# Patient Record
Sex: Male | Born: 1950
Health system: Southern US, Community
[De-identification: ages and names within clinical notes are randomized; demographics above are authoritative.]

## PROBLEM LIST (undated history)

## (undated) DIAGNOSIS — K4090 Unilateral inguinal hernia, without obstruction or gangrene, not specified as recurrent: Secondary | ICD-10-CM

## (undated) DIAGNOSIS — M1990 Unspecified osteoarthritis, unspecified site: Secondary | ICD-10-CM

## (undated) DIAGNOSIS — F329 Major depressive disorder, single episode, unspecified: Secondary | ICD-10-CM

## (undated) DIAGNOSIS — Z87442 Personal history of urinary calculi: Secondary | ICD-10-CM

## (undated) DIAGNOSIS — M791 Myalgia, unspecified site: Secondary | ICD-10-CM

## (undated) DIAGNOSIS — F32A Depression, unspecified: Secondary | ICD-10-CM

## (undated) DIAGNOSIS — J449 Chronic obstructive pulmonary disease, unspecified: Secondary | ICD-10-CM

## (undated) DIAGNOSIS — E785 Hyperlipidemia, unspecified: Secondary | ICD-10-CM

## (undated) DIAGNOSIS — I7300 Raynaud's syndrome without gangrene: Secondary | ICD-10-CM

## (undated) DIAGNOSIS — C801 Malignant (primary) neoplasm, unspecified: Secondary | ICD-10-CM

## (undated) DIAGNOSIS — F419 Anxiety disorder, unspecified: Secondary | ICD-10-CM

## (undated) DIAGNOSIS — G2581 Restless legs syndrome: Secondary | ICD-10-CM

## (undated) DIAGNOSIS — G4700 Insomnia, unspecified: Secondary | ICD-10-CM

## (undated) HISTORY — DX: Insomnia, unspecified: G47.00

## (undated) HISTORY — PX: KNEE SURGERY: SHX244

## (undated) HISTORY — DX: Raynaud's syndrome without gangrene: I73.00

## (undated) HISTORY — DX: Anxiety disorder, unspecified: F41.9

## (undated) HISTORY — DX: Depression, unspecified: F32.A

## (undated) HISTORY — PX: KYPHOPLASTY: SHX5884

## (undated) HISTORY — DX: Hyperlipidemia, unspecified: E78.5

## (undated) HISTORY — DX: Unspecified osteoarthritis, unspecified site: M19.90

## (undated) HISTORY — PX: TONSILLECTOMY: SUR1361

## (undated) HISTORY — PX: COLON SURGERY: SHX602

## (undated) HISTORY — DX: Major depressive disorder, single episode, unspecified: F32.9

## (undated) HISTORY — DX: Myalgia, unspecified site: M79.10

---

## 1977-02-27 DIAGNOSIS — R7611 Nonspecific reaction to tuberculin skin test without active tuberculosis: Secondary | ICD-10-CM

## 1977-02-27 HISTORY — DX: Nonspecific reaction to tuberculin skin test without active tuberculosis: R76.11

## 1978-02-27 DIAGNOSIS — D869 Sarcoidosis, unspecified: Secondary | ICD-10-CM

## 1978-02-27 HISTORY — DX: Sarcoidosis, unspecified: D86.9

## 1978-02-27 HISTORY — PX: AXILLARY LYMPH NODE BIOPSY: SHX5737

## 2006-03-15 ENCOUNTER — Ambulatory Visit (HOSPITAL_COMMUNITY): Admission: RE | Admit: 2006-03-15 | Discharge: 2006-03-15 | Payer: Self-pay | Admitting: Pulmonary Disease

## 2006-04-11 ENCOUNTER — Ambulatory Visit (HOSPITAL_BASED_OUTPATIENT_CLINIC_OR_DEPARTMENT_OTHER): Admission: RE | Admit: 2006-04-11 | Discharge: 2006-04-11 | Payer: Self-pay | Admitting: Orthopedic Surgery

## 2008-04-21 ENCOUNTER — Ambulatory Visit (HOSPITAL_COMMUNITY): Admission: RE | Admit: 2008-04-21 | Discharge: 2008-04-21 | Payer: Self-pay | Admitting: Pulmonary Disease

## 2009-08-13 ENCOUNTER — Ambulatory Visit (HOSPITAL_COMMUNITY): Admission: RE | Admit: 2009-08-13 | Discharge: 2009-08-13 | Payer: Self-pay | Admitting: Pulmonary Disease

## 2009-11-15 ENCOUNTER — Ambulatory Visit (HOSPITAL_COMMUNITY): Admission: RE | Admit: 2009-11-15 | Discharge: 2009-11-15 | Payer: Self-pay | Admitting: Pulmonary Disease

## 2010-06-26 ENCOUNTER — Emergency Department (HOSPITAL_COMMUNITY): Payer: BC Managed Care – PPO

## 2010-06-26 ENCOUNTER — Emergency Department (HOSPITAL_COMMUNITY)
Admission: EM | Admit: 2010-06-26 | Discharge: 2010-06-26 | Disposition: A | Discharge status: Home / Self Care | Payer: BC Managed Care – PPO | Attending: Emergency Medicine | Admitting: Emergency Medicine

## 2010-06-26 DIAGNOSIS — I498 Other specified cardiac arrhythmias: Secondary | ICD-10-CM | POA: Insufficient documentation

## 2010-06-26 DIAGNOSIS — R109 Unspecified abdominal pain: Secondary | ICD-10-CM | POA: Insufficient documentation

## 2010-06-26 DIAGNOSIS — R112 Nausea with vomiting, unspecified: Secondary | ICD-10-CM | POA: Insufficient documentation

## 2010-06-26 DIAGNOSIS — N201 Calculus of ureter: Secondary | ICD-10-CM | POA: Insufficient documentation

## 2010-06-26 LAB — COMPREHENSIVE METABOLIC PANEL
ALT: 28 U/L (ref 0–53)
Albumin: 4 g/dL (ref 3.5–5.2)
Alkaline Phosphatase: 86 U/L (ref 39–117)
BUN: 17 mg/dL (ref 6–23)
CO2: 28 mEq/L (ref 19–32)
Chloride: 100 mEq/L (ref 96–112)
Creatinine, Ser: 0.97 mg/dL (ref 0.4–1.5)
Glucose, Bld: 134 mg/dL — ABNORMAL HIGH (ref 70–99)

## 2010-06-26 LAB — DIFFERENTIAL
Basophils Absolute: 0 10*3/uL (ref 0.0–0.1)
Eosinophils Absolute: 0.1 10*3/uL (ref 0.0–0.7)
Eosinophils Relative: 1 % (ref 0–5)
Monocytes Relative: 8 % (ref 3–12)
Neutrophils Relative %: 71 % (ref 43–77)

## 2010-06-26 LAB — CBC
HCT: 42.6 % (ref 39.0–52.0)
Hemoglobin: 14.5 g/dL (ref 13.0–17.0)
MCH: 28.5 pg (ref 26.0–34.0)
Platelets: 243 10*3/uL (ref 150–400)
RBC: 5.09 MIL/uL (ref 4.22–5.81)
WBC: 10.2 10*3/uL (ref 4.0–10.5)

## 2010-06-26 LAB — URINALYSIS, ROUTINE W REFLEX MICROSCOPIC
Bilirubin Urine: NEGATIVE
Leukocytes, UA: NEGATIVE
Nitrite: NEGATIVE
Protein, ur: NEGATIVE mg/dL
Specific Gravity, Urine: 1.01 (ref 1.005–1.030)

## 2010-06-26 LAB — URINE MICROSCOPIC-ADD ON

## 2010-06-26 LAB — PROTIME-INR: INR: 0.97 (ref 0.00–1.49)

## 2010-06-26 MED ORDER — IOHEXOL 300 MG/ML  SOLN
100.0000 mL | Freq: Once | INTRAMUSCULAR | Status: AC | PRN
  Administered 2010-06-26: 100 mL via INTRAVENOUS

## 2010-06-28 LAB — URINE CULTURE
Colony Count: 2000
Culture  Setup Time: 201204292050

## 2010-07-15 NOTE — Op Note (Signed)
NAMEEUFEMIO, STRAHM                ACCOUNT NO.:  [id number]   MEDICAL RECORD NO.:  [id number]          PATIENT TYPE:  AMB   LOCATION:  DSC                          FACILITY:  [facility]   PHYSICIAN:  Loreta Ave, M.D. DATE OF BIRTH:  1950/10/02   DATE OF PROCEDURE:  04/11/2006  DATE OF DISCHARGE:                               OPERATIVE REPORT   PREOPERATIVE DIAGNOSIS:  Medial meniscus tear, right knee.   POSTOPERATIVE DIAGNOSIS:  Medial and lateral meniscus tear, right knee  with some Grade 2, mild Grade 3 changes lateral femoral condyle.   PROCEDURE:  1. Right knee exam under anesthesia.  2. Arthroscopy.  3. Partial medial and lateral meniscectomy.  4. Chondroplasty, lateral femoral condyle.   SURGEON:  Loreta Ave, M.D.   ASSISTANT:  Genene Churn. Denton Meek.   ANESTHESIA:  General.   BLOOD LOSS:  Minimal.   TOURNIQUET:  None applied.   SPECIMENS:  None.   CULTURES:  None.   COMPLICATIONS:  None.   DRESSING:  Soft compressive.   PROCEDURE:  Patient brought to the operating room, placed on the  operating table in supine position.  After adequate anesthesia had been  obtained, right knee examined.  Full motion, good stability, good  patellofemoral tracking.  Tourniquet let go and not applied.  Leg  prepped and leg draped.  Three portals made, one superolateral, one each  medial and lateral parapatellar.  Inflow catheter induced.  Knee was  extended, arthroscope introduced.  The knee inspected.  __________  cartilage throughout, other than this some mild grade 2, even a little  superficial grade 3 change, weight bearing on the lateral femoral  condyle debrided.  The plateau looked good.  Cruciate ligament was  intact.  Radial tearing midline anterior third lateral meniscus was all  spliced out the tibia then smoothed with basket and shaver.  Marked  complex tearing at entire posterior medial meniscus treated with removal  of the posterior third tapering the  remaining meniscus.  Medial  compartment, no degenerative changes.  At completion all  __________  examined to be sure all loose fragments removed.  Instruments fully  removed.  Portals and knee injected with Marcaine.  Portals closed with  4-0 Nylon.  Sterile compressive dressing applied.  Anesthesia reversed.  Brought to the recovery room.  Tolerated surgery well.  No  complications.      Loreta Ave, M.D.  Electronically Signed     DFM/MEDQ  D:  04/11/2006  T:  04/12/2006  Job:  [id number]

## 2011-01-27 ENCOUNTER — Other Ambulatory Visit: Payer: Self-pay

## 2011-01-27 DIAGNOSIS — Z139 Encounter for screening, unspecified: Secondary | ICD-10-CM

## 2011-01-31 ENCOUNTER — Telehealth: Payer: Self-pay

## 2011-01-31 NOTE — Telephone Encounter (Signed)
Needs office visit given multiple psychoactive meds prior to colonoscopy.

## 2011-01-31 NOTE — Telephone Encounter (Signed)
Gastroenterology Pre-Procedure Form  Request Date: 01/27/2011     Requesting Physician: Dr. Juanetta Gosling     PATIENT INFORMATION:  Albert Gonzalez is a 59 y.o., male (DOB=03-19-1950).  PROCEDURE: Procedure(s) requested: colonoscopy Procedure Reason: screening for colon cancer  PATIENT REVIEW QUESTIONS: The patient reports the following:   1. Diabetes Melitis: no 2. Joint replacements in the past 12 months: no 3. Major health problems in the past 3 months: no 4. Has an artificial valve or MVP:no 5. Has been advised in past to take antibiotics in advance of a procedure like teeth cleaning: no}    MEDICATIONS & ALLERGIES:    Patient reports the following regarding taking any blood thinners:   Plavix? no Aspirin?no Coumadin?  no  Patient confirms/reports the following medications:  Current Outpatient Prescriptions  Medication Sig Dispense Refill  . ALPRAZolam (XANAX) 0.5 MG tablet Take 0.5 mg by mouth at bedtime as needed. Not daily       . desvenlafaxine (PRISTIQ) 50 MG 24 hr tablet Take 50 mg by mouth daily.        Marland Kitchen glucosamine-chondroitin 500-400 MG tablet Take 1 tablet by mouth daily.        Marland Kitchen HYDROcodone-acetaminophen (VICODIN) 5-500 MG per tablet Take 1 tablet by mouth every 6 (six) hours as needed. Pt said he does not take daily       . Multiple Vitamin (MULTIVITAMIN) tablet Take 1 tablet by mouth daily.        . rosuvastatin (CRESTOR) 20 MG tablet Take 20 mg by mouth daily.        Marland Kitchen zolpidem (AMBIEN) 10 MG tablet Take 10 mg by mouth at bedtime as needed. As needed only         Patient confirms/reports the following allergies:  Allergies  Allergen Reactions  . Sulfa Antibiotics Rash    Patient is appropriate to schedule for requested procedure(s): yes  AUTHORIZATION INFORMATION Primary Insurance:  ID #:  Group #:  Pre-Cert / Auth required: Pre-Cert / Auth #:   Secondary Insurance:   ID #:  Group #:  Pre-Cert / Auth required:  Pre-Cert / Auth #:  No orders of the  defined types were placed in this encounter.    SCHEDULE INFORMATION: Procedure has been scheduled as follows:  Date: 02/08/2011     Time: 1:30 PM  Location: [facility] Short Stay  This Gastroenterology Pre-Precedure Form is being routed to the following provider(s) for review: R. Roetta Sessions, MD

## 2011-01-31 NOTE — Telephone Encounter (Signed)
Per Lorenza Burton, NP scheduled pt OV appt with Tana Coast, PA on 02/01/2011 at 10:30 AM. (left the appt in computer for 02/08/11  For the colonoscopy. Pt will just need Rx and instructions.

## 2011-02-01 ENCOUNTER — Encounter: Payer: Self-pay | Admitting: Gastroenterology

## 2011-02-01 ENCOUNTER — Ambulatory Visit (INDEPENDENT_AMBULATORY_CARE_PROVIDER_SITE_OTHER): Payer: BC Managed Care – PPO | Admitting: Gastroenterology

## 2011-02-01 VITALS — BP 95/65 | HR 71 | Temp 97.9°F | Ht 71.0 in | Wt 192.8 lb

## 2011-02-01 DIAGNOSIS — K5900 Constipation, unspecified: Secondary | ICD-10-CM | POA: Insufficient documentation

## 2011-02-01 DIAGNOSIS — R933 Abnormal findings on diagnostic imaging of other parts of digestive tract: Secondary | ICD-10-CM | POA: Insufficient documentation

## 2011-02-01 NOTE — Patient Instructions (Signed)
We have scheduled you for a colonoscopy. Please see separate instructions. 

## 2011-02-01 NOTE — Progress Notes (Signed)
Primary Care Physician:  Fredirick Maudlin, MD  Primary Gastroenterologist:  Roetta Sessions, MD   Chief Complaint  Patient presents with  . Colonoscopy    HPI:  Albert Gonzalez is a 60 y.o. male here to schedule a colonoscopy. In 05/2010, he had CT scan for abdominal pain. Turned out he had kidney stone but on CT he had density in the vicinity of the ileocecal valve, ?stool vs polyp.mass? He had to put off having colonoscopy due to wife's illness.   C/O chronic constipation but well-managed on daily fiber.  BM every 2-3 days, stool soft. No melena, brbpr. Appetite good, no weight loss. Only occasional heartburn. No dysphagia. No abdominal pain. No vomiting. Never had a colonoscopy. No FH of colon cancer.   Current Outpatient Prescriptions  Medication Sig Dispense Refill  . ALPRAZolam (XANAX) 0.5 MG tablet Take 0.5 mg by mouth at bedtime as needed. Not daily       . aspirin 325 MG tablet Take 325 mg by mouth daily.        . cyclobenzaprine (FLEXERIL) 10 MG tablet Take 10 mg by mouth 3 (three) times daily as needed.        . desvenlafaxine (PRISTIQ) 50 MG 24 hr tablet Take 50 mg by mouth daily.        Marland Kitchen glucosamine-chondroitin 500-400 MG tablet Take 1 tablet by mouth daily.        Marland Kitchen HYDROcodone-acetaminophen (VICODIN) 5-500 MG per tablet Take 1 tablet by mouth every 6 (six) hours as needed. Patient states he rarely takes.      . Multiple Vitamin (MULTIVITAMIN) tablet Take 1 tablet by mouth daily.        . rosuvastatin (CRESTOR) 20 MG tablet Take 20 mg by mouth daily.        Marland Kitchen zolpidem (AMBIEN) 10 MG tablet Take 10 mg by mouth at bedtime as needed. As needed only         Allergies as of 02/01/2011 - Review Complete 02/01/2011  Allergen Reaction Noted  . Sulfa antibiotics Rash 01/31/2011    Past Medical History  Diagnosis Date  . Hyperlipidemia   . Anxiety   . Depression   . Insomnia   . Myalgia   . Arthritis     Past Surgical History  Procedure Date  . Tonsillectomy   . Knee  surgery     left    Family History  Problem Relation Age of Onset  . Colon cancer Neg Hx   . Liver disease Neg Hx   . Inflammatory bowel disease Neg Hx     History   Social History  . Marital Status: Married    Spouse Name: N/A    Number of Children: 1  . Years of Education: N/A   Occupational History  . respiratory therapist [employer]   Social History Main Topics  . Smoking status: Former Smoker -- 0.5 packs/day    Types: Pipe  . Smokeless tobacco: Not on file   Comment: quit many years ago  . Alcohol Use: No  . Drug Use: No  . Sexually Active: Not on file   Other Topics Concern  . Not on file   Social History Narrative  . No narrative on file      ROS:  General: Negative for anorexia, weight loss, fever, chills, fatigue, weakness. Eyes: Negative for vision changes.  ENT: Negative for hoarseness, difficulty swallowing , nasal congestion. CV: Negative for chest pain, angina, palpitations, dyspnea on exertion,  peripheral edema.  Respiratory: Negative for dyspnea at rest, dyspnea on exertion, cough, sputum, wheezing.  GI: See history of present illness. GU:  Negative for dysuria, hematuria, urinary incontinence, urinary frequency, nocturnal urination.  MS: Negative for joint pain, low back pain.  Derm: Negative for rash or itching.  Neuro: Negative for weakness, abnormal sensation, seizure, frequent headaches, memory loss, confusion.  Psych: Positive for anxiety, depression but NO suicidal ideation, hallucinations.  Endo: Negative for unusual weight change.  Heme: Negative for bruising or bleeding. Allergy: Negative for rash or hives.    Physical Examination:  BP 95/65  Pulse 71  Temp(Src) 97.9 F (36.6 C) (Temporal)  Ht 5\' 11"  (1.803 m)  Wt 192 lb 12.8 oz (87.454 kg)  BMI 26.89 kg/m2   General: Well-nourished, well-developed in no acute distress.  Head: Normocephalic, atraumatic.   Eyes: Conjunctiva pink, no icterus. Mouth:  Oropharyngeal mucosa moist and pink , no lesions erythema or exudate. Neck: Supple without thyromegaly, masses, or lymphadenopathy.  Lungs: Clear to auscultation bilaterally.  Heart: Regular rate and rhythm, no murmurs rubs or gallops.  Abdomen: Bowel sounds are normal, nontender, nondistended, no hepatosplenomegaly or masses, no abdominal bruits or    hernia , no rebound or guarding.   Rectal: Not performed. Extremities: No lower extremity edema. No clubbing or deformities.  Neuro: Alert and oriented x 4 , grossly normal neurologically.  Skin: Warm and dry, no rash or jaundice.   Psych: Alert and cooperative, normal mood and affect.

## 2011-02-02 NOTE — Assessment & Plan Note (Signed)
Continue daily fiber. If needed, can add Miralax 17 grams daily prn.

## 2011-02-02 NOTE — Progress Notes (Signed)
Cc to PCP 

## 2011-02-02 NOTE — Assessment & Plan Note (Signed)
Chronic constipation managed on daily fiber. CT abd/pelvis in 05/2010 showed ?abnormality at ileocecal valve. Patient to undergo colonoscopy for further evaluation.  I have discussed the risks, alternatives, benefits with regards to but not limited to the risk of reaction to medication, bleeding, infection, perforation and the patient is agreeable to proceed. Written consent to be obtained.  Please note. After inquiring multiple times, patient states he rarely takes Vicodin. He takes one Xanax daily and Prestiq once daily. Trial of conscious sedation as per discussion with patient.

## 2011-02-03 NOTE — Progress Notes (Signed)
DISCUSS W/ DR. [name].  REVIEWED.

## 2011-02-06 ENCOUNTER — Encounter (HOSPITAL_COMMUNITY): Payer: Self-pay | Admitting: Pharmacy Technician

## 2011-02-07 MED ORDER — SODIUM CHLORIDE 0.45 % IV SOLN
Freq: Once | INTRAVENOUS | Status: AC
  Administered 2011-02-08: 1000 mL via INTRAVENOUS

## 2011-02-08 ENCOUNTER — Encounter (HOSPITAL_COMMUNITY): Payer: Self-pay | Admitting: *Deleted

## 2011-02-08 ENCOUNTER — Ambulatory Visit (HOSPITAL_COMMUNITY)
Admission: RE | Admit: 2011-02-08 | Discharge: 2011-02-08 | Disposition: A | Discharge status: Home / Self Care | Payer: BC Managed Care – PPO | Source: Ambulatory Visit | Attending: Internal Medicine | Admitting: Internal Medicine

## 2011-02-08 ENCOUNTER — Other Ambulatory Visit: Payer: Self-pay | Admitting: Internal Medicine

## 2011-02-08 ENCOUNTER — Encounter (HOSPITAL_COMMUNITY)
Admission: RE | Disposition: A | Discharge status: Home / Self Care | Payer: Self-pay | Source: Ambulatory Visit | Attending: Internal Medicine

## 2011-02-08 DIAGNOSIS — Z1211 Encounter for screening for malignant neoplasm of colon: Secondary | ICD-10-CM | POA: Insufficient documentation

## 2011-02-08 DIAGNOSIS — K5730 Diverticulosis of large intestine without perforation or abscess without bleeding: Secondary | ICD-10-CM

## 2011-02-08 DIAGNOSIS — D126 Benign neoplasm of colon, unspecified: Secondary | ICD-10-CM

## 2011-02-08 DIAGNOSIS — R933 Abnormal findings on diagnostic imaging of other parts of digestive tract: Secondary | ICD-10-CM

## 2011-02-08 DIAGNOSIS — Z139 Encounter for screening, unspecified: Secondary | ICD-10-CM

## 2011-02-08 DIAGNOSIS — E785 Hyperlipidemia, unspecified: Secondary | ICD-10-CM | POA: Insufficient documentation

## 2011-02-08 DIAGNOSIS — Z7982 Long term (current) use of aspirin: Secondary | ICD-10-CM | POA: Insufficient documentation

## 2011-02-08 HISTORY — PX: COLONOSCOPY: SHX5424

## 2011-02-08 SURGERY — COLONOSCOPY
Anesthesia: Moderate Sedation

## 2011-02-08 MED ORDER — MIDAZOLAM HCL 5 MG/5ML IJ SOLN
INTRAMUSCULAR | Status: DC | PRN
  Administered 2011-02-08 (×2): 2 mg via INTRAVENOUS
  Administered 2011-02-08: 1 mg via INTRAVENOUS

## 2011-02-08 MED ORDER — MEPERIDINE HCL 100 MG/ML IJ SOLN
INTRAMUSCULAR | Status: AC
  Filled 2011-02-08: qty 2

## 2011-02-08 MED ORDER — STERILE WATER FOR IRRIGATION IR SOLN
Status: DC | PRN
  Administered 2011-02-08: 14:00:00

## 2011-02-08 MED ORDER — MIDAZOLAM HCL 5 MG/5ML IJ SOLN
INTRAMUSCULAR | Status: AC
  Filled 2011-02-08: qty 10

## 2011-02-08 MED ORDER — MEPERIDINE HCL 100 MG/ML IJ SOLN
INTRAMUSCULAR | Status: DC | PRN
  Administered 2011-02-08 (×2): 50 mg via INTRAVENOUS
  Administered 2011-02-08: 25 mg via INTRAVENOUS

## 2011-02-08 NOTE — H&P (Addendum)
  I have seen & examined the patient prior to the procedure(s) today and reviewed the history and physical/consultation.  Patient was perceived to pass more blood per rectum last night and presented to the ED. In fact, hemocult negative on DRE per Dr. Lynelle Doctor last night.  H&H remained stable through today -- H&H today 12.6/  39.9 and  INR 1.26 this affternoon. No further blood per rectum with remainder of prep this morning. Otherwise, there have been no changes.  After consideration of the risks, benefits, alternatives and imponderables, the patient has consented to the procedure(s).

## 2011-02-08 NOTE — Op Note (Signed)
[facility] [address] [place], [place]  [address]  COLONOSCOPY PROCEDURE REPORT  PATIENT:  Albert Gonzalez, Albert Gonzalez  MR#:  [id number] BIRTHDATE:  1950/04/12, 60 yrs. old  GENDER:  male ENDOSCOPIST:  R. Roetta Sessions, MD FACP [facility] REF. BY:           Dr. Juanetta Gosling PROCEDURE DATE:  02/08/2011 PROCEDURE:   ileocolonoscopy with snare polypectomy  INDICATIONS:   First-ever colonoscopy/abnormal ileocecal valve on CT scan  INFORMED CONSENT:  The risks, benefits, alternatives and imponderables including but not limited to bleeding, perforation as well as the possibility of a missed lesion have been reviewed. The potential for biopsy, lesion removal, etc. have also been discussed.  Questions have been answered.  All parties agreeable. Please see the history and physical in the medical record for more information.  MEDICATIONS:   Versed 5 mg IV and Demerol 25 mg IV in divided doses  DESCRIPTION OF PROCEDURE:  After a digital rectal exam was performed, the EC-[id number] ([id number]) colonoscope was advanced from the anus through the rectum and colon to the area of the cecum, ileocecal valve and appendiceal orifice.  The cecum was deeply intubated.  These structures were well-seen and photographed for the record.  From the level of the cecum and ileocecal valve, the scope was slowly and cautiously withdrawn.  The mucosal surfaces were carefully surveyed utilizing scope tip deflection to facilitate fold flattening as needed.  The scope was pulled down into the rectum where a thorough examination including retroflexion was performed. <<PROCEDUREIMAGES>>  FINDINGS: good preparation. shallow sigmoid diverticula; 6 mm pedunculated polyp in the mid descending colon ; otherwise remainder of colonic         mucosa appeared normal. Normal distal 5 cm of terminal ileum ;normal rectal mucosa.  THERAPEUTIC / DIAGNOSTIC MANEUVERS PERFORMED:  the  descending colon polyp identified above was  hot snare  removed  COMPLICATIONS:   none  CECAL WITHDRAWAL TIME: 13 minutes  IMPRESSION: Single colonic polyp-removed as described above. sigmoid diverticulosis ; abnormality on CT scan most likely artifact  RECOMMENDATIONS: Follow up on pathology  ______________________________ R. Roetta Sessions, MD Caleen Essex  CC:  Shaune Pollack, MD  n. eSIGNED:   R. Roetta Sessions at 02/08/2011 02:51 PM  Tillman Sers, [phone]

## 2011-02-08 NOTE — H&P (Signed)
  I have seen & examined the patient prior to the procedure(s) today and reviewed the history and physical/consultation.  There have been no changes.  After consideration of the risks, benefits, alternatives and imponderables, the patient has consented to the procedure(s).   

## 2011-02-11 ENCOUNTER — Encounter: Payer: Self-pay | Admitting: Internal Medicine

## 2011-02-23 ENCOUNTER — Encounter (HOSPITAL_COMMUNITY): Payer: Self-pay | Admitting: Internal Medicine

## 2014-06-30 ENCOUNTER — Other Ambulatory Visit (HOSPITAL_COMMUNITY): Payer: Self-pay | Admitting: Pulmonary Disease

## 2014-06-30 ENCOUNTER — Ambulatory Visit (HOSPITAL_COMMUNITY)
Admission: RE | Admit: 2014-06-30 | Discharge: 2014-06-30 | Disposition: A | Discharge status: Home / Self Care | Payer: Medicare Other | Source: Ambulatory Visit | Attending: Pulmonary Disease | Admitting: Pulmonary Disease

## 2014-06-30 DIAGNOSIS — M25562 Pain in left knee: Secondary | ICD-10-CM | POA: Diagnosis present

## 2014-06-30 DIAGNOSIS — F413 Other mixed anxiety disorders: Secondary | ICD-10-CM | POA: Diagnosis not present

## 2014-06-30 DIAGNOSIS — M1990 Unspecified osteoarthritis, unspecified site: Secondary | ICD-10-CM | POA: Diagnosis not present

## 2014-06-30 DIAGNOSIS — S8992XA Unspecified injury of left lower leg, initial encounter: Secondary | ICD-10-CM | POA: Insufficient documentation

## 2014-06-30 DIAGNOSIS — W010XXA Fall on same level from slipping, tripping and stumbling without subsequent striking against object, initial encounter: Secondary | ICD-10-CM | POA: Diagnosis not present

## 2014-06-30 DIAGNOSIS — M1712 Unilateral primary osteoarthritis, left knee: Secondary | ICD-10-CM | POA: Diagnosis not present

## 2014-06-30 DIAGNOSIS — R269 Unspecified abnormalities of gait and mobility: Secondary | ICD-10-CM | POA: Diagnosis not present

## 2014-07-21 DIAGNOSIS — M1990 Unspecified osteoarthritis, unspecified site: Secondary | ICD-10-CM | POA: Diagnosis not present

## 2014-07-21 DIAGNOSIS — F419 Anxiety disorder, unspecified: Secondary | ICD-10-CM | POA: Diagnosis not present

## 2014-08-10 ENCOUNTER — Other Ambulatory Visit: Payer: Self-pay | Admitting: Orthopedic Surgery

## 2014-08-10 ENCOUNTER — Ambulatory Visit (HOSPITAL_COMMUNITY)
Admission: RE | Admit: 2014-08-10 | Discharge: 2014-08-10 | Disposition: A | Discharge status: Home / Self Care | Payer: Medicare Other | Source: Ambulatory Visit | Attending: Orthopedic Surgery | Admitting: Orthopedic Surgery

## 2014-08-10 DIAGNOSIS — M25561 Pain in right knee: Secondary | ICD-10-CM | POA: Insufficient documentation

## 2014-08-10 DIAGNOSIS — M5146 Schmorl's nodes, lumbar region: Secondary | ICD-10-CM | POA: Insufficient documentation

## 2014-08-10 DIAGNOSIS — M25562 Pain in left knee: Secondary | ICD-10-CM | POA: Insufficient documentation

## 2014-08-10 DIAGNOSIS — M1711 Unilateral primary osteoarthritis, right knee: Secondary | ICD-10-CM | POA: Diagnosis not present

## 2014-08-10 DIAGNOSIS — M47817 Spondylosis without myelopathy or radiculopathy, lumbosacral region: Secondary | ICD-10-CM | POA: Diagnosis not present

## 2014-08-10 DIAGNOSIS — M545 Low back pain: Secondary | ICD-10-CM | POA: Diagnosis not present

## 2014-08-13 ENCOUNTER — Encounter: Payer: Self-pay | Admitting: Orthopedic Surgery

## 2014-08-13 ENCOUNTER — Ambulatory Visit (INDEPENDENT_AMBULATORY_CARE_PROVIDER_SITE_OTHER): Payer: Medicare Other | Admitting: Orthopedic Surgery

## 2014-08-13 VITALS — BP 106/70 | Ht 71.0 in | Wt 192.0 lb

## 2014-08-13 DIAGNOSIS — M5136 Other intervertebral disc degeneration, lumbar region: Secondary | ICD-10-CM | POA: Diagnosis not present

## 2014-08-13 DIAGNOSIS — M129 Arthropathy, unspecified: Secondary | ICD-10-CM

## 2014-08-13 DIAGNOSIS — M170 Bilateral primary osteoarthritis of knee: Secondary | ICD-10-CM

## 2014-08-13 MED ORDER — DICLOFENAC POTASSIUM 50 MG PO TABS: 50.0000 mg | ORAL_TABLET | Freq: Two times a day (BID) | ORAL | Status: DC

## 2014-08-13 NOTE — Progress Notes (Signed)
Patient ID: Albert Gonzalez, male   DOB: [birth year], 64 y.o.   MRN: [id number] New Patient   Chief Complaint  Patient presents with  . Joint Swelling    Bilateral knee pain, referred by Dr. Luan Pulling for consult and treat.     Albert Gonzalez is a 64 y.o. male.   HPI 64 year old male presents with bilateral knee pain and recently new-onset right lower back pain  Recently retired says his knee pain is gotten worse although he's had it for several years. He was treated with sterilely Dosepak and Vicodin but only uses Vicodin and has severe pain. He notes pain at night when he is lying down.  He hasn't really lost any motion doesn't have any stiffness has not had any injury although he had a left knee arthroscopy in 2005 in [place]  His pain is described as dull aching worse after activity usually a 5 can be up to 9 or 10. He does not have catching locking giving way or mechanical symptoms  He does have burning pain in his legs at times his other review of systems was normal    He denies numbness or tingling in the leg Review of Systems See hpi  Past Medical History  Diagnosis Date  . Hyperlipidemia   . Anxiety   . Depression   . Insomnia   . Myalgia   . Arthritis     Past Surgical History  Procedure Laterality Date  . Tonsillectomy    . Knee surgery      left  . Colonoscopy  02/08/2011    Procedure: COLONOSCOPY;  Surgeon: Daneil Dolin, MD;  Location: [facility];  Service: Endoscopy;  Laterality: N/A;  1:30 PM    Family History  Problem Relation Age of Onset  . Colon cancer Neg Hx   . Liver disease Neg Hx   . Inflammatory bowel disease Neg Hx   . Anesthesia problems Neg Hx     Social History History  Substance Use Topics  . Smoking status: Former Smoker -- 0.50 packs/day for 20 years    Types: Pipe  . Smokeless tobacco: Not on file     Comment: quit many years ago  . Alcohol Use: Yes     Comment: occassional    Allergies  Allergen Reactions  . Sulfa  Antibiotics Rash    Current Outpatient Prescriptions  Medication Sig Dispense Refill  . ALPRAZolam (XANAX) 0.5 MG tablet Take 0.5 mg by mouth at bedtime as needed. For sleep. Does not daily    . aspirin 325 MG tablet Take 325 mg by mouth daily.      . cyclobenzaprine (FLEXERIL) 10 MG tablet Take 10 mg by mouth 3 (three) times daily as needed. For muscle spasms    . desvenlafaxine (PRISTIQ) 50 MG 24 hr tablet Take 50 mg by mouth daily.      . diclofenac (CATAFLAM) 50 MG tablet Take 1 tablet (50 mg total) by mouth 2 (two) times daily. 60 tablet 2  . glucosamine-chondroitin 500-400 MG tablet Take 1 tablet by mouth daily.      Marland Kitchen HYDROcodone-acetaminophen (VICODIN) 5-500 MG per tablet Take 1 tablet by mouth every 6 (six) hours as needed. For leg pain    . ibuprofen (ADVIL,MOTRIN) 200 MG tablet Take 800 mg by mouth every 6 (six) hours as needed. For pain     . Multiple Vitamin (MULTIVITAMIN) tablet Take 1 tablet by mouth daily.      . psyllium (METAMUCIL SMOOTH  TEXTURE) 28 % packet Take 1 packet by mouth daily.      . rosuvastatin (CRESTOR) 20 MG tablet Take 20 mg by mouth daily.      Marland Kitchen zolpidem (AMBIEN) 10 MG tablet Take 10 mg by mouth at bedtime as needed. For sleep     No current facility-administered medications for this visit.       Physical Exam Blood pressure 106/70, height 5\' 11"  (1.803 m), weight 192 lb (87.091 kg). Physical Exam The patient is well developed well nourished and well groomed. Orientation to person place and time is normal  Mood is pleasant. He walks in with no abnormalities in his gait he stands normally Upper extremity evaluation inspection reveals no abnormalities in either arm range of motion is full stability and strength tests are normal neurovascular exam is intact   His knees have excellent knee flexion especially on the left which is full with no effusion tenderness instability or motor weakness  He does have some lateral joint line pain on the right and  some posterior pain in the right knee as well and this is the side that he has back pain.?.  He has no effusion in the right knee he has some tightness at terminal flexion he has full extension his knee is stable motor exam is normal  His lower extremities have normal skin sensation and vascularity with 2+ reflexes at the knee and negative straight leg raises  He does have tenderness in his lower back and right lower back   Data Reviewed His x-rays were done at the Hospital I'm interpreting image #1 or series #1 lumbar spine x-rays show mild degenerative disc disease  Left knee mild arthritis right knee mild arthritis    Assessment Encounter Diagnoses  Name Primary?  . DDD (degenerative disc disease), lumbar Yes  . Arthritis of both knees     Plan I injected both knees I put him on diclofenac 50 twice a day and see him back again in 3 months  physical therapy for lumbar stabilization

## 2014-08-13 NOTE — Patient Instructions (Addendum)
Call [facility] therapy dept to schedule PT New med sent to your pharmacy  Joint Injection Care After Refer to this sheet in the next few days. These instructions provide you with information on caring for yourself after you have had a joint injection. Your caregiver also may give you more specific instructions. Your treatment has been planned according to current medical practices, but problems sometimes occur. Call your caregiver if you have any problems or questions after your procedure. After any type of joint injection, it is not uncommon to experience:  Soreness, swelling, or bruising around the injection site.  Mild numbness, tingling, or weakness around the injection site caused by the numbing medicine used before or with the injection. It also is possible to experience the following effects associated with the specific agent after injection:  Iodine-based contrast agents:  Allergic reaction (itching, hives, widespread redness, and swelling beyond the injection site).  Corticosteroids (These effects are rare.):  Allergic reaction.  Increased blood sugar levels (If you have diabetes and you notice that your blood sugar levels have increased, notify your caregiver).  Increased blood pressure levels.  Mood swings.  Hyaluronic acid in the use of viscosupplementation.  Temporary heat or redness.  Temporary rash and itching.  Increased fluid accumulation in the injected joint. These effects all should resolve within a day after your procedure.  HOME CARE INSTRUCTIONS  Limit yourself to light activity the day of your procedure. Avoid lifting heavy objects, bending, stooping, or twisting.  Take prescription or over-the-counter pain medication as directed by your caregiver.  You may apply ice to your injection site to reduce pain and swelling the day of your procedure. Ice may be applied 03-04 times:  Put ice in a plastic bag.  Place a towel between your skin and the  bag.  Leave the ice on for no longer than 15-20 minutes each time. SEEK IMMEDIATE MEDICAL CARE IF:   Pain and swelling get worse rather than better or extend beyond the injection site.  Numbness does not go away.  Blood or fluid continues to leak from the injection site.  You have chest pain.  You have swelling of your face or tongue.  You have trouble breathing or you become dizzy.  You develop a fever, chills, or severe tenderness at the injection site that last longer than 1 day. MAKE SURE YOU:  Understand these instructions.  Watch your condition.  Get help right away if you are not doing well or if you get worse. Document Released: 10/27/2010 Document Revised: 05/08/2011 Document Reviewed: 10/27/2010 [facility] Patient Information 2015 [place], [place]. This information is not intended to replace advice given to you by your health care provider. Make sure you discuss any questions you have with your health care provider.

## 2014-08-20 ENCOUNTER — Ambulatory Visit (HOSPITAL_COMMUNITY): Payer: Medicare Other | Admitting: Physical Therapy

## 2014-08-24 ENCOUNTER — Ambulatory Visit (HOSPITAL_COMMUNITY): Payer: Medicare Other | Attending: Orthopedic Surgery | Admitting: Physical Therapy

## 2014-08-24 DIAGNOSIS — M6281 Muscle weakness (generalized): Secondary | ICD-10-CM | POA: Insufficient documentation

## 2014-08-24 DIAGNOSIS — M6289 Other specified disorders of muscle: Secondary | ICD-10-CM | POA: Diagnosis not present

## 2014-08-24 DIAGNOSIS — R2689 Other abnormalities of gait and mobility: Secondary | ICD-10-CM

## 2014-08-24 DIAGNOSIS — R29818 Other symptoms and signs involving the nervous system: Secondary | ICD-10-CM | POA: Diagnosis not present

## 2014-08-24 NOTE — Therapy (Signed)
[name], [place], [address] Phone: [phone]   Fax:  [phone]  Physical Therapy Evaluation  Patient Details  Name: SHAHZAD Gonzalez MRN: [id number] Date of Birth: [birth year] Referring Provider:  Carole Civil, MD  Encounter Date: 08/24/2014      PT End of Session - 08/24/14 1701    Visit Number 1   Number of Visits 5   Date for PT Re-Evaluation 09/23/14   Authorization Type Medicare   PT Start Time 0931   PT Stop Time 1016   PT Time Calculation (min) 45 min   Activity Tolerance Patient tolerated treatment well   Behavior During Therapy [facility] for tasks assessed/performed      Past Medical History  Diagnosis Date  . Hyperlipidemia   . Anxiety   . Depression   . Insomnia   . Myalgia   . Arthritis     Past Surgical History  Procedure Laterality Date  . Tonsillectomy    . Knee surgery      left  . Colonoscopy  02/08/2011    Procedure: COLONOSCOPY;  Surgeon: Daneil Dolin, MD;  [facility];  Service: Endoscopy;  Laterality: N/A;  1:30 PM    There were no vitals filed for this visit.  Visit Diagnosis:  Proximal muscle weakness  Balance problem  Muscle weakness of lower extremity      Subjective Assessment - 08/24/14 1648    Subjective Pt reports that for the past 2 months, he has had pain in bilateral knees and his back. He recently saw his physician, who prescribed him a new arthritis medication that has significanlty decreased his pain. He now would like to participate in therapy in order to regain his strength, improve his balance, and prevent any further pain from occuring   Pertinent History Pt was experiencing back and knee pain for several months, was taking prednisone, which was not helping his condition. He is now taking a potassium tablet that has decreased his pain.    How long can you sit comfortably? no pain   How long can you stand comfortably? no pain    How  long can you walk comfortably? no pain   Currently in Pain? No/denies            [facility] PT Assessment - 08/24/14 0001    Assessment   Medical Diagnosis DDD   Prior Therapy No   Precautions   Precautions None   Restrictions   Weight Bearing Restrictions No   Balance Screen   Has the patient fallen in the past 6 months Yes   How many times? 5  4 times while outside doing yardwork, 1 time in the home   Has the patient had a decrease in activity level because of a fear of falling?  No   Is the patient reluctant to leave their home because of a fear of falling?  No   Prior Function   Level of Independence Independent   Vocation Retired   Biomedical scientist retired respiratory therapist   Cognition   Overall Cognitive Status Within Functional Limits for tasks assessed   Observation/Other Assessments   Focus on Therapeutic Outcomes (FOTO)  89   Functional Tests   Functional tests Sit to Stand;Single leg stance   Single Leg Stance   Comments L:16 seconds, R: 7 seconds   Sit to Stand   Comments 30 second chair rise: 10 repetitions   ROM / Strength  AROM / PROM / Strength PROM;Strength   PROM   PROM Assessment Site Hip;Knee;Lumbar   Right/Left Hip Right;Left   Right Hip External Rotation  32   Right Hip Internal Rotation  33   Left Hip External Rotation  31   Left Hip Internal Rotation  36   Right/Left Knee --   Lumbar Flexion 91   Lumbar Extension 30   Lumbar - Right Side Bend 25   Lumbar - Left Side Bend 25   Strength   Strength Assessment Site Hip;Knee;Ankle   Right/Left Hip Right;Left   Right Hip Flexion 4-/5   Right Hip Extension 4-/5   Right Hip ABduction 4/5   Left Hip Flexion 4-/5   Left Hip ABduction 4/5   Right/Left Knee Right;Left   Right Knee Flexion 4/5   Right Knee Extension 4/5   Left Knee Flexion 4/5   Left Knee Extension 4/5   Right/Left Ankle Right;Left   Right Ankle Dorsiflexion 4-/5   Left Ankle Dorsiflexion 4-/5   Special Tests     Special Tests --   Lumbar Tests Straight Leg Raise   Straight Leg Raise   Findings Negative   Comment Tightness of bilateral hamstrings: L 67 degrees, R 71 degrees             PT Education - 08/24/14 1658    Education provided Yes   Education Details HEP, POC   Person(s) Educated Patient   Methods Explanation;Handout   Comprehension Verbalized understanding;Returned demonstration          PT Short Term Goals - 08/24/14 1708    PT SHORT TERM GOAL #1   Title Pt will be independent with HEP.    Time 2   Period Weeks   Status New   PT SHORT TERM GOAL #2   Title Pt will demonstrate 4+/5 BLE strength grossly to improve functional mobility and return pt to PLOF.    Time 2   Period Weeks   Status New   PT SHORT TERM GOAL #3   Title Pt will maintain SLS x 45 seconds on BLE to decrease risk for falls.    Time 2   Period Weeks   Status New           PT Long Term Goals - 08/24/14 1709    PT LONG TERM GOAL #1   Title Pt will be independent with advanced HEP for BLE and core strengthening.    Time 4   Period Weeks   Status New   PT LONG TERM GOAL #2   Title Pt will demonstrate 5/5 strength of BLE grossly to improve functional mobility and return pt to PLOF.    Time 4   Period Weeks   Status New   PT LONG TERM GOAL #3   Title Pt will maintain SLS x 60 seconds on BLE to decrease risk for falls.    Time 4   Period Weeks   Status New   PT LONG TERM GOAL #4   Title Pt will be consistent with a walking program to improve health habits.    Time 4   Period Weeks   Status New               Plan - 08/24/14 1702    Clinical Impression Statement Pt presents to PT with deficits in BLE strength, core strength, and balance. He will benefit from 4-5 treatment sessions to provide him with education regarding body mechanics and core activiation and  to provide him with HEP for BLE and core strengthening.  Improving his core strength will reduce the effects of DDD on his  lifestyle, which will improve quality of life, decrease burden on caregivers, and improve function.    Pt will benefit from skilled therapeutic intervention in order to improve on the following deficits Decreased balance;Decreased endurance;Decreased strength;Improper body mechanics   Rehab Potential Excellent   PT Frequency 1x / week   PT Duration 4 weeks   PT Treatment/Interventions Therapeutic activities;Therapeutic exercise;Balance training;Neuromuscular re-education;Patient/family education;Manual techniques   PT Next Visit Plan Progress HEP to include more advance BLE and core strengthening   PT Home Exercise Plan Given for bridging, sidelying hip abduction, and SLS   Consulted and Agree with Plan of Care Patient          G-Codes - September 14, 2014 1707    Functional Assessment Tool Used FOTO   Functional Limitation Changing and maintaining body position   Changing and Maintaining Body Position Current Status (V3710) At least 1 percent but less than 20 percent impaired, limited or restricted   Changing and Maintaining Body Position Goal Status (G2694) At least 1 percent but less than 20 percent impaired, limited or restricted       Problem List Patient Active Problem List   Diagnosis Date Noted  . Abnormal CT scan, colon 02/01/2011  . Constipation 02/01/2011    Hilma Favors, PT, DPT [phone] 09-14-2014, 5:13 PM  [place] [address] [place], [place], [address] Phone: [phone]   Fax:  [phone]

## 2014-08-24 NOTE — Patient Instructions (Signed)
Bridging   Slowly raise buttocks from floor, keeping stomach tight. Repeat __15_ times per set. Do _2___ sets per session. Do _1-2___ sessions per day.  http://orth.exer.us/1096   Copyright  VHI. All rights reserved.  Strengthening: Hip Abduction (Side-Lying)   Tighten muscles on front of left thigh, then lift leg _15-18___ inches from surface, keeping knee locked.  Repeat __10__ times per set. Do __2__ sets per session. Do _1-2___ sessions per day.  http://orth.exer.us/622   Copyright  VHI. All rights reserved.  SINGLE LIMB STANCE   Stance: single leg on floor. Raise leg. Hold 15-45 seconds. Repeat with other leg. __3_ reps per set, _1-2__ sets per day, _7__ days per week  Copyright  VHI. All rights reserved.

## 2014-08-26 ENCOUNTER — Encounter (HOSPITAL_COMMUNITY): Payer: PRIVATE HEALTH INSURANCE | Admitting: Physical Therapy

## 2014-09-02 ENCOUNTER — Ambulatory Visit (HOSPITAL_COMMUNITY): Payer: Medicare Other | Attending: Orthopedic Surgery | Admitting: Physical Therapy

## 2014-09-02 DIAGNOSIS — M6281 Muscle weakness (generalized): Secondary | ICD-10-CM | POA: Insufficient documentation

## 2014-09-02 DIAGNOSIS — R29818 Other symptoms and signs involving the nervous system: Secondary | ICD-10-CM | POA: Diagnosis not present

## 2014-09-02 DIAGNOSIS — M6289 Other specified disorders of muscle: Secondary | ICD-10-CM | POA: Insufficient documentation

## 2014-09-02 DIAGNOSIS — R2689 Other abnormalities of gait and mobility: Secondary | ICD-10-CM

## 2014-09-02 NOTE — Therapy (Signed)
[place], [place], [address] Phone: [phone]   Fax:  [phone]  Physical Therapy Treatment  Patient Details  Name: Albert Gonzalez MRN: [id number] Date of Birth: 14-Jan-1951 Referring Provider:  Carole Civil, MD  Encounter Date: 09/02/2014      PT End of Session - 09/02/14 1017    Visit Number 2   Number of Visits 5   Date for PT Re-Evaluation 09/23/14   Authorization Type Medicare   PT Start Time 0930   PT Stop Time 1016   PT Time Calculation (min) 46 min   Activity Tolerance Patient tolerated treatment well   Behavior During Therapy [facility] for tasks assessed/performed      Past Medical History  Diagnosis Date  . Hyperlipidemia   . Anxiety   . Depression   . Insomnia   . Myalgia   . Arthritis     Past Surgical History  Procedure Laterality Date  . Tonsillectomy    . Knee surgery      left  . Colonoscopy  02/08/2011    Procedure: COLONOSCOPY;  Surgeon: Daneil Dolin, MD;  [facility];  Service: Endoscopy;  Laterality: N/A;  1:30 PM    There were no vitals filed for this visit.  Visit Diagnosis:  Proximal muscle weakness  Balance problem  Muscle weakness of lower extremity      Subjective Assessment - 09/02/14 0934    Subjective Pt reports that he has been compliant with his HEP since the initial evaluation. He has been able to increase the sets of exercises as he has gotten stronger. He reports that he had some slight back pain at first, but it now feels better.    Currently in Pain? No/denies              [facility] Adult PT Treatment/Exercise - 09/02/14 0001    Exercises   Exercises Knee/Hip   Knee/Hip Exercises: Stretches   Active Hamstring Stretch 3 reps;30 seconds   Active Hamstring Stretch Limitations at 12 inch step   Knee/Hip Exercises: Aerobic   Stationary Bike Nustep level 3 x 7 minutes   Knee/Hip Exercises: Standing   Hip ADduction 2 sets;10  reps;Strengthening   Hip ADduction Limitations with ER/extension   Forward Step Up 10 reps;Step Height: 6"   Functional Squat 10 reps   Functional Squat Limitations at chair for tactile cueing   Knee/Hip Exercises: Supine   Single Leg Bridge Both;2 sets;10 reps   Straight Leg Raises Both;15 reps   Knee/Hip Exercises: Sidelying   Hip ABduction 15 reps;Both;2 sets             Balance Exercises - 09/02/14 0959    Balance Exercises: Standing   SLS 3 reps;30 secs;Upper extremity support 1   Marching Limitations x30 on foam           PT Education - 09/02/14 1017    Education provided Yes   Education Details Progressed HEP   Person(s) Educated Patient   Methods Explanation;Handout   Comprehension Verbalized understanding;Returned demonstration          PT Short Term Goals - 08/24/14 1708    PT SHORT TERM GOAL #1   Title Pt will be independent with HEP.    Time 2   Period Weeks   Status New   PT SHORT TERM GOAL #2   Title Pt will demonstrate 4+/5 BLE strength grossly to improve functional mobility and return pt  to PLOF.    Time 2   Period Weeks   Status New   PT SHORT TERM GOAL #3   Title Pt will maintain SLS x 45 seconds on BLE to decrease risk for falls.    Time 2   Period Weeks   Status New           PT Long Term Goals - 08/24/14 1709    PT LONG TERM GOAL #1   Title Pt will be independent with advanced HEP for BLE and core strengthening.    Time 4   Period Weeks   Status New   PT LONG TERM GOAL #2   Title Pt will demonstrate 5/5 strength of BLE grossly to improve functional mobility and return pt to PLOF.    Time 4   Period Weeks   Status New   PT LONG TERM GOAL #3   Title Pt will maintain SLS x 60 seconds on BLE to decrease risk for falls.    Time 4   Period Weeks   Status New   PT LONG TERM GOAL #4   Title Pt will be consistent with a walking program to improve health habits.    Time 4   Period Weeks   Status New                Plan - 09/02/14 1018    Clinical Impression Statement Pt responded well to progression of BLE strengthening in today's treatment session. He denied any knee or back pain during any therex today.    PT Next Visit Plan Continue to progress BLE strengthening and balance activities        Problem List Patient Active Problem List   Diagnosis Date Noted  . Abnormal CT scan, colon 02/01/2011  . Constipation 02/01/2011    Hilma Favors, PT, DPT [phone] 09/02/2014, 12:56 PM  [facility] [address] [place], [place], [address] Phone: [phone]   Fax:  [phone]

## 2014-09-02 NOTE — Patient Instructions (Addendum)
Half Squat to Chair   Stand with feet shoulder width apart. Push buttocks backward and lower slowly, touching chair lightly and returning to standing position. Complete _2_ sets of _10_ repetitions. Perform 1-2___ sessions per day.  http://gtsc.exer.us/436   Copyright  VHI. All rights reserved.  Step-Up: Forward   Leading with right leg, bring both feet onto _6-7_ inch step. Return to starting position, leading with left leg. Repeat _10___ times per session. Do _1-2___ sessions per day.   Copyright  VHI. All rights reserved.  Step-Up: Lateral   Step up to side with right leg. Bring other foot up onto _6-7_ inch step. Return to floor position with left leg. Repeat _10___ times per session. Do _1-2___ sessions per day.   Copyright  VHI. All rights reserved.  Bridging (Single Leg)   Lie on back with feet shoulder width apart and right leg straight. Lift hips toward the ceiling while keeping leg straight. Hold __2__ seconds. Repeat _10___ times. Do __1-2__ sessions per day.  http://gt2.exer.us/358   Copyright  VHI. All rights reserved.  Straight Leg Raise   Tighten stomach and slowly raise locked right leg __15-18__ inches from floor. Repeat _15___ times per set. Do _2___ sets per session. Do __1-2__ sessions per day.  http://orth.exer.us/1102   Copyright  VHI. All rights reserved.

## 2014-09-04 ENCOUNTER — Encounter (HOSPITAL_COMMUNITY): Payer: PRIVATE HEALTH INSURANCE | Admitting: Physical Therapy

## 2014-09-07 ENCOUNTER — Ambulatory Visit (HOSPITAL_COMMUNITY): Payer: Medicare Other | Admitting: Physical Therapy

## 2014-09-07 DIAGNOSIS — M6281 Muscle weakness (generalized): Secondary | ICD-10-CM | POA: Diagnosis not present

## 2014-09-07 DIAGNOSIS — R2689 Other abnormalities of gait and mobility: Secondary | ICD-10-CM

## 2014-09-07 DIAGNOSIS — M6289 Other specified disorders of muscle: Secondary | ICD-10-CM | POA: Diagnosis not present

## 2014-09-07 DIAGNOSIS — R29818 Other symptoms and signs involving the nervous system: Secondary | ICD-10-CM | POA: Diagnosis not present

## 2014-09-07 NOTE — Therapy (Addendum)
[place], [place], [address] Phone: [phone]   Fax:  [phone]  Physical Therapy Treatment  Patient Details  Name: Albert Gonzalez MRN: [id number] Date of Birth: February 23, 1951 Referring Provider:  Carole Civil, MD  Encounter Date: 09/07/2014      PT End of Session - 09/07/14 1020    Visit Number 3   Number of Visits 5   Date for PT Re-Evaluation 09/23/14   Authorization Type Medicare   PT Start Time 0930   PT Stop Time 1013   PT Time Calculation (min) 43 min   Activity Tolerance Patient tolerated treatment well   Behavior During Therapy [facility] for tasks assessed/performed      Past Medical History  Diagnosis Date  . Hyperlipidemia   . Anxiety   . Depression   . Insomnia   . Myalgia   . Arthritis     Past Surgical History  Procedure Laterality Date  . Tonsillectomy    . Knee surgery      left  . Colonoscopy  02/08/2011    Procedure: COLONOSCOPY;  Surgeon: Daneil Dolin, MD;  [facility];  Service: Endoscopy;  Laterality: N/A;  1:30 PM    There were no vitals filed for this visit.  Visit Diagnosis:  Proximal muscle weakness  Balance problem  Muscle weakness of lower extremity      Subjective Assessment - 09/07/14 0932    Subjective Pt denies having any pain today. He reports that he has been compliant with his HEP daily. He experienced tightness in his legs when he first started doing his HEP, but after he completed HEP a few times, his legs loosened up.           [place] Adult PT Treatment/Exercise - 09/07/14 0001    Knee/Hip Exercises: Stretches   Active Hamstring Stretch 3 reps;30 seconds   Active Hamstring Stretch Limitations at 12 inch step   Knee/Hip Exercises: Standing   Heel Raises 15 reps   Heel Raises Limitations unilateral   Forward Lunges 10 reps   Forward Lunges Limitations at 4 inch box   Side Lunges 10 reps   Side Lunges Limitations at 4 inch box   Lateral Step Up 15 reps;Step Height: 6"   Forward Step Up Step Height: 6";15 reps   Functional Squat 10 reps;2 sets   Functional Squat Limitations at chair for tactile cueing   SLS with Vectors cone taps on airex x 5 each leg   Other Standing Knee Exercises abduction walk with green tband x 3 RT   Other Standing Knee Exercises oblique punches with green tband x 15   Knee/Hip Exercises: Supine   Bridges Limitations Bridge on swiss ball 2x15                PT Education - 09/07/14 1020    Education provided Yes   Education Details Progressed HEP   Person(s) Educated Patient   Methods Explanation;Handout   Comprehension Verbalized understanding;Returned demonstration          PT Short Term Goals - 08/24/14 1708    PT SHORT TERM GOAL #1   Title Pt will be independent with HEP.    Time 2   Period Weeks   Status New   PT SHORT TERM GOAL #2   Title Pt will demonstrate 4+/5 BLE strength grossly to improve functional mobility and return pt to PLOF.    Time 2   Period  Weeks   Status New   PT SHORT TERM GOAL #3   Title Pt will maintain SLS x 45 seconds on BLE to decrease risk for falls.    Time 2   Period Weeks   Status New           PT Long Term Goals - 08/24/14 1709    PT LONG TERM GOAL #1   Title Pt will be independent with advanced HEP for BLE and core strengthening.    Time 4   Period Weeks   Status New   PT LONG TERM GOAL #2   Title Pt will demonstrate 5/5 strength of BLE grossly to improve functional mobility and return pt to PLOF.    Time 4   Period Weeks   Status New   PT LONG TERM GOAL #3   Title Pt will maintain SLS x 60 seconds on BLE to decrease risk for falls.    Time 4   Period Weeks   Status New   PT LONG TERM GOAL #4   Title Pt will be consistent with a walking program to improve health habits.    Time 4   Period Weeks   Status New               Plan - 09/07/14 1020    Clinical Impression Statement Treatment focused on  progressing BLE and core strengthening today in order to advance pt's HEP. He denied any pain with the addition of new exercises, and was given an updated HEP to continue with until the next session.   PT Next Visit Plan Progress core strengthening activities        Problem List Patient Active Problem List   Diagnosis Date Noted  . Abnormal CT scan, colon 02/01/2011  . Constipation 02/01/2011     PHYSICAL THERAPY DISCHARGE SUMMARY  Visits from Start of Care: 3  Current functional level related to goals / functional outcomes: Pt was seen for 3 visits in PT and did not return. Unable to assess current functional level.     Remaining deficits: Unable to assess   Education / Equipment: N/A  Plan: Patient agrees to discharge.  Patient goals were not met. Patient is being discharged due to not returning since the last visit.  ?????       Hilma Favors, PT, DPT [phone] 09/07/2014, 10:24 AM  [facility] [address] [place], [place], [address] Phone: [phone]   Fax:  [phone]

## 2014-09-09 ENCOUNTER — Encounter (HOSPITAL_COMMUNITY): Payer: PRIVATE HEALTH INSURANCE | Admitting: Physical Therapy

## 2014-09-30 DIAGNOSIS — E785 Hyperlipidemia, unspecified: Secondary | ICD-10-CM | POA: Diagnosis not present

## 2014-09-30 DIAGNOSIS — M1990 Unspecified osteoarthritis, unspecified site: Secondary | ICD-10-CM | POA: Diagnosis not present

## 2014-09-30 DIAGNOSIS — G4700 Insomnia, unspecified: Secondary | ICD-10-CM | POA: Diagnosis not present

## 2014-09-30 DIAGNOSIS — F419 Anxiety disorder, unspecified: Secondary | ICD-10-CM | POA: Diagnosis not present

## 2014-11-17 ENCOUNTER — Ambulatory Visit (INDEPENDENT_AMBULATORY_CARE_PROVIDER_SITE_OTHER): Payer: Medicare Other | Admitting: Orthopedic Surgery

## 2014-11-17 ENCOUNTER — Encounter: Payer: Self-pay | Admitting: Orthopedic Surgery

## 2014-11-17 VITALS — BP 124/92 | Ht 71.0 in | Wt 185.0 lb

## 2014-11-17 DIAGNOSIS — M170 Bilateral primary osteoarthritis of knee: Secondary | ICD-10-CM

## 2014-11-17 DIAGNOSIS — M5136 Other intervertebral disc degeneration, lumbar region: Secondary | ICD-10-CM

## 2014-11-17 DIAGNOSIS — M51369 Other intervertebral disc degeneration, lumbar region without mention of lumbar back pain or lower extremity pain: Secondary | ICD-10-CM

## 2014-11-17 DIAGNOSIS — M129 Arthropathy, unspecified: Secondary | ICD-10-CM | POA: Diagnosis not present

## 2014-11-17 MED ORDER — DICLOFENAC POTASSIUM 50 MG PO TABS: 50.0000 mg | ORAL_TABLET | Freq: Two times a day (BID) | ORAL | Status: DC

## 2014-11-17 NOTE — Progress Notes (Signed)
Patient ID: Albert Gonzalez, male   DOB: 09-11-1950, 64 y.o.   MRN: [id number]  Follow up visit  Chief Complaint  Patient presents with  . Follow-up    3 month follow up bilateral knees   He had pain in both legs with burning and b/l knee pain   ROS bowel bladder function normal   BP 124/92 mmHg  Ht 5\' 11"  (1.803 m)  Wt 185 lb (83.915 kg)  BMI 25.81 kg/m2  Encounter Diagnoses  Name Primary?  . DDD (degenerative disc disease), lumbar Yes  . Arthritis of both knees     He is pain free after taking meds and after L spine therapy   Meds ordered this encounter  Medications  .       .       .       .       . diclofenac (CATAFLAM) 50 MG tablet    Sig: Take 1 tablet (50 mg total) by mouth 2 (two) times daily.    Dispense:  60 tablet    Refill:  5   Return in 6 months

## 2014-12-30 DIAGNOSIS — Z23 Encounter for immunization: Secondary | ICD-10-CM | POA: Diagnosis not present

## 2014-12-30 DIAGNOSIS — M791 Myalgia: Secondary | ICD-10-CM | POA: Diagnosis not present

## 2014-12-30 DIAGNOSIS — F419 Anxiety disorder, unspecified: Secondary | ICD-10-CM | POA: Diagnosis not present

## 2014-12-30 DIAGNOSIS — M545 Low back pain: Secondary | ICD-10-CM | POA: Diagnosis not present

## 2014-12-30 DIAGNOSIS — M1990 Unspecified osteoarthritis, unspecified site: Secondary | ICD-10-CM | POA: Diagnosis not present

## 2015-01-06 ENCOUNTER — Other Ambulatory Visit (HOSPITAL_COMMUNITY): Payer: Self-pay | Admitting: Pulmonary Disease

## 2015-01-06 DIAGNOSIS — M545 Low back pain: Secondary | ICD-10-CM | POA: Diagnosis not present

## 2015-01-25 ENCOUNTER — Ambulatory Visit (HOSPITAL_COMMUNITY)
Admission: RE | Admit: 2015-01-25 | Discharge: 2015-01-25 | Disposition: A | Discharge status: Home / Self Care | Payer: Medicare Other | Source: Ambulatory Visit | Attending: Pulmonary Disease | Admitting: Pulmonary Disease

## 2015-01-25 DIAGNOSIS — M545 Low back pain: Secondary | ICD-10-CM | POA: Insufficient documentation

## 2015-01-25 DIAGNOSIS — M5126 Other intervertebral disc displacement, lumbar region: Secondary | ICD-10-CM | POA: Diagnosis not present

## 2015-01-25 DIAGNOSIS — M4806 Spinal stenosis, lumbar region: Secondary | ICD-10-CM | POA: Insufficient documentation

## 2015-01-25 DIAGNOSIS — I714 Abdominal aortic aneurysm, without rupture: Secondary | ICD-10-CM | POA: Diagnosis not present

## 2015-01-25 DIAGNOSIS — M4856XA Collapsed vertebra, not elsewhere classified, lumbar region, initial encounter for fracture: Secondary | ICD-10-CM | POA: Diagnosis not present

## 2015-01-25 DIAGNOSIS — R29890 Loss of height: Secondary | ICD-10-CM | POA: Diagnosis not present

## 2015-01-25 DIAGNOSIS — S32010A Wedge compression fracture of first lumbar vertebra, initial encounter for closed fracture: Secondary | ICD-10-CM | POA: Diagnosis not present

## 2015-01-27 ENCOUNTER — Other Ambulatory Visit (HOSPITAL_COMMUNITY): Payer: Self-pay | Admitting: Pulmonary Disease

## 2015-01-27 ENCOUNTER — Other Ambulatory Visit (HOSPITAL_COMMUNITY): Payer: Self-pay

## 2015-01-27 DIAGNOSIS — R29890 Loss of height: Secondary | ICD-10-CM

## 2015-01-27 DIAGNOSIS — Z79899 Other long term (current) drug therapy: Secondary | ICD-10-CM

## 2015-01-27 DIAGNOSIS — IMO0002 Reserved for concepts with insufficient information to code with codable children: Secondary | ICD-10-CM

## 2015-02-03 ENCOUNTER — Ambulatory Visit (HOSPITAL_COMMUNITY)
Admission: RE | Admit: 2015-02-03 | Discharge: 2015-02-03 | Disposition: A | Discharge status: Home / Self Care | Payer: Medicare Other | Source: Ambulatory Visit | Attending: Pulmonary Disease | Admitting: Pulmonary Disease

## 2015-02-03 DIAGNOSIS — M85852 Other specified disorders of bone density and structure, left thigh: Secondary | ICD-10-CM | POA: Diagnosis not present

## 2015-02-03 DIAGNOSIS — Z79899 Other long term (current) drug therapy: Secondary | ICD-10-CM

## 2015-02-03 DIAGNOSIS — Z13820 Encounter for screening for osteoporosis: Secondary | ICD-10-CM | POA: Insufficient documentation

## 2015-02-03 DIAGNOSIS — M8580 Other specified disorders of bone density and structure, unspecified site: Secondary | ICD-10-CM | POA: Diagnosis not present

## 2015-02-10 DIAGNOSIS — H43812 Vitreous degeneration, left eye: Secondary | ICD-10-CM | POA: Diagnosis not present

## 2015-02-10 DIAGNOSIS — H52223 Regular astigmatism, bilateral: Secondary | ICD-10-CM | POA: Diagnosis not present

## 2015-02-10 DIAGNOSIS — H524 Presbyopia: Secondary | ICD-10-CM | POA: Diagnosis not present

## 2015-02-10 DIAGNOSIS — H5203 Hypermetropia, bilateral: Secondary | ICD-10-CM | POA: Diagnosis not present

## 2015-04-01 DIAGNOSIS — F419 Anxiety disorder, unspecified: Secondary | ICD-10-CM | POA: Diagnosis not present

## 2015-04-01 DIAGNOSIS — M1990 Unspecified osteoarthritis, unspecified site: Secondary | ICD-10-CM | POA: Diagnosis not present

## 2015-04-01 DIAGNOSIS — F329 Major depressive disorder, single episode, unspecified: Secondary | ICD-10-CM | POA: Diagnosis not present

## 2015-04-01 DIAGNOSIS — Z23 Encounter for immunization: Secondary | ICD-10-CM | POA: Diagnosis not present

## 2015-04-01 DIAGNOSIS — M4856XA Collapsed vertebra, not elsewhere classified, lumbar region, initial encounter for fracture: Secondary | ICD-10-CM | POA: Diagnosis not present

## 2015-04-07 ENCOUNTER — Other Ambulatory Visit: Payer: Self-pay | Admitting: General Surgery

## 2015-04-07 ENCOUNTER — Other Ambulatory Visit: Payer: Self-pay | Admitting: Radiology

## 2015-04-07 ENCOUNTER — Other Ambulatory Visit (HOSPITAL_COMMUNITY): Payer: Self-pay | Admitting: Interventional Radiology

## 2015-04-07 DIAGNOSIS — IMO0002 Reserved for concepts with insufficient information to code with codable children: Secondary | ICD-10-CM

## 2015-04-07 DIAGNOSIS — M545 Low back pain: Secondary | ICD-10-CM

## 2015-04-08 ENCOUNTER — Ambulatory Visit (HOSPITAL_COMMUNITY)
Admission: RE | Admit: 2015-04-08 | Discharge: 2015-04-08 | Disposition: A | Discharge status: Home / Self Care | Payer: Medicare Other | Source: Ambulatory Visit | Attending: Interventional Radiology | Admitting: Interventional Radiology

## 2015-04-08 ENCOUNTER — Telehealth (HOSPITAL_COMMUNITY): Payer: Self-pay

## 2015-04-08 ENCOUNTER — Encounter (HOSPITAL_COMMUNITY): Payer: Self-pay

## 2015-04-08 DIAGNOSIS — M545 Low back pain: Secondary | ICD-10-CM | POA: Diagnosis not present

## 2015-04-08 DIAGNOSIS — Z882 Allergy status to sulfonamides status: Secondary | ICD-10-CM | POA: Diagnosis not present

## 2015-04-08 DIAGNOSIS — G4700 Insomnia, unspecified: Secondary | ICD-10-CM | POA: Diagnosis not present

## 2015-04-08 DIAGNOSIS — F329 Major depressive disorder, single episode, unspecified: Secondary | ICD-10-CM | POA: Insufficient documentation

## 2015-04-08 DIAGNOSIS — Y92009 Unspecified place in unspecified non-institutional (private) residence as the place of occurrence of the external cause: Secondary | ICD-10-CM | POA: Insufficient documentation

## 2015-04-08 DIAGNOSIS — Z87891 Personal history of nicotine dependence: Secondary | ICD-10-CM | POA: Insufficient documentation

## 2015-04-08 DIAGNOSIS — S32019A Unspecified fracture of first lumbar vertebra, initial encounter for closed fracture: Secondary | ICD-10-CM | POA: Diagnosis not present

## 2015-04-08 DIAGNOSIS — E785 Hyperlipidemia, unspecified: Secondary | ICD-10-CM | POA: Insufficient documentation

## 2015-04-08 DIAGNOSIS — M791 Myalgia: Secondary | ICD-10-CM | POA: Diagnosis not present

## 2015-04-08 DIAGNOSIS — F419 Anxiety disorder, unspecified: Secondary | ICD-10-CM | POA: Diagnosis not present

## 2015-04-08 DIAGNOSIS — W19XXXA Unspecified fall, initial encounter: Secondary | ICD-10-CM | POA: Diagnosis not present

## 2015-04-08 DIAGNOSIS — M1990 Unspecified osteoarthritis, unspecified site: Secondary | ICD-10-CM | POA: Insufficient documentation

## 2015-04-08 DIAGNOSIS — S32010A Wedge compression fracture of first lumbar vertebra, initial encounter for closed fracture: Secondary | ICD-10-CM | POA: Diagnosis not present

## 2015-04-08 DIAGNOSIS — IMO0002 Reserved for concepts with insufficient information to code with codable children: Secondary | ICD-10-CM

## 2015-04-08 DIAGNOSIS — Z7982 Long term (current) use of aspirin: Secondary | ICD-10-CM | POA: Insufficient documentation

## 2015-04-08 DIAGNOSIS — M4856XA Collapsed vertebra, not elsewhere classified, lumbar region, initial encounter for fracture: Secondary | ICD-10-CM | POA: Diagnosis not present

## 2015-04-08 LAB — CBC WITH DIFFERENTIAL/PLATELET
BASOS ABS: 0 10*3/uL (ref 0.0–0.1)
Basophils Relative: 1 %
EOS PCT: 6 %
Eosinophils Absolute: 0.4 10*3/uL (ref 0.0–0.7)
HEMATOCRIT: 41.7 % (ref 39.0–52.0)
HEMOGLOBIN: 14.3 g/dL (ref 13.0–17.0)
LYMPHS ABS: 1.9 10*3/uL (ref 0.7–4.0)
LYMPHS PCT: 30 %
MCH: 29.4 pg (ref 26.0–34.0)
MCHC: 34.3 g/dL (ref 30.0–36.0)
MCV: 85.8 fL (ref 78.0–100.0)
Monocytes Absolute: 0.6 10*3/uL (ref 0.1–1.0)
Monocytes Relative: 10 %
NEUTROS ABS: 3.4 10*3/uL (ref 1.7–7.7)
NEUTROS PCT: 53 %
Platelets: 200 10*3/uL (ref 150–400)
RBC: 4.86 MIL/uL (ref 4.22–5.81)
RDW: 13.4 % (ref 11.5–15.5)
WBC: 6.3 10*3/uL (ref 4.0–10.5)

## 2015-04-08 LAB — BASIC METABOLIC PANEL
ANION GAP: 11 (ref 5–15)
BUN: 12 mg/dL (ref 6–20)
CHLORIDE: 105 mmol/L (ref 101–111)
CO2: 25 mmol/L (ref 22–32)
Calcium: 9.3 mg/dL (ref 8.9–10.3)
Creatinine, Ser: 0.9 mg/dL (ref 0.61–1.24)
Glucose, Bld: 90 mg/dL (ref 65–99)
POTASSIUM: 3.9 mmol/L (ref 3.5–5.1)
SODIUM: 141 mmol/L (ref 135–145)

## 2015-04-08 LAB — PROTIME-INR
INR: 1.09 (ref 0.00–1.49)
Prothrombin Time: 14.3 seconds (ref 11.6–15.2)

## 2015-04-08 LAB — APTT: APTT: 33 s (ref 24–37)

## 2015-04-08 MED ORDER — CEFAZOLIN SODIUM-DEXTROSE 2-3 GM-% IV SOLR
2.0000 g | Freq: Once | INTRAVENOUS | Status: AC
  Administered 2015-04-08: 2 g via INTRAVENOUS

## 2015-04-08 MED ORDER — SODIUM CHLORIDE 0.9 % IV SOLN
INTRAVENOUS | Status: DC
  Administered 2015-04-08: 07:00:00 via INTRAVENOUS

## 2015-04-08 MED ORDER — TOBRAMYCIN SULFATE 1.2 G IJ SOLR
INTRAMUSCULAR | Status: AC
  Filled 2015-04-08: qty 1.2

## 2015-04-08 MED ORDER — MIDAZOLAM HCL 2 MG/2ML IJ SOLN
INTRAMUSCULAR | Status: AC | PRN
  Administered 2015-04-08 (×2): 1 mg via INTRAVENOUS
  Administered 2015-04-08: 0.5 mg via INTRAVENOUS

## 2015-04-08 MED ORDER — CEFAZOLIN SODIUM 1-5 GM-% IV SOLN
INTRAVENOUS | Status: AC
  Filled 2015-04-08: qty 50

## 2015-04-08 MED ORDER — BUPIVACAINE HCL (PF) 0.25 % IJ SOLN
INTRAMUSCULAR | Status: AC
  Filled 2015-04-08: qty 30

## 2015-04-08 MED ORDER — FENTANYL CITRATE (PF) 100 MCG/2ML IJ SOLN
INTRAMUSCULAR | Status: AC
  Filled 2015-04-08: qty 4

## 2015-04-08 MED ORDER — IOHEXOL 300 MG/ML  SOLN
50.0000 mL | Freq: Once | INTRAMUSCULAR | Status: AC | PRN
  Administered 2015-04-08: 5 mL via INTRAVENOUS

## 2015-04-08 MED ORDER — SODIUM CHLORIDE 0.9 % IV SOLN: INTRAVENOUS | Status: AC

## 2015-04-08 MED ORDER — MIDAZOLAM HCL 2 MG/2ML IJ SOLN
INTRAMUSCULAR | Status: AC
  Filled 2015-04-08: qty 4

## 2015-04-08 MED ORDER — FENTANYL CITRATE (PF) 100 MCG/2ML IJ SOLN
INTRAMUSCULAR | Status: AC | PRN
  Administered 2015-04-08: 25 ug via INTRAVENOUS
  Administered 2015-04-08: 12.5 ug via INTRAVENOUS
  Administered 2015-04-08: 25 ug via INTRAVENOUS

## 2015-04-08 MED ORDER — CEFAZOLIN SODIUM-DEXTROSE 2-3 GM-% IV SOLR
INTRAVENOUS | Status: AC
  Filled 2015-04-08: qty 50

## 2015-04-08 MED ORDER — HYDROMORPHONE HCL 1 MG/ML IJ SOLN
INTRAMUSCULAR | Status: AC
  Filled 2015-04-08: qty 2

## 2015-04-08 NOTE — Discharge Instructions (Signed)
1.No stooping ,bending or lifting more than 10 lbs for 2 weeks 2.May use a walker for 2 weeks. 3 RTC in 2 weeks   KYPHOPLASTY/VERTEBROPLASTY DISCHARGE INSTRUCTIONS  Medications: (check all that apply)     Resume all home medications as before procedure.           Continue your pain medications as prescribed as needed.  Over the next 3-5 days, decrease your pain medication as tolerated.  Over the counter medications (i.e. Tylenol, ibuprofen, and aleve) may be substituted once severe/moderate pain symptoms have subsided.   Wound Care: - Bandages may be removed the day following your procedure.  You may get your incision wet once bandages are removed.  Bandaids may be used to cover the incisions until scab formation.  Topical ointments are optional.  - If you develop a fever greater than 101 degrees, have increased skin redness at the incision sites or pus-like oozing from incisions occurring within 1 week of the procedure, contact radiology at [phone] or [phone].  - Ice pack to back for 15-20 minutes 2-3 time per day for first 2-3 days post procedure.  The ice will expedite muscle healing and help with the pain from the incisions.   Activity: - Bedrest today with limited activity for 24 hours post procedure.  - No driving for 48 hours.  - Increase your activity as tolerated after bedrest (with assistance if necessary).  - Refrain from any strenuous activity or heavy lifting (greater than 10 lbs.).   Follow up: - Contact radiology at [phone] or [phone] if any questions/concerns.  - A physician assistant from radiology will contact you in approximately 1 week.  - If a biopsy was performed at the time of your procedure, your referring physician should receive the results in usually 2-3 days.

## 2015-04-08 NOTE — H&P (Signed)
Chief Complaint: Patient was seen in consultation today for Lumbar 1 kyphoplasty  at the request of Dr Velvet Bathe  Referring Physician(s): Dr Velvet Bathe  History of Present Illness: Albert Gonzalez is a 65 y.o. male   Pt injured back after falling at home 5-6 months ago Has tried medical management and still with back pain and worsening Ranks pain 8 when moving about Minimal pain while sitting or lying still 12/2014 MRI: IMPRESSION: New from the prior plain film examination is L1 superior endplate compression fracture with 50% loss of height central-anterior aspect. No significant retropulsion. Edema extends towards the pedicle region. This may represent a benign osteoporotic compression fracture. If the patient did not respond to conservative therapy than followup imaging with contrast to exclude a less likely consideration of a pathologic fractures recommended. Minimal kyphosis at this level.  Now scheduled for L1 vertebroplasty/kyphoplasty in IR   Past Medical History  Diagnosis Date  . Hyperlipidemia   . Anxiety   . Depression   . Insomnia   . Myalgia   . Arthritis     Past Surgical History  Procedure Laterality Date  . Tonsillectomy    . Knee surgery      left  . Colonoscopy  02/08/2011    Procedure: COLONOSCOPY;  Surgeon: Daneil Dolin, MD;  Location: [facility];  Service: Endoscopy;  Laterality: N/A;  1:30 PM    Allergies: Sulfa antibiotics  Medications: Prior to Admission medications   Medication Sig Start Date End Date Taking? Authorizing Provider  ALPRAZolam Duanne Moron) 0.5 MG tablet Take 0.5 mg by mouth at bedtime as needed. For sleep. Does not daily   Yes Historical Provider, MD  aspirin 325 MG tablet Take 325 mg by mouth daily.     Yes Historical Provider, MD  atorvastatin (LIPITOR) 40 MG tablet Take 40 mg by mouth daily.   Yes Historical Provider, MD  beta carotene w/minerals (OCUVITE) tablet Take 1 tablet by mouth daily.   Yes Historical  Provider, MD  Calcium Citrate-Vitamin D (CALCIUM + D PO) Take 2 tablets by mouth daily.   Yes Historical Provider, MD  cyclobenzaprine (FLEXERIL) 10 MG tablet Take 10 mg by mouth 3 (three) times daily as needed. For muscle spasms   Yes Historical Provider, MD  diclofenac (CATAFLAM) 50 MG tablet Take 1 tablet (50 mg total) by mouth 2 (two) times daily. 11/17/14  Yes Carole Civil, MD  FLUoxetine (PROZAC) 40 MG capsule Take 40 mg by mouth daily.   Yes Historical Provider, MD  glucosamine-chondroitin 500-400 MG tablet Take 1 tablet by mouth daily.     Yes Historical Provider, MD  HYDROcodone-ibuprofen (VICOPROFEN) 7.5-200 MG per tablet Take 1 tablet by mouth every 6 (six) hours as needed for moderate pain.   Yes Historical Provider, MD  ibuprofen (ADVIL,MOTRIN) 200 MG tablet Take 800 mg by mouth every 6 (six) hours as needed. For pain    Yes Historical Provider, MD  Multiple Vitamin (MULTIVITAMIN) tablet Take 1 tablet by mouth daily.     Yes Historical Provider, MD  psyllium (METAMUCIL SMOOTH TEXTURE) 28 % packet Take 1 packet by mouth daily.     Yes Historical Provider, MD  QUEtiapine (SEROQUEL) 25 MG tablet Take 25 mg by mouth at bedtime.   Yes Historical Provider, MD     Family History  Problem Relation Age of Onset  . Colon cancer Neg Hx   . Liver disease Neg Hx   . Inflammatory bowel disease Neg  Hx   . Anesthesia problems Neg Hx     Social History   Social History  . Marital Status: Married    Spouse Name: N/A  . Number of Children: 1  . Years of Education: N/A   Occupational History  . respiratory therapist [place] History Main Topics  . Smoking status: Former Smoker -- 0.50 packs/day for 20 years    Types: Pipe  . Smokeless tobacco: None     Comment: quit many years ago  . Alcohol Use: Yes     Comment: occassional  . Drug Use: No  . Sexual Activity: Yes   Other Topics Concern  . None   Social History Narrative    Review of  Systems: A 12 point ROS discussed and pertinent positives are indicated in the HPI above.  All other systems are negative.  Review of Systems  Constitutional: Negative for fever, activity change, appetite change and fatigue.  Respiratory: Negative for shortness of breath.   Gastrointestinal: Positive for abdominal pain.  Musculoskeletal: Positive for back pain. Negative for gait problem.  Neurological: Negative for weakness.  Psychiatric/Behavioral: Negative for behavioral problems and confusion.    Vital Signs: BP 121/83 mmHg  Pulse 65  Temp(Src) 98 F (36.7 C)  Resp 18  Ht 5\' 11"  (1.803 m)  Wt 180 lb (81.647 kg)  BMI 25.12 kg/m2  SpO2 98%  Physical Exam  Constitutional: He is oriented to person, place, and time.  Cardiovascular: Normal rate, regular rhythm and normal heart sounds.   Pulmonary/Chest: Effort normal and breath sounds normal. He has no wheezes.  Abdominal: Soft. Bowel sounds are normal. There is tenderness.  Musculoskeletal: Normal range of motion.  Low back pain  Neurological: He is alert and oriented to person, place, and time.  Skin: Skin is warm and dry.  Psychiatric: He has a normal mood and affect. His behavior is normal. Judgment and thought content normal.  Nursing note and vitals reviewed.   Mallampati Score:  MD Evaluation Airway: WNL Heart: WNL Abdomen: WNL Chest/ Lungs: WNL ASA  Classification: 2 Mallampati/Airway Score: Two  Imaging: No results found.  Labs:  CBC:  Recent Labs  04/08/15 0651  WBC 6.3  HGB 14.3  HCT 41.7  PLT 200    COAGS:  Recent Labs  04/08/15 0651  INR 1.09  APTT 33    BMP:  Recent Labs  04/08/15 0651  NA 141  K 3.9  CL 105  CO2 25  GLUCOSE 90  BUN 12  CALCIUM 9.3  CREATININE 0.90  GFRNONAA >60  GFRAA >60    LIVER FUNCTION TESTS: No results for input(s): BILITOT, AST, ALT, ALKPHOS, PROT, ALBUMIN in the last 8760 hours.  TUMOR MARKERS: No results for input(s): AFPTM, CEA, CA199,  CHROMGRNA in the last 8760 hours.  Assessment and Plan:  Lumbar 1 compression fx per MRI 12/2014 Continued and worsening pain despite medical management Scheduled now for L1 VP/KP Risks and Benefits discussed with the patient including, but not limited to education regarding the natural healing process of compression fractures without intervention, bleeding, infection, cement migration which may cause spinal cord damage, paralysis, pulmonary embolism or even death. All of the patient's questions were answered, patient is agreeable to proceed. Consent signed and in chart.    Thank you for this interesting consult.  I greatly enjoyed meeting Albert Gonzalez and look forward to participating in their care.  A copy of this report was sent to the  requesting provider on this date.  Electronically Signed: Orpah Hausner A 04/08/2015, 7:53 AM   I spent a total of  30 Minutes   in face to face in clinical consultation, greater than 50% of which was counseling/coordinating care for Lumbar 1 VP/KP

## 2015-04-08 NOTE — Procedures (Signed)
S/P L1 balloon KP 

## 2015-04-08 NOTE — Telephone Encounter (Signed)
Called to schedule 2 wk f/u with Dr. Estanislado Pandy. Pt's wife stated that they were going to wait and see how the pt was doing the next two weeks. They chose not to schedule. She stated that she will give [place] a call if pt isn't feeling better after the 2 weeks. AW

## 2015-05-02 ENCOUNTER — Other Ambulatory Visit: Payer: Self-pay | Admitting: Orthopedic Surgery

## 2015-05-12 ENCOUNTER — Other Ambulatory Visit: Payer: Self-pay | Admitting: *Deleted

## 2015-05-12 MED ORDER — DICLOFENAC POTASSIUM 50 MG PO TABS: 50.0000 mg | ORAL_TABLET | Freq: Two times a day (BID) | ORAL | Status: DC

## 2015-05-20 ENCOUNTER — Ambulatory Visit: Payer: Medicare Other | Admitting: Orthopedic Surgery

## 2015-05-31 ENCOUNTER — Ambulatory Visit (INDEPENDENT_AMBULATORY_CARE_PROVIDER_SITE_OTHER): Payer: Medicare Other | Admitting: Orthopedic Surgery

## 2015-05-31 ENCOUNTER — Encounter: Payer: Self-pay | Admitting: Orthopedic Surgery

## 2015-05-31 VITALS — BP 118/68 | HR 74 | Ht 71.0 in | Wt 180.0 lb

## 2015-05-31 DIAGNOSIS — M170 Bilateral primary osteoarthritis of knee: Secondary | ICD-10-CM | POA: Diagnosis not present

## 2015-05-31 MED ORDER — DICLOFENAC POTASSIUM 50 MG PO TABS: 50.0000 mg | ORAL_TABLET | Freq: Two times a day (BID) | ORAL | Status: DC

## 2015-05-31 NOTE — Progress Notes (Signed)
Patient ID: Albert Gonzalez, male   DOB: [birth year], 65 y.o.   MRN: [id number]  Chief Complaint  Patient presents with  . Follow-up    Bilateral knees    HPI mid annual follow-up for bilateral knee pain with osteoarthritis and lumbar spine disease. The patient also fractures lumbar spine was treated with kyphoplasty.  ROS no symptoms at present  BP 118/68 mmHg  Pulse 74  Ht 5\' 11"  (1.803 m)  Wt 180 lb (81.647 kg)  BMI 25.12 kg/m2  Physical Exam  Constitutional: He is oriented to person, place, and time. He appears well-developed and well-nourished. No distress.  Cardiovascular: Normal rate and intact distal pulses.   Neurological: He is alert and oriented to person, place, and time.  Skin: Skin is warm and dry. No rash noted. He is not diaphoretic. No erythema. No pallor.  Psychiatric: He has a normal mood and affect. His behavior is normal. Judgment and thought content normal.    Ortho Exam  Bilateral knees Normal alignment no swelling full range of motion both knee stable strength normal muscle tone Skin normal neurovascular exam intact  ASSESSMENT AND PLAN   Stable osteoarthritis both knees  Follow-up one year continue current medication

## 2015-07-05 DIAGNOSIS — M4856XA Collapsed vertebra, not elsewhere classified, lumbar region, initial encounter for fracture: Secondary | ICD-10-CM | POA: Diagnosis not present

## 2015-07-05 DIAGNOSIS — M1710 Unilateral primary osteoarthritis, unspecified knee: Secondary | ICD-10-CM | POA: Diagnosis not present

## 2015-07-05 DIAGNOSIS — E785 Hyperlipidemia, unspecified: Secondary | ICD-10-CM | POA: Diagnosis not present

## 2015-07-05 DIAGNOSIS — F419 Anxiety disorder, unspecified: Secondary | ICD-10-CM | POA: Diagnosis not present

## 2015-10-05 DIAGNOSIS — M1990 Unspecified osteoarthritis, unspecified site: Secondary | ICD-10-CM | POA: Diagnosis not present

## 2015-10-05 DIAGNOSIS — F329 Major depressive disorder, single episode, unspecified: Secondary | ICD-10-CM | POA: Diagnosis not present

## 2015-10-05 DIAGNOSIS — E785 Hyperlipidemia, unspecified: Secondary | ICD-10-CM | POA: Diagnosis not present

## 2015-10-05 DIAGNOSIS — M4856XA Collapsed vertebra, not elsewhere classified, lumbar region, initial encounter for fracture: Secondary | ICD-10-CM | POA: Diagnosis not present

## 2015-10-08 DIAGNOSIS — M1990 Unspecified osteoarthritis, unspecified site: Secondary | ICD-10-CM | POA: Diagnosis not present

## 2015-10-08 DIAGNOSIS — F329 Major depressive disorder, single episode, unspecified: Secondary | ICD-10-CM | POA: Diagnosis not present

## 2015-10-08 DIAGNOSIS — M4856XD Collapsed vertebra, not elsewhere classified, lumbar region, subsequent encounter for fracture with routine healing: Secondary | ICD-10-CM | POA: Diagnosis not present

## 2015-10-08 DIAGNOSIS — E785 Hyperlipidemia, unspecified: Secondary | ICD-10-CM | POA: Diagnosis not present

## 2015-10-22 ENCOUNTER — Other Ambulatory Visit: Payer: Self-pay | Admitting: Orthopedic Surgery

## 2015-10-22 DIAGNOSIS — M170 Bilateral primary osteoarthritis of knee: Secondary | ICD-10-CM

## 2015-12-22 DIAGNOSIS — Z23 Encounter for immunization: Secondary | ICD-10-CM | POA: Diagnosis not present

## 2015-12-22 DIAGNOSIS — H9202 Otalgia, left ear: Secondary | ICD-10-CM | POA: Diagnosis not present

## 2015-12-22 DIAGNOSIS — F321 Major depressive disorder, single episode, moderate: Secondary | ICD-10-CM | POA: Diagnosis not present

## 2015-12-22 DIAGNOSIS — M25561 Pain in right knee: Secondary | ICD-10-CM | POA: Diagnosis not present

## 2016-01-10 ENCOUNTER — Ambulatory Visit (HOSPITAL_COMMUNITY)
Admission: RE | Admit: 2016-01-10 | Discharge: 2016-01-10 | Disposition: A | Discharge status: Home / Self Care | Payer: Medicare Other | Source: Ambulatory Visit | Attending: Pulmonary Disease | Admitting: Pulmonary Disease

## 2016-01-10 ENCOUNTER — Other Ambulatory Visit (HOSPITAL_COMMUNITY): Payer: Self-pay | Admitting: Pulmonary Disease

## 2016-01-10 DIAGNOSIS — M179 Osteoarthritis of knee, unspecified: Secondary | ICD-10-CM | POA: Diagnosis not present

## 2016-01-10 DIAGNOSIS — M25561 Pain in right knee: Secondary | ICD-10-CM

## 2016-01-10 DIAGNOSIS — M25461 Effusion, right knee: Secondary | ICD-10-CM | POA: Diagnosis not present

## 2016-01-10 DIAGNOSIS — M1711 Unilateral primary osteoarthritis, right knee: Secondary | ICD-10-CM | POA: Diagnosis not present

## 2016-01-14 ENCOUNTER — Ambulatory Visit (INDEPENDENT_AMBULATORY_CARE_PROVIDER_SITE_OTHER): Payer: Medicare Other | Admitting: Orthopedic Surgery

## 2016-01-14 ENCOUNTER — Encounter: Payer: Self-pay | Admitting: Orthopedic Surgery

## 2016-01-14 VITALS — BP 103/73 | HR 67 | Wt 172.0 lb

## 2016-01-14 DIAGNOSIS — M23321 Other meniscus derangements, posterior horn of medial meniscus, right knee: Secondary | ICD-10-CM

## 2016-01-14 DIAGNOSIS — M1711 Unilateral primary osteoarthritis, right knee: Secondary | ICD-10-CM | POA: Diagnosis not present

## 2016-01-14 NOTE — Patient Instructions (Signed)

## 2016-01-14 NOTE — Progress Notes (Signed)
Patient ID: Albert Gonzalez, male   DOB: 18-Jul-1950, 65 y.o.   MRN: [id number]  Chief Complaint  Patient presents with  . Knee Pain    RIGHT KNEE PAIN    HPI Albert Gonzalez is a 65 y.o. male.  History of osteoarthritis. His wife is in the hospital so we had to do a lot of walking and since that time he's had 4-5 weeks of medial knee pain with the leg giving out. His Vicoprofen and diclofenac controlled his knee pain until this new episode which occurred after he had to do the extreme amount of walking  Physical knee goes out pain is moderate there is no swelling  Review of Systems Review of Systems  Constitutional: Negative.   Respiratory: Negative.   Cardiovascular: Negative.      Past Medical History:  Diagnosis Date  . Anxiety   . Arthritis   . Depression   . Hyperlipidemia   . Insomnia   . Myalgia     Past Surgical History:  Procedure Laterality Date  . COLONOSCOPY  02/08/2011   Procedure: COLONOSCOPY;  Surgeon: Daneil Dolin, MD;  Location: [facility];  Service: Endoscopy;  Laterality: N/A;  1:30 PM  . KNEE SURGERY     left  . KYPHOPLASTY    . TONSILLECTOMY      Social History Social History  Substance Use Topics  . Smoking status: Former Smoker    Packs/day: 0.50    Years: 20.00    Types: Pipe  . Smokeless tobacco: Not on file     Comment: quit many years ago  . Alcohol use Yes     Comment: occassional    Allergies  Allergen Reactions  . Sulfa Antibiotics Rash    Childhood     Current Meds  Medication Sig  . ALPRAZolam (XANAX) 0.5 MG tablet Take 0.5 mg by mouth at bedtime as needed. For sleep. Does not daily  . aspirin 325 MG tablet Take 325 mg by mouth daily.    Marland Kitchen atorvastatin (LIPITOR) 40 MG tablet Take 40 mg by mouth daily.  . beta carotene w/minerals (OCUVITE) tablet Take 1 tablet by mouth daily.  . Calcium Citrate-Vitamin D (CALCIUM + D PO) Take 2 tablets by mouth daily.  . cyclobenzaprine (FLEXERIL) 10 MG tablet Take 10 mg by mouth 3  (three) times daily as needed. For muscle spasms  . diclofenac (CATAFLAM) 50 MG tablet Take 1 tablet by mouth two  times daily  . FLUoxetine (PROZAC) 40 MG capsule Take 40 mg by mouth daily.  Marland Kitchen glucosamine-chondroitin 500-400 MG tablet Take 1 tablet by mouth daily.    Marland Kitchen HYDROcodone-ibuprofen (VICOPROFEN) 7.5-200 MG per tablet Take 1 tablet by mouth every 6 (six) hours as needed for moderate pain.  Marland Kitchen ibuprofen (ADVIL,MOTRIN) 200 MG tablet Take 800 mg by mouth every 6 (six) hours as needed. For pain   . Multiple Vitamin (MULTIVITAMIN) tablet Take 1 tablet by mouth daily.    . psyllium (METAMUCIL SMOOTH TEXTURE) 28 % packet Take 1 packet by mouth daily.    . QUEtiapine (SEROQUEL) 25 MG tablet Take 25 mg by mouth at bedtime.      Physical Exam Physical Exam BP 103/73   Pulse 67   Wt 172 lb (78 kg)   BMI 23.99 kg/m   Gen. appearance. The patient is well-developed and well-nourished, grooming and hygiene are normal. There are no gross congenital abnormalities  The patient is alert and oriented to person place and  time  Mood and affect are normal  Ambulation Slight limp favors her right leg  Examination reveals the following: On inspection we find medial joint line tenderness  With the range of motion of  5-1 35  Stability tests were normal    McMurray sign negative  Strength tests revealed grade 5 motor strength  Skin we find no rash ulceration or erythema  Sensation remains intact  Impression vascular system shows no peripheral edema  Data Reviewed X-ray shows medial compartment arthrosis moderate  Assessment    Arthritis versus meniscal tear    Plan    Injection return 5 weeks repeat exam possible MRI needed       Arther Abbott 01/14/2016, 9:39 AM

## 2016-02-14 ENCOUNTER — Other Ambulatory Visit: Payer: Self-pay | Admitting: *Deleted

## 2016-02-14 DIAGNOSIS — M170 Bilateral primary osteoarthritis of knee: Secondary | ICD-10-CM

## 2016-02-14 MED ORDER — DICLOFENAC POTASSIUM 50 MG PO TABS: 50.0000 mg | ORAL_TABLET | Freq: Two times a day (BID) | ORAL | 5 refills | Status: DC

## 2016-02-15 ENCOUNTER — Ambulatory Visit (INDEPENDENT_AMBULATORY_CARE_PROVIDER_SITE_OTHER): Payer: Medicare Other | Admitting: Orthopedic Surgery

## 2016-02-15 DIAGNOSIS — M1711 Unilateral primary osteoarthritis, right knee: Secondary | ICD-10-CM | POA: Diagnosis not present

## 2016-02-15 DIAGNOSIS — M23321 Other meniscus derangements, posterior horn of medial meniscus, right knee: Secondary | ICD-10-CM

## 2016-02-15 NOTE — Progress Notes (Signed)
Patient ID: Albert Gonzalez, male   DOB: 09-01-1950, 65 y.o.   MRN: [id number]  Recheck right knee  HPI Albert Gonzalez is a 65 y.o. male.   HPI  HPI Albert Gonzalez is a 65 y.o. male.  History of osteoarthritis. His wife is in the hospital so we had to do a lot of walking and since that time he's had 4-5 weeks of medial knee pain with the leg giving out. His Vicoprofen and diclofenac controlled his knee pain until this new episode which occurred after he had to do the extreme amount of walking   Physical knee goes out pain is moderate there is no swelling  Injected knee last visit   Review of Systems Review of Systems  Mild knee pain but intermittent episodes of giving way right knee   Physical Exam  Constitutional: He is oriented to person, place, and time. He appears well-developed and well-nourished. No distress.  Cardiovascular: Normal rate and intact distal pulses.   Neurological: He is alert and oriented to person, place, and time.  Skin: Skin is warm and dry. No rash noted. He is not diaphoretic. No erythema. No pallor.  Psychiatric: He has a normal mood and affect. His behavior is normal. Judgment and thought content normal.    Right Knee Exam   Tenderness  The patient is experiencing tenderness in the medial joint line.  Range of Motion  Extension: normal  Flexion: normal   Muscle Strength   The patient has normal right knee strength.  Tests  McMurray:  Medial - negative Lateral - negative Drawer:       Anterior - negative    Posterior - negative Varus: negative Valgus: negative  Other  Erythema: absent Scars: absent Sensation: normal Pulse: present Swelling: none   Left Knee Exam   Tests  McMurray:  Medial - negative Lateral - negative Varus: negative Valgus: negative  Other  Erythema: absent Scars: absent       MEDICAL DECISION MAKING  DATA   None  DIAGNOSIS  Encounter Diagnoses  Name Primary?  Marland Kitchen Arthritis of knee, right Yes   . Derangement of posterior horn of medial meniscus of right knee      PLAN(RISK)    The patient will call the office after his wife's evaluation for possible lumbar disc surgery currently stable  Continue diclofenac as needed

## 2016-03-20 ENCOUNTER — Ambulatory Visit (INDEPENDENT_AMBULATORY_CARE_PROVIDER_SITE_OTHER): Payer: Medicare Other | Admitting: Orthopedic Surgery

## 2016-03-20 DIAGNOSIS — M1711 Unilateral primary osteoarthritis, right knee: Secondary | ICD-10-CM | POA: Diagnosis not present

## 2016-03-20 DIAGNOSIS — M23321 Other meniscus derangements, posterior horn of medial meniscus, right knee: Secondary | ICD-10-CM | POA: Diagnosis not present

## 2016-03-20 NOTE — Patient Instructions (Addendum)
You have decided to proceed with operative arthroscopy of the knee. You have decided not to continue with nonoperative measures such as but not limited to oral medication, weight loss, activity modification, physical therapy, bracing, or injection.  We will perform operative arthroscopy of the knee. Some of the risks associated with arthroscopic surgery of the knee include but are not limited to Bleeding Infection Swelling Stiffness Blood clot Pain  If you're not comfortable with these risks and would like to continue with nonoperative treatment please let Dr. Harrison know prior to your surgery.  Knee Arthroscopy Knee arthroscopy is a surgical procedure that is used to examine the inside of your knee joint and repair any damage. The surgeon puts a small, lighted instrument with a camera on the tip (arthroscope) through a small incision in your knee. The camera sends pictures to a monitor in the operating room. Your surgeon uses those pictures to guide the surgical instruments through other incisions to the area of damage. Knee arthroscopy can be used to treat many types of knee problems. It may be used:  To repair a torn ligament.  To repair or remove damaged tissue.  To remove a fluid-filled sac (cyst) from your knee.  Tell a health care provider about:  Any allergies you have.  All medicines you are taking, including vitamins, herbs, eye drops, creams, and over-the-counter medicines.  Any problems you or family members have had with anesthetic medicines.  Any blood disorders you have.  Any surgeries you have had.  Any medical conditions you have. What are the risks? Generally, this is a safe procedure. However, problems may occur, including:  Infection.  Bleeding.  Damage to blood vessels, nerves, or structures of your knee.  A blood clot that forms in your leg and travels to your lung.  Failure to relieve symptoms.  What happens before the procedure?  Ask your  health care provider about: ? Changing or stopping your regular medicines. This is especially important if you are taking diabetes medicines or blood thinners. ? Taking medicines such as aspirin and ibuprofen. These medicines can thin your blood. Do not take these medicines before your procedure if your health care provider instructs you not to.  Follow your health care provider's instructions about eating or drinking restrictions.  Plan to have someone take you home after the procedure.  If you go home right after the procedure, plan to have someone with you for 24 hours.  Do not drink alcohol unless your health care provider says that you can.  Do not use any tobacco products, including cigarettes, chewing tobacco, or electronic cigarettes unless your health care provider says that you can. If you need help quitting, ask your health care provider.  You may have a physical exam. What happens during the procedure?  An IV tube will be inserted into one of your veins.  You will be given one or more of the following: ? A medicine that helps you relax (sedative). ? A medicine that numbs the area (local anesthetic). ? A medicine that makes you fall asleep (general anesthetic). ? A medicine that is injected into your spine that numbs the area below and slightly above the injection site (spinal anesthetic). ? A medicine that is injected into an area of your body that numbs everything below the injection site (regional anesthetic).  A cuff may be placed around your upper leg to slow bleeding during the procedure.  The surgeon will make a small number of incisions around your   knee.  Your knee joint will be flushed and filled with a germ-free (sterile) solution.  The arthroscope will be passed through an incision into your knee joint.  More instruments will be passed through other incisions to repair your knee as needed.  The fluid will be removed from your knee.  The incisions will be  closed with adhesive strips or stitches (sutures).  A bandage (dressing) will be placed over your knee. The procedure may vary among health care providers and hospitals. What happens after the procedure?  Your blood pressure, heart rate, breathing rate and blood oxygen level will be monitored often until the medicines you were given have worn off.  You may be given medicine for pain.  You may get crutches to help you walk without using your knee to support your body weight.  You may have to wear compression stockings. These stocking help to prevent blood clots and reduce swelling in your legs. This information is not intended to replace advice given to you by your health care provider. Make sure you discuss any questions you have with your health care provider. Document Released: 02/11/2000 Document Revised: 07/22/2015 Document Reviewed: 02/09/2014 Elsevier Interactive Patient Education  2017 [employer]  

## 2016-03-20 NOTE — Progress Notes (Signed)
Patient ID: Albert Gonzalez, male   DOB: [birth year], 66 y.o.   MRN: [id number]  Chief Complaint  Patient presents with  . Follow-up    RIGHT KNEE, WANTS TO DISCUSS SURGERY    HPI Albert Gonzalez is a 66 y.o. male.   HPI  66 year old male with pain in his right knee thought to be meniscal tear we did not do MRI but he says it feels like his other and he is willing to forego the MRI to proceed with arthroscopy right knee  Review of Systems Review of Systems   No chest pain or shortness of breath  Physical Exam  Medial joint line pain intermittent effusions medial joint line tenderness full range of motion full stability strength normal skin intact pulses good sensation normal   MEDICAL DECISION MAKING  DATA   x-ray shows mild compartment narrowing medial lateral compartments   DIAGNOSIS  Encounter Diagnoses  Name Primary?  Marland Kitchen Arthritis of knee, right Yes  . Derangement of posterior horn of medial meniscus of right knee      PLAN(RISK)    This procedure has been fully reviewed with the patient and written informed consent has been obtained.

## 2016-03-20 NOTE — Addendum Note (Signed)
Addended by: Baldomero Lamy B on: 03/20/2016 05:04 PM   Modules accepted: Orders, SmartSet

## 2016-03-23 DIAGNOSIS — F321 Major depressive disorder, single episode, moderate: Secondary | ICD-10-CM | POA: Diagnosis not present

## 2016-03-23 DIAGNOSIS — M1990 Unspecified osteoarthritis, unspecified site: Secondary | ICD-10-CM | POA: Diagnosis not present

## 2016-03-23 DIAGNOSIS — E785 Hyperlipidemia, unspecified: Secondary | ICD-10-CM | POA: Diagnosis not present

## 2016-03-23 DIAGNOSIS — M4856XA Collapsed vertebra, not elsewhere classified, lumbar region, initial encounter for fracture: Secondary | ICD-10-CM | POA: Diagnosis not present

## 2016-03-30 NOTE — Patient Instructions (Signed)
SUTTER LOMBARDOZZI  03/30/2016     @PREFPERIOPPHARMACY @   Your procedure is scheduled on  04/05/2016   Report to [facility] at  615   A.M.  Call this number if you have problems the morning of surgery:  [phone]   Remember:  Do not eat food or drink liquids after midnight.  Take these medicines the morning of surgery with A SIP OF WATER  Flexaril or zanaflex, diclofenac, prozac, vicoprofen.   Do not wear jewelry, make-up or nail polish.  Do not wear lotions, powders, or perfumes, or deoderant.  Do not shave 48 hours prior to surgery.  Men may shave face and neck.  Do not bring valuables to the hospital.  [facility] is not responsible for any belongings or valuables.  Contacts, dentures or bridgework may not be worn into surgery.  Leave your suitcase in the car.  After surgery it may be brought to your room.  For patients admitted to the hospital, discharge time will be determined by your treatment team.  Patients discharged the day of surgery will not be allowed to drive home.   Name and phone number of your driver:   family Special instructions:  none  Please read over the following fact sheets that you were given. Anesthesia Post-op Instructions and Care and Recovery After Surgery      Knee Ligament Injury, Arthroscopy Arthroscopy is a surgical technique in which your health care provider examines your knee through a small, pencil-sized telescope (arthroscope). Often, repairs to injured ligaments can be done with instruments in the arthroscope. Arthroscopy is less invasive than open-knee surgery. Tell a health care provider about:  Any allergies you have.  All medicines you are taking, including vitamins, herbs, eye drops, creams, and over-the-counter medicines.  Any problems you or family members have had with anesthetic medicines.  Any blood disorders you have.  Any surgeries you have had.  Any medical conditions you have. What are the  risks? Generally, this is a safe procedure. However, as with any procedure, problems can occur. Possible problems include:  Infection.  Bleeding.  Stiffness. What happens before the procedure?  Ask your health care provider about changing or stopping any regular medicines. Avoid taking aspirin or blood thinners as directed by your health care provider.  Do not eat or drink anything after midnight the night before surgery.  If you smoke, do not smoke for at least 2 weeks before your surgery.  Do not drink alcohol starting the day before your surgery.  Let your health care provider know if you develop a cold or any infection before your surgery.  Arrange for someone to drive you home after the surgery or after your hospital stay. Also arrange for someone to help you with activities during recovery. What happens during the procedure?  Small monitors will be put on your body. They are used to check your heart, blood pressure, and oxygen levels.  An IV access tube will be put into one of your veins. Medicine will be able to flow directly into your body through this IV tube.  You might be given a medicine to help you relax (sedative).  You will be given a medicine that makes you go to sleep (general anesthetic), and a breathing tube will be placed into your lungs during the procedure.  Several small incisions are made in your knee. Saline fluid is placed into one of the incisions to expand the knee and  clear away any blood in the knee.  Your health care provider will insert the arthroscope to examine the injured knee.  During arthroscopy, your health care provider may find a partial or complete tear in a ligament.  Tools can be inserted through the other incisions to repair the injured ligaments.  The incisions are then closed with absorbable stitches and covered with dressings. What happens after the procedure?  You will be taken to the recovery area where you will be  monitored.  When you are awake, stable, and taking fluids without problems, you will be allowed to go home. This information is not intended to replace advice given to you by your health care provider. Make sure you discuss any questions you have with your health care provider. Document Released: 02/11/2000 Document Revised: 07/22/2015 Document Reviewed: 09/25/2012 Elsevier Interactive Patient Education  2017 [place]. Knee Ligament Injury, Arthroscopy, Care After Refer to this sheet in the next few weeks. These instructions provide you with information on caring for yourself after your procedure. Your health care provider may also give you more specific instructions. Your treatment has been planned according to current medical practices, but problems sometimes occur. Call your health care provider if you have any problems or questions after your procedure. What can I expect after the procedure? After your procedure, it is typical to have the following:  Pain and swelling in your knee.  Your ankle and calf may be swollen and bruised for 3-4 days.  You will be tired during your recovery and will need to rest during the day. You will return to your normal level of activity over the next month following surgery.  You may be constipated. Follow these instructions at home: Ligaments take a long time to heal. For the first several weeks, you may be instructed to limit the amount of weight you put on your leg. A removable knee immobilizer or hinged splint may be used to support your knee. You can use crutches to help you get up and move around. Recovery exercises can help you regain strength and range of motion in your knee. Increased muscle strength helps support the knee. These exercises allow a faster return to normal activities. Consult your health care provider before performing any exercise. The following suggestions may help to relieve discomfort at home after your procedure:  Apply ice to  the injured area:  Put ice in a plastic bag.  Place a towel between your skin and the bag.  Leave the ice on for 20 minutes, 2-3 times a day.  To help relieve constipation, drink 6-8 glasses of water a day and eat plenty of fruits and vegetables. You may be given a stool softener to prevent constipation.  You will be given medicine to control pain. Only take over-the-counter or prescription medicines for pain, discomfort, or fever as directed by your health care provider. Contact a health care provider if:  You have increased bleeding (more than a small spot) from your incision site.  You notice redness, swelling, or increasing pain at your incision site.  You notice pus coming from your incision.  You notice a foul smell coming from your incision or dressing.  You develop increasing stiffness or pain in your knee. Get help right away if:  You have a fever.  You develop a rash, difficulty breathing, or any allergic problems. This information is not intended to replace advice given to you by your health care provider. Make sure you discuss any questions you have  with your health care provider. Document Released: 12/04/2012 Document Revised: 07/22/2015 Document Reviewed: 09/25/2012 Elsevier Interactive Patient Education  2017 [employer] PATIENT INSTRUCTIONS POST-ANESTHESIA  IMMEDIATELY FOLLOWING SURGERY:  Do not drive or operate machinery for the first twenty four hours after surgery.  Do not make any important decisions for twenty four hours after surgery or while taking narcotic pain medications or sedatives.  If you develop intractable nausea and vomiting or a severe headache please notify your doctor immediately.  FOLLOW-UP:  Please make an appointment with your surgeon as instructed. You do not need to follow up with anesthesia unless specifically instructed to do so.  WOUND CARE INSTRUCTIONS (if applicable):  Keep a dry clean dressing on the anesthesia/puncture wound site  if there is drainage.  Once the wound has quit draining you may leave it open to air.  Generally you should leave the bandage intact for twenty four hours unless there is drainage.  If the epidural site drains for more than 36-48 hours please call the anesthesia department.  QUESTIONS?:  Please feel free to call your physician or the hospital operator if you have any questions, and they will be happy to assist you.

## 2016-04-03 ENCOUNTER — Encounter (HOSPITAL_COMMUNITY)
Admission: RE | Admit: 2016-04-03 | Discharge: 2016-04-03 | Disposition: A | Discharge status: Home / Self Care | Payer: Medicare Other | Source: Ambulatory Visit | Attending: Orthopedic Surgery | Admitting: Orthopedic Surgery

## 2016-04-03 ENCOUNTER — Encounter (HOSPITAL_COMMUNITY): Payer: Self-pay

## 2016-04-03 DIAGNOSIS — F329 Major depressive disorder, single episode, unspecified: Secondary | ICD-10-CM | POA: Diagnosis not present

## 2016-04-03 DIAGNOSIS — Z87891 Personal history of nicotine dependence: Secondary | ICD-10-CM | POA: Diagnosis not present

## 2016-04-03 DIAGNOSIS — S83241A Other tear of medial meniscus, current injury, right knee, initial encounter: Secondary | ICD-10-CM | POA: Diagnosis not present

## 2016-04-03 DIAGNOSIS — M1990 Unspecified osteoarthritis, unspecified site: Secondary | ICD-10-CM | POA: Diagnosis not present

## 2016-04-03 DIAGNOSIS — F419 Anxiety disorder, unspecified: Secondary | ICD-10-CM | POA: Diagnosis not present

## 2016-04-03 DIAGNOSIS — M791 Myalgia: Secondary | ICD-10-CM | POA: Diagnosis not present

## 2016-04-03 DIAGNOSIS — E785 Hyperlipidemia, unspecified: Secondary | ICD-10-CM | POA: Diagnosis not present

## 2016-04-03 DIAGNOSIS — G4700 Insomnia, unspecified: Secondary | ICD-10-CM | POA: Diagnosis not present

## 2016-04-03 HISTORY — DX: Unilateral inguinal hernia, without obstruction or gangrene, not specified as recurrent: K40.90

## 2016-04-03 LAB — BASIC METABOLIC PANEL
Anion gap: 6 (ref 5–15)
BUN: 13 mg/dL (ref 6–20)
CALCIUM: 9.1 mg/dL (ref 8.9–10.3)
CO2: 27 mmol/L (ref 22–32)
CREATININE: 0.94 mg/dL (ref 0.61–1.24)
Chloride: 104 mmol/L (ref 101–111)
Glucose, Bld: 104 mg/dL — ABNORMAL HIGH (ref 65–99)
Potassium: 4.3 mmol/L (ref 3.5–5.1)
SODIUM: 137 mmol/L (ref 135–145)

## 2016-04-03 LAB — CBC
HCT: 43.8 % (ref 39.0–52.0)
Hemoglobin: 14.8 g/dL (ref 13.0–17.0)
MCH: 29.2 pg (ref 26.0–34.0)
MCHC: 33.8 g/dL (ref 30.0–36.0)
MCV: 86.6 fL (ref 78.0–100.0)
PLATELETS: 204 10*3/uL (ref 150–400)
RBC: 5.06 MIL/uL (ref 4.22–5.81)
RDW: 13.4 % (ref 11.5–15.5)
WBC: 7.6 10*3/uL (ref 4.0–10.5)

## 2016-04-04 NOTE — H&P (Signed)
History and physical for surgery  Chief complaint pain right knee  History 66 year old male with pain in his right knee postsurgery often tried injections physical therapy but when his wife was in the hospital had to do a lot of walking to get to her room in the knee started bothering him worse and has now become unbearable for him. Complains of medial joint line pain which is dull throbbing occasionally stabbing moderate to severe worse with bending squatting kneeling and associated with intermittent swelling  Review of Systems  All other systems reviewed and are negative.  Past Medical History:  Diagnosis Date  . Anxiety   . Arthritis   . Depression   . Hyperlipidemia   . Inguinal hernia right   . Insomnia   . Myalgia    Past Surgical History:  Procedure Laterality Date  . COLONOSCOPY  02/08/2011   Procedure: COLONOSCOPY;  Surgeon: Daneil Dolin, MD;  Location: [facility];  Service: Endoscopy;  Laterality: N/A;  1:30 PM  . KNEE SURGERY     left  . KYPHOPLASTY    . TONSILLECTOMY     Social History  Substance Use Topics  . Smoking status: Former Smoker    Packs/day: 0.50    Years: 20.00    Types: Pipe    Quit date: 04/03/1990  . Smokeless tobacco: Never Used     Comment: quit many years ago  . Alcohol use Yes     Comment: occassional   Family History  Problem Relation Age of Onset  . Colon cancer Neg Hx   . Liver disease Neg Hx   . Inflammatory bowel disease Neg Hx   . Anesthesia problems Neg Hx     VS: BP (!) 130/91   Pulse 62   Temp 98.2 F (36.8 C) (Oral)   Resp 16   Ht 5\' 11"  (1.803 m)   Wt 172 lb (78 kg)   SpO2 97%   BMI 23.99 kg/m   Physical Exam  Constitutional: He is oriented to person, place, and time. He appears well-developed and well-nourished. No distress.  HENT:  Head: Normocephalic and atraumatic.  Right Ear: External ear normal.  Left Ear: External ear normal.  Nose: Nose normal.  Eyes: Conjunctivae and EOM are normal. Pupils are  equal, round, and reactive to light. Right eye exhibits no discharge. Left eye exhibits no discharge.  Neck: Normal range of motion. Neck supple. No tracheal deviation present. No thyromegaly present.  Cardiovascular: Normal rate, regular rhythm and intact distal pulses.   Pulmonary/Chest: Effort normal and breath sounds normal. No stridor. No respiratory distress. He has no wheezes. He has no rales.  Abdominal: Soft. He exhibits no distension. There is no guarding.  Musculoskeletal:  As far as the right knee goes there is tenderness on the medial joint line small effusion. Range of motion is approximately 130. The knee is stable McMurray sign is positive for medial meniscal tear strength is normal skin over the right knee normal.    Lymphadenopathy:    He has no cervical adenopathy.  Neurological: He is alert and oriented to person, place, and time. He has normal reflexes. He displays normal reflexes. No cranial nerve deficit. He exhibits normal muscle tone. Coordination normal.  Skin: Skin is warm and dry. He is not diaphoretic.  Psychiatric: He has a normal mood and affect. His behavior is normal. Judgment and thought content normal.   Data x-ray show arthritis of the right knee   Primary osteoarthritis. torn  medial meniscus right knee   Plan arthroscopy right knee partial medial meniscectomy This procedure has been fully reviewed with the patient and written informed consent has been obtained.

## 2016-04-05 ENCOUNTER — Encounter (HOSPITAL_COMMUNITY): Payer: Self-pay

## 2016-04-05 ENCOUNTER — Encounter (HOSPITAL_COMMUNITY)
Admission: RE | Disposition: A | Discharge status: Home / Self Care | Payer: Self-pay | Source: Ambulatory Visit | Attending: Orthopedic Surgery

## 2016-04-05 ENCOUNTER — Ambulatory Visit (HOSPITAL_COMMUNITY)
Admission: RE | Admit: 2016-04-05 | Discharge: 2016-04-05 | Disposition: A | Discharge status: Home / Self Care | Payer: Medicare Other | Source: Ambulatory Visit | Attending: Orthopedic Surgery | Admitting: Orthopedic Surgery

## 2016-04-05 ENCOUNTER — Ambulatory Visit (HOSPITAL_COMMUNITY): Payer: Medicare Other | Admitting: Anesthesiology

## 2016-04-05 DIAGNOSIS — S83242A Other tear of medial meniscus, current injury, left knee, initial encounter: Secondary | ICD-10-CM

## 2016-04-05 DIAGNOSIS — F329 Major depressive disorder, single episode, unspecified: Secondary | ICD-10-CM | POA: Insufficient documentation

## 2016-04-05 DIAGNOSIS — S83281A Other tear of lateral meniscus, current injury, right knee, initial encounter: Secondary | ICD-10-CM

## 2016-04-05 DIAGNOSIS — F419 Anxiety disorder, unspecified: Secondary | ICD-10-CM | POA: Diagnosis not present

## 2016-04-05 DIAGNOSIS — G4700 Insomnia, unspecified: Secondary | ICD-10-CM | POA: Diagnosis not present

## 2016-04-05 DIAGNOSIS — S83242D Other tear of medial meniscus, current injury, left knee, subsequent encounter: Secondary | ICD-10-CM | POA: Diagnosis not present

## 2016-04-05 DIAGNOSIS — M1990 Unspecified osteoarthritis, unspecified site: Secondary | ICD-10-CM | POA: Insufficient documentation

## 2016-04-05 DIAGNOSIS — M791 Myalgia: Secondary | ICD-10-CM | POA: Insufficient documentation

## 2016-04-05 DIAGNOSIS — Z87891 Personal history of nicotine dependence: Secondary | ICD-10-CM | POA: Insufficient documentation

## 2016-04-05 DIAGNOSIS — S83281D Other tear of lateral meniscus, current injury, right knee, subsequent encounter: Secondary | ICD-10-CM

## 2016-04-05 DIAGNOSIS — S83241A Other tear of medial meniscus, current injury, right knee, initial encounter: Secondary | ICD-10-CM | POA: Insufficient documentation

## 2016-04-05 DIAGNOSIS — M238X1 Other internal derangements of right knee: Secondary | ICD-10-CM | POA: Diagnosis not present

## 2016-04-05 DIAGNOSIS — M1711 Unilateral primary osteoarthritis, right knee: Secondary | ICD-10-CM | POA: Diagnosis not present

## 2016-04-05 DIAGNOSIS — E785 Hyperlipidemia, unspecified: Secondary | ICD-10-CM | POA: Insufficient documentation

## 2016-04-05 HISTORY — PX: KNEE ARTHROSCOPY WITH MEDIAL MENISECTOMY: SHX5651

## 2016-04-05 SURGERY — ARTHROSCOPY, KNEE, WITH MEDIAL MENISCECTOMY
Anesthesia: General | Site: Knee | Laterality: Right

## 2016-04-05 MED ORDER — LIDOCAINE HCL 1 % IJ SOLN
INTRAMUSCULAR | Status: DC | PRN
  Administered 2016-04-05: 25 mg via INTRADERMAL

## 2016-04-05 MED ORDER — HYDROMORPHONE HCL 1 MG/ML IJ SOLN: 0.2500 mg | INTRAMUSCULAR | Status: DC | PRN

## 2016-04-05 MED ORDER — FENTANYL CITRATE (PF) 100 MCG/2ML IJ SOLN
INTRAMUSCULAR | Status: DC | PRN
  Administered 2016-04-05: 50 ug via INTRAVENOUS
  Administered 2016-04-05: 25 ug via INTRAVENOUS

## 2016-04-05 MED ORDER — CEFAZOLIN SODIUM-DEXTROSE 2-4 GM/100ML-% IV SOLN
2.0000 g | INTRAVENOUS | Status: AC
  Administered 2016-04-05: 2 g via INTRAVENOUS
  Filled 2016-04-05: qty 100

## 2016-04-05 MED ORDER — CHLORHEXIDINE GLUCONATE 4 % EX LIQD: 60.0000 mL | Freq: Once | CUTANEOUS | Status: DC

## 2016-04-05 MED ORDER — SODIUM CHLORIDE 0.9 % IJ SOLN
INTRAMUSCULAR | Status: AC
  Filled 2016-04-05: qty 10

## 2016-04-05 MED ORDER — LACTATED RINGERS IV SOLN
INTRAVENOUS | Status: DC
  Administered 2016-04-05: 1000 mL via INTRAVENOUS

## 2016-04-05 MED ORDER — BUPIVACAINE-EPINEPHRINE (PF) 0.5% -1:200000 IJ SOLN
INTRAMUSCULAR | Status: AC
Start: 2016-04-05 — End: 2016-04-05
  Filled 2016-04-05: qty 60

## 2016-04-05 MED ORDER — EPHEDRINE SULFATE 50 MG/ML IJ SOLN
INTRAMUSCULAR | Status: AC
  Filled 2016-04-05: qty 1

## 2016-04-05 MED ORDER — MIDAZOLAM HCL 5 MG/5ML IJ SOLN
INTRAMUSCULAR | Status: DC | PRN
  Administered 2016-04-05: 2 mg via INTRAVENOUS

## 2016-04-05 MED ORDER — MIDAZOLAM HCL 2 MG/2ML IJ SOLN
INTRAMUSCULAR | Status: AC
Start: 2016-04-05 — End: 2016-04-05
  Filled 2016-04-05: qty 2

## 2016-04-05 MED ORDER — FENTANYL CITRATE (PF) 250 MCG/5ML IJ SOLN
INTRAMUSCULAR | Status: AC
  Filled 2016-04-05: qty 5

## 2016-04-05 MED ORDER — PROPOFOL 10 MG/ML IV BOLUS
INTRAVENOUS | Status: DC | PRN
  Administered 2016-04-05: 150 mg via INTRAVENOUS

## 2016-04-05 MED ORDER — MIDAZOLAM HCL 2 MG/2ML IJ SOLN
1.0000 mg | INTRAMUSCULAR | Status: AC
  Administered 2016-04-05 (×2): 2 mg via INTRAVENOUS
  Filled 2016-04-05 (×2): qty 2

## 2016-04-05 MED ORDER — EPHEDRINE SULFATE 50 MG/ML IJ SOLN
INTRAMUSCULAR | Status: DC | PRN
  Administered 2016-04-05: 10 mg via INTRAVENOUS

## 2016-04-05 MED ORDER — EPINEPHRINE PF 1 MG/ML IJ SOLN
INTRAMUSCULAR | Status: AC
  Filled 2016-04-05: qty 5

## 2016-04-05 MED ORDER — SODIUM CHLORIDE 0.9 % IR SOLN
Status: DC | PRN
  Administered 2016-04-05 (×4): 3000 mL

## 2016-04-05 MED ORDER — LIDOCAINE HCL (PF) 1 % IJ SOLN
INTRAMUSCULAR | Status: AC
  Filled 2016-04-05: qty 5

## 2016-04-05 MED ORDER — PROPOFOL 10 MG/ML IV BOLUS
INTRAVENOUS | Status: AC
  Filled 2016-04-05: qty 20

## 2016-04-05 MED ORDER — HYDROCODONE-ACETAMINOPHEN 7.5-325 MG PO TABS: 1.0000 | ORAL_TABLET | ORAL | 0 refills | Status: DC | PRN

## 2016-04-05 MED ORDER — BUPIVACAINE-EPINEPHRINE (PF) 0.5% -1:200000 IJ SOLN
INTRAMUSCULAR | Status: DC | PRN
  Administered 2016-04-05: 50 mL
  Administered 2016-04-05: 10 mL

## 2016-04-05 SURGICAL SUPPLY — 49 items
BAG HAMPER (MISCELLANEOUS) ×3 IMPLANT
BANDAGE ELASTIC 6 LF NS (GAUZE/BANDAGES/DRESSINGS) ×3 IMPLANT
BIT DRILL 2.0X128 (BIT) ×2 IMPLANT
BIT DRILL 2.0X128MM (BIT) ×1
BLADE AGGRESSIVE PLUS 4.0 (BLADE) ×3 IMPLANT
BLADE SURG SZ11 CARB STEEL (BLADE) ×3 IMPLANT
BNDG CMPR MED 5X6 ELC HKLP NS (GAUZE/BANDAGES/DRESSINGS) ×1
CHLORAPREP W/TINT 26ML (MISCELLANEOUS) ×3 IMPLANT
CLOTH BEACON ORANGE TIMEOUT ST (SAFETY) ×3 IMPLANT
COOLER CRYO IC GRAV AND TUBE (ORTHOPEDIC SUPPLIES) ×3 IMPLANT
CUFF CRYO KNEE18X23 MED (MISCELLANEOUS) ×3 IMPLANT
CUFF TOURNIQUET SINGLE 34IN LL (TOURNIQUET CUFF) ×3 IMPLANT
DECANTER SPIKE VIAL GLASS SM (MISCELLANEOUS) ×6 IMPLANT
GAUZE SPONGE 4X4 12PLY STRL (GAUZE/BANDAGES/DRESSINGS) ×3 IMPLANT
GAUZE SPONGE 4X4 16PLY XRAY LF (GAUZE/BANDAGES/DRESSINGS) ×3 IMPLANT
GAUZE XEROFORM 5X9 LF (GAUZE/BANDAGES/DRESSINGS) ×3 IMPLANT
GLOVE BIOGEL PI IND STRL 7.0 (GLOVE) ×1 IMPLANT
GLOVE BIOGEL PI INDICATOR 7.0 (GLOVE) ×2
GLOVE SKINSENSE NS SZ8.0 LF (GLOVE) ×2
GLOVE SKINSENSE STRL SZ8.0 LF (GLOVE) ×1 IMPLANT
GLOVE SS N UNI LF 8.5 STRL (GLOVE) ×3 IMPLANT
GOWN STRL REUS W/ TWL LRG LVL3 (GOWN DISPOSABLE) ×1 IMPLANT
GOWN STRL REUS W/TWL LRG LVL3 (GOWN DISPOSABLE) ×3
GOWN STRL REUS W/TWL XL LVL3 (GOWN DISPOSABLE) ×3 IMPLANT
HLDR LEG FOAM (MISCELLANEOUS) ×1 IMPLANT
IV NS IRRIG 3000ML ARTHROMATIC (IV SOLUTION) ×12 IMPLANT
KIT BLADEGUARD II DBL (SET/KITS/TRAYS/PACK) ×3 IMPLANT
KIT ROOM TURNOVER AP CYSTO (KITS) ×3 IMPLANT
LEG HOLDER FOAM (MISCELLANEOUS) ×2
MANIFOLD NEPTUNE II (INSTRUMENTS) ×3 IMPLANT
MARKER SKIN DUAL TIP RULER LAB (MISCELLANEOUS) ×3 IMPLANT
NEEDLE HYPO 18GX1.5 BLUNT FILL (NEEDLE) ×3 IMPLANT
NEEDLE HYPO 21X1.5 SAFETY (NEEDLE) ×3 IMPLANT
NEEDLE SPNL 18GX3.5 QUINCKE PK (NEEDLE) ×3 IMPLANT
NS IRRIG 1000ML POUR BTL (IV SOLUTION) ×3 IMPLANT
PACK ARTHRO LIMB DRAPE STRL (MISCELLANEOUS) ×3 IMPLANT
PAD ABD 5X9 TENDERSORB (GAUZE/BANDAGES/DRESSINGS) ×3 IMPLANT
PAD ARMBOARD 7.5X6 YLW CONV (MISCELLANEOUS) ×3 IMPLANT
PADDING CAST COTTON 6X4 STRL (CAST SUPPLIES) ×3 IMPLANT
PADDING WEBRIL 6 STERILE (GAUZE/BANDAGES/DRESSINGS) ×3 IMPLANT
SET ARTHROSCOPY INST (INSTRUMENTS) ×3 IMPLANT
SET ARTHROSCOPY PUMP TUBE (IRRIGATION / IRRIGATOR) ×3 IMPLANT
SET BASIN LINEN APH (SET/KITS/TRAYS/PACK) ×3 IMPLANT
SUT ETHILON 3 0 FSL (SUTURE) ×3 IMPLANT
SYR 30ML LL (SYRINGE) ×3 IMPLANT
SYRINGE 10CC LL (SYRINGE) ×3 IMPLANT
TUBE CONNECTING 12'X1/4 (SUCTIONS) ×4
TUBE CONNECTING 12X1/4 (SUCTIONS) ×8 IMPLANT
WAND 50 DEG COVAC W/CORD (SURGICAL WAND) ×3 IMPLANT

## 2016-04-05 NOTE — Discharge Instructions (Signed)
The medial meniscus was torn Lateral meniscus was torn The anterior cruciate ligament was partially torn Arthritis was noted throughout the knee There was a large chondral defect of the medial femoral condyle   We performed a partial medial meniscectomy, partial lateral meniscectomy, debridement of the anterior cruciate ligament tear, microfracture medial femoral condyle

## 2016-04-05 NOTE — Anesthesia Procedure Notes (Signed)
Performed by: Cai Anfinson J       

## 2016-04-05 NOTE — H&P (View-Only) (Signed)
Patient ID: Albert Gonzalez, male   DOB: [birth year], 66 y.o.   MRN: [id number]  Chief Complaint  Patient presents with  . Follow-up    RIGHT KNEE, WANTS TO DISCUSS SURGERY    HPI Albert Gonzalez is a 65 y.o. male.   HPI  66 year old male with pain in his right knee thought to be meniscal tear we did not do MRI but he says it feels like his other and he is willing to forego the MRI to proceed with arthroscopy right knee  Review of Systems Review of Systems   No chest pain or shortness of breath  Physical Exam  Medial joint line pain intermittent effusions medial joint line tenderness full range of motion full stability strength normal skin intact pulses good sensation normal   MEDICAL DECISION MAKING  DATA   x-ray shows mild compartment narrowing medial lateral compartments   DIAGNOSIS  Encounter Diagnoses  Name Primary?  Marland Kitchen Arthritis of knee, right Yes  . Derangement of posterior horn of medial meniscus of right knee      PLAN(RISK)    This procedure has been fully reviewed with the patient and written informed consent has been obtained.

## 2016-04-05 NOTE — Transfer of Care (Signed)
Immediate Anesthesia Transfer of Care Note  Patient: Albert Gonzalez  Procedure(s) Performed: Procedure(s): RIGHT KNEE ARTHROSCOPY WITH PARTIAL  LATERAL MENISECTOMY, DEBRIDEMENT MEDIAL MENISCUS, ACL DEBRIDEMENT, MICRO FRACTURE OF FEMUR (Right)  Patient Location: PACU  Anesthesia Type:General  Level of Consciousness: awake, oriented and patient cooperative  Airway & Oxygen Therapy: Patient Spontanous Breathing and Patient connected to face mask oxygen  Post-op Assessment: Report given to RN, Post -op Vital signs reviewed and stable and Patient moving all extremities  Post vital signs: Reviewed and stable  Last Vitals:  Vitals:   04/05/16 0710 04/05/16 0715  BP: (!) 130/91 122/88  Pulse:    Resp: 16 20  Temp:      Last Pain:  Vitals:   04/05/16 0625  TempSrc: Oral      Patients Stated Pain Goal: 6 ([id number])  Complications: No apparent anesthesia complications

## 2016-04-05 NOTE — Anesthesia Procedure Notes (Signed)
Procedure Name: LMA Insertion Date/Time: 04/05/2016 7:30 AM Performed by: Charmaine Downs Pre-anesthesia Checklist: Patient identified, Patient being monitored, Emergency Drugs available, Timeout performed and Suction available Patient Re-evaluated:Patient Re-evaluated prior to inductionOxygen Delivery Method: Circle System Utilized Preoxygenation: Pre-oxygenation with 100% oxygen Intubation Type: IV induction Ventilation: Mask ventilation without difficulty LMA: LMA inserted LMA Size: 4.0 Number of attempts: 2 Placement Confirmation: positive ETCO2 and breath sounds checked- equal and bilateral Tube secured with: Tape Dental Injury: Teeth and Oropharynx as per pre-operative assessment

## 2016-04-05 NOTE — Interval H&P Note (Signed)
History and Physical Interval Note:  04/05/2016 7:06 AM BP 128/89   Pulse 62   Temp 98.2 F (36.8 C) (Oral)   Resp 14   Ht 5\' 11"  (1.803 m)   Wt 172 lb (78 kg)   SpO2 94%   BMI 23.99 kg/m   Skin normal   Albert Gonzalez  has presented today for surgery, with the diagnosis of RIGHT MEDIAL MENISCUS TEAR  The various methods of treatment have been discussed with the patient and family. After consideration of risks, benefits and other options for treatment, the patient has consented to  Procedure(s): KNEE ARTHROSCOPY WITH MEDIAL MENISECTOMY (Right) as a surgical intervention .  The patient's history has been reviewed, patient examined, no change in status, stable for surgery.  I have reviewed the patient's chart and labs.  Questions were answered to the patient's satisfaction.     Arther Abbott

## 2016-04-05 NOTE — Anesthesia Postprocedure Evaluation (Signed)
Anesthesia Post Note  Patient: KHAMARI TERNES  Procedure(s) Performed: Procedure(s) (LRB): RIGHT KNEE ARTHROSCOPY WITH PARTIAL  LATERAL MENISECTOMY, DEBRIDEMENT MEDIAL MENISCUS, ACL DEBRIDEMENT, MICRO FRACTURE OF FEMUR (Right)  Patient location during evaluation: PACU Anesthesia Type: General Level of consciousness: awake and alert, oriented and patient cooperative Pain management: pain level controlled Vital Signs Assessment: post-procedure vital signs reviewed and stable Respiratory status: spontaneous breathing, nonlabored ventilation and respiratory function stable Cardiovascular status: blood pressure returned to baseline Postop Assessment: no signs of nausea or vomiting Anesthetic complications: no     Last Vitals:  Vitals:   04/05/16 0715 04/05/16 0837  BP: 122/88   Pulse:  77  Resp: 20 19  Temp:  36.4 C    Last Pain:  Vitals:   04/05/16 0625  TempSrc: Oral                 Aquarius Tremper J

## 2016-04-05 NOTE — Brief Op Note (Signed)
04/05/2016  8:26 AM  PATIENT:  Albert Gonzalez  66 y.o. male  PRE-OPERATIVE DIAGNOSIS:  RIGHT knee MEDIAL MENISCUS TEAR  POST-OPERATIVE DIAGNOSIS:  RIGHT knee  MEDIAL MENISCUS TEAR,  RIGHT LATERAL MENISCAL TEAR,  CHONDRAL DEFECTMEDIAL FEMORAL CONDYLE,  PARTIAL ACL TEAR  Operative findings #1 tear posterior horn lateral meniscus #2 degenerative tear medial meniscus #3 9 x 3 mm chondral defect weightbearing surface medial femoral condyle #4 degenerative tearing of the anterior cruciate ligament posterior medial bundle  PROCEDURE:  Procedure(s): RIGHT KNEE ARTHROSCOPY WITH  Partial lateral meniscectomy posterior horn, debridement tibial chondral surface Debridement posterior horn medial meniscus Debridement anterior cruciate ligament Microfracture medial femoral condyle  Knee arthroscopy dictation  The patient was identified in the preoperative holding area using 2 approved identification mechanisms. The chart was reviewed and updated. The surgical site was confirmed as right knee and marked with an indelible marker.  The patient was taken to the operating room for anesthesia. After successful  general anesthesia, 2 g Ancef was used as IV antibiotics.  The patient was placed in the supine position with the (right) the operative extremity in an arthroscopic leg holder and the opposite extremity in a padded leg holder.  The timeout was executed.  A lateral portal was established with an 11 blade and the scope was introduced into the joint. A diagnostic arthroscopy was performed in circumferential manner examining the entire knee joint. A medial portal was established and the diagnostic arthroscopy was repeated using a probe to palpate intra-articular structures as they were encountered.    The lateral meniscus was resected using a duckbill forceps. The meniscal fragments were removed with a motorized shaver. The meniscus was balanced with a combination of a motorized shaver and a 50  ArthroCare wand until a stable rim was obtained.  The medial meniscus had a degenerative posterior horn tear and this was debrided with an ArthroCare wand to a stable rim  I then debrided the anterior cruciate ligament with the shaver and ArthroCare wand  I debrided the chondral defect to a stable shelf all the way around circumferentially.  I then made a separate portal using a spinal needle to access the medial femoral condyle lesion. We drilled 7 holes into the subchondral bone and marrow and confirmed bleeding in each bed    The arthroscopic pump was placed on the wash mode and any excess debris was removed from the joint using suction.  60 cc of Marcaine with epinephrine was injected through the arthroscope.  The portals were closed with 3-0 nylon suture.  A sterile bandage, Ace wrap and Cryo/Cuff was placed and the Cryo/Cuff was activated. The patient was taken to the recovery room in stable condition.   SURGEON:  Surgeon(s) and Role:    * Carole Civil, MD - Primary  PHYSICIAN ASSISTANT:   ASSISTANTS: none   ANESTHESIA:   general  EBL:  Total I/O In: 500 [I.V.:500] Out: 5 [Blood:5]  BLOOD ADMINISTERED:none  DRAINS: none   LOCAL MEDICATIONS USED:  MARCAINE     SPECIMEN:  No Specimen  DISPOSITION OF SPECIMEN:  N/A  COUNTS:  YES  TOURNIQUET:    DICTATION: .Dragon Dictation  PLAN OF CARE: Discharge to home after PACU  PATIENT DISPOSITION:  PACU - hemodynamically stable.   Delay start of Pharmacological VTE agent (>24hrs) due to surgical blood loss or risk of bleeding: not applicable  [id number]

## 2016-04-05 NOTE — Op Note (Signed)
04/05/2016  8:26 AM  PATIENT:  Albert Gonzalez  66 y.o. male  PRE-OPERATIVE DIAGNOSIS:  RIGHT knee MEDIAL MENISCUS TEAR  POST-OPERATIVE DIAGNOSIS:  RIGHT knee  MEDIAL MENISCUS TEAR,  RIGHT LATERAL MENISCAL TEAR,  CHONDRAL DEFECTMEDIAL FEMORAL CONDYLE,  PARTIAL ACL TEAR  Operative findings #1 tear posterior horn lateral meniscus #2 degenerative tear medial meniscus #3 9 x 3 mm chondral defect weightbearing surface medial femoral condyle #4 degenerative tearing of the anterior cruciate ligament posterior medial bundle  PROCEDURE:  Procedure(s): RIGHT KNEE ARTHROSCOPY WITH  Partial lateral meniscectomy posterior horn, debridement tibial chondral surface Debridement posterior horn medial meniscus Debridement anterior cruciate ligament Microfracture medial femoral condyle  Knee arthroscopy dictation  The patient was identified in the preoperative holding area using 2 approved identification mechanisms. The chart was reviewed and updated. The surgical site was confirmed as right knee and marked with an indelible marker.  The patient was taken to the operating room for anesthesia. After successful  general anesthesia, 2 g Ancef was used as IV antibiotics.  The patient was placed in the supine position with the (right) the operative extremity in an arthroscopic leg holder and the opposite extremity in a padded leg holder.  The timeout was executed.  A lateral portal was established with an 11 blade and the scope was introduced into the joint. A diagnostic arthroscopy was performed in circumferential manner examining the entire knee joint. A medial portal was established and the diagnostic arthroscopy was repeated using a probe to palpate intra-articular structures as they were encountered.    The lateral meniscus was resected using a duckbill forceps. The meniscal fragments were removed with a motorized shaver. The meniscus was balanced with a combination of a motorized shaver and a 50  ArthroCare wand until a stable rim was obtained.  The medial meniscus had a degenerative posterior horn tear and this was debrided with an ArthroCare wand to a stable rim  I then debrided the anterior cruciate ligament with the shaver and ArthroCare wand  I debrided the chondral defect to a stable shelf all the way around circumferentially.  I then made a separate portal using a spinal needle to access the medial femoral condyle lesion. We drilled 7 holes into the subchondral bone and marrow and confirmed bleeding in each bed    The arthroscopic pump was placed on the wash mode and any excess debris was removed from the joint using suction.  60 cc of Marcaine with epinephrine was injected through the arthroscope.  The portals were closed with 3-0 nylon suture.  A sterile bandage, Ace wrap and Cryo/Cuff was placed and the Cryo/Cuff was activated. The patient was taken to the recovery room in stable condition.   SURGEON:  Surgeon(s) and Role:    * Carole Civil, MD - Primary  PHYSICIAN ASSISTANT:   ASSISTANTS: none   ANESTHESIA:   general  EBL:  Total I/O In: 500 [I.V.:500] Out: 5 [Blood:5]  BLOOD ADMINISTERED:none  DRAINS: none   LOCAL MEDICATIONS USED:  MARCAINE     SPECIMEN:  No Specimen  DISPOSITION OF SPECIMEN:  N/A  COUNTS:  YES  TOURNIQUET:    DICTATION: .Dragon Dictation  PLAN OF CARE: Discharge to home after PACU  PATIENT DISPOSITION:  PACU - hemodynamically stable.   Delay start of Pharmacological VTE agent (>24hrs) due to surgical blood loss or risk of bleeding: not applicable  [id number]

## 2016-04-05 NOTE — Anesthesia Preprocedure Evaluation (Addendum)
Anesthesia Evaluation  Patient identified by MRN, date of birth, ID band Patient awake    Reviewed: Allergy & Precautions, NPO status , Patient's Chart, lab work & pertinent test results  Airway Mallampati: I  TM Distance: <3 FB Neck ROM: Full    Dental  (+) Teeth Intact   Pulmonary former smoker,    breath sounds clear to auscultation       Cardiovascular negative cardio ROS   Rhythm:Regular Rate:Normal     Neuro/Psych PSYCHIATRIC DISORDERS Anxiety Depression    GI/Hepatic negative GI ROS,   Endo/Other    Renal/GU      Musculoskeletal  (+) Arthritis ,   Abdominal   Peds  Hematology   Anesthesia Other Findings   Reproductive/Obstetrics                            Anesthesia Physical Anesthesia Plan  ASA: II  Anesthesia Plan: General   Post-op Pain Management:    Induction: Intravenous  Airway Management Planned: LMA  Additional Equipment:   Intra-op Plan:   Post-operative Plan: Extubation in OR  Informed Consent: I have reviewed the patients History and Physical, chart, labs and discussed the procedure including the risks, benefits and alternatives for the proposed anesthesia with the patient or authorized representative who has indicated his/her understanding and acceptance.     Plan Discussed with:   Anesthesia Plan Comments:         Anesthesia Quick Evaluation

## 2016-04-06 ENCOUNTER — Encounter (HOSPITAL_COMMUNITY): Payer: Self-pay | Admitting: Orthopedic Surgery

## 2016-04-07 ENCOUNTER — Ambulatory Visit (INDEPENDENT_AMBULATORY_CARE_PROVIDER_SITE_OTHER): Payer: Medicare Other | Admitting: Orthopedic Surgery

## 2016-04-07 ENCOUNTER — Encounter: Payer: Self-pay | Admitting: Orthopedic Surgery

## 2016-04-07 DIAGNOSIS — Z9889 Other specified postprocedural states: Secondary | ICD-10-CM

## 2016-04-07 DIAGNOSIS — Z4889 Encounter for other specified surgical aftercare: Secondary | ICD-10-CM

## 2016-04-07 NOTE — Progress Notes (Signed)
Patient ID: Albert Gonzalez, male   DOB: 1951-01-08, 66 y.o.   MRN: [id number]  Follow up visit/  postop visit #1  Chief Complaint  Patient presents with  . Follow-up    POST OP 1, SARK, DOS 04/05/16  PRE-OPERATIVE DIAGNOSIS:  RIGHT knee MEDIAL MENISCUS TEAR  POST-OPERATIVE DIAGNOSIS:  RIGHT knee  MEDIAL MENISCUS TEAR,  RIGHT LATERAL MENISCAL TEAR,  CHONDRAL DEFECTMEDIAL FEMORAL CONDYLE,  PARTIAL ACL TEAR  Operative findings #1 tear posterior horn lateral meniscus #2 degenerative tear medial meniscus #3 9 x 3 mm chondral defect weightbearing surface medial femoral condyle #4 degenerative tearing of the anterior cruciate ligament posterior medial bundle  PROCEDURE:  Procedure(s): RIGHT KNEE ARTHROSCOPY WITH  Partial lateral meniscectomy posterior horn, debridement tibial chondral surface Debridement posterior horn medial meniscus Debridement anterior cruciate ligament Microfracture medial femoral condyle   The patient is complaining of swelling of the knee  The knee looks good overall there is some swelling which is expected after microfracture  Start therapy twice a week  Follow-up in a week check swelling and check if aspiration   Encounter Diagnoses  Name Primary?  . S/P right knee arthroscopy Yes  . Aftercare following surgery     There were no vitals taken for this visit.  10:47 AM Arther Abbott, MD 04/07/2016

## 2016-04-14 ENCOUNTER — Encounter: Payer: Self-pay | Admitting: Orthopedic Surgery

## 2016-04-14 ENCOUNTER — Ambulatory Visit (INDEPENDENT_AMBULATORY_CARE_PROVIDER_SITE_OTHER): Payer: Medicare Other | Admitting: Orthopedic Surgery

## 2016-04-14 DIAGNOSIS — Z9889 Other specified postprocedural states: Secondary | ICD-10-CM

## 2016-04-14 DIAGNOSIS — Z4889 Encounter for other specified surgical aftercare: Secondary | ICD-10-CM

## 2016-04-14 NOTE — Patient Instructions (Signed)
Resume 25 knee bends 3 times a day  Continue ice 3 times a day  Start ibuprofen 800 mg 3 times a day  Return 1 week

## 2016-04-14 NOTE — Progress Notes (Signed)
Chief Complaint  Patient presents with  . Follow-up    post op, Albert Gonzalez, DOS 04/05/16    Status post arthroscopy right knee with microfracture meniscectomy  Complains of aching pain and swelling  Exam shows effusion.  No erythema no tenderness to palpation  Recommend aspiration  Procedure note  aspiration right knee joint  Verbal consent was obtained to aspirate  right knee joint   Timeout was completed to confirm the site of aspiration  An 18-gauge needle was used to aspirate the knee joint from a suprapatellar lateral approach.   Anesthesia was provided by ethyl chloride and the skin was prepped with alcohol.  After cleaning the skin with alcohol an 18-gauge needle was used to aspirate the right knee joint.  We obtained 50  cc of blood  We follow this by injection of 40 mg of Depo-Medrol and 3 cc 1% lidocaine.  There were no complications. A sterile bandage was applied.  Resume knee bends 253 times a day  Use crutches, brace is irritating him so we will remove it for now  Return 1 week  PRE-OPERATIVE DIAGNOSIS:  RIGHT knee MEDIAL MENISCUS TEAR  POST-OPERATIVE DIAGNOSIS:  RIGHT knee  MEDIAL MENISCUS TEAR,  RIGHT LATERAL MENISCAL TEAR,  CHONDRAL DEFECTMEDIAL FEMORAL CONDYLE,  PARTIAL ACL TEAR  Operative findings #1 tear posterior horn lateral meniscus #2 degenerative tear medial meniscus #3 9 x 3 mm chondral defect weightbearing surface medial femoral condyle #4 degenerative tearing of the anterior cruciate ligament posterior medial bundle  PROCEDURE:  Procedure(s): RIGHT KNEE ARTHROSCOPY WITH  Partial lateral meniscectomy posterior horn, debridement tibial chondral surface Debridement posterior horn medial meniscus Debridement anterior cruciate ligament Microfracture medial femoral condyle

## 2016-04-21 ENCOUNTER — Ambulatory Visit (INDEPENDENT_AMBULATORY_CARE_PROVIDER_SITE_OTHER): Payer: Self-pay | Admitting: Orthopedic Surgery

## 2016-04-21 DIAGNOSIS — Z4889 Encounter for other specified surgical aftercare: Secondary | ICD-10-CM

## 2016-04-21 DIAGNOSIS — Z9889 Other specified postprocedural states: Secondary | ICD-10-CM

## 2016-04-21 NOTE — Patient Instructions (Signed)
Home exercises daily   brace for walking   Continue ice with the cuff

## 2016-04-21 NOTE — Progress Notes (Signed)
Patient ID: Albert Gonzalez, male   DOB: 11/13/1950, 66 y.o.   MRN: [id number]  Follow up visit/  arthroscopy and microfracture meniscectomy status post aspiration for postop hemarthrosis  Chief Complaint  Patient presents with  . Follow-up    1 week recheck on right knee, post op, DOS 04-05-16.    He is deathly feeling much better moving much better walking much better.  He is only taking Advil now he is ambulatory with a slight limp without his walker   Encounter Diagnoses  Name Primary?  Marland Kitchen Aftercare following surgery Yes  . S/P right knee arthroscopy     Home exercises with a pet pad and follow-up with me in 4 weeks  11:37 AM Arther Abbott, MD 04/21/2016

## 2016-05-09 DIAGNOSIS — S83202S Bucket-handle tear of unspecified meniscus, current injury, unspecified knee, sequela: Secondary | ICD-10-CM | POA: Diagnosis not present

## 2016-05-09 DIAGNOSIS — F419 Anxiety disorder, unspecified: Secondary | ICD-10-CM | POA: Diagnosis not present

## 2016-05-09 DIAGNOSIS — F320 Major depressive disorder, single episode, mild: Secondary | ICD-10-CM | POA: Diagnosis not present

## 2016-05-09 DIAGNOSIS — M1990 Unspecified osteoarthritis, unspecified site: Secondary | ICD-10-CM | POA: Diagnosis not present

## 2016-05-19 ENCOUNTER — Ambulatory Visit (INDEPENDENT_AMBULATORY_CARE_PROVIDER_SITE_OTHER): Payer: Self-pay | Admitting: Orthopedic Surgery

## 2016-05-19 DIAGNOSIS — Z4889 Encounter for other specified surgical aftercare: Secondary | ICD-10-CM

## 2016-05-19 DIAGNOSIS — Z9889 Other specified postprocedural states: Secondary | ICD-10-CM

## 2016-05-19 NOTE — Progress Notes (Signed)
Patient ID: Albert Gonzalez, male   DOB: Nov 03, 1950, 66 y.o.   MRN: [id number]  POSTOP VISIT   Chief complaint surgery on 04/05/2016 follow-up visit  Main problem at this time is night pain which is relieved by ibuprofen and ice  Knee flexion arc is 125 has a small flexion contracture less than 5 no swelling in the joint today  Recommend continue bracing home exercises and return in one month  Encounter Diagnoses  Name Primary?  Marland Kitchen Aftercare following surgery Yes  . S/P right knee arthroscopy       11:31 AM Arther Abbott, MD 05/19/2016

## 2016-05-29 ENCOUNTER — Ambulatory Visit: Payer: Medicare Other | Admitting: Orthopedic Surgery

## 2016-06-19 ENCOUNTER — Encounter: Payer: Self-pay | Admitting: Orthopedic Surgery

## 2016-06-19 ENCOUNTER — Ambulatory Visit (INDEPENDENT_AMBULATORY_CARE_PROVIDER_SITE_OTHER): Payer: Self-pay | Admitting: Orthopedic Surgery

## 2016-06-19 DIAGNOSIS — Z9889 Other specified postprocedural states: Secondary | ICD-10-CM

## 2016-06-19 DIAGNOSIS — Z4889 Encounter for other specified surgical aftercare: Secondary | ICD-10-CM

## 2016-06-19 NOTE — Progress Notes (Signed)
FOLLOW UP VISIT   Patient ID: Albert Gonzalez, male   DOB: 06/07/1950, 66 y.o.   MRN: [id number]  Chief Complaint  Patient presents with  . Follow-up    SARK, DOS 04/05/16    HPI Albert Gonzalez is a 65 y.o. male.   HPI  Status post knee arthroscopy with microfracture. The patient is doing very well and has no major complaints  Review of Systems Review of Systems  Physical Exam  Knee looks very good is regained his motion he has no swelling no tenderness walking very well   [place]  DATA    Encounter Diagnoses  Name Primary?  Marland Kitchen Aftercare following surgery Yes  . S/P right knee arthroscopy      PLAN(RISK)    Recommend brace for heavy activity otherwise follow-up as needed

## 2016-07-21 DIAGNOSIS — H524 Presbyopia: Secondary | ICD-10-CM | POA: Diagnosis not present

## 2016-07-21 DIAGNOSIS — H5203 Hypermetropia, bilateral: Secondary | ICD-10-CM | POA: Diagnosis not present

## 2016-07-21 DIAGNOSIS — H43812 Vitreous degeneration, left eye: Secondary | ICD-10-CM | POA: Diagnosis not present

## 2016-07-21 DIAGNOSIS — H52223 Regular astigmatism, bilateral: Secondary | ICD-10-CM | POA: Diagnosis not present

## 2016-08-16 DIAGNOSIS — F419 Anxiety disorder, unspecified: Secondary | ICD-10-CM | POA: Diagnosis not present

## 2016-08-16 DIAGNOSIS — F320 Major depressive disorder, single episode, mild: Secondary | ICD-10-CM | POA: Diagnosis not present

## 2016-08-16 DIAGNOSIS — M1990 Unspecified osteoarthritis, unspecified site: Secondary | ICD-10-CM | POA: Diagnosis not present

## 2016-08-16 DIAGNOSIS — G4700 Insomnia, unspecified: Secondary | ICD-10-CM | POA: Diagnosis not present

## 2016-08-22 NOTE — Progress Notes (Signed)
Psychiatric Initial Adult Assessment   Patient Identification: Albert Gonzalez MRN:  [id number] Date of Evaluation:  08/24/2016 Referral Source: Sinda Du Chief Complaint:   Chief Complaint    Depression; New Evaluation     Visit Diagnosis:    ICD-10-CM   1. MDD (major depressive disorder), recurrent episode, moderate (HCC) F33.1     History of Present Illness:   Albert Gonzalez is a 66 year old male with depression, anxiety, dyslipidemia, osteoarthritis, who is referred for depression.   Reviewed chart from Dr. Ermalinda Memos; he was started on duloxetine 60 mg daily for depression on 08/16/2016.   He states that he is here for depression. He talks about his wife of 16 years, who suffers from RA. Although she used to be active, she cannot walk by herself due to unsteady gait. He also takes care of his step son, 64 year old with RA and who also has ADD. He feels hopeless as his wife is getting debilitated, while he is always the person to "fix" any problems. He is also concerned that he will likely need to take care of his step son indefinitely. 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 He believes that his depression got worse over the past few months. He asked Dr. Ermalinda Memos for this referral given he has a concern as he has a father who committed suicide by shooting himself.   He denies insomnia when he takes quetiapine. He has fatigue and anhedonia. He goes to food bank for volunteer, which makes him feel better. He also enjoys gardening and enjoy company watching them with his wife. He denies difficulty with concentration. He has passive SI, although he denies any intent or plans. 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 He has intrusive thoughts and nightmares, which has been getting worse lately. He tries not to think about it. He denies hypervigilance.  He feels anxious and tense when he sees his wife. He denies decreased need for sleep or euphoria. He feels irritable. He denies panic attacks. He drinks one glass of wine, once a week. He denies drug use. He takes Xanax 0.5 mg once or twice a day. He denies gun access at home.   Per [employer] database:  Patient is on hydrocodone.  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  Consequences of Substance Abuse: NA  Past Medical History:  Past Medical History:  Diagnosis Date  . Anxiety   . Arthritis   . Depression   . Hyperlipidemia   . Inguinal hernia right   . Insomnia   . Myalgia     Past Surgical History:  Procedure Laterality Date  . COLONOSCOPY  02/08/2011   Procedure: COLONOSCOPY;  Surgeon:  Daneil Dolin, MD;  Location: [facility];  Service: Endoscopy;  Laterality: N/A;  1:30 PM  . KNEE ARTHROSCOPY WITH MEDIAL MENISECTOMY Right 04/05/2016   Procedure: RIGHT KNEE ARTHROSCOPY WITH PARTIAL  LATERAL MENISECTOMY, DEBRIDEMENT MEDIAL MENISCUS, ACL DEBRIDEMENT, MICRO FRACTURE OF FEMUR;  Surgeon: Carole Civil, MD;  Location: [facility];  Service:  Orthopedics;  Laterality: Right;  . KNEE SURGERY     left  . KYPHOPLASTY    . TONSILLECTOMY      Family Psychiatric History:  Father- attempted suicide  Family History:  Family History  Problem Relation Age of Onset  . Colon cancer Neg Hx   . Liver disease Neg Hx   . Inflammatory bowel disease Neg Hx   . Anesthesia problems Neg Hx     Social History:   Social History   Social History  . Marital status: Married    Spouse name: N/A  . Number of children: 1  . Years of education: N/A   Occupational History  . respiratory therapist [place] History Main Topics  . Smoking status: Former Smoker    Packs/day: 0.50    Years: 20.00    Types: Pipe    Quit date: 04/03/1990  . Smokeless tobacco: Never Used     Comment: quit many years ago  . Alcohol use Yes     Comment: occassional  . Drug use: No  . Sexual activity: Yes   Other Topics Concern  . Not on file   Social History Narrative  . No narrative on file    Additional Social History:  Married twice, divorced after 21 years, currently married for 78 years, he has a daughter, age 68 year He lives with his wife with RA and his step son with RA, ADD He was born in [place], grew up in [place], reports his father who fought in [place] himself when he was 66 years old. He and his siblings were raised by his mother. He reports good relationship with her. 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 Sulfa Antibiotics Rash    Childhood     Metabolic Disorder Labs: No results found for: HGBA1C, MPG No results found for: PROLACTIN No results found for: CHOL, TRIG, HDL, CHOLHDL, VLDL, LDLCALC   Current Medications: Current Outpatient Prescriptions  Medication Sig  Dispense Refill  . ALPRAZolam (XANAX) 0.5 MG tablet Take 0.5 mg by mouth 4 (four) times daily as needed for anxiety.    . DULoxetine (CYMBALTA) 60 MG capsule Take 60 mg by mouth daily.    Marland Kitchen HYDROcodone-acetaminophen (NORCO) 7.5-325 MG tablet Take 1 tablet by mouth every 4 (four) hours as needed for moderate pain. 30 tablet 0  . QUEtiapine (SEROQUEL) 25 MG tablet Take 25 mg by mouth at bedtime.    Marland Kitchen aspirin 325 MG tablet Take 325 mg by mouth daily.      Marland Kitchen atorvastatin (LIPITOR) 40 MG tablet Take 40 mg by mouth daily.    . Calcium Citrate-Vitamin D (CALCIUM + D PO) Take 2 tablets by mouth daily.    . cyclobenzaprine (FLEXERIL) 10 MG tablet Take 10 mg by mouth daily. For muscle spasms    . diclofenac (CATAFLAM) 50 MG tablet Take 1 tablet (50 mg total)  by mouth 2 (two) times daily. 180 tablet 5  . FLUoxetine (PROZAC) 40 MG capsule Take 40 mg by mouth daily.    Marland Kitchen ibuprofen (ADVIL,MOTRIN) 200 MG tablet Take 400 mg by mouth every 6 (six) hours as needed for mild pain. For pain     . Misc Natural Products (GLUCOSAMINE CHOND DOUBLE STR PO) Take 2 tablets by mouth daily.    . Multiple Vitamin (MULTIVITAMIN) tablet Take 1 tablet by mouth daily. Men's 50+    . Multiple Vitamins-Minerals (EQ VISION FORMULA 50+ PO) Take 1 tablet by mouth daily.    . psyllium (METAMUCIL SMOOTH TEXTURE) 28 % packet Take 1 packet by mouth daily.      Marland Kitchen tiZANidine (ZANAFLEX) 4 MG tablet Take 4 mg by mouth every 6 (six) hours as needed for muscle spasms.     No current facility-administered medications for this visit.     Neurologic: Headache: No Seizure: No Paresthesias:No  Musculoskeletal: Strength & Muscle Tone: within normal limits Gait & Station: normal Patient leans: N/A  Psychiatric Specialty Exam: ROS  Blood pressure 117/75, pulse 75, height 5' 9.88" (1.775 m), weight 181 lb 12.8 oz (82.5 kg).Body mass index is 26.17 kg/m.  General Appearance: Well Groomed  Eye Contact:  Good  Speech:  Clear and Coherent   Volume:  Normal  Mood:  Depressed  Affect:  Appropriate, Congruent and Depressed, tearful at times  Thought Process:  Coherent and Goal Directed  Orientation:  Full (Time, Place, and Person)  Thought Content:  Logical Perceptions: denies AH/VH  Suicidal Thoughts:  Yes.  without intent/plan  Homicidal Thoughts:  No  Memory:  Immediate;   Good Recent;   Good Remote;   Good  Judgement:  Good  Insight:  Good  Psychomotor Activity:  Normal  Concentration:  Concentration: Good and Attention Span: Good  Recall:  Good  Fund of Knowledge:Good  Language: Good  Akathisia:  No  Handed:  Right  AIMS (if indicated):  N/A  Assets:  Communication Skills Desire for Improvement  ADL's:  Intact  Cognition: WNL  Sleep:  fair   Assessment Albert Gonzalez is a 66 year old male with depression, anxiety, dyslipidemia, osteoarthritis, who is referred for depression.   # MDD, recurrent, moderate without psychotic features # r/o PTSD Exam is notable for his tearful affect and patient endorses neurovegetative symptoms in the setting of being a caregiver of his wife and his step son. Will continue duloxetine to see whether it exerts its full effect (started one week ago). Will continue quetiapine as adjunctive treatment for depression and insomnia. Patient is advised to hold Xanax if able; he is amenable to switch to other medication in the future, given its risk of dependence. He will greatly benefit from supportive therapy to prevent caregiver burnout and also to process loss; will make a referral. Information for support group is also provided as below.   Plan 1. Continue duloxetine 60 mg daily 2. Continue quetiapine 25 mg at night 3. Try to avoid taking Xanax if you are able to  4. Return to clinic in one month for 30 mins 5. Referral to therapy 6. Contact emergency resources- 911, ED, suicide crisis line [phone]) if any worsening in suicidal thought 7. Consider support group as  below  [place] Date/time: Third Tuesday of each month, 7 p.m. Location: [facility]. [facility], [place] Provides education and support and practical information for coping  with arthritis for arthritis sufferers and their families. When: Noon - 1:30 p.m. the second Monday of each month, March through December Info: Call Rehabilitation Services at [phone]  The patient demonstrates the following risk factors for suicide: Chronic risk factors for suicide include: psychiatric disorder of depression and chronic pain. Acute risk factors for suicide include: unemployment and social withdrawal/isolation. Protective factors for this patient include: responsibility to others (children, family), coping skills and hope for the future. Considering these factors, the overall suicide risk at this point appears to be low. Patient is appropriate for outpatient follow up.   Treatment Plan Summary: Plan as above   Norman Clay, MD 6/28/20185:05 PM

## 2016-08-24 ENCOUNTER — Ambulatory Visit (INDEPENDENT_AMBULATORY_CARE_PROVIDER_SITE_OTHER): Payer: Medicare Other | Admitting: Psychiatry

## 2016-08-24 VITALS — BP 117/75 | HR 75 | Ht 69.88 in | Wt 181.8 lb

## 2016-08-24 DIAGNOSIS — Z7982 Long term (current) use of aspirin: Secondary | ICD-10-CM

## 2016-08-24 DIAGNOSIS — Z79891 Long term (current) use of opiate analgesic: Secondary | ICD-10-CM

## 2016-08-24 DIAGNOSIS — F419 Anxiety disorder, unspecified: Secondary | ICD-10-CM

## 2016-08-24 DIAGNOSIS — F331 Major depressive disorder, recurrent, moderate: Secondary | ICD-10-CM | POA: Insufficient documentation

## 2016-08-24 DIAGNOSIS — Z818 Family history of other mental and behavioral disorders: Secondary | ICD-10-CM

## 2016-08-24 DIAGNOSIS — E785 Hyperlipidemia, unspecified: Secondary | ICD-10-CM

## 2016-08-24 DIAGNOSIS — Z87891 Personal history of nicotine dependence: Secondary | ICD-10-CM

## 2016-08-24 DIAGNOSIS — M1990 Unspecified osteoarthritis, unspecified site: Secondary | ICD-10-CM

## 2016-08-24 DIAGNOSIS — Z79899 Other long term (current) drug therapy: Secondary | ICD-10-CM

## 2016-08-24 DIAGNOSIS — Z791 Long term (current) use of non-steroidal anti-inflammatories (NSAID): Secondary | ICD-10-CM | POA: Diagnosis not present

## 2016-08-24 DIAGNOSIS — Z881 Allergy status to other antibiotic agents status: Secondary | ICD-10-CM | POA: Diagnosis not present

## 2016-08-24 NOTE — Patient Instructions (Addendum)
1. Continue duloxetine 60 mg daily 2. Continue quetiapine 25 mg at night 3. Try to avoid taking Xanax if you are able to  4. Return to clinic in one month for 30 mins 5. Referral to therapy 6. Contact emergency resources- 911, ED, suicide crisis line [phone]) if any worsening in suicidal thought 7. Consider support group as below  [place] Date/time: Third Tuesday of each month, 7 p.m. Location: [facility]. [facility], [place] Provides education and support and practical information for coping with arthritis for arthritis sufferers and their families. When: Noon - 1:30 p.m. the second Monday of each month, March through December Info: Call Rehabilitation Services at [phone]

## 2016-08-26 DIAGNOSIS — F419 Anxiety disorder, unspecified: Secondary | ICD-10-CM | POA: Diagnosis not present

## 2016-08-26 DIAGNOSIS — F320 Major depressive disorder, single episode, mild: Secondary | ICD-10-CM | POA: Diagnosis not present

## 2016-08-26 DIAGNOSIS — Z125 Encounter for screening for malignant neoplasm of prostate: Secondary | ICD-10-CM | POA: Diagnosis not present

## 2016-08-26 DIAGNOSIS — G4700 Insomnia, unspecified: Secondary | ICD-10-CM | POA: Diagnosis not present

## 2016-08-26 DIAGNOSIS — M1990 Unspecified osteoarthritis, unspecified site: Secondary | ICD-10-CM | POA: Diagnosis not present

## 2016-08-26 DIAGNOSIS — E785 Hyperlipidemia, unspecified: Secondary | ICD-10-CM | POA: Diagnosis not present

## 2016-09-07 ENCOUNTER — Encounter (HOSPITAL_COMMUNITY): Payer: Self-pay | Admitting: Licensed Clinical Social Worker

## 2016-09-07 ENCOUNTER — Ambulatory Visit (INDEPENDENT_AMBULATORY_CARE_PROVIDER_SITE_OTHER): Payer: Medicare Other | Admitting: Licensed Clinical Social Worker

## 2016-09-07 DIAGNOSIS — F4321 Adjustment disorder with depressed mood: Secondary | ICD-10-CM

## 2016-09-07 NOTE — Progress Notes (Signed)
Comprehensive Clinical Assessment (CCA) Note  09/07/2016 Albert Gonzalez [id number]  Visit Diagnosis:      ICD-10-CM   1. Adjustment disorder with depressed mood F43.21       CCA Part One  Part One has been completed on paper by the patient.  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, stress related to care taking, trouble falling asleep Collateral Involvement: None  Individual's Strengths: Fixing things, Games developer, Psychologist, occupational, serve at [employer], helps neighbors, caring person, loves music, hardworker  Individual's Preferences: Likes to stay busy, serve others  Individual's Abilities: Games developer, cares for others, hardworker Type of Services Patient Feels Are Needed: Individual therapy, Medication management  Initial Clinical Notes/Concerns: Symptoms started around age 89 when step son moved back in the home but symptoms have increased over the last year, symptoms occurs daily, symptoms are mild to moderate   Mental Health Symptoms Depression:  Depression: Sleep (too much or little)  Mania:  Mania: N/A  Anxiety:   Anxiety: Worrying  Psychosis:  Psychosis: N/A  Trauma:  Trauma: N/A  Obsessions:  Obsessions: N/A  Compulsions:  Compulsions: N/A  Inattention:  Inattention: N/A  Hyperactivity/Impulsivity:  Hyperactivity/Impulsivity: N/A  Oppositional/Defiant Behaviors:  Oppositional/Defiant Behaviors: N/A  Borderline Personality:   Emotional Irregularity: N/A  Other Mood/Personality Symptoms:  Other Mood/Personality Symtpoms: None reported    Mental Status Exam Appearance and self-care  Stature:  Stature: Tall  Weight:  Weight: Thin  Clothing:  Clothing: Casual  Grooming:  Grooming: Normal  Cosmetic use:  Cosmetic Use: None  Posture/gait:  Posture/Gait: Normal  Motor activity:  Motor Activity: Not Remarkable  Sensorium  Attention:  Attention: Normal  Concentration:  Concentration: Normal  Orientation:  Orientation: X5  Recall/memory:  Recall/Memory: Normal  Affect and Mood  Affect:  Affect: Appropriate  Mood:  Mood: Euthymic  Relating  Eye contact:  Eye Contact: Normal  Facial expression:  Facial Expression: Responsive  Attitude toward examiner:  Attitude Toward Examiner: Cooperative  Thought and Language  Speech flow: Speech Flow: Normal  Thought content:  Thought Content: Appropriate to mood and circumstances  Preoccupation:   None  Hallucinations:   None   Organization:   Logical   Transport planner of Knowledge:  Fund of Knowledge: Average  Intelligence:  Intelligence: Average  Abstraction:  Abstraction: Normal  Judgement:  Judgement: Normal  Reality Testing:  Reality Testing: Adequate  Insight:  Insight: Good  Decision Making:  Decision Making: Normal  Social Functioning  Social Maturity:  Social Maturity: Responsible  Social Judgement:  Social Judgement: Normal  Stress  Stressors:  Stressors: Transitions (Care taker for spouse and stepson )  Coping Ability:  Coping Ability: Exhausted  Skill Deficits:   Compassion fatigue   Supports:   Wife   Family and Psychosocial History: Family history Marital status: Married (Patient was married previously) Number of Years Married: 57 What types of issues is patient dealing with in the relationship?: Spouse has health issues and adult step son who  also has health and  mental health issues lives in the home  Additional relationship  information: None reported  Are you sexually active?: No What is your sexual orientation?: Heterosexual  Has your sexual activity been affected by drugs, alcohol, medication, or emotional stress?: Spouse's Medical issues Does patient have children?: Yes How many children?: 2 How is patient's relationship with their children?: Strained relationship with daughter (from first marriage), strained relationship with step son   Childhood History:  Childhood History By whom was/is the patient raised?: Mother Additional childhood history information: Father committed suicide at age 9  Description of patient's relationship with caregiver when they were a child: Good relationship with mother as a child  Patient's description of current relationship with people who raised him/her: Mother is deceased  How were you disciplined when you got in trouble as a child/adolescent?: Spanked  Does patient have siblings?: Yes Number of Siblings: 2 Description of patient's current relationship with siblings: Good relationship with siblings  Did patient suffer any verbal/emotional/physical/sexual abuse as a child?: No Did patient suffer from severe childhood neglect?: No Has patient ever been sexually abused/assaulted/raped as an adolescent or adult?: No Was the patient ever a victim of a crime or a disaster?: No Witnessed domestic violence?: No Has patient been effected by domestic violence as an adult?: No  CCA Part Two B  Employment/Work Situation: Employment / Work Copywriter, advertising Employment situation: Retired Chartered loss adjuster is the longest time patient has a held a job?: 20 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  Has patient ever been in the [employer]?: Yes (Describe in comment) (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[place] ) Has patient ever served in combat?: No Did You Receive Any Psychiatric Treatment/Services While in Passenger transport manager?: No Are There Guns or Other Weapons in [place]?: Yes Types of Guns/Weapons:  Loss adjuster, chartered?: Yes  Education: Education School Currently Attending: N/A: Adult  Last Grade Completed: 12 Name of [place]: [place]  Did Teacher, adult education From [employer]?: Yes Did Physicist, medical?: Yes What Type of College Degree Do you Have?: Associates  Did [place]?: No 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, Respitory Therapy  Did You Have Any Special Interests In School?: None identified  Did You Have An Individualized Education Program (IIEP): No Did You Have Any Difficulty At School?: No  Religion: Religion/Spirituality Are You A Religious Person?: Yes What is Your Religious Affiliation?: Christian How Might This Affect Treatment?: Support in treatment   Leisure/Recreation: Leisure / Recreation Leisure and Hobbies: Used to fish, Designer, jewellery, used to play   Exercise/Diet: Exercise/Diet Do You Exercise?: Yes What Type of Exercise Do You Do?: Other (Comment) ([employer] ) How Many Times a Week Do You Exercise?: 1-3 times a week Have You Gained or Lost A Significant Amount of Weight in the Past Six Months?: No Do You Follow a Special Diet?: No Do You Have Any Trouble Sleeping?: No  CCA Part Two C  Alcohol/Drug Use: Alcohol / Drug Use Pain Medications: See patient record Prescriptions: See patient record Over the Counter: See patient record  History of alcohol / drug use?: No history of alcohol / drug abuse                      CCA Part Three  ASAM's:  Six Dimensions of Multidimensional Assessment  Dimension 1:  Acute Intoxication and/or Withdrawal Potential:  Dimension 1:  Comments: None  Dimension 2:  Biomedical Conditions and Complications:  Dimension  2:  Comments: None  Dimension 3:  Emotional, Behavioral, or Cognitive Conditions and Complications:  Dimension 3:  Comments: None  Dimension 4:  Readiness to Change:  Dimension 4:  Comments: None  Dimension 5:  Relapse, Continued use, or Continued Problem  Potential:  Dimension 5:  Comments: None  Dimension 6:  Recovery/Living Environment:  Dimension 6:  Recovery/Living Environment Comments: None    Substance use Disorder (SUD)    Social Function:  Social Functioning Social Maturity: Responsible Social Judgement: Normal  Stress:  Stress Stressors: Transitions (Care taker for spouse and stepson ) Coping Ability: Exhausted Patient Takes Medications The Way The Doctor Instructed?: Yes Priority Risk: Low Acuity  Risk Assessment- Self-Harm Potential: Risk Assessment For Self-Harm Potential Thoughts of Self-Harm: No current thoughts Method: No plan Availability of Means: No access/NA  Risk Assessment -Dangerous to Others Potential: Risk Assessment For Dangerous to Others Potential Method: No Plan Availability of Means: No access or NA Intent: Vague intent or NA Notification Required: No need or identified person  DSM5 Diagnoses: Patient Active Problem List   Diagnosis Date Noted  . MDD (major depressive disorder), recurrent episode, moderate ([place]) 08/24/2016  . Acute medial meniscus tear of left knee   . Tear of lateral meniscus of right knee, current   . Arthritis of knee, right   . Deficiency of anterior cruciate ligament of right knee   . Abnormal CT scan, colon 02/01/2011  . Constipation 02/01/2011    Patient Centered Plan: Patient is on the following Treatment Plan(s):  Depression  Recommendations for Services/Supports/Treatments: Recommendations for Services/Supports/Treatments Recommendations For Services/Supports/Treatments: Individual Therapy, Medication Management  Treatment Plan Summary:   Patient is a 66 year old Caucasian male that presents oriented x5 (person, place, situation, time and object), alert, well groomed, well dressed, average height, average weight, and cooperative to an assessment on a referral from Dr. Aline Brochure and Dr. Modesta Messing to address mood. Patient has a history of medical treatment including  knee pain and arthritis. Patient has minimal history of mental health treatment including medication management. Patient denies symptoms of mania. He denies suicidal and homicidal ideations. Patient denies psychosis including auditory and visual hallucinations. He denies suicidal ideations. Patient is at low risk for lethality. He is caring for his wife who has severe health issues and his step son who has physical and mental health issues. Patient would benefit from outpatient therapy with a CBT approach 1-4 times a month. Patient would also benefit from continued medication management to manage mood.   Referrals to Alternative Service(s): Referred to Alternative Service(s):   Place:   Date:   Time:    Referred to Alternative Service(s):   Place:   Date:   Time:    Referred to Alternative Service(s):   Place:   Date:   Time:    Referred to Alternative Service(s):   Place:   Date:   Time:     Glori Bickers, LCSW

## 2016-09-15 ENCOUNTER — Other Ambulatory Visit: Payer: Self-pay

## 2016-09-19 NOTE — Progress Notes (Signed)
BH MD/PA/NP OP Progress Note  09/21/2016 3:48 PM Albert Gonzalez  MRN:  [id number]  Chief Complaint:  Chief Complaint    Depression; Follow-up     Subjective:  "I cannot make my wife happier" HPI:  Patient presents for follow up appointment for depression. He states that he bought bicycle for him and his wife. His wife fell from it and broke ribs last week. She is in significant pain and she requires more care. 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 He usually feels better doing volunteer work as he likes to help people. He has concern about his step son in his late 1's who does not take care of his mother. He complains of knee and back pain, which was exacerbated after trying to hold his wife. He believes that he was relatively doing better until the last week.   He reports insomnia. He has good appetite. He is fatigued. He feels less irritable. He denies SI, HI, AH/VH. He feels anxious. He denies panic attacks. He had "bad dreams" and quetiapine was discontinued by his PCP. He takes Xanax three times a day for anxiety.   [place] He is on hydrocodone. Xanax 0.5 mg 360 tabs for 90 days was prescribed on 08/14/2016 with one refill left   Visit Diagnosis:    ICD-10-CM   1. MDD (major depressive disorder), recurrent episode, moderate ([place]) F33.1     Past Psychiatric History:  I have reviewed the patient's psychiatry history in detail and updated the patient record. Outpatient: denies Psychiatry admission: denies Previous suicide attempt:  Past trials of medication: Fluoxetine, duloxetine, pristiq, quetiapine (nightmares), Ambien, Xanax,  History of violence: denies  Past Medical History:  Past Medical History:  Diagnosis Date  . Anxiety   . Arthritis   . Depression   . Hyperlipidemia   . Inguinal hernia right   . Insomnia   . Myalgia     Past  Surgical History:  Procedure Laterality Date  . COLONOSCOPY  02/08/2011   Procedure: COLONOSCOPY;  Surgeon: Daneil Dolin, MD;  Location: [facility];  Service: Endoscopy;  Laterality: N/A;  1:30 PM  . KNEE ARTHROSCOPY WITH MEDIAL MENISECTOMY Right 04/05/2016   Procedure: RIGHT KNEE ARTHROSCOPY WITH PARTIAL  LATERAL MENISECTOMY, DEBRIDEMENT MEDIAL MENISCUS, ACL DEBRIDEMENT, MICRO FRACTURE OF FEMUR;  Surgeon: Carole Civil, MD;  Location: [facility];  Service: Orthopedics;  Laterality: Right;  . KNEE SURGERY     left  . KYPHOPLASTY    . TONSILLECTOMY      Family Psychiatric History:  I have reviewed the patient's family history in detail and updated the patient record. Father- attempted suicide  Family History:  Family History  Problem Relation Age of Onset  . Colon cancer Neg Hx   . Liver disease Neg Hx   . Inflammatory bowel disease Neg Hx   . Anesthesia problems Neg Hx     Social History:  Social History   Social History  . Marital status: Married    Spouse name: N/A  . Number of children: 1  . Years of education: N/A   Occupational History  . respiratory therapist [employer] History Main Topics  . Smoking status: Former Smoker    Packs/day: 0.50    Years: 20.00    Types: Pipe    Quit date:  04/03/1990  . Smokeless tobacco: Never Used     Comment: quit many years ago  . Alcohol use Yes     Comment: occassional  . Drug use: No  . Sexual activity: Yes   Other Topics Concern  . None   Social History Narrative  . None   Married twice, divorced after 21 years, currently married for 16 years, he has a daughter, age 26 year He lives with his wife with RA and his step son with RA, ADD He was born in [place], grew up in [place], reports his father who fought in [place] himself when he was 66 years old. He and his siblings were raised by his mother. He reports good relationship with her. 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 Sulfa Antibiotics Rash    Childhood     Metabolic Disorder Labs: No results found for: HGBA1C, MPG No results found for: PROLACTIN No results found for: CHOL, TRIG, HDL, CHOLHDL, VLDL, LDLCALC   Current Medications: Current Outpatient Prescriptions  Medication Sig Dispense Refill  . aspirin 325 MG tablet Take 325 mg by mouth daily.      Marland Kitchen atorvastatin (LIPITOR) 40 MG tablet Take 40 mg by mouth daily.    . Calcium Citrate-Vitamin D (CALCIUM + D PO) Take 2 tablets by mouth daily.    . cyclobenzaprine (FLEXERIL) 10 MG tablet Take 10 mg by mouth daily. For muscle spasms    . diclofenac (CATAFLAM) 50 MG tablet Take 1 tablet (50 mg total) by mouth 2 (two) times daily. 180 tablet 5  . DULoxetine (CYMBALTA) 60 MG capsule Total of 90 mg daily (60 mg + 30 mg) 30 capsule 1  . HYDROcodone-acetaminophen (NORCO) 7.5-325 MG tablet Take 1 tablet by mouth every 4 (four) hours as needed for moderate pain. 30 tablet 0  . ibuprofen (ADVIL,MOTRIN) 200 MG tablet Take 400 mg by mouth every 6 (six) hours as needed for mild pain. For pain     . Misc Natural Products (GLUCOSAMINE CHOND DOUBLE STR PO) Take 2 tablets by mouth daily.    . Multiple Vitamin (MULTIVITAMIN) tablet Take 1 tablet by mouth daily. Men's 50+    . Multiple Vitamins-Minerals (EQ VISION FORMULA 50+ PO) Take 1 tablet by mouth daily.    . psyllium (METAMUCIL SMOOTH TEXTURE) 28 % packet Take 1 packet by mouth daily.      Marland Kitchen tiZANidine (ZANAFLEX) 4 MG tablet Take 4 mg by mouth every 6 (six) hours as needed for muscle spasms.    . DULoxetine (CYMBALTA) 30 MG capsule Total of 90 mg daily (60 mg + 30 mg) 30 capsule 1  . LORazepam (ATIVAN) 1 MG tablet 0.5-1 mg twice a day as needed for anxiety 60 tablet 0   No current  facility-administered medications for this visit.     Neurologic: Headache: No Seizure: No Paresthesias: No  Musculoskeletal: Strength & Muscle Tone: within normal limits Gait & Station: normal Patient leans: N/A  Psychiatric Specialty Exam: Review of Systems  Musculoskeletal: Positive for back pain and joint pain.  Psychiatric/Behavioral: Positive for depression. Negative for hallucinations, substance abuse and suicidal ideas. The patient is nervous/anxious and has insomnia.     Blood pressure 114/71, pulse 72, height 5' 9.88" (1.775 m), weight 181 lb (82.1  kg).Body mass index is 26.06 kg/m.  General Appearance: Well Groomed  Eye Contact:  Good  Speech:  Clear and Coherent  Volume:  Normal  Mood:  Depressed  Affect:  Appropriate, Congruent and down, tearful  Thought Process:  Coherent and Goal Directed  Orientation:  Full (Time, Place, and Person)  Thought Content: Logical Perceptions: denies AH/VH  Suicidal Thoughts:  No  Homicidal Thoughts:  No  Memory:  Immediate;   Good Recent;   Good Remote;   Good  Judgement:  Good  Insight:  Good  Psychomotor Activity:  Normal  Concentration:  Concentration: Good and Attention Span: Good  Recall:  Good  Fund of Knowledge: Good  Language: Good  Akathisia:  No  Handed:  Right  AIMS (if indicated):  N/A  Assets:  Communication Skills Desire for Improvement  ADL's:  Intact  Cognition: WNL  Sleep:  poor   Assessment TAESEAN RETH is a 66 y.o. year old male with a history of depression, anxiety, dyslipidemia, who presents for follow up appointment for MDD (major depressive disorder), recurrent episode, moderate ([place])  # MDD, moderate, recurrent without psychotic features # r/o PTSD Patient endorses worsening neurovegetative symptoms after an incident of his wife with rib fracture. Will uptitrate duloxetine to target depression and pain, given he had good response to recent uptitration. Quetiapine was discontinued by his PCP  given concern for nightmares. Will start trazodone prn for insomnia. Will switch from Xanax to ativan to avoid risk of dependence/ he will benefit from longer half life. Dicussed risk of oversedation, especially with concomitant use of opioids/trazodone. Validated his concern about his wife. Explored his value and self compassion. Discussed behavioral activation.   Plan 1. Increase duloxetine 90 mg daily 2. Discontinue Xanax 3. Start lorazepam 0.5-1 mg twice a day as needed for anxiety 4. Start Trazodone 25-50 mg at night as needed for sleep 5. Return to clinic in one month for 30 mins (PCP discontinued quetiapine given concern for nightmares) 6. Support group for arthritis was discussed. Information was provided  The patient demonstrates the following risk factors for suicide: Chronic risk factors for suicide include: psychiatric disorder of depression and chronic pain. Acute risk factors for suicide include: unemployment and social withdrawal/isolation. Protective factors for this patient include: responsibility to others (children, family), coping skills and hope for the future. Considering these factors, the overall suicide risk at this point appears to be low. Patient is appropriate for outpatient follow up.  Treatment Plan Summary:Plan as above  The duration of this appointment visit was 30 minutes of face-to-face time with the patient.  Greater than 50% of this time was spent in counseling, explanation of  diagnosis, planning of further management, and coordination of care.  Norman Clay, MD 09/21/2016, 3:48 PM

## 2016-09-20 DIAGNOSIS — M545 Low back pain: Secondary | ICD-10-CM | POA: Diagnosis not present

## 2016-09-20 DIAGNOSIS — F321 Major depressive disorder, single episode, moderate: Secondary | ICD-10-CM | POA: Diagnosis not present

## 2016-09-20 DIAGNOSIS — E785 Hyperlipidemia, unspecified: Secondary | ICD-10-CM | POA: Diagnosis not present

## 2016-09-20 DIAGNOSIS — F419 Anxiety disorder, unspecified: Secondary | ICD-10-CM | POA: Diagnosis not present

## 2016-09-21 ENCOUNTER — Encounter (HOSPITAL_COMMUNITY): Payer: Self-pay | Admitting: Psychiatry

## 2016-09-21 ENCOUNTER — Ambulatory Visit (INDEPENDENT_AMBULATORY_CARE_PROVIDER_SITE_OTHER): Payer: Medicare Other | Admitting: Psychiatry

## 2016-09-21 VITALS — BP 114/71 | HR 72 | Ht 69.88 in | Wt 181.0 lb

## 2016-09-21 DIAGNOSIS — F419 Anxiety disorder, unspecified: Secondary | ICD-10-CM | POA: Diagnosis not present

## 2016-09-21 DIAGNOSIS — F331 Major depressive disorder, recurrent, moderate: Secondary | ICD-10-CM

## 2016-09-21 DIAGNOSIS — G4700 Insomnia, unspecified: Secondary | ICD-10-CM | POA: Diagnosis not present

## 2016-09-21 DIAGNOSIS — E785 Hyperlipidemia, unspecified: Secondary | ICD-10-CM

## 2016-09-21 DIAGNOSIS — Z818 Family history of other mental and behavioral disorders: Secondary | ICD-10-CM | POA: Diagnosis not present

## 2016-09-21 DIAGNOSIS — Z87891 Personal history of nicotine dependence: Secondary | ICD-10-CM | POA: Diagnosis not present

## 2016-09-21 MED ORDER — DULOXETINE HCL 30 MG PO CPEP: ORAL_CAPSULE | ORAL | 1 refills | Status: DC

## 2016-09-21 MED ORDER — DULOXETINE HCL 60 MG PO CPEP: ORAL_CAPSULE | ORAL | 1 refills | Status: DC

## 2016-09-21 MED ORDER — TRAZODONE HCL 50 MG PO TABS: ORAL_TABLET | ORAL | 1 refills | Status: DC

## 2016-09-21 MED ORDER — LORAZEPAM 1 MG PO TABS: ORAL_TABLET | ORAL | 0 refills | Status: DC

## 2016-09-21 NOTE — Patient Instructions (Addendum)
1. Increase duloxetine 90 mg daily 2. Discontinue Xanax 3. Start lorazepam 0.5-1 mg twice a day as needed for anxiety 4. Return to clinic in one month for 30 mins

## 2016-10-03 ENCOUNTER — Encounter (HOSPITAL_COMMUNITY): Payer: Self-pay | Admitting: Licensed Clinical Social Worker

## 2016-10-03 ENCOUNTER — Ambulatory Visit (INDEPENDENT_AMBULATORY_CARE_PROVIDER_SITE_OTHER): Payer: Medicare Other | Admitting: Licensed Clinical Social Worker

## 2016-10-03 DIAGNOSIS — F4321 Adjustment disorder with depressed mood: Secondary | ICD-10-CM | POA: Diagnosis not present

## 2016-10-03 NOTE — Progress Notes (Signed)
   THERAPIST PROGRESS NOTE  Session Time: 3:00 pm-3:45 pm  Participation Level: Active  Behavioral Response: NeatAlertEuthymic  Type of Therapy: Individual Therapy  Treatment Goals addressed: Coping  Interventions: CBT and Solution Focused  Summary: Albert Gonzalez is a 66 y.o. male who presents oriented x5 (person, place, situation, time and object), alert, well groomed, well dressed, average height, average weight, and cooperative to an assessment to address mood. Patient has a history of medical treatment including knee pain and arthritis. Patient has minimal history of mental health treatment including medication management. Patient denies symptoms of mania. He denies suicidal and homicidal ideations. Patient denies psychosis including auditory and visual hallucinations. He denies suicidal ideations. Patient is at low risk for lethality. He is caring for his wife who has severe health issues and his step son who has physical and mental health issues.   Patient had an average score of 10 out of 10 on the Outcome Rating Scale. Patient reports that he has had an improved mood. He notes that the medication has been helpful for him. Patient reports that he has been able to think clearer and not respond emotionally to stressors related to caring for his wife and stepson. Patient reported that he is riding his bike, working on projects around the house including putting in flooring, volunteering at a food bank and is planning on starting water aerobics with his wife whose has health issues. Patient committed to continue to stay active, connected to his community, regulate his sleep and take medication as prescribed. Patient rated session 10 out of 10 on the Session Rating Scale.  Patient engaged in session. He responded well to interventions. Patient continues to meet criteria for Adjustment Disorder with depressed mood. Patient will continue in outpatient therapy due to being the least restrictive  service to meet his needs. Patient made moderate progress on his goals at this time.   Suicidal/Homicidal: Negativewithout intent/plan  Therapist Response: Therapist reviewed patient's recent thoughts and behaviors. Therapist utilized CBT to address mood. Therapist had patient identify improvements in his mood. Therapist assisted patient in identifying ways to continue to experience improved mood. Therapist committed patient to continue to stay active, connected to his community, active in his faith, and take medication as prescribed. Therapist administered the Outcome Rating Scale and the Session Rating Scale.   Plan: Return again in 4 weeks. Therapist will review patient goals on or before 10.12.2018  Diagnosis: Axis I: Adjustment Disorder with Depressed Mood    Axis II: No diagnosis    Glori Bickers, LCSW 10/03/2016

## 2016-10-11 NOTE — Progress Notes (Signed)
[facility] MD/PA/NP OP Progress Note  10/17/2016 3:58 PM Albert Gonzalez  MRN:  [id number]  Chief Complaint:  Chief Complaint    Depression; Follow-up     Subjective:  "I feel leveled out" HPI:  Patient presents for follow up appointment for depression. He states that his wife was found to have pneumonia. He feels good that they could catch it earlier. He feels "leveled" and better since the last encounter. He tries to help her as well as his step son. He is not frustrated by his step son anymore. He volunteers at food bank and at [employer]. He feels more motivated. He has occasional insomnia. He feels less anxious. He takes ativan every day with the thought that it was prescribed as scheduled. He has not taken Xanax. He denies SI, HI, AH/VH.   Per [facility], ativan was prescribed on 09/21/2016  Visit Diagnosis:    ICD-10-CM   1. MDD (major depressive disorder), recurrent episode, moderate ([place]) F33.1     Past Psychiatric History:  I have reviewed the patient's psychiatry history in detail and updated the patient record. Outpatient: denies Psychiatry admission: denies Previous suicide attempt:  Past trials of medication: Fluoxetine, duloxetine, pristiq, quetiapine (nightmares), Ambien, Xanax,  History of violence: denies  Past Medical History:  Past Medical History:  Diagnosis Date  . Anxiety   . Arthritis   . Depression   . Hyperlipidemia   . Inguinal hernia right   . Insomnia   . Myalgia     Past Surgical History:  Procedure Laterality Date  . COLONOSCOPY  02/08/2011   Procedure: COLONOSCOPY;  Surgeon: Daneil Dolin, MD;  Location: [facility];  Service: Endoscopy;  Laterality: N/A;  1:30 PM  . KNEE ARTHROSCOPY WITH MEDIAL MENISECTOMY Right 04/05/2016   Procedure: RIGHT KNEE ARTHROSCOPY WITH PARTIAL  LATERAL MENISECTOMY, DEBRIDEMENT MEDIAL MENISCUS, ACL DEBRIDEMENT, MICRO FRACTURE OF FEMUR;  Surgeon: Carole Civil, MD;  Location: [facility];  Service:  Orthopedics;  Laterality: Right;  . KNEE SURGERY     left  . KYPHOPLASTY    . TONSILLECTOMY      Family Psychiatric History:  I have reviewed the patient's family history in detail and updated the patient record. Father- attempted suicide  Family History:  Family History  Problem Relation Age of Onset  . Colon cancer Neg Hx   . Liver disease Neg Hx   . Inflammatory bowel disease Neg Hx   . Anesthesia problems Neg Hx     Social History:  Social History   Social History  . Marital status: Married    Spouse name: N/A  . Number of children: 1  . Years of education: N/A   Occupational History  . 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 Smoking status: Former Smoker    Packs/day: 0.50    Years: 20.00    Types: Pipe    Quit date: 04/03/1990  . Smokeless tobacco: Never Used     Comment: quit many years ago  . Alcohol use Yes     Comment: occassional  . Drug use: No  . Sexual activity: Yes   Other Topics Concern  . None   Social History Narrative  . None    Allergies:  Allergies  Allergen Reactions  . Sulfa Antibiotics Rash    Childhood     Metabolic Disorder Labs: No results found for: HGBA1C, MPG No results found for: PROLACTIN No results found for:  CHOL, TRIG, HDL, CHOLHDL, VLDL, LDLCALC   Current Medications: Current Outpatient Prescriptions  Medication Sig Dispense Refill  . aspirin 325 MG tablet Take 325 mg by mouth daily.      Marland Kitchen atorvastatin (LIPITOR) 40 MG tablet Take 40 mg by mouth daily.    . Calcium Citrate-Vitamin D (CALCIUM + D PO) Take 2 tablets by mouth daily.    . cyclobenzaprine (FLEXERIL) 10 MG tablet Take 10 mg by mouth daily. For muscle spasms    . diclofenac (CATAFLAM) 50 MG tablet Take 1 tablet (50 mg total) by mouth 2 (two) times daily. 180 tablet 5  . DULoxetine (CYMBALTA) 30 MG capsule Total of 90 mg daily (60 mg + 30 mg) 30 capsule 1  . DULoxetine (CYMBALTA) 60 MG capsule Total of 90 mg  daily (60 mg + 30 mg) 30 capsule 1  . HYDROcodone-acetaminophen (NORCO) 7.5-325 MG tablet Take 1 tablet by mouth every 4 (four) hours as needed for moderate pain. 30 tablet 0  . ibuprofen (ADVIL,MOTRIN) 200 MG tablet Take 400 mg by mouth every 6 (six) hours as needed for mild pain. For pain     . LORazepam (ATIVAN) 0.5 MG tablet Take 1 tablet (0.5 mg total) by mouth 2 (two) times daily as needed for anxiety. 60 tablet 1  . Misc Natural Products (GLUCOSAMINE CHOND DOUBLE STR PO) Take 2 tablets by mouth daily.    . Multiple Vitamin (MULTIVITAMIN) tablet Take 1 tablet by mouth daily. Men's 50+    . Multiple Vitamins-Minerals (EQ VISION FORMULA 50+ PO) Take 1 tablet by mouth daily.    . psyllium (METAMUCIL SMOOTH TEXTURE) 28 % packet Take 1 packet by mouth daily.      Marland Kitchen tiZANidine (ZANAFLEX) 4 MG tablet Take 4 mg by mouth every 6 (six) hours as needed for muscle spasms.    . traZODone (DESYREL) 50 MG tablet 25-50 mg at night as needed for sleep 30 tablet 1   No current facility-administered medications for this visit.     Neurologic: Headache: No Seizure: No Paresthesias: No  Musculoskeletal: Strength & Muscle Tone: within normal limits Gait & Station: normal Patient leans: N/A  Psychiatric Specialty Exam: Review of Systems  Psychiatric/Behavioral: Negative for depression, hallucinations, substance abuse and suicidal ideas. The patient is nervous/anxious and has insomnia.   All other systems reviewed and are negative.   Blood pressure 127/84, pulse 84, height 5' 9.88" (1.775 m), weight 174 lb 12.8 oz (79.3 kg).Body mass index is 25.17 kg/m.  General Appearance: Fairly Groomed  Eye Contact:  Good  Speech:  Clear and Coherent  Volume:  Normal  Mood:  "better"  Affect:  Appropriate, Congruent and Full Range  Thought Process:  Coherent and Goal Directed  Orientation:  Full (Time, Place, and Person)  Thought Content: Logical Perceptions: denies AH/VH  Suicidal Thoughts:  No  Homicidal  Thoughts:  No  Memory:  Immediate;   Good Recent;   Good Remote;   Good  Judgement:  Good  Insight:  Good  Psychomotor Activity:  Normal  Concentration:  Concentration: Good and Attention Span: Good  Recall:  Good  Fund of Knowledge: Good  Language: Good  Akathisia:  No  Handed:  Right  AIMS (if indicated):  N/A  Assets:  Communication Skills Desire for Improvement  ADL's:  Intact  Cognition: WNL  Sleep:  Fair to poor   Assessment Albert Gonzalez is a 66 y.o. year old male with a history of depression, dyslipidemia, who presents for  follow up appointment for MDD (major depressive disorder), recurrent episode, moderate ([place])  # MDD, moderate, recurrent without psychotic features # r/o PTSD There has been significant improvement in neurovegetative symptoms since uptitration of duloxetine. Will continue current dose to target depression and pain. He is reminded to take ativan only as needed; he agrees to try lower dose pr n for anxiety. Discussed risk of oversedation, especially with concomitant use of opioids/trazodone. Will continue trazodone prn for insomnia. Discussed behavioral activation. Discussed caregiver burnout. He will continue to see Mr. Sheets for therapy.  Plan 1. Continue duloxetine 90 mg daily 2. Continue lorazepam 0.5 mg twice a day as needed for anxiety 3. Continue Trazodone 25-50 mg at night as needed for sleep 4. Return to clinic in two months 5. Patient was advised to try support group for arthritis; information was provided at the initial encounter.   The patient demonstrates the following risk factors for suicide: Chronic risk factors for suicide include: psychiatric disorder of depressionand chronic pain. Acute risk factorsfor suicide include: unemployment and social withdrawal/isolation. Protective factorsfor this patient include: responsibility to others (children, family), coping skills and hope for the future. Considering these factors, the overall  suicide risk at this point appears to be low. Patient isappropriate for outpatient follow up.  Treatment Plan Summary:Plan as above  The duration of this appointment visit was 30 minutes of face-to-face time with the patient.  Greater than 50% of this time was spent in counseling, explanation of  diagnosis, planning of further management, and coordination of care.  Norman Clay, MD 10/17/2016, 3:58 PM

## 2016-10-17 ENCOUNTER — Ambulatory Visit (INDEPENDENT_AMBULATORY_CARE_PROVIDER_SITE_OTHER): Payer: Medicare Other | Admitting: Psychiatry

## 2016-10-17 ENCOUNTER — Encounter (HOSPITAL_COMMUNITY): Payer: Self-pay | Admitting: Psychiatry

## 2016-10-17 VITALS — BP 127/84 | HR 84 | Ht 69.88 in | Wt 174.8 lb

## 2016-10-17 DIAGNOSIS — F419 Anxiety disorder, unspecified: Secondary | ICD-10-CM | POA: Diagnosis not present

## 2016-10-17 DIAGNOSIS — Z87891 Personal history of nicotine dependence: Secondary | ICD-10-CM

## 2016-10-17 DIAGNOSIS — F331 Major depressive disorder, recurrent, moderate: Secondary | ICD-10-CM | POA: Diagnosis not present

## 2016-10-17 DIAGNOSIS — E785 Hyperlipidemia, unspecified: Secondary | ICD-10-CM | POA: Diagnosis not present

## 2016-10-17 DIAGNOSIS — G4700 Insomnia, unspecified: Secondary | ICD-10-CM

## 2016-10-17 MED ORDER — TRAZODONE HCL 50 MG PO TABS: ORAL_TABLET | ORAL | 1 refills | Status: DC

## 2016-10-17 MED ORDER — DULOXETINE HCL 60 MG PO CPEP: ORAL_CAPSULE | ORAL | 1 refills | Status: DC

## 2016-10-17 MED ORDER — DULOXETINE HCL 30 MG PO CPEP: ORAL_CAPSULE | ORAL | 1 refills | Status: DC

## 2016-10-17 MED ORDER — LORAZEPAM 0.5 MG PO TABS: 0.5000 mg | ORAL_TABLET | Freq: Two times a day (BID) | ORAL | 1 refills | Status: DC | PRN

## 2016-10-17 NOTE — Patient Instructions (Signed)
1. Continue duloxetine 90 mg daily 2. Continue lorazepam 0.5 mg twice a day as needed for anxiety 3. Continue Trazodone 25-50 mg at night as needed for sleep 4. Return to clinic in two months

## 2016-11-01 DIAGNOSIS — F4310 Post-traumatic stress disorder, unspecified: Secondary | ICD-10-CM | POA: Diagnosis not present

## 2016-11-01 DIAGNOSIS — F321 Major depressive disorder, single episode, moderate: Secondary | ICD-10-CM | POA: Diagnosis not present

## 2016-11-01 DIAGNOSIS — E785 Hyperlipidemia, unspecified: Secondary | ICD-10-CM | POA: Diagnosis not present

## 2016-11-01 DIAGNOSIS — F419 Anxiety disorder, unspecified: Secondary | ICD-10-CM | POA: Diagnosis not present

## 2016-11-06 ENCOUNTER — Ambulatory Visit (INDEPENDENT_AMBULATORY_CARE_PROVIDER_SITE_OTHER): Payer: Medicare Other | Admitting: Licensed Clinical Social Worker

## 2016-11-06 DIAGNOSIS — F4321 Adjustment disorder with depressed mood: Secondary | ICD-10-CM

## 2016-11-06 NOTE — Progress Notes (Signed)
   THERAPIST PROGRESS NOTE  Session Time: 3:45 pm-4:30 pm  Participation Level: Active  Behavioral Response: NeatAlertEuthymic  Type of Therapy: Individual Therapy  Treatment Goals addressed: Coping  Interventions: CBT and Solution Focused  Summary: Albert Gonzalez is a 66 y.o. male who presents oriented x5 (person, place, situation, time and object), alert, well groomed, well dressed, average height, average weight, and cooperative to an assessment to address mood. Patient has a history of medical treatment including knee pain and arthritis. Patient has minimal history of mental health treatment including medication management. Patient denies symptoms of mania. He denies suicidal and homicidal ideations. Patient denies psychosis including auditory and visual hallucinations. He denies suicidal ideations. Patient is at low risk for lethality. He is caring for his wife who has severe health issues and his step son who has physical and mental health issues.   Patient had an average score of 10 out of 10 on the Outcome Rating Scale. Patient reports that he continues to do well. He continues to stay active, change his perspective/thinking on situations, remain active in his faith and volunteer. Patient discussed that despite his wife falling and bruising herself he has managed his stress and he has decided not to get upset over what his step son does or doesn't do. Patient reported that he has not exercised lately but after discussion decided to ride his bike in her morning before his wife gets out of bed. Patient committed to ride his bike in the morning for exercise, take his medication as prescribed, continue to volunteer, remain active in his faith, and volunteer. Patient rated session 10 out of 10 on the Session Rating Scale.  Patient engaged in session. He responded well to interventions. Patient continues to meet criteria for Adjustment Disorder with depressed mood. Patient will continue in  outpatient therapy due to being the least restrictive service to meet his needs. Patient made moderate progress on his goals at this time.   Suicidal/Homicidal: Negativewithout intent/plan  Therapist Response: Therapist reviewed patient's recent thoughts and behaviors. Therapist utilized CBT to address mood. Therapist followed up on patient's homework. Therapist discussed with patient his improvements and additional ways to improve his mood to continue to progress. Therapist committed patient to ride his bike in the morning, volunteer, active in his faith, and take medication as prescribed. Therapist administered the Outcome Rating Scale and the Session Rating Scale.   Plan: Return again in 4 weeks. Therapist will review patient goals on or before 10.12.2018  Diagnosis: Axis I: Adjustment Disorder with Depressed Mood    Axis II: No diagnosis    Glori Bickers, LCSW 11/06/2016

## 2016-11-28 ENCOUNTER — Ambulatory Visit (INDEPENDENT_AMBULATORY_CARE_PROVIDER_SITE_OTHER): Payer: Medicare Other | Admitting: Licensed Clinical Social Worker

## 2016-11-28 DIAGNOSIS — F4321 Adjustment disorder with depressed mood: Secondary | ICD-10-CM | POA: Diagnosis not present

## 2016-11-28 NOTE — Progress Notes (Signed)
   THERAPIST PROGRESS NOTE  Session Time: 3:45 pm-4:30 pm  Participation Level: Active  Behavioral Response: NeatAlertEuthymic  Type of Therapy: Individual Therapy  Treatment Goals addressed: Coping  Interventions: CBT and Solution Focused  Summary: Albert Gonzalez is a 66 y.o. male who presents oriented x5 (person, place, situation, time and object), alert, well groomed, well dressed, average height, average weight, and cooperative to an assessment to address mood. Patient has a history of medical treatment including knee pain and arthritis. Patient has minimal history of mental health treatment including medication management. Patient denies symptoms of mania. He denies suicidal and homicidal ideations. Patient denies psychosis including auditory and visual hallucinations. He denies suicidal ideations. Patient is at low risk for lethality. He is caring for his wife who has severe health issues and his step son who has physical and mental health issues.   Patient had an average score of 10 out of 10 on the Outcome Rating Scale. Patient reports that he continues to do well. Patient is continuing to do the things necessary to maintain his mood including take medication as prescribed, be active, volunteer and take time for himself. Patient felt that he has achieved his goals and will discharge from outpatient therapy. Patient rated session 10 out of 10 on the Session Rating Scale.  Patient engaged in session. He responded well to interventions. Patient continues to meet criteria for Adjustment Disorder with depressed mood. Patient  discontinue in outpatient therapy due to being the least restrictive service to meet his needs. Patient achieved his goals.   Suicidal/Homicidal: Negativewithout intent/plan  Therapist Response: Therapist reviewed patient's recent thoughts and behaviors. Therapist utilized CBT to address mood. Therapist reviewed patient goals and progress. Therapist committed patient  to continue to manage his mood. Therapist administered the Outcome Rating Scale and the Session Rating Scale.   Plan: Patient will discharge from outpatient therapy, he will continue in medication management.   Diagnosis: Axis I: Adjustment Disorder with Depressed Mood    Axis II: No diagnosis    Glori Bickers, LCSW 11/28/2016

## 2016-12-11 NOTE — Progress Notes (Signed)
[facility] MD/PA/NP OP Progress Note  12/14/2016 2:12 PM OTHON Gonzalez  MRN:  [id number]  Chief Complaint:  Chief Complaint    Depression; Follow-up     HPI:  Patient presents for follow up appointment for depression. He states that he has been doing well since the last visit. His wife had issues with indigestion and has been in bed due to worsening pain with arthritis. He has been able to support her. 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 He is also very active in church. He had a bicycle and uses it. He received Xanax by mistake, although he has not used any. He uses ativan prn a few times per week for anxiety. He denies feeling depressed. He has good appetite. He sleeps well with Trazodone. He denies SI. He denies panic attacks.   Wt Readings from Last 3 Encounters:  12/14/16 176 lb (79.8 kg)  10/17/16 174 lb 12.8 oz (79.3 kg)  09/21/16 181 lb (82.1 kg)    Per PMP, Ativan filled on 10/21/2016 Xanax 0.5 mg 360 tabs for 90 days filled on 11/07/2016   Visit Diagnosis:    ICD-10-CM   1. MDD (major depressive disorder), recurrent episode, moderate ([place]) F33.1     Past Psychiatric History:  I have reviewed the patient's psychiatry history in detail and updated the patient record. Outpatient: denies Psychiatry admission: denies Previous suicide attempt:  Past trials of medication: Fluoxetine, duloxetine, pristiq, quetiapine (nightmares), Ambien, Xanax,  History of violence: denies   Past Medical History:  Past Medical History:  Diagnosis Date  . Anxiety   . Arthritis   . Depression   . Hyperlipidemia   . Inguinal hernia right   . Insomnia   . Myalgia     Past Surgical History:  Procedure Laterality Date  . COLONOSCOPY  02/08/2011   Procedure: COLONOSCOPY;  Surgeon: Daneil Dolin, MD;  [facility];  Service: Endoscopy;  Laterality: N/A;  1:30 PM  . KNEE ARTHROSCOPY WITH MEDIAL MENISECTOMY Right 04/05/2016   Procedure:  RIGHT KNEE ARTHROSCOPY WITH PARTIAL  LATERAL MENISECTOMY, DEBRIDEMENT MEDIAL MENISCUS, ACL DEBRIDEMENT, MICRO FRACTURE OF FEMUR;  Surgeon: Carole Civil, MD;  Location: [facility];  Service: Orthopedics;  Laterality: Right;  . KNEE SURGERY     left  . KYPHOPLASTY    . TONSILLECTOMY      Family Psychiatric History:  I have reviewed the patient's family history in detail and updated the patient record.  Family History:  Family History  Problem Relation Age of Onset  . Suicidality Father   . Colon cancer Neg Hx   . Liver disease Neg Hx   . Inflammatory bowel disease Neg Hx   . Anesthesia problems Neg Hx     Social History:  Social History   Social History  . Marital status: Married    Spouse name: N/A  . Number of children: 1  . Years of education: N/A   Occupational History  . 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 Smoking status: Former Smoker    Packs/day: 0.50    Years: 20.00    Types: Pipe    Quit date: 04/03/1990  . Smokeless tobacco: Never Used     Comment: quit many years ago  . Alcohol use Yes     Comment: occassional  . Drug use: No  . Sexual activity: Yes   Other Topics Concern  . Not on file  Social History Narrative  . No narrative on file    Allergies:  Allergies  Allergen Reactions  . Sulfa Antibiotics Rash    Childhood     Metabolic Disorder Labs: No results found for: HGBA1C, MPG No results found for: PROLACTIN No results found for: CHOL, TRIG, HDL, CHOLHDL, VLDL, LDLCALC No results found for: TSH  Therapeutic Level Labs: No results found for: LITHIUM No results found for: VALPROATE No components found for:  CBMZ  Current Medications: Current Outpatient Prescriptions  Medication Sig Dispense Refill  . aspirin 325 MG tablet Take 325 mg by mouth daily.      Marland Kitchen atorvastatin (LIPITOR) 40 MG tablet Take 40 mg by mouth daily.    . Calcium Citrate-Vitamin D (CALCIUM + D PO) Take 2 tablets by  mouth daily.    . cyclobenzaprine (FLEXERIL) 10 MG tablet Take 10 mg by mouth daily. For muscle spasms    . diclofenac (CATAFLAM) 50 MG tablet Take 1 tablet (50 mg total) by mouth 2 (two) times daily. 180 tablet 5  . DULoxetine (CYMBALTA) 30 MG capsule Total of 90 mg daily (60 mg + 30 mg) 90 capsule 0  . DULoxetine (CYMBALTA) 60 MG capsule Total of 90 mg daily (60 mg + 30 mg) 90 capsule 0  . HYDROcodone-acetaminophen (NORCO) 7.5-325 MG tablet Take 1 tablet by mouth every 4 (four) hours as needed for moderate pain. 30 tablet 0  . ibuprofen (ADVIL,MOTRIN) 200 MG tablet Take 400 mg by mouth every 6 (six) hours as needed for mild pain. For pain     . Misc Natural Products (GLUCOSAMINE CHOND DOUBLE STR PO) Take 2 tablets by mouth daily.    . Multiple Vitamin (MULTIVITAMIN) tablet Take 1 tablet by mouth daily. Men's 50+    . Multiple Vitamins-Minerals (EQ VISION FORMULA 50+ PO) Take 1 tablet by mouth daily.    . psyllium (METAMUCIL SMOOTH TEXTURE) 28 % packet Take 1 packet by mouth daily.      Marland Kitchen tiZANidine (ZANAFLEX) 4 MG tablet Take 4 mg by mouth every 6 (six) hours as needed for muscle spasms.    . traZODone (DESYREL) 50 MG tablet 25-50 mg at night as needed for sleep 90 tablet 0   No current facility-administered medications for this visit.      Musculoskeletal: Strength & Muscle Tone: within normal limits Gait & Station: normal Patient leans: N/A  Psychiatric Specialty Exam: Review of Systems  Psychiatric/Behavioral: Negative for depression, hallucinations, substance abuse and suicidal ideas. The patient is nervous/anxious and has insomnia.   All other systems reviewed and are negative.   Blood pressure 132/78, pulse 74, height 5' 9.88" (1.775 m), weight 176 lb (79.8 kg).Body mass index is 25.34 kg/m.  General Appearance: Fairly Groomed  Eye Contact:  Good  Speech:  Clear and Coherent  Volume:  Normal  Mood:  "better"  Affect:  Appropriate, Congruent and Full Range  Thought Process:   Coherent and Goal Directed  Orientation:  Full (Time, Place, and Person)  Thought Content: Logical Perceptions: denies AH/VH  Suicidal Thoughts:  No  Homicidal Thoughts:  No  Memory:  Immediate;   Good Recent;   Good Remote;   Good  Judgement:  Good  Insight:  Good  Psychomotor Activity:  Normal  Concentration:  Concentration: Good and Attention Span: Good  Recall:  Good  Fund of Knowledge: Good  Language: Good  Akathisia:  No  Handed:  Right  AIMS (if indicated): not done  Assets:  Communication Skills  Desire for Improvement  ADL's:  Intact  Cognition: WNL  Sleep:  Good on trazodone   Screenings:   Assessment and Plan:  AIRAM HEIDECKER is a 66 y.o. year old male with a history of depression, dyslipidemia, who presents for follow up appointment for MDD (major depressive disorder), recurrent episode, moderate ([place])  # MDD, moderate, recurrent without psychotic features # r/o PTSD There has been significant improvement in neurovegetative symptoms since uptitration of duloxetine. Will continue current dose to target depression and pain. He will be back to Xanax given he recently had three months refill; he is advised to take only as needed. Will continue trazodone prn for insomnia. Discussed behavioral activation.   Plan 1. Continue duloxetine 90 mg daily  2. He received a refill of Xanax by PCP- he will be back on this medication this time. He will be back to lorazepam 0.5 mg twice a day as needed at the next visit.  3. Continue Trazodone 25-50 mg at night as needed for sleep 4. Return to clinic in three months   The patient demonstrates the following risk factors for suicide: Chronic risk factors for suicide include: psychiatric disorder of depressionand chronic pain. Acute risk factorsfor suicide include: unemployment and social withdrawal/isolation. Protective factorsfor this patient include: responsibility to others (children, family), coping skills and hope for the  future. Considering these factors, the overall suicide risk at this point appears to be low. Patient isappropriate for outpatient follow up.   Norman Clay, MD 12/14/2016, 2:12 PM

## 2016-12-14 ENCOUNTER — Encounter (HOSPITAL_COMMUNITY): Payer: Self-pay | Admitting: Psychiatry

## 2016-12-14 ENCOUNTER — Ambulatory Visit (INDEPENDENT_AMBULATORY_CARE_PROVIDER_SITE_OTHER): Payer: Medicare Other | Admitting: Psychiatry

## 2016-12-14 VITALS — BP 132/78 | HR 74 | Ht 69.88 in | Wt 176.0 lb

## 2016-12-14 DIAGNOSIS — Z79899 Other long term (current) drug therapy: Secondary | ICD-10-CM

## 2016-12-14 DIAGNOSIS — G8929 Other chronic pain: Secondary | ICD-10-CM

## 2016-12-14 DIAGNOSIS — Z87891 Personal history of nicotine dependence: Secondary | ICD-10-CM

## 2016-12-14 DIAGNOSIS — E785 Hyperlipidemia, unspecified: Secondary | ICD-10-CM | POA: Diagnosis not present

## 2016-12-14 DIAGNOSIS — F331 Major depressive disorder, recurrent, moderate: Secondary | ICD-10-CM | POA: Diagnosis not present

## 2016-12-14 DIAGNOSIS — G4700 Insomnia, unspecified: Secondary | ICD-10-CM

## 2016-12-14 MED ORDER — LORAZEPAM 0.5 MG PO TABS: 0.5000 mg | ORAL_TABLET | Freq: Two times a day (BID) | ORAL | 0 refills | Status: DC | PRN

## 2016-12-14 MED ORDER — TRAZODONE HCL 50 MG PO TABS: ORAL_TABLET | ORAL | 0 refills | Status: DC

## 2016-12-14 MED ORDER — DULOXETINE HCL 60 MG PO CPEP: ORAL_CAPSULE | ORAL | 0 refills | Status: DC

## 2016-12-14 MED ORDER — DULOXETINE HCL 30 MG PO CPEP: ORAL_CAPSULE | ORAL | 0 refills | Status: DC

## 2016-12-14 NOTE — Patient Instructions (Addendum)
1. Continue duloxetine 90 mg daily  2. Continue lorazepam 0.5 mg twice a day as needed for anxiety  (Please return Xanax to pharmacy) 3. Continue Trazodone 25-50 mg at night as needed for sleep 4. Return to clinic in three months

## 2016-12-20 DIAGNOSIS — F321 Major depressive disorder, single episode, moderate: Secondary | ICD-10-CM | POA: Diagnosis not present

## 2016-12-20 DIAGNOSIS — K4090 Unilateral inguinal hernia, without obstruction or gangrene, not specified as recurrent: Secondary | ICD-10-CM | POA: Diagnosis not present

## 2016-12-20 DIAGNOSIS — E785 Hyperlipidemia, unspecified: Secondary | ICD-10-CM | POA: Diagnosis not present

## 2016-12-20 DIAGNOSIS — M1990 Unspecified osteoarthritis, unspecified site: Secondary | ICD-10-CM | POA: Diagnosis not present

## 2017-01-02 ENCOUNTER — Ambulatory Visit (INDEPENDENT_AMBULATORY_CARE_PROVIDER_SITE_OTHER): Payer: Medicare Other | Admitting: General Surgery

## 2017-01-02 ENCOUNTER — Encounter: Payer: Self-pay | Admitting: General Surgery

## 2017-01-02 VITALS — BP 134/72 | HR 87 | Temp 98.0°F | Resp 18 | Ht 71.0 in | Wt 175.0 lb

## 2017-01-02 DIAGNOSIS — K4090 Unilateral inguinal hernia, without obstruction or gangrene, not specified as recurrent: Secondary | ICD-10-CM | POA: Diagnosis not present

## 2017-01-02 NOTE — Progress Notes (Signed)
[facility] History and Physical  Reason for Referral:Right inguinal hernia  Referring Physician: Dr. Luan Pulling  Chief Complaint    Inguinal Hernia      Albert Gonzalez is a 66 y.o. male.  HPI: Albert Gonzalez is a very pleasant 66 yo with a known right inguinal hernia that has been present for several years, but given his wife's health he has opted to not fix it prior to this time period. He was recently seen by Dr. Luan Pulling, and given the size and continued discomfort he was sent to have the hernia repaired. He denies ever having any non reducible hernia, or any hard swelling in the area. He has bowel movements, and has never experienced any obstructive symptoms.  He has never had any heart issues or breathing issues, and is currently retired from being a [employer] scientist.   He notices the hernia more when he is more active and doing chores or activities outside.   Past Medical History:  Diagnosis Date  . Anxiety   . Arthritis   . Depression   . Hyperlipidemia   . Inguinal hernia right   . Insomnia   . Myalgia     Past Surgical History:  Procedure Laterality Date  . KNEE SURGERY     left  . KYPHOPLASTY    . TONSILLECTOMY      Family History  Problem Relation Age of Onset  . Suicidality Father   . Colon cancer Neg Hx   . Liver disease Neg Hx   . Inflammatory bowel disease Neg Hx   . Anesthesia problems Neg Hx     Social History   Tobacco Use  . Smoking status: Former Smoker    Packs/day: 0.50    Years: 20.00    Pack years: 10.00    Types: Pipe    Last attempt to quit: 04/03/1990    Years since quitting: 26.7  . Smokeless tobacco: Never Used  . Tobacco comment: quit many years ago  Substance Use Topics  . Alcohol use: Yes    Comment: occassional  . Drug use: No    Medications: I have reviewed the patient's current medications. Allergies as of 01/02/2017      Reactions   Sulfa Antibiotics Rash   Childhood       Medication List        Accurate as of  01/02/17 12:58 PM. Always use your most recent med list.          aspirin 325 MG tablet Take 325 mg by mouth daily.   atorvastatin 40 MG tablet Commonly known as:  LIPITOR Take 40 mg by mouth daily.   CALCIUM + D PO Take 2 tablets by mouth daily.   cyclobenzaprine 10 MG tablet Commonly known as:  FLEXERIL Take 10 mg by mouth daily. For muscle spasms   diclofenac 50 MG tablet Commonly known as:  CATAFLAM Take 1 tablet (50 mg total) by mouth 2 (two) times daily.   DULoxetine 60 MG capsule Commonly known as:  CYMBALTA Total of 90 mg daily (60 mg + 30 mg)   DULoxetine 30 MG capsule Commonly known as:  CYMBALTA Total of 90 mg daily (60 mg + 30 mg)   EQ VISION FORMULA 50+ PO Take 1 tablet by mouth daily.   GLUCOSAMINE CHOND DOUBLE STR PO Take 2 tablets by mouth daily.   HYDROcodone-acetaminophen 7.5-325 MG tablet Commonly known as:  NORCO Take 1 tablet by mouth every 4 (four) hours as needed for moderate  pain.   ibuprofen 200 MG tablet Commonly known as:  ADVIL,MOTRIN Take 400 mg by mouth every 6 (six) hours as needed for mild pain. For pain   multivitamin tablet Take 1 tablet by mouth daily. Men's 50+   psyllium 28 % packet Commonly known as:  METAMUCIL SMOOTH TEXTURE Take 1 packet by mouth daily.   tiZANidine 4 MG tablet Commonly known as:  ZANAFLEX Take 4 mg by mouth every 6 (six) hours as needed for muscle spasms.   traZODone 50 MG tablet Commonly known as:  DESYREL 25-50 mg at night as needed for sleep        ROS:  A comprehensive review of systems was negative except for: Gastrointestinal: positive for discomfort at inguinal region on right Musculoskeletal: positive for myalgias and stiff joints  Blood pressure 134/72, pulse 87, temperature 98 F (36.7 C), resp. rate 18, height 5\' 11"  (1.803 m), weight 175 lb (79.4 kg). Physical Exam  Constitutional: He is oriented to person, place, and time and well-developed, well-nourished, and in no distress.    HENT:  Head: Normocephalic.  Eyes: Pupils are equal, round, and reactive to light.  Cardiovascular: Normal rate and regular rhythm.  Pulmonary/Chest: Effort normal and breath sounds normal.  Abdominal: Soft. He exhibits no distension. There is no tenderness. A hernia is present. Hernia confirmed positive in the right inguinal area.  Reducible, right inguinal hernia, no hernia on left  Musculoskeletal: Normal range of motion.  Neurological: He is alert and oriented to person, place, and time.  Skin: Skin is warm and dry.  Psychiatric: Mood, memory, affect and judgment normal.  Vitals reviewed.   Results: None  Assessment & Plan:  Albert Gonzalez is a 66 y.o. male with a right inguinal hernia that has been causing discomfort and increasing in size. He is to a point where he is ready to get this fixed both socially and from a physical standpoint.  He would like to get this done in December.    -OR 12/3 for right inguinal hernia repair with mesh    All questions were answered to the satisfaction of the patient.  The risk and benefits of right inguinal hernia repair with mesh were discussed including but not limited to bleeding, infection, hernia recurrence, mesh infection, numbness over thigh or nerve injury, urinary retention after surgery.  After careful consideration, Albert Gonzalez has decided to proceed with repair.     Virl Cagey 01/02/2017, 12:58 PM

## 2017-01-02 NOTE — H&P (Signed)
[facility] History and Physical  Reason for Referral:Right inguinal hernia  Referring Physician: Dr. Luan Pulling  Chief Complaint    Inguinal Hernia      Albert Gonzalez is a 66 y.o. male.  HPI: Albert Gonzalez is a very pleasant 66 yo with a known right inguinal hernia that has been present for several years, but given his wife's health he has opted to not fix it prior to this time period. He was recently seen by Dr. Luan Pulling, and given the size and continued discomfort he was sent to have the hernia repaired. He denies ever having any non reducible hernia, or any hard swelling in the area. He has bowel movements, and has never experienced any obstructive symptoms.  He has never had any heart issues or breathing issues, and is currently retired from being a [employer].   He notices the hernia more when he is more active and doing chores or activities outside.   Past Medical History:  Diagnosis Date  . Anxiety   . Arthritis   . Depression   . Hyperlipidemia   . Inguinal hernia right   . Insomnia   . Myalgia     Past Surgical History:  Procedure Laterality Date  . KNEE SURGERY     left  . KYPHOPLASTY    . TONSILLECTOMY      Family History  Problem Relation Age of Onset  . Suicidality Father   . Colon cancer Neg Hx   . Liver disease Neg Hx   . Inflammatory bowel disease Neg Hx   . Anesthesia problems Neg Hx     Social History   Tobacco Use  . Smoking status: Former Smoker    Packs/day: 0.50    Years: 20.00    Pack years: 10.00    Types: Pipe    Last attempt to quit: 04/03/1990    Years since quitting: 26.7  . Smokeless tobacco: Never Used  . Tobacco comment: quit many years ago  Substance Use Topics  . Alcohol use: Yes    Comment: occassional  . Drug use: No    Medications: I have reviewed the patient's current medications. Allergies as of 01/02/2017      Reactions   Sulfa Antibiotics Rash   Childhood       Medication List        Accurate as of  01/02/17 12:58 PM. Always use your most recent med list.          aspirin 325 MG tablet Take 325 mg by mouth daily.   atorvastatin 40 MG tablet Commonly known as:  LIPITOR Take 40 mg by mouth daily.   CALCIUM + D PO Take 2 tablets by mouth daily.   cyclobenzaprine 10 MG tablet Commonly known as:  FLEXERIL Take 10 mg by mouth daily. For muscle spasms   diclofenac 50 MG tablet Commonly known as:  CATAFLAM Take 1 tablet (50 mg total) by mouth 2 (two) times daily.   DULoxetine 60 MG capsule Commonly known as:  CYMBALTA Total of 90 mg daily (60 mg + 30 mg)   DULoxetine 30 MG capsule Commonly known as:  CYMBALTA Total of 90 mg daily (60 mg + 30 mg)   EQ VISION FORMULA 50+ PO Take 1 tablet by mouth daily.   GLUCOSAMINE CHOND DOUBLE STR PO Take 2 tablets by mouth daily.   HYDROcodone-acetaminophen 7.5-325 MG tablet Commonly known as:  NORCO Take 1 tablet by mouth every 4 (four) hours as needed for moderate  pain.   ibuprofen 200 MG tablet Commonly known as:  ADVIL,MOTRIN Take 400 mg by mouth every 6 (six) hours as needed for mild pain. For pain   multivitamin tablet Take 1 tablet by mouth daily. Men's 50+   psyllium 28 % packet Commonly known as:  METAMUCIL SMOOTH TEXTURE Take 1 packet by mouth daily.   tiZANidine 4 MG tablet Commonly known as:  ZANAFLEX Take 4 mg by mouth every 6 (six) hours as needed for muscle spasms.   traZODone 50 MG tablet Commonly known as:  DESYREL 25-50 mg at night as needed for sleep        ROS:  A comprehensive review of systems was negative except for: Gastrointestinal: positive for discomfort at inguinal region on right Musculoskeletal: positive for myalgias and stiff joints  Blood pressure 134/72, pulse 87, temperature 98 F (36.7 C), resp. rate 18, height 5\' 11"  (1.803 m), weight 175 lb (79.4 kg). Physical Exam  Constitutional: He is oriented to person, place, and time and well-developed, well-nourished, and in no distress.    HENT:  Head: Normocephalic.  Eyes: Pupils are equal, round, and reactive to light.  Cardiovascular: Normal rate and regular rhythm.  Pulmonary/Chest: Effort normal and breath sounds normal.  Abdominal: Soft. He exhibits no distension. There is no tenderness. A hernia is present. Hernia confirmed positive in the right inguinal area.  Reducible, right inguinal hernia, no hernia on left  Musculoskeletal: Normal range of motion.  Neurological: He is alert and oriented to person, place, and time.  Skin: Skin is warm and dry.  Psychiatric: Mood, memory, affect and judgment normal.  Vitals reviewed.   Results: None  Assessment & Plan:  Albert Gonzalez is a 66 y.o. male with a right inguinal hernia that has been causing discomfort and increasing in size. He is to a point where he is ready to get this fixed both socially and from a physical standpoint.  He would like to get this done in December.    -OR 12/3 for right inguinal hernia repair with mesh    All questions were answered to the satisfaction of the patient.  The risk and benefits of right inguinal hernia repair with mesh were discussed including but not limited to bleeding, infection, hernia recurrence, mesh infection, numbness over thigh or nerve injury, urinary retention after surgery.  After careful consideration, Albert Gonzalez has decided to proceed with repair.     Virl Cagey 01/02/2017, 12:58 PM

## 2017-01-23 NOTE — Patient Instructions (Signed)
Albert Gonzalez  01/23/2017     @PREFPERIOPPHARMACY @   Your procedure is scheduled on  01/29/2017 .  Report to [facility] at  615  A.M.  Call this number if you have problems the morning of surgery:  [phone]   Remember:  Do not eat food or drink liquids after midnight.  Take these medicines the morning of surgery with A SIP OF WATER  Xanax, cymbalta, hydrocodone, zanaflex.   Do not wear jewelry, make-up or nail polish.  Do not wear lotions, powders, or perfumes, or deoderant.  Do not shave 48 hours prior to surgery.  Men may shave face and neck.  Do not bring valuables to the hospital.  [facility] is not responsible for any belongings or valuables.  Contacts, dentures or bridgework may not be worn into surgery.  Leave your suitcase in the car.  After surgery it may be brought to your room.  For patients admitted to the hospital, discharge time will be determined by your treatment team.  Patients discharged the day of surgery will not be allowed to drive home.   Name and phone number of your driver:   Family Special instructions:  None  Please read over the following fact sheets that you were given. Anesthesia Post-op Instructions and Care and Recovery After Surgery       Open Hernia Repair, Adult Open hernia repair is a surgical procedure to fix a hernia. A hernia occurs when an internal organ or tissue pushes out through a weak spot in the abdominal wall muscles. Hernias commonly occur in the groin and around the navel. Most hernias tend to get worse over time. Often, surgery is done to prevent the hernia from becoming bigger, uncomfortable, or an emergency. Emergency surgery may be needed if abdominal contents get stuck in the opening (incarcerated hernia) or the blood supply gets cut off (strangulated hernia). In an open repair, an incision is made in the abdomen to perform the surgery. Tell a health care provider about:  Any allergies you  have.  All medicines you are taking, including vitamins, herbs, eye drops, creams, and over-the-counter medicines.  Any problems you or family members have had with anesthetic medicines.  Any blood or bone disorders you have.  Any surgeries you have had.  Any medical conditions you have, including any recent cold or flu symptoms.  Whether you are pregnant or may be pregnant. What are the risks? Generally, this is a safe procedure. However, problems may occur, including:  Long-lasting (chronic) pain.  Bleeding.  Infection.  Damage to the testicle. This can cause shrinking or swelling.  Damage to the bladder, blood vessels, intestine, or nerves near the hernia.  Trouble passing urine.  Allergic reactions to medicines.  Return of the hernia.  What happens before the procedure? Staying hydrated Follow instructions from your health care provider about hydration, which may include:  Up to 2 hours before the procedure - you may continue to drink clear liquids, such as water, clear fruit juice, black coffee, and plain tea.  Eating and drinking restrictions Follow instructions from your health care provider about eating and drinking, which may include:  8 hours before the procedure - stop eating heavy meals or foods such as meat, fried foods, or fatty foods.  6 hours before the procedure - stop eating light meals or foods, such as toast or cereal.  6 hours before the procedure - stop drinking milk or  drinks that contain milk.  2 hours before the procedure - stop drinking clear liquids.  Medicines  Ask your health care provider about: ? Changing or stopping your regular medicines. This is especially important if you are taking diabetes medicines or blood thinners. ? Taking medicines such as aspirin and ibuprofen. These medicines can thin your blood. Do not take these medicines before your procedure if your health care provider instructs you not to.  You may be given  antibiotic medicine to help prevent infection. General instructions  You may have blood tests or imaging studies.  Ask your health care provider how your surgical site will be marked or identified.  If you smoke, do not smoke for at least 2 weeks before your procedure or for as long as told by your health care provider.  Let your health care provider know if you develop a cold or any infection before your surgery.  Plan to have someone take you home from the hospital or clinic.  If you will be going home right after the procedure, plan to have someone with you for 24 hours. What happens during the procedure?  To reduce your risk of infection: ? Your health care team will wash or sanitize their hands. ? Your skin will be washed with soap. ? Hair may be removed from the surgical area.  An IV tube will be inserted into one of your veins.  You will be given one or more of the following: ? A medicine to help you relax (sedative). ? A medicine to numb the area (local anesthetic). ? A medicine to make you fall asleep (general anesthetic).  Your surgeon will make an incision over the hernia.  The tissues of the hernia will be moved back into place.  The edges of the hernia may be stitched together.  The opening in the abdominal muscles will be closed with stitches (sutures). Or, your surgeon will place a mesh patch made of manmade (synthetic) material over the opening.  The incision will be closed.  A bandage (dressing) may be placed over the incision. The procedure may vary among health care providers and hospitals. What happens after the procedure?  Your blood pressure, heart rate, breathing rate, and blood oxygen level will be monitored until the medicines you were given have worn off.  You may be given medicine for pain.  Do not drive for 24 hours if you received a sedative. This information is not intended to replace advice given to you by your health care provider. Make  sure you discuss any questions you have with your health care provider. Document Released: 08/09/2000 Document Revised: 09/03/2015 Document Reviewed: 07/28/2015 Elsevier Interactive Patient Education  2018 [place], Adult, Care After These instructions give you information about caring for yourself after your procedure. Your doctor may also give you more specific instructions. If you have problems or questions, contact your doctor. Follow these instructions at home: Surgical cut (incision) care   Follow instructions from your doctor about how to take care of your surgical cut area. Make sure you: ? Wash your hands with soap and water before you change your bandage (dressing). If you cannot use soap and water, use hand sanitizer. ? Change your bandage as told by your doctor. ? Leave stitches (sutures), skin glue, or skin tape (adhesive) strips in place. They may need to stay in place for 2 weeks or longer. If tape strips get loose and curl up, you may trim the loose  edges. Do not remove tape strips completely unless your doctor says it is okay.  Check your surgical cut every day for signs of infection. Check for: ? More redness, swelling, or pain. ? More fluid or blood. ? Warmth. ? Pus or a bad smell. Activity  Do not drive or use heavy machinery while taking prescription pain medicine. Do not drive until your doctor says it is okay.  Until your doctor says it is okay: ? Do not lift anything that is heavier than 10 lb (4.5 kg). ? Do not play contact sports.  Return to your normal activities as told by your doctor. Ask your doctor what activities are safe. General instructions  To prevent or treat having a hard time pooping (constipation) while you are taking prescription pain medicine, your doctor may recommend that you: ? Drink enough fluid to keep your pee (urine) clear or pale yellow. ? Take over-the-counter or prescription medicines. ? Eat foods that are  high in fiber, such as fresh fruits and vegetables, whole grains, and beans. ? Limit foods that are high in fat and processed sugars, such as fried and sweet foods.  Take over-the-counter and prescription medicines only as told by your doctor.  Do not take baths, swim, or use a hot tub until your doctor says it is okay.  Keep all follow-up visits as told by your doctor. This is important. Contact a doctor if:  You develop a rash.  You have more redness, swelling, or pain around your surgical cut.  You have more fluid or blood coming from your surgical cut.  Your surgical cut feels warm to the touch.  You have pus or a bad smell coming from your surgical cut.  You have a fever or chills.  You have blood in your poop (stool).  You have not pooped in 2-3 days.  Medicine does not help your pain. Get help right away if:  You have chest pain or you are short of breath.  You feel light-headed.  You feel weak and dizzy (feel faint).  You have very bad pain.  You throw up (vomit) and your pain is worse. This information is not intended to replace advice given to you by your health care provider. Make sure you discuss any questions you have with your health care provider. Document Released: 03/06/2014 Document Revised: 09/03/2015 Document Reviewed: 07/28/2015 Elsevier Interactive Patient Education  2017 [place] Anesthesia, Adult General anesthesia is the use of medicines to make a person "go to sleep" (be unconscious) for a medical procedure. General anesthesia is often recommended when a procedure:  Is long.  Requires you to be still or in an unusual position.  Is major and can cause you to lose blood.  Is impossible to do without general anesthesia.  The medicines used for general anesthesia are called general anesthetics. In addition to making you sleep, the medicines:  Prevent pain.  Control your blood pressure.  Relax your muscles.  Tell a  health care provider about:  Any allergies you have.  All medicines you are taking, including vitamins, herbs, eye drops, creams, and over-the-counter medicines.  Any problems you or family members have had with anesthetic medicines.  Types of anesthetics you have had in the past.  Any bleeding disorders you have.  Any surgeries you have had.  Any medical conditions you have.  Any history of heart or lung conditions, such as heart failure, sleep apnea, or chronic obstructive pulmonary disease (COPD).  Whether you  are pregnant or may be pregnant.  Whether you use tobacco, alcohol, marijuana, or street drugs.  Any history of Armed forces logistics/support/administrative officer.  Any history of depression or anxiety. What are the risks? Generally, this is a safe procedure. However, problems may occur, including:  Allergic reaction to anesthetics.  Lung and heart problems.  Inhaling food or liquids from your stomach into your lungs (aspiration).  Injury to nerves.  Waking up during your procedure and being unable to move (rare).  Extreme agitation or a state of mental confusion (delirium) when you wake up from the anesthetic.  Air in the bloodstream, which can lead to stroke.  These problems are more likely to develop if you are having a major surgery or if you have an advanced medical condition. You can prevent some of these complications by answering all of your health care provider's questions thoroughly and by following all pre-procedure instructions. General anesthesia can cause side effects, including:  Nausea or vomiting  A sore throat from the breathing tube.  Feeling cold or shivery.  Feeling tired, washed out, or achy.  Sleepiness or drowsiness.  Confusion or agitation.  What happens before the procedure? Staying hydrated Follow instructions from your health care provider about hydration, which may include:  Up to 2 hours before the procedure - you may continue to drink clear liquids,  such as water, clear fruit juice, black coffee, and plain tea.  Eating and drinking restrictions Follow instructions from your health care provider about eating and drinking, which may include:  8 hours before the procedure - stop eating heavy meals or foods such as meat, fried foods, or fatty foods.  6 hours before the procedure - stop eating light meals or foods, such as toast or cereal.  6 hours before the procedure - stop drinking milk or drinks that contain milk.  2 hours before the procedure - stop drinking clear liquids.  Medicines  Ask your health care provider about: ? Changing or stopping your regular medicines. This is especially important if you are taking diabetes medicines or blood thinners. ? Taking medicines such as aspirin and ibuprofen. These medicines can thin your blood. Do not take these medicines before your procedure if your health care provider instructs you not to. ? Taking new dietary supplements or medicines. Do not take these during the week before your procedure unless your health care provider approves them.  If you are told to take a medicine or to continue taking a medicine on the day of the procedure, take the medicine with sips of water. General instructions   Ask if you will be going home the same day, the following day, or after a longer hospital stay. ? Plan to have someone take you home. ? Plan to have someone stay with you for the first 24 hours after you leave the hospital or clinic.  For 3-6 weeks before the procedure, try not to use any tobacco products, such as cigarettes, chewing tobacco, and e-cigarettes.  You may brush your teeth on the morning of the procedure, but make sure to spit out the toothpaste. What happens during the procedure?  You will be given anesthetics through a mask and through an IV tube in one of your veins.  You may receive medicine to help you relax (sedative).  As soon as you are asleep, a breathing tube may be  used to help you breathe.  An anesthesia specialist will stay with you throughout the procedure. He or she will help keep you  comfortable and safe by continuing to give you medicines and adjusting the amount of medicine that you get. He or she will also watch your blood pressure, pulse, and oxygen levels to make sure that the anesthetics do not cause any problems.  If a breathing tube was used to help you breathe, it will be removed before you wake up. The procedure may vary among health care providers and hospitals. What happens after the procedure?  You will wake up, often slowly, after the procedure is complete, usually in a recovery area.  Your blood pressure, heart rate, breathing rate, and blood oxygen level will be monitored until the medicines you were given have worn off.  You may be given medicine to help you calm down if you feel anxious or agitated.  If you will be going home the same day, your health care provider may check to make sure you can stand, drink, and urinate.  Your health care providers will treat your pain and side effects before you go home.  Do not drive for 24 hours if you received a sedative.  You may: ? Feel nauseous and vomit. ? Have a sore throat. ? Have mental slowness. ? Feel cold or shivery. ? Feel sleepy. ? Feel tired. ? Feel sore or achy, even in parts of your body where you did not have surgery. This information is not intended to replace advice given to you by your health care provider. Make sure you discuss any questions you have with your health care provider. Document Released: 05/23/2007 Document Revised: 07/27/2015 Document Reviewed: 01/28/2015 Elsevier Interactive Patient Education  2018 [place] Anesthesia, Adult, Care After These instructions provide you with information about caring for yourself after your procedure. Your health care provider may also give you more specific instructions. Your treatment has been planned  according to current medical practices, but problems sometimes occur. Call your health care provider if you have any problems or questions after your procedure. What can I expect after the procedure? After the procedure, it is common to have:  Vomiting.  A sore throat.  Mental slowness.  It is common to feel:  Nauseous.  Cold or shivery.  Sleepy.  Tired.  Sore or achy, even in parts of your body where you did not have surgery.  Follow these instructions at home: For at least 24 hours after the procedure:  Do not: ? Participate in activities where you could fall or become injured. ? Drive. ? Use heavy machinery. ? Drink alcohol. ? Take sleeping pills or medicines that cause drowsiness. ? Make important decisions or sign legal documents. ? Take care of children on your own.  Rest. Eating and drinking  If you vomit, drink water, juice, or soup when you can drink without vomiting.  Drink enough fluid to keep your urine clear or pale yellow.  Make sure you have little or no nausea before eating solid foods.  Follow the diet recommended by your health care provider. General instructions  Have a responsible adult stay with you until you are awake and alert.  Return to your normal activities as told by your health care provider. Ask your health care provider what activities are safe for you.  Take over-the-counter and prescription medicines only as told by your health care provider.  If you smoke, do not smoke without supervision.  Keep all follow-up visits as told by your health care provider. This is important. Contact a health care provider if:  You continue to have nausea  or vomiting at home, and medicines are not helpful.  You cannot drink fluids or start eating again.  You cannot urinate after 8-12 hours.  You develop a skin rash.  You have fever.  You have increasing redness at the site of your procedure. Get help right away if:  You have  difficulty breathing.  You have chest pain.  You have unexpected bleeding.  You feel that you are having a life-threatening or urgent problem. This information is not intended to replace advice given to you by your health care provider. Make sure you discuss any questions you have with your health care provider. Document Released: 05/22/2000 Document Revised: 07/19/2015 Document Reviewed: 01/28/2015 Elsevier Interactive Patient Education  [employer].

## 2017-01-24 ENCOUNTER — Other Ambulatory Visit: Payer: Self-pay

## 2017-01-24 ENCOUNTER — Encounter (HOSPITAL_COMMUNITY)
Admission: RE | Admit: 2017-01-24 | Discharge: 2017-01-24 | Disposition: A | Discharge status: Home / Self Care | Payer: Medicare Other | Source: Ambulatory Visit | Attending: General Surgery | Admitting: General Surgery

## 2017-01-24 ENCOUNTER — Encounter (HOSPITAL_COMMUNITY): Payer: Self-pay

## 2017-01-24 DIAGNOSIS — Z9889 Other specified postprocedural states: Secondary | ICD-10-CM | POA: Insufficient documentation

## 2017-01-24 DIAGNOSIS — K4090 Unilateral inguinal hernia, without obstruction or gangrene, not specified as recurrent: Secondary | ICD-10-CM | POA: Insufficient documentation

## 2017-01-24 DIAGNOSIS — Z818 Family history of other mental and behavioral disorders: Secondary | ICD-10-CM | POA: Diagnosis not present

## 2017-01-24 DIAGNOSIS — Z882 Allergy status to sulfonamides status: Secondary | ICD-10-CM | POA: Insufficient documentation

## 2017-01-24 DIAGNOSIS — F419 Anxiety disorder, unspecified: Secondary | ICD-10-CM | POA: Insufficient documentation

## 2017-01-24 DIAGNOSIS — M1990 Unspecified osteoarthritis, unspecified site: Secondary | ICD-10-CM | POA: Diagnosis not present

## 2017-01-24 DIAGNOSIS — Z01812 Encounter for preprocedural laboratory examination: Secondary | ICD-10-CM | POA: Diagnosis not present

## 2017-01-24 DIAGNOSIS — Z7982 Long term (current) use of aspirin: Secondary | ICD-10-CM | POA: Insufficient documentation

## 2017-01-24 DIAGNOSIS — Z79899 Other long term (current) drug therapy: Secondary | ICD-10-CM | POA: Diagnosis not present

## 2017-01-24 DIAGNOSIS — Z87891 Personal history of nicotine dependence: Secondary | ICD-10-CM | POA: Insufficient documentation

## 2017-01-24 DIAGNOSIS — F329 Major depressive disorder, single episode, unspecified: Secondary | ICD-10-CM | POA: Insufficient documentation

## 2017-01-24 HISTORY — DX: Personal history of urinary calculi: Z87.442

## 2017-01-24 LAB — CBC WITH DIFFERENTIAL/PLATELET
BASOS PCT: 1 %
Basophils Absolute: 0.1 10*3/uL (ref 0.0–0.1)
EOS ABS: 0.3 10*3/uL (ref 0.0–0.7)
Eosinophils Relative: 4 %
HCT: 45.1 % (ref 39.0–52.0)
HEMOGLOBIN: 14.5 g/dL (ref 13.0–17.0)
Lymphocytes Relative: 23 %
Lymphs Abs: 1.5 10*3/uL (ref 0.7–4.0)
MCH: 28.4 pg (ref 26.0–34.0)
MCHC: 32.2 g/dL (ref 30.0–36.0)
MCV: 88.4 fL (ref 78.0–100.0)
MONOS PCT: 10 %
Monocytes Absolute: 0.6 10*3/uL (ref 0.1–1.0)
NEUTROS PCT: 62 %
Neutro Abs: 4.1 10*3/uL (ref 1.7–7.7)
Platelets: 264 10*3/uL (ref 150–400)
RBC: 5.1 MIL/uL (ref 4.22–5.81)
RDW: 13.2 % (ref 11.5–15.5)
WBC: 6.6 10*3/uL (ref 4.0–10.5)

## 2017-01-24 LAB — BASIC METABOLIC PANEL
Anion gap: 8 (ref 5–15)
BUN: 16 mg/dL (ref 6–20)
CHLORIDE: 104 mmol/L (ref 101–111)
CO2: 24 mmol/L (ref 22–32)
CREATININE: 0.84 mg/dL (ref 0.61–1.24)
Calcium: 9.4 mg/dL (ref 8.9–10.3)
Glucose, Bld: 97 mg/dL (ref 65–99)
Potassium: 4.1 mmol/L (ref 3.5–5.1)
SODIUM: 136 mmol/L (ref 135–145)

## 2017-01-29 ENCOUNTER — Encounter (HOSPITAL_COMMUNITY)
Admission: RE | Disposition: A | Discharge status: Home / Self Care | Payer: Self-pay | Source: Ambulatory Visit | Attending: General Surgery

## 2017-01-29 ENCOUNTER — Ambulatory Visit (HOSPITAL_COMMUNITY)
Admission: RE | Admit: 2017-01-29 | Discharge: 2017-01-29 | Disposition: A | Discharge status: Home / Self Care | Payer: Medicare Other | Source: Ambulatory Visit | Attending: General Surgery | Admitting: General Surgery

## 2017-01-29 ENCOUNTER — Encounter (HOSPITAL_COMMUNITY): Payer: Self-pay | Admitting: *Deleted

## 2017-01-29 ENCOUNTER — Ambulatory Visit (HOSPITAL_COMMUNITY): Payer: Medicare Other | Admitting: Anesthesiology

## 2017-01-29 DIAGNOSIS — G4700 Insomnia, unspecified: Secondary | ICD-10-CM | POA: Insufficient documentation

## 2017-01-29 DIAGNOSIS — K4090 Unilateral inguinal hernia, without obstruction or gangrene, not specified as recurrent: Secondary | ICD-10-CM | POA: Diagnosis not present

## 2017-01-29 DIAGNOSIS — M1990 Unspecified osteoarthritis, unspecified site: Secondary | ICD-10-CM | POA: Diagnosis not present

## 2017-01-29 DIAGNOSIS — Z79899 Other long term (current) drug therapy: Secondary | ICD-10-CM | POA: Insufficient documentation

## 2017-01-29 DIAGNOSIS — F329 Major depressive disorder, single episode, unspecified: Secondary | ICD-10-CM | POA: Insufficient documentation

## 2017-01-29 DIAGNOSIS — Z7982 Long term (current) use of aspirin: Secondary | ICD-10-CM | POA: Insufficient documentation

## 2017-01-29 DIAGNOSIS — M791 Myalgia, unspecified site: Secondary | ICD-10-CM | POA: Diagnosis not present

## 2017-01-29 DIAGNOSIS — Z882 Allergy status to sulfonamides status: Secondary | ICD-10-CM | POA: Insufficient documentation

## 2017-01-29 DIAGNOSIS — E785 Hyperlipidemia, unspecified: Secondary | ICD-10-CM | POA: Diagnosis not present

## 2017-01-29 DIAGNOSIS — F419 Anxiety disorder, unspecified: Secondary | ICD-10-CM | POA: Diagnosis not present

## 2017-01-29 DIAGNOSIS — Z87891 Personal history of nicotine dependence: Secondary | ICD-10-CM | POA: Insufficient documentation

## 2017-01-29 HISTORY — PX: INGUINAL HERNIA REPAIR: SHX194

## 2017-01-29 SURGERY — REPAIR, HERNIA, INGUINAL, ADULT
Anesthesia: General | Site: Groin | Laterality: Right

## 2017-01-29 MED ORDER — MIDAZOLAM HCL 2 MG/2ML IJ SOLN
INTRAMUSCULAR | Status: AC
  Filled 2017-01-29: qty 2

## 2017-01-29 MED ORDER — PROPOFOL 10 MG/ML IV BOLUS
INTRAVENOUS | Status: AC
  Filled 2017-01-29: qty 20

## 2017-01-29 MED ORDER — LIDOCAINE HCL (PF) 1 % IJ SOLN
INTRAMUSCULAR | Status: AC
  Filled 2017-01-29: qty 5

## 2017-01-29 MED ORDER — ROCURONIUM BROMIDE 50 MG/5ML IV SOLN
INTRAVENOUS | Status: AC
  Filled 2017-01-29: qty 1

## 2017-01-29 MED ORDER — BUPIVACAINE LIPOSOME 1.3 % IJ SUSP
INTRAMUSCULAR | Status: AC
  Filled 2017-01-29: qty 20

## 2017-01-29 MED ORDER — ONDANSETRON 4 MG PO TBDP
ORAL_TABLET | ORAL | Status: AC
  Filled 2017-01-29: qty 1

## 2017-01-29 MED ORDER — MIDAZOLAM HCL 2 MG/2ML IJ SOLN
1.0000 mg | Freq: Once | INTRAMUSCULAR | Status: AC | PRN
  Administered 2017-01-29: 2 mg via INTRAVENOUS

## 2017-01-29 MED ORDER — OXYCODONE HCL 5 MG/5ML PO SOLN: 5.0000 mg | Freq: Once | ORAL | Status: DC | PRN

## 2017-01-29 MED ORDER — FENTANYL CITRATE (PF) 100 MCG/2ML IJ SOLN
INTRAMUSCULAR | Status: AC
  Filled 2017-01-29: qty 2

## 2017-01-29 MED ORDER — CEFAZOLIN SODIUM-DEXTROSE 2-4 GM/100ML-% IV SOLN
2.0000 g | INTRAVENOUS | Status: AC
  Administered 2017-01-29: 2 g via INTRAVENOUS
  Filled 2017-01-29: qty 100

## 2017-01-29 MED ORDER — FENTANYL CITRATE (PF) 100 MCG/2ML IJ SOLN: 25.0000 ug | INTRAMUSCULAR | Status: DC | PRN

## 2017-01-29 MED ORDER — LACTATED RINGERS IV SOLN
INTRAVENOUS | Status: DC
  Administered 2017-01-29: 07:00:00 via INTRAVENOUS

## 2017-01-29 MED ORDER — SODIUM CHLORIDE 0.9 % IR SOLN
Status: DC | PRN
  Administered 2017-01-29: 1000 mL

## 2017-01-29 MED ORDER — PROPOFOL 10 MG/ML IV BOLUS
INTRAVENOUS | Status: AC
  Filled 2017-01-29: qty 40

## 2017-01-29 MED ORDER — OXYCODONE HCL 5 MG PO TABS: 5.0000 mg | ORAL_TABLET | Freq: Once | ORAL | 0 refills | Status: DC | PRN

## 2017-01-29 MED ORDER — ONDANSETRON 4 MG PO TBDP
4.0000 mg | ORAL_TABLET | Freq: Once | ORAL | Status: AC
  Administered 2017-01-29: 4 mg via ORAL

## 2017-01-29 MED ORDER — LIDOCAINE HCL (CARDIAC) 10 MG/ML IV SOLN
INTRAVENOUS | Status: DC | PRN
Start: 2017-01-29 — End: 2017-01-29
  Administered 2017-01-29: 50 mg via INTRAVENOUS

## 2017-01-29 MED ORDER — FENTANYL CITRATE (PF) 100 MCG/2ML IJ SOLN
INTRAMUSCULAR | Status: DC | PRN
  Administered 2017-01-29: 50 ug via INTRAVENOUS
  Administered 2017-01-29 (×2): 25 ug via INTRAVENOUS

## 2017-01-29 MED ORDER — OXYCODONE HCL 5 MG PO TABS: 5.0000 mg | ORAL_TABLET | Freq: Once | ORAL | Status: DC | PRN

## 2017-01-29 MED ORDER — BUPIVACAINE LIPOSOME 1.3 % IJ SUSP
INTRAMUSCULAR | Status: DC | PRN
Start: 2017-01-29 — End: 2017-01-29
  Administered 2017-01-29: 20 mL

## 2017-01-29 MED ORDER — PROPOFOL 10 MG/ML IV BOLUS
INTRAVENOUS | Status: DC | PRN
  Administered 2017-01-29: 150 mg via INTRAVENOUS
  Administered 2017-01-29: 30 mg via INTRAVENOUS

## 2017-01-29 SURGICAL SUPPLY — 41 items
ADH SKN CLS APL DERMABOND .7 (GAUZE/BANDAGES/DRESSINGS) ×1
BAG HAMPER (MISCELLANEOUS) ×3 IMPLANT
CLOTH BEACON ORANGE TIMEOUT ST (SAFETY) ×3 IMPLANT
COVER LIGHT HANDLE STERIS (MISCELLANEOUS) ×6 IMPLANT
DERMABOND ADVANCED (GAUZE/BANDAGES/DRESSINGS) ×2
DERMABOND ADVANCED .7 DNX12 (GAUZE/BANDAGES/DRESSINGS) ×1 IMPLANT
DRAIN PENROSE 18X1/2 LTX STRL (DRAIN) ×3 IMPLANT
ELECT REM PT RETURN 9FT ADLT (ELECTROSURGICAL) ×3
ELECTRODE REM PT RTRN 9FT ADLT (ELECTROSURGICAL) ×1 IMPLANT
GLOVE BIO SURGEON STRL SZ 6.5 (GLOVE) ×2 IMPLANT
GLOVE BIO SURGEON STRL SZ7 (GLOVE) ×3 IMPLANT
GLOVE BIO SURGEONS STRL SZ 6.5 (GLOVE) ×1
GLOVE BIOGEL PI IND STRL 6.5 (GLOVE) ×2 IMPLANT
GLOVE BIOGEL PI IND STRL 7.0 (GLOVE) ×2 IMPLANT
GLOVE BIOGEL PI INDICATOR 6.5 (GLOVE) ×4
GLOVE BIOGEL PI INDICATOR 7.0 (GLOVE) ×4
GOWN STRL REUS W/ TWL XL LVL3 (GOWN DISPOSABLE) ×1 IMPLANT
GOWN STRL REUS W/TWL LRG LVL3 (GOWN DISPOSABLE) ×6 IMPLANT
GOWN STRL REUS W/TWL XL LVL3 (GOWN DISPOSABLE) ×3
INST SET MINOR GENERAL (KITS) ×3 IMPLANT
KIT ROOM TURNOVER APOR (KITS) ×3 IMPLANT
MANIFOLD NEPTUNE II (INSTRUMENTS) ×3 IMPLANT
MESH HERNIA 1.6X1.9 PLUG LRG (Mesh General) ×2 IMPLANT
MESH HERNIA PLUG LRG (Mesh General) ×4 IMPLANT
NEEDLE HYPO 21X1.5 SAFETY (NEEDLE) ×3 IMPLANT
NS IRRIG 1000ML POUR BTL (IV SOLUTION) ×3 IMPLANT
PACK MINOR (CUSTOM PROCEDURE TRAY) ×3 IMPLANT
PAD ARMBOARD 7.5X6 YLW CONV (MISCELLANEOUS) ×3 IMPLANT
SET BASIN LINEN APH (SET/KITS/TRAYS/PACK) ×3 IMPLANT
SUT MNCRL AB 4-0 PS2 18 (SUTURE) ×3 IMPLANT
SUT NOVA NAB GS-22 2 2-0 T-19 (SUTURE) ×9 IMPLANT
SUT PROLENE 2 0 SH 30 (SUTURE) IMPLANT
SUT SILK 3 0 (SUTURE)
SUT SILK 3-0 18XBRD TIE 12 (SUTURE) IMPLANT
SUT VIC AB 2-0 CT1 27 (SUTURE) ×3
SUT VIC AB 2-0 CT1 TAPERPNT 27 (SUTURE) ×1 IMPLANT
SUT VIC AB 3-0 SH 27 (SUTURE) ×3
SUT VIC AB 3-0 SH 27X BRD (SUTURE) ×1 IMPLANT
SUT VIC AB 4-0 PS2 27 (SUTURE) ×3 IMPLANT
SUT VICRYL AB 3 0 TIES (SUTURE) ×3 IMPLANT
SYR 20CC LL (SYRINGE) ×3 IMPLANT

## 2017-01-29 NOTE — Op Note (Signed)
[facility] Operative Note  01/29/17  Preoperative Diagnosis: Right inguinal hernia    Postoperative Diagnosis: Same   Procedure(s) Performed: Right inguinal hernia repair with mesh   Surgeon: Lanell Matar. Constance Haw, MD   Assistants: Aviva Signs, MD    Anesthesia: General endotracheal with LMA    Anesthesiologist: Dr. Carolanne Grumbling MD    Specimens: None   Estimated Blood Loss: Minimal   Blood Replacement: None    Complications: None   Wound Class: Clean    Operative Indications: Albert Gonzalez is a 66 yo who has had a right inguinal hernia for an extended amount of time that has continued to enlarge and cause him discomfort. He never had any incarceration of obstructive symptoms, but given the increasing discomfort would like to get it repaired.  After a discussion of the risk and benefits including, bleeding, infection, use of mesh, risk of recurrence, risk of nerve damage causing numbness or changes in sensation, risk of damage to the cord structures, the patient expressed understanding and opted to proceed.  Findings: Large indirect right inguinal hernia requiring 2 large Perfix plugs to be placed, laxity of the floor but no direct hernia    Procedure: The patient was taken to the operating room and placed supine. General endotracheal anesthesia was induced. Intravenous antibiotics were administered per protocol.  A time out was preformed verifying the correct patient, procedure, site, positioning and implants.  The right groin and scrotum were prepared and draped in the usual sterile fashion.   An incision was made in a natural skin crease between the pubic tubercle and the anterior superior iliac spine.  The incision was deepened with electrocautery through Scarpa's and Camper's fascia until the aponeurosis of the external oblique was encountered.  This was cleaned and the external ring was exposed.  An incision was made in the midportion of the external oblique  aponeurosis in the direction of its fibers. The ilioinguinal nerve was not identified in the field. Flaps of the external oblique were developed cephalad and inferiorly.    The cord was identified and it was gently dissented free at the pubic tubercle and encircled with a Penrose drain.  Attention was then directed at the anteromedial aspect of the cord, where a large indirect hernia sac was identified.  The sac was carefully dissected free from the cord down to the level of the internal ring.  The vas and testicular vessels were identified and protected from harm.  Once the sac was dissected free from the cords, the Penrose was placed around the cord which was retracted inferiorly out of the field of view.  The hernia was reduced into the internal ring without difficulty.  Due to the size of the hernia defect, two large Perfix Plugs were placed side by side into the defect and filled the space.  Attention was then turned to the floor of the canal, which was grossly weakened without any defined defect or sac.  The Perfix Mesh Patch was sutured to the inguinal ligament inferiorly starting at the pubic tubercle using 2-0 Novafil interrupted sutures.  The mesh was sutured superiorly to the conjoint tendon using 2-0 Novafil interrupted sutures.  Care was taken to ensure the mesh was placed in a relaxed fashion to avoid excessive tension and no neurovascular structures were caught in the repair.  Laterally the tails of the mesh were crossed and the internal ring was recreated, allowing for passage of cords without tension.   Hemostasis was adequate.  The Penrose was  removed.  The external oblique aponeurosis was closed with a 2-0 Vicryl suture in a running fashion, taking care to not catch the ilioinguinal nerve in the suture line.  Scarpa's fashion was closed with 3-0 Vicryl interrupted sutures. The skin was closed with a subcuticular 4-0 Monocryl suture.  Dermabond was applied.   The testis was gently pulled down  into its anatomic position in the scrotum.  The patient tolerated the procedure well and was taken to the PACU in stable condition. All counts were correct at the end of the case.       Curlene Labrum, MD [facility] [address] [place], [place] [address] [phone] (office)

## 2017-01-29 NOTE — Anesthesia Postprocedure Evaluation (Signed)
Anesthesia Post Note  Patient: Albert Gonzalez  Procedure(s) Performed: HERNIA REPAIR INGUINAL ADULT WITH MESH (Right Groin)  Anesthesia Type: General Level of consciousness: awake and alert Pain management: satisfactory to patient Vital Signs Assessment: post-procedure vital signs reviewed and stable Respiratory status: spontaneous breathing Cardiovascular status: stable Postop Assessment: no apparent nausea or vomiting     Last Vitals:  Vitals:   01/29/17 0924 01/29/17 0936  BP: (!) 116/92 131/83  Pulse: 62 61  Resp: 17 16  Temp:  36.5 C  SpO2: 100% 93%    Last Pain:  Vitals:   01/29/17 0936  TempSrc: Oral                 Chenee Munns

## 2017-01-29 NOTE — Transfer of Care (Signed)
Immediate Anesthesia Transfer of Care Note  Patient: Albert Gonzalez  Procedure(s) Performed: HERNIA REPAIR INGUINAL ADULT WITH MESH (Right Groin)  Patient Location: PACU  Anesthesia Type:General  Level of Consciousness: awake and patient cooperative  Airway & Oxygen Therapy: Patient Spontanous Breathing and non-rebreather face mask  Post-op Assessment: Report given to RN and Post -op Vital signs reviewed and stable  Post vital signs: Reviewed and stable  Last Vitals:  Vitals:   01/29/17 0655 01/29/17 0700  BP:  116/75  Pulse:    Resp: 19 18  Temp:    SpO2:  94%    Last Pain:  Vitals:   01/29/17 0627  TempSrc: Oral      Patients Stated Pain Goal: 7 (57/89/[id number])  Complications: No apparent anesthesia complications

## 2017-01-29 NOTE — Interval H&P Note (Signed)
History and Physical Interval Note:  01/29/2017 7:08 AM  Albert Gonzalez  has presented today for surgery, with the diagnosis of right inguinal hernia  The various methods of treatment have been discussed with the patient and family. After consideration of risks, benefits and other options for treatment, the patient has consented to  Procedure(s): HERNIA REPAIR INGUINAL ADULT WITH MESH (Right) as a surgical intervention .  The patient's history has been reviewed, patient examined, no change in status, stable for surgery.  I have reviewed the patient's chart and labs.  Questions were answered to the patient's satisfaction.    No changes. No questions.  Virl Cagey

## 2017-01-29 NOTE — Anesthesia Preprocedure Evaluation (Signed)
Anesthesia Evaluation  Patient identified by MRN, date of birth, ID band  Airway Mallampati: I       Dental no notable dental hx.    Pulmonary former smoker,    Pulmonary exam normal        Cardiovascular  Rhythm:Regular Rate:Normal     Neuro/Psych PSYCHIATRIC DISORDERS Anxiety Depression    GI/Hepatic negative GI ROS, Neg liver ROS,   Endo/Other  negative endocrine ROS  Renal/GU negative Renal ROS     Musculoskeletal  (+) Arthritis ,   Abdominal Normal abdominal exam  (+)  Abdomen: soft.    Peds  Hematology negative hematology ROS (+)   Anesthesia Other Findings   Reproductive/Obstetrics                             Anesthesia Physical Anesthesia Plan  ASA: II  Anesthesia Plan: General   Post-op Pain Management:    Induction: Intravenous  PONV Risk Score and Plan:   Airway Management Planned: LMA  Additional Equipment:   Intra-op Plan:   Post-operative Plan: Extubation in OR  Informed Consent: I have reviewed the patients History and Physical, chart, labs and discussed the procedure including the risks, benefits and alternatives for the proposed anesthesia with the patient or authorized representative who has indicated his/her understanding and acceptance.   Dental advisory given  Plan Discussed with: CRNA and Anesthesiologist  Anesthesia Plan Comments:         Anesthesia Quick Evaluation

## 2017-01-29 NOTE — Discharge Instructions (Signed)
Discharge Instructions: Shower per your regular routine. Take tylenol and ibuprofen as needed for pain control, alternating every 4-6 hours.  Take Roxicodone for breakthrough pain. Take colace for constipation related to narcotic pain medication. Do not pick at the dermabond glue on your incision sites.  Do not take your VICOPROFEN at the same time or within 4 hours of taking the Roxicodone because they are both narcotic medications.   PATIENT INSTRUCTIONS HERNIA  FOLLOW-UP:  Please make an appointment with your physician in 2 week(s).  Call your physician immediately if you have any fevers greater than 102.5, drainage from you wound that is not clear or looks infected, persistent bleeding, increasing abdominal pain, problems urinating, or persistent nausea/vomiting.    WOUND CARE INSTRUCTIONS:  Keep a dry clean dressing on the wound if there is drainage. The initial bandage may be removed after 24 hours.  Once the wound has quit draining you may leave it open to air.  If clothing rubs against the wound or causes irritation and the wound is not draining you may cover it with a dry dressing during the daytime.  Try to keep the wound dry and avoid ointments on the wound unless directed to do so.  If the wound becomes bright red and painful or starts to drain infected material that is not clear, please contact your physician immediately.  If the wound is mildly pink and has a thick firm ridge underneath it, this is normal, and is referred to as a healing ridge.  This will resolve over the next 4-6 weeks.  DIET:  You may eat any foods that you can tolerate.  It is a good idea to eat a high fiber diet and take in plenty of fluids to prevent constipation.  If you do become constipated you may want to take a mild laxative or take ducolax tablets on a daily basis until your bowel habits are regular.  Constipation can be very uncomfortable, along with straining, after recent abdominal surgery.  ACTIVITY:   You are encouraged to cough and deep breath or use your incentive spirometer if you were given one, every 15-30 minutes when awake.  This will help prevent respiratory complications and low grade fevers post-operatively.  You may want to hug a pillow when coughing and sneezing to add additional support to the surgical area which will decrease pain during these times.  You are encouraged to walk and engage in light activity for the next two weeks.  You should not lift more than 10 pounds during this time frame as it could put you at increased risk for a hernia recurrence.    MEDICATIONS:  Try to take narcotic medications and anti-inflammatory medications, such as tylenol, ibuprofen, naprosyn, etc., with food.  This will minimize stomach upset from the medication.  Should you develop nausea and vomiting from the pain medication, or develop a rash, please discontinue the medication and contact your physician.  You should not drive, make important decisions, or operate machinery when taking narcotic pain medication.  QUESTIONS:  Please feel free to call your physician or the hospital operator if you have any questions, and they will be glad to assist you.

## 2017-01-29 NOTE — Anesthesia Procedure Notes (Signed)
Procedure Name: LMA Insertion Date/Time: 01/29/2017 7:35 AM Performed by: Vista Deck, CRNA Pre-anesthesia Checklist: Patient identified, Patient being monitored, Emergency Drugs available, Timeout performed and Suction available Patient Re-evaluated:Patient Re-evaluated prior to induction Oxygen Delivery Method: Circle System Utilized Preoxygenation: Pre-oxygenation with 100% oxygen Induction Type: IV induction Ventilation: Mask ventilation without difficulty LMA: LMA inserted LMA Size: 3.0 Number of attempts: 2 Placement Confirmation: positive ETCO2 and breath sounds checked- equal and bilateral Comments: Unsuccessful 4 LMA insertion. Easy 3 LMA insertion.

## 2017-01-30 ENCOUNTER — Encounter (HOSPITAL_COMMUNITY): Payer: Self-pay | Admitting: General Surgery

## 2017-02-01 ENCOUNTER — Telehealth (HOSPITAL_COMMUNITY): Payer: Self-pay | Admitting: Psychiatry

## 2017-02-01 NOTE — Telephone Encounter (Signed)
noted 

## 2017-02-08 ENCOUNTER — Encounter: Payer: Self-pay | Admitting: General Surgery

## 2017-02-08 ENCOUNTER — Ambulatory Visit (INDEPENDENT_AMBULATORY_CARE_PROVIDER_SITE_OTHER): Payer: Self-pay | Admitting: General Surgery

## 2017-02-08 VITALS — BP 129/87 | HR 97 | Temp 97.8°F | Resp 18 | Ht 71.0 in | Wt 173.0 lb

## 2017-02-08 DIAGNOSIS — K4090 Unilateral inguinal hernia, without obstruction or gangrene, not specified as recurrent: Secondary | ICD-10-CM

## 2017-02-08 NOTE — Progress Notes (Signed)
[facility] Note   HPI:  66 y.o. Male presents to clinic for his post-op follow-up evaluation after a right inguinal hernia repair with mesh. He reports he is doing well and having no pain. He is moving around and not using the pain medication. He is eating and drinking and having BMs. Overall feeling good.   Review of Systems:  Induration at the site of the incision No redness or drainage No fevers or chills All other review of systems: otherwise negative   Vital Signs:  BP 129/87   Pulse 97   Temp 97.8 F (36.6 C)   Resp 18   Ht 5\' 11"  (1.803 m)   Wt 173 lb (78.5 kg)   BMI 24.13 kg/m    Physical Exam:  Physical Exam  Constitutional: He is well-developed, well-nourished, and in no distress.  Pulmonary/Chest: Effort normal.  Neurological: He is alert.  Vitals reviewed.   Laboratory studies: None   Imaging:  None   Assessment:  66 y.o. yo Male s/p R inguinal hernia repair with mesh. Doing well and feeling well.  Plan:  - Doing well and having some induration at the site but overall well, will improve with time   - Follow up PRN  - Call with issues   Curlene Labrum, MD [facility] [address] [place], [place] [address] [phone] (office)

## 2017-03-07 NOTE — Progress Notes (Deleted)
[facility] MD/PA/NP OP Progress Note  03/07/2017 2:44 PM Albert Gonzalez  MRN:  [id number]  Chief Complaint:  HPI: *** Visit Diagnosis: No diagnosis found.  Past Psychiatric History:  I have reviewed the patient's psychiatry history in detail and updated the patient record. Outpatient: denies Psychiatry admission: denies Previous suicide attempt:  Past trials of medication: Fluoxetine, duloxetine, pristiq, quetiapine (nightmares), Ambien, Xanax,  History of violence: denies    Past Medical History:  Past Medical History:  Diagnosis Date  . Anxiety   . Arthritis   . Depression   . History of kidney stones   . Hyperlipidemia   . Inguinal hernia right   . Insomnia   . Myalgia     Past Surgical History:  Procedure Laterality Date  . COLONOSCOPY  02/08/2011   Procedure: COLONOSCOPY;  Surgeon: Daneil Dolin, MD;  Location: [facility];  Service: Endoscopy;  Laterality: N/A;  1:30 PM  . INGUINAL HERNIA REPAIR Right 01/29/2017   Procedure: HERNIA REPAIR INGUINAL ADULT WITH MESH;  Surgeon: Virl Cagey, MD;  Location: [facility];  Service: General;  Laterality: Right;  . KNEE ARTHROSCOPY WITH MEDIAL MENISECTOMY Right 04/05/2016   Procedure: RIGHT KNEE ARTHROSCOPY WITH PARTIAL  LATERAL MENISECTOMY, DEBRIDEMENT MEDIAL MENISCUS, ACL DEBRIDEMENT, MICRO FRACTURE OF FEMUR;  Surgeon: Carole Civil, MD;  Location: [facility];  Service: Orthopedics;  Laterality: Right;  . KNEE SURGERY     left  . KYPHOPLASTY    . TONSILLECTOMY      Family Psychiatric History: I have reviewed the patient's family history in detail and updated the patient record.  Family History:  Family History  Problem Relation Age of Onset  . Suicidality Father   . Colon cancer Neg Hx   . Liver disease Neg Hx   . Inflammatory bowel disease Neg Hx   . Anesthesia problems Neg Hx     Social History:  Social History   Socioeconomic History  . Marital status: Married    Spouse name: Not on file  . Number of  children: 1  . Years of education: Not on file  . Highest education level: Not on file  Social Needs  . Financial resource strain: Not on file  . Food insecurity - worry: Not on file  . Food insecurity - inability: Not on file  . Transportation needs - medical: Not on file  . Transportation needs - non-medical: Not on file  Occupational History  . Occupation: respiratory therapist    Employer: [employer]  Tobacco Use  . Smoking status: Former Smoker    Packs/day: 0.50    Years: 20.00    Pack years: 10.00    Types: Pipe    Last attempt to quit: 04/03/1990    Years since quitting: 26.9  . Smokeless tobacco: Never Used  . Tobacco comment: quit many years ago  Substance and Sexual Activity  . Alcohol use: Yes    Comment: occassional  . Drug use: No  . Sexual activity: Yes  Other Topics Concern  . Not on file  Social History Narrative  . Not on file    Allergies:  Allergies  Allergen Reactions  . Sulfa Antibiotics Rash    Childhood     Metabolic Disorder Labs: No results found for: HGBA1C, MPG No results found for: PROLACTIN No results found for: CHOL, TRIG, HDL, CHOLHDL, VLDL, LDLCALC No results found for: TSH  Therapeutic Level Labs: No results found for: LITHIUM No results found for: VALPROATE  No components found for:  CBMZ  Current Medications: Current Outpatient Medications  Medication Sig Dispense Refill  . ALPRAZolam (XANAX) 0.5 MG tablet Take 0.5 mg daily as needed by mouth for anxiety.     Marland Kitchen aspirin 325 MG tablet Take 325 mg by mouth daily.      Marland Kitchen atorvastatin (LIPITOR) 40 MG tablet Take 40 mg daily at 6 PM by mouth.     . Calcium Citrate-Vitamin D (CALCIUM + D PO) Take 2 tablets by mouth daily.    . diclofenac (CATAFLAM) 50 MG tablet Take 1 tablet (50 mg total) by mouth 2 (two) times daily. 180 tablet 5  . DULoxetine (CYMBALTA) 30 MG capsule Total of 90 mg daily (60 mg + 30 mg) 90 capsule 0  . DULoxetine (CYMBALTA) 60 MG capsule Total  of 90 mg daily (60 mg + 30 mg) (Patient taking differently: Take 60 mg at bedtime by mouth. Total of 90 mg daily (60 mg + 30 mg)) 90 capsule 0  . HYDROcodone-ibuprofen (VICOPROFEN) 7.5-200 MG tablet Take 1 tablet daily as needed by mouth for moderate pain.    Marland Kitchen ibuprofen (ADVIL,MOTRIN) 200 MG tablet Take 400 mg 2 (two) times daily as needed by mouth for mild pain (takes at least once every day). For pain     . Misc Natural Products (GLUCOSAMINE CHOND DOUBLE STR PO) Take 2 tablets by mouth daily.    . Multiple Vitamin (MULTIVITAMIN) tablet Take 1 tablet by mouth daily. Men's 50+    . Multiple Vitamins-Minerals (EQ VISION FORMULA 50+ PO) Take 1 tablet by mouth daily.    Marland Kitchen oxyCODONE (OXY IR/ROXICODONE) 5 MG immediate release tablet Take 1 tablet (5 mg total) by mouth once as needed for severe pain or breakthrough pain. 15 tablet 0  . tiZANidine (ZANAFLEX) 4 MG tablet Take 4 mg daily as needed by mouth for muscle spasms.     . traZODone (DESYREL) 50 MG tablet 25-50 mg at night as needed for sleep (Patient taking differently: Take 50 mg at bedtime by mouth. ) 90 tablet 0   No current facility-administered medications for this visit.      Musculoskeletal: Strength & Muscle Tone: within normal limits Gait & Station: normal Patient leans: N/A  Psychiatric Specialty Exam: ROS  There were no vitals taken for this visit.There is no height or weight on file to calculate BMI.  General Appearance: Fairly Groomed  Eye Contact:  Good  Speech:  Clear and Coherent  Volume:  Normal  Mood:  {BHH MOOD:22306}  Affect:  {Affect (PAA):22687}  Thought Process:  Coherent and Goal Directed  Orientation:  Full (Time, Place, and Person)  Thought Content: Logical   Suicidal Thoughts:  {ST/HT (PAA):22692}  Homicidal Thoughts:  {ST/HT (PAA):22692}  Memory:  Immediate;   Good Recent;   Good Remote;   Good  Judgement:  {Judgement (PAA):22694}  Insight:  {Insight (PAA):22695}  Psychomotor Activity:  Normal   Concentration:  Concentration: Good and Attention Span: Good  Recall:  Good  Fund of Knowledge: Good  Language: Good  Akathisia:  No  Handed:  Right  AIMS (if indicated): not done  Assets:  Communication Skills Desire for Improvement  ADL's:  Intact  Cognition: WNL  Sleep:  {BHH GOOD/FAIR/POOR:22877}   Screenings:   Assessment and Plan:  Albert Gonzalez is a 67 y.o. year old male with a history of depression, dyslipidemia, who presents for follow up appointment for No diagnosis found.  # MDD, moderate, recurrent without psychotic features #  r/o PTSD  There has been significant improvement in neurovegetative symptoms since uptitration of duloxetine. Will continue current dose to target depression and pain. He will be back to Xanax given he recently had three months refill; he is advised to take only as needed. Will continue trazodone prn for insomnia. Discussed behavioral activation.   Plan 1. Continue duloxetine 90 mg daily  2. He received a refill of Xanax by PCP- he will be back on this medication this time. He will be back to lorazepam 0.5 mg twice a day as needed at the next visit.  3. Continue Trazodone 25-50 mg at night as needed for sleep 4. Return to clinic in three months   The patient demonstrates the following risk factors for suicide: Chronic risk factors for suicide include: psychiatric disorder of depressionand chronic pain. Acute risk factorsfor suicide include: unemployment and social withdrawal/isolation. Protective factorsfor this patient include: responsibility to others (children, family), coping skills and hope for the future. Considering these factors, the overall suicide risk at this point appears to be low. Patient isappropriate for outpatient follow up.    Norman Clay, MD 03/07/2017, 2:44 PM

## 2017-03-13 ENCOUNTER — Ambulatory Visit (HOSPITAL_COMMUNITY): Payer: Self-pay | Admitting: Psychiatry

## 2017-03-22 DIAGNOSIS — K4090 Unilateral inguinal hernia, without obstruction or gangrene, not specified as recurrent: Secondary | ICD-10-CM | POA: Diagnosis not present

## 2017-03-22 DIAGNOSIS — F430 Acute stress reaction: Secondary | ICD-10-CM | POA: Diagnosis not present

## 2017-03-22 DIAGNOSIS — F419 Anxiety disorder, unspecified: Secondary | ICD-10-CM | POA: Diagnosis not present

## 2017-03-22 DIAGNOSIS — E785 Hyperlipidemia, unspecified: Secondary | ICD-10-CM | POA: Diagnosis not present

## 2017-03-23 ENCOUNTER — Other Ambulatory Visit (HOSPITAL_COMMUNITY)
Admission: RE | Admit: 2017-03-23 | Discharge: 2017-03-23 | Disposition: A | Discharge status: Home / Self Care | Payer: Medicare Other | Source: Ambulatory Visit | Attending: Pulmonary Disease | Admitting: Pulmonary Disease

## 2017-03-23 DIAGNOSIS — Z79891 Long term (current) use of opiate analgesic: Secondary | ICD-10-CM | POA: Diagnosis not present

## 2017-03-23 LAB — RAPID URINE DRUG SCREEN, HOSP PERFORMED
Amphetamines: NOT DETECTED
BARBITURATES: NOT DETECTED
Benzodiazepines: POSITIVE — AB
Cocaine: NOT DETECTED
Opiates: NOT DETECTED
Tetrahydrocannabinol: NOT DETECTED

## 2017-04-10 NOTE — Progress Notes (Signed)
[facility] MD/PA/NP OP Progress Note  04/11/2017 3:16 PM Albert Gonzalez  MRN:  [id number]  Chief Complaint:  Chief Complaint    Follow-up; Depression     HPI:  Patient presents for follow-up appointment for depression.  He apologized to miss the appointment as he was at the hospital with his wife.  His wife is doing better now.  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  He goes to church regularly.  He learned that he cannot fix something, although he is "Mr. 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  Although he feels frustrated with his stepson who does not do house chores, he tries to work on relationship with his wife rather than arguing over him.  He denies insomnia.  He denies feeling depressed.  He has fair appetite.  He has fair concentration.  He denies SI.  He feels anxious and tense at times and takes Xanax 0.5 mg 1 tab in 3 days.  He denies panic attacks.   Per PMP, On hydrocodone.  Xanax filled on 11/07/2016 for 90 days  Wt Readings from Last 3 Encounters:  04/11/17 180 lb (81.6 kg)  02/08/17 173 lb (78.5 kg)  01/24/17 175 lb (79.4 kg)     Visit Diagnosis:    ICD-10-CM   1. MDD (major depressive disorder), recurrent episode, moderate ([place]) F33.1     Past Psychiatric History:  I have reviewed the patient's psychiatry history in detail and updated the patient record. Outpatient: denies Psychiatry admission: denies Previous suicide attempt:  Past trials of medication: Fluoxetine, duloxetine, pristiq, quetiapine (nightmares), Ambien, Xanax,  History of violence: denies    Past Medical History:  Past Medical History:  Diagnosis Date  . Anxiety   . Arthritis   . Depression   . History of kidney stones   . Hyperlipidemia   . Inguinal hernia right   . Insomnia   . Myalgia     Past  Surgical History:  Procedure Laterality Date  . COLONOSCOPY  02/08/2011   Procedure: COLONOSCOPY;  Surgeon: Daneil Dolin, MD;  [facility];  Service: Endoscopy;  Laterality: N/A;  1:30 PM  . INGUINAL HERNIA REPAIR Right 01/29/2017   Procedure: HERNIA REPAIR INGUINAL ADULT WITH MESH;  Surgeon: Virl Cagey, MD;  Location: [facility];  Service: General;  Laterality: Right;  . KNEE ARTHROSCOPY WITH MEDIAL MENISECTOMY Right 04/05/2016   Procedure: RIGHT KNEE ARTHROSCOPY WITH PARTIAL  LATERAL MENISECTOMY, DEBRIDEMENT MEDIAL MENISCUS, ACL DEBRIDEMENT, MICRO FRACTURE OF FEMUR;  Surgeon: Carole Civil, MD;  Location: [facility];  Service: Orthopedics;  Laterality: Right;  . KNEE SURGERY     left  . KYPHOPLASTY    . TONSILLECTOMY      Family Psychiatric History:  I have reviewed the patient's family history in detail and updated the patient record.  Family History:  Family History  Problem Relation Age of Onset  . Suicidality Father   . Colon cancer Neg Hx   . Liver disease Neg Hx   . Inflammatory bowel disease Neg Hx   . Anesthesia problems Neg Hx     Social History:  Social History   Socioeconomic History  . Marital status: Married    Spouse name: None  . Number of children: 1  . Years of education: None  .  Highest education level: None  Social Needs  . Financial resource strain: None  . Food insecurity - worry: None  . Food insecurity - inability: None  . Transportation needs - medical: None  . Transportation needs - non-medical: None  Occupational History  . Occupation: respiratory therapist    Employer: [employer]  Tobacco Use  . Smoking status: Former Smoker    Packs/day: 0.50    Years: 20.00    Pack years: 10.00    Types: Pipe    Last attempt to quit: 04/03/1990    Years since quitting: 27.0  . Smokeless tobacco: Never Used  . Tobacco comment: quit many years ago  Substance and Sexual Activity  . Alcohol use: Yes    Comment:  occassional  . Drug use: No  . Sexual activity: Yes  Other Topics Concern  . None  Social History Narrative  . None    Allergies:  Allergies  Allergen Reactions  . Sulfa Antibiotics Rash    Childhood     Metabolic Disorder Labs: No results found for: HGBA1C, MPG No results found for: PROLACTIN No results found for: CHOL, TRIG, HDL, CHOLHDL, VLDL, LDLCALC No results found for: TSH  Therapeutic Level Labs: No results found for: LITHIUM No results found for: VALPROATE No components found for:  CBMZ  Current Medications: Current Outpatient Medications  Medication Sig Dispense Refill  . ALPRAZolam (XANAX) 0.5 MG tablet Take 0.5 mg daily as needed by mouth for anxiety.     Marland Kitchen aspirin 325 MG tablet Take 325 mg by mouth daily.      Marland Kitchen atorvastatin (LIPITOR) 40 MG tablet Take 40 mg daily at 6 PM by mouth.     . Calcium Citrate-Vitamin D (CALCIUM + D PO) Take 2 tablets by mouth daily.    . diclofenac (CATAFLAM) 50 MG tablet Take 1 tablet (50 mg total) by mouth 2 (two) times daily. 180 tablet 5  . DULoxetine (CYMBALTA) 30 MG capsule Total of 90 mg daily (60 mg + 30 mg) 90 capsule 0  . DULoxetine (CYMBALTA) 60 MG capsule Total of 90 mg daily (60 mg + 30 mg) (Patient taking differently: Take 60 mg at bedtime by mouth. Total of 90 mg daily (60 mg + 30 mg)) 90 capsule 0  . HYDROcodone-ibuprofen (VICOPROFEN) 7.5-200 MG tablet Take 1 tablet daily as needed by mouth for moderate pain.    Marland Kitchen ibuprofen (ADVIL,MOTRIN) 200 MG tablet Take 400 mg 2 (two) times daily as needed by mouth for mild pain (takes at least once every day). For pain     . Misc Natural Products (GLUCOSAMINE CHOND DOUBLE STR PO) Take 2 tablets by mouth daily.    . Multiple Vitamin (MULTIVITAMIN) tablet Take 1 tablet by mouth daily. Men's 50+    . Multiple Vitamins-Minerals (EQ VISION FORMULA 50+ PO) Take 1 tablet by mouth daily.    Marland Kitchen oxyCODONE (OXY IR/ROXICODONE) 5 MG immediate release tablet Take 1 tablet (5 mg total) by mouth  once as needed for severe pain or breakthrough pain. 15 tablet 0  . tiZANidine (ZANAFLEX) 4 MG tablet Take 4 mg daily as needed by mouth for muscle spasms.     . traZODone (DESYREL) 50 MG tablet 25-50 mg at night as needed for sleep (Patient taking differently: Take 50 mg at bedtime by mouth. ) 90 tablet 0   No current facility-administered medications for this visit.      Musculoskeletal: Strength & Muscle Tone: within normal limits Gait &  Station: normal Patient leans: N/A  Psychiatric Specialty Exam: Review of Systems  Psychiatric/Behavioral: Negative for depression, hallucinations, memory loss, substance abuse and suicidal ideas. The patient is nervous/anxious. The patient does not have insomnia.   All other systems reviewed and are negative.   Blood pressure 138/90, pulse 89, height 5\' 11"  (1.803 m), weight 180 lb (81.6 kg), SpO2 96 %.Body mass index is 25.1 kg/m.  General Appearance: Fairly Groomed  Eye Contact:  Good  Speech:  Clear and Coherent  Volume:  Normal  Mood:  "good"  Affect:  Appropriate, Congruent and calmer  Thought Process:  Coherent and Goal Directed  Orientation:  Full (Time, Place, and Person)  Thought Content: Logical   Suicidal Thoughts:  No  Homicidal Thoughts:  No  Memory:  Immediate;   Good Recent;   Good Remote;   Good  Judgement:  Good  Insight:  Good  Psychomotor Activity:  Normal  Concentration:  Concentration: Good and Attention Span: Good  Recall:  Good  Fund of Knowledge: Good  Language: Good  Akathisia:  No  Handed:  Right  AIMS (if indicated): not done  Assets:  Communication Skills Desire for Improvement  ADL's:  Intact  Cognition: WNL  Sleep:  Good   Screenings:   Assessment and Plan:  TANNEN VANDEZANDE is a 67 y.o. year old male with a history of depression, dyslipidemia, who presents for follow up appointment for MDD (major depressive disorder), recurrent episode, moderate ([place])  # MDD, moderate, recurrent without  psychotic features # r/o PTSD Patient reports overall improvement in neurovegetative symptoms and anxiety. Discussed the importance to restart his medication to avoid relapse, especially given he has episodes of anxiety and he still takes Xanax..  Will continue duloxetine to target depression.  (He has been on current dose since July 26th 2018).  Noted that patient occasionally takes Xanax, prescribed by his PCP.  He is advised to limit his use.  Will discontinue trazodone given he has not taken any recently.  Discussed self compassion.  Discussed behavioral activation.  Explored his value of connection with his wife and value congruent action he can take.  Plan 1. Continue duloxetine 90 mg daily (60 mg + 30 mg) 2. He has been on Xanax. Prescribed by PCP 3. Discontinue Trazodone 4. Return to clinic in three months for 15 mins  The patient demonstrates the following risk factors for suicide: Chronic risk factors for suicide include: psychiatric disorder of depressionand chronic pain. Acute risk factorsfor suicide include: unemployment and social withdrawal/isolation. Protective factorsfor this patient include: responsibility to others (children, family), coping skills and hope for the future. Considering these factors, the overall suicide risk at this point appears to be low. Patient isappropriate for outpatient follow up.  The duration of this appointment visit was 30 minutes of face-to-face time with the patient.  Greater than 50% of this time was spent in counseling, explanation of  diagnosis, planning of further management, and coordination of care.  Norman Clay, MD 04/11/2017, 3:15 PM

## 2017-04-11 ENCOUNTER — Ambulatory Visit (INDEPENDENT_AMBULATORY_CARE_PROVIDER_SITE_OTHER): Payer: Medicare Other | Admitting: Psychiatry

## 2017-04-11 ENCOUNTER — Encounter (HOSPITAL_COMMUNITY): Payer: Self-pay | Admitting: Psychiatry

## 2017-04-11 VITALS — BP 138/90 | HR 89 | Ht 71.0 in | Wt 180.0 lb

## 2017-04-11 DIAGNOSIS — R450 Nervousness: Secondary | ICD-10-CM | POA: Diagnosis not present

## 2017-04-11 DIAGNOSIS — F331 Major depressive disorder, recurrent, moderate: Secondary | ICD-10-CM | POA: Diagnosis not present

## 2017-04-11 DIAGNOSIS — Z87891 Personal history of nicotine dependence: Secondary | ICD-10-CM | POA: Diagnosis not present

## 2017-04-11 DIAGNOSIS — F1099 Alcohol use, unspecified with unspecified alcohol-induced disorder: Secondary | ICD-10-CM | POA: Diagnosis not present

## 2017-04-11 DIAGNOSIS — Z818 Family history of other mental and behavioral disorders: Secondary | ICD-10-CM | POA: Diagnosis not present

## 2017-04-11 DIAGNOSIS — E785 Hyperlipidemia, unspecified: Secondary | ICD-10-CM | POA: Diagnosis not present

## 2017-04-11 DIAGNOSIS — F419 Anxiety disorder, unspecified: Secondary | ICD-10-CM | POA: Diagnosis not present

## 2017-04-11 MED ORDER — DULOXETINE HCL 60 MG PO CPEP: ORAL_CAPSULE | ORAL | 0 refills | Status: DC

## 2017-04-11 MED ORDER — DULOXETINE HCL 30 MG PO CPEP: ORAL_CAPSULE | ORAL | 0 refills | Status: DC

## 2017-04-11 NOTE — Patient Instructions (Addendum)
1. Continue duloxetine 90 mg daily (60 mg + 30 mg) 2. Discontinue Trazodone 3. Return to clinic in three months for 15 mins

## 2017-06-21 DIAGNOSIS — E785 Hyperlipidemia, unspecified: Secondary | ICD-10-CM | POA: Diagnosis not present

## 2017-06-21 DIAGNOSIS — M791 Myalgia, unspecified site: Secondary | ICD-10-CM | POA: Diagnosis not present

## 2017-06-21 DIAGNOSIS — J301 Allergic rhinitis due to pollen: Secondary | ICD-10-CM | POA: Diagnosis not present

## 2017-06-21 DIAGNOSIS — F4310 Post-traumatic stress disorder, unspecified: Secondary | ICD-10-CM | POA: Diagnosis not present

## 2017-07-04 ENCOUNTER — Other Ambulatory Visit (HOSPITAL_COMMUNITY): Payer: Self-pay | Admitting: Psychiatry

## 2017-07-04 MED ORDER — DULOXETINE HCL 30 MG PO CPEP: ORAL_CAPSULE | ORAL | 0 refills | Status: DC

## 2017-07-04 MED ORDER — DULOXETINE HCL 60 MG PO CPEP: ORAL_CAPSULE | ORAL | 0 refills | Status: DC

## 2017-07-05 NOTE — Progress Notes (Signed)
[facility] MD/PA/NP OP Progress Note  07/09/2017 3:18 PM Albert Gonzalez  MRN:  [id number]  Chief Complaint:  Chief Complaint    Depression; Anxiety; Follow-up     HPI:  Patient presents for follow-up appointment for depression.  He states that he has been feeling good. His wife was admitted to the hospital twice since last appointment. He felt anxious and took xanax when his wife had asthma attack. There was another occasional when he felt stressed more and took xanax when his wife was "hyper" taking higher dose of steroid. He has been dealing with things well. He celebrated on mother's day, cooking steak and give his wife roses. He reports better relationship with his step son, who sees psychiatrist. He denies insomnia. He denies feeling depressed.  He has good concentration.  He denies anhedonia. He helps people at church. He denies SI.  He feels anxious and tense at times.  He takes Xanax once a week or less.    Per PMP,  Xanax 0.5 mg last filled on 04/16/2017, 360 tabs for 90 days  Visit Diagnosis:    ICD-10-CM   1. MDD (major depressive disorder), recurrent episode, moderate ([place]) F33.1     Past Psychiatric History:  I have reviewed the patient's psychiatry history in detail and updated the patient record. Outpatient: denies Psychiatry admission: denies Previous suicide attempt:  Past trials of medication: Fluoxetine, duloxetine, pristiq, quetiapine (nightmares), Ambien, Xanax,  History of violence: denies   Past Medical History:  Past Medical History:  Diagnosis Date  . Anxiety   . Arthritis   . Depression   . History of kidney stones   . Hyperlipidemia   . Inguinal hernia right   . Insomnia   . Myalgia     Past Surgical History:  Procedure Laterality Date  . COLONOSCOPY  02/08/2011   Procedure: COLONOSCOPY;  Surgeon: Daneil Dolin, MD;  [facility];  Service: Endoscopy;  Laterality: N/A;  1:30 PM  . INGUINAL HERNIA REPAIR Right 01/29/2017   Procedure: HERNIA  REPAIR INGUINAL ADULT WITH MESH;  Surgeon: Virl Cagey, MD;  Location: [facility];  Service: General;  Laterality: Right;  . KNEE ARTHROSCOPY WITH MEDIAL MENISECTOMY Right 04/05/2016   Procedure: RIGHT KNEE ARTHROSCOPY WITH PARTIAL  LATERAL MENISECTOMY, DEBRIDEMENT MEDIAL MENISCUS, ACL DEBRIDEMENT, MICRO FRACTURE OF FEMUR;  Surgeon: Carole Civil, MD;  Location: [facility];  Service: Orthopedics;  Laterality: Right;  . KNEE SURGERY     left  . KYPHOPLASTY    . TONSILLECTOMY      Family Psychiatric History: I have reviewed the patient's family history in detail and updated the patient record.  Family History:  Family History  Problem Relation Age of Onset  . Suicidality Father   . Colon cancer Neg Hx   . Liver disease Neg Hx   . Inflammatory bowel disease Neg Hx   . Anesthesia problems Neg Hx     Social History:  Social History   Socioeconomic History  . Marital status: Married    Spouse name: Not on file  . Number of children: 1  . Years of education: Not on file  . Highest education level: Not on file  Occupational History  . Occupation: respiratory therapist    Employer: [employer]  Social Needs  . Financial resource strain: Not on file  . Food insecurity:    Worry: Not on file    Inability: Not on file  . Transportation needs:  Medical: Not on file    Non-medical: Not on file  Tobacco Use  . Smoking status: Former Smoker    Packs/day: 0.50    Years: 20.00    Pack years: 10.00    Types: Pipe    Last attempt to quit: 04/03/1990    Years since quitting: 27.2  . Smokeless tobacco: Never Used  . Tobacco comment: quit many years ago  Substance and Sexual Activity  . Alcohol use: Yes    Comment: occassional  . Drug use: No  . Sexual activity: Yes  Lifestyle  . Physical activity:    Days per week: Not on file    Minutes per session: Not on file  . Stress: Not on file  Relationships  . Social connections:    Talks on phone: Not on file     Gets together: Not on file    Attends religious service: Not on file    Active member of club or organization: Not on file    Attends meetings of clubs or organizations: Not on file    Relationship status: Not on file  Other Topics Concern  . Not on file  Social History Narrative  . Not on file    Allergies:  Allergies  Allergen Reactions  . Sulfa Antibiotics Rash    Childhood     Metabolic Disorder Labs: No results found for: HGBA1C, MPG No results found for: PROLACTIN No results found for: CHOL, TRIG, HDL, CHOLHDL, VLDL, LDLCALC No results found for: TSH  Therapeutic Level Labs: No results found for: LITHIUM No results found for: VALPROATE No components found for:  CBMZ  Current Medications: Current Outpatient Medications  Medication Sig Dispense Refill  . ALPRAZolam (XANAX) 0.5 MG tablet Take 0.5 mg daily as needed by mouth for anxiety.     Marland Kitchen aspirin 325 MG tablet Take 325 mg by mouth daily.      Marland Kitchen atorvastatin (LIPITOR) 40 MG tablet Take 40 mg daily at 6 PM by mouth.     . Calcium Citrate-Vitamin D (CALCIUM + D PO) Take 2 tablets by mouth daily.    . diclofenac (CATAFLAM) 50 MG tablet Take 1 tablet (50 mg total) by mouth 2 (two) times daily. 180 tablet 5  . DULoxetine (CYMBALTA) 30 MG capsule Total of 90 mg daily (60 mg + 30 mg) 90 capsule 0  . DULoxetine (CYMBALTA) 60 MG capsule Total of 90 mg daily (60 mg + 30 mg) 90 capsule 0  . HYDROcodone-ibuprofen (VICOPROFEN) 7.5-200 MG tablet Take 1 tablet daily as needed by mouth for moderate pain.    Marland Kitchen ibuprofen (ADVIL,MOTRIN) 200 MG tablet Take 400 mg 2 (two) times daily as needed by mouth for mild pain (takes at least once every day). For pain     . Misc Natural Products (GLUCOSAMINE CHOND DOUBLE STR PO) Take 2 tablets by mouth daily.    . Multiple Vitamin (MULTIVITAMIN) tablet Take 1 tablet by mouth daily. Men's 50+    . Multiple Vitamins-Minerals (EQ VISION FORMULA 50+ PO) Take 1 tablet by mouth daily.    Marland Kitchen oxyCODONE  (OXY IR/ROXICODONE) 5 MG immediate release tablet Take 1 tablet (5 mg total) by mouth once as needed for severe pain or breakthrough pain. 15 tablet 0  . tiZANidine (ZANAFLEX) 4 MG tablet Take 4 mg daily as needed by mouth for muscle spasms.      No current facility-administered medications for this visit.      Musculoskeletal: Strength & Muscle Tone: within normal  limits Gait & Station: normal Patient leans: N/A  Psychiatric Specialty Exam: Review of Systems  Psychiatric/Behavioral: Negative for depression, hallucinations, memory loss, substance abuse and suicidal ideas. The patient is nervous/anxious. The patient does not have insomnia.   All other systems reviewed and are negative.   Blood pressure 126/85, pulse 76, height 5\' 11"  (1.803 m), weight 181 lb (82.1 kg), SpO2 97 %.Body mass index is 25.24 kg/m.  General Appearance: Fairly Groomed  Eye Contact:  Good  Speech:  Clear and Coherent  Volume:  Normal  Mood:  "good"  Affect:  Appropriate, Congruent and reactive  Thought Process:  Coherent  Orientation:  Full (Time, Place, and Person)  Thought Content: Logical   Suicidal Thoughts:  No  Homicidal Thoughts:  No  Memory:  Immediate;   Good  Judgement:  Good  Insight:  Good  Psychomotor Activity:  Normal  Concentration:  Concentration: Good and Attention Span: Good  Recall:  Good  Fund of Knowledge: Good  Language: Good  Akathisia:  No  Handed:  Right  AIMS (if indicated): not done  Assets:  Communication Skills Desire for Improvement  ADL's:  Intact  Cognition: WNL  Sleep:  Good   Screenings:   Assessment and Plan:  MARQUINN MESCHKE is a 67 y.o. year old male with a history of depression, dyslipidemia, who presents for follow up appointment for MDD (major depressive disorder), recurrent episode, moderate ([facility])  # MDD, moderate, recurrent without psychotic features # r/o PTSD Patient denies significant mood episodes since the last appointment except some anxiety  in the context of his wife becoming ill. Will continue duloxetine to target neurovegetative symptoms. Will consider tapering down at the next visit if he denies significant mood episode (on current dose since July 26th, 2018). He is advised to limit use of xanax if able. Discussed behavioral activation.   Plan I have reviewed and updated plans as below 1. Continue duloxetine 90 mg daily (60 mg + 30 mg) 2. He has been on Xanax. Prescribed by PCP 3. Discontinue Trazodone 4. Return to clinic in three monthsfor 15 mins  The patient demonstrates the following risk factors for suicide: Chronic risk factors for suicide include: psychiatric disorder of depressionand chronic pain. Acute risk factorsfor suicide include: unemployment and social withdrawal/isolation. Protective factorsfor this patient include: responsibility to others (children, family), coping skills and hope for the future. Considering these factors, the overall suicide risk at this point appears to be low. Patient isappropriate for outpatient follow up.  Norman Clay, MD 07/09/2017, 3:18 PM

## 2017-07-09 ENCOUNTER — Encounter (HOSPITAL_COMMUNITY): Payer: Self-pay | Admitting: Psychiatry

## 2017-07-09 ENCOUNTER — Ambulatory Visit (INDEPENDENT_AMBULATORY_CARE_PROVIDER_SITE_OTHER): Payer: Medicare Other | Admitting: Psychiatry

## 2017-07-09 VITALS — BP 126/85 | HR 76 | Ht 71.0 in | Wt 181.0 lb

## 2017-07-09 DIAGNOSIS — G8929 Other chronic pain: Secondary | ICD-10-CM

## 2017-07-09 DIAGNOSIS — F419 Anxiety disorder, unspecified: Secondary | ICD-10-CM | POA: Diagnosis not present

## 2017-07-09 DIAGNOSIS — R450 Nervousness: Secondary | ICD-10-CM | POA: Diagnosis not present

## 2017-07-09 DIAGNOSIS — Z560 Unemployment, unspecified: Secondary | ICD-10-CM | POA: Diagnosis not present

## 2017-07-09 DIAGNOSIS — F331 Major depressive disorder, recurrent, moderate: Secondary | ICD-10-CM | POA: Diagnosis not present

## 2017-07-09 DIAGNOSIS — Z818 Family history of other mental and behavioral disorders: Secondary | ICD-10-CM | POA: Diagnosis not present

## 2017-07-09 DIAGNOSIS — Z87891 Personal history of nicotine dependence: Secondary | ICD-10-CM | POA: Diagnosis not present

## 2017-07-09 NOTE — Patient Instructions (Addendum)
1. Continue duloxetine 90 mg daily (60 mg + 30 mg) 2. Return to clinic in three monthsfor 15 mins

## 2017-08-02 DIAGNOSIS — W57XXXA Bitten or stung by nonvenomous insect and other nonvenomous arthropods, initial encounter: Secondary | ICD-10-CM | POA: Diagnosis not present

## 2017-08-02 DIAGNOSIS — L0390 Cellulitis, unspecified: Secondary | ICD-10-CM | POA: Diagnosis not present

## 2017-08-02 DIAGNOSIS — F4310 Post-traumatic stress disorder, unspecified: Secondary | ICD-10-CM | POA: Diagnosis not present

## 2017-08-03 DIAGNOSIS — W57XXXA Bitten or stung by nonvenomous insect and other nonvenomous arthropods, initial encounter: Secondary | ICD-10-CM | POA: Diagnosis not present

## 2017-08-03 DIAGNOSIS — L0390 Cellulitis, unspecified: Secondary | ICD-10-CM | POA: Diagnosis not present

## 2017-08-27 ENCOUNTER — Other Ambulatory Visit: Payer: Self-pay | Admitting: Orthopedic Surgery

## 2017-08-28 ENCOUNTER — Telehealth: Payer: Self-pay | Admitting: Orthopedic Surgery

## 2017-08-28 MED ORDER — DICLOFENAC SODIUM 75 MG PO TBEC: 75.0000 mg | DELAYED_RELEASE_TABLET | Freq: Two times a day (BID) | ORAL | 5 refills | Status: DC

## 2017-08-28 NOTE — Telephone Encounter (Signed)
Changed per Dr Aline Brochure

## 2017-08-28 NOTE — Telephone Encounter (Signed)
Jonni Sanger from [facility] called stating that they no longer make CATAFLAM, so the prescription would need to be for Diclofenac. Dr. Aline Brochure e-scribed this request to the pharmacy early this morning.  Please call and advise

## 2017-09-24 NOTE — Progress Notes (Deleted)
BH MD/PA/NP OP Progress Note  09/24/2017 3:54 PM KWALI WRINKLE  MRN:  [id number]  Chief Complaint:  HPI: *** Visit Diagnosis: No diagnosis found.  Past Psychiatric History: Please see initial evaluation for full details. I have reviewed the history. No updates at this time.     Past Medical History:  Past Medical History:  Diagnosis Date  . Anxiety   . Arthritis   . Depression   . History of kidney stones   . Hyperlipidemia   . Inguinal hernia right   . Insomnia   . Myalgia     Past Surgical History:  Procedure Laterality Date  . COLONOSCOPY  02/08/2011   Procedure: COLONOSCOPY;  Surgeon: Daneil Dolin, MD;  Location: [facility];  Service: Endoscopy;  Laterality: N/A;  1:30 PM  . INGUINAL HERNIA REPAIR Right 01/29/2017   Procedure: HERNIA REPAIR INGUINAL ADULT WITH MESH;  Surgeon: Virl Cagey, MD;  Location: [facility];  Service: General;  Laterality: Right;  . KNEE ARTHROSCOPY WITH MEDIAL MENISECTOMY Right 04/05/2016   Procedure: RIGHT KNEE ARTHROSCOPY WITH PARTIAL  LATERAL MENISECTOMY, DEBRIDEMENT MEDIAL MENISCUS, ACL DEBRIDEMENT, MICRO FRACTURE OF FEMUR;  Surgeon: Carole Civil, MD;  Location: [facility];  Service: Orthopedics;  Laterality: Right;  . KNEE SURGERY     left  . KYPHOPLASTY    . TONSILLECTOMY      Family Psychiatric History: Please see initial evaluation for full details. I have reviewed the history. No updates at this time.     Family History:  Family History  Problem Relation Age of Onset  . Suicidality Father   . Colon cancer Neg Hx   . Liver disease Neg Hx   . Inflammatory bowel disease Neg Hx   . Anesthesia problems Neg Hx     Social History:  Social History   Socioeconomic History  . Marital status: Married    Spouse name: Not on file  . Number of children: 1  . Years of education: Not on file  . Highest education level: Not on file  Occupational History  . Occupation: respiratory therapist    Employer: [employer]  Social Needs  . Financial resource strain: Not on file  . Food insecurity:    Worry: Not on file    Inability: Not on file  . Transportation needs:    Medical: Not on file    Non-medical: Not on file  Tobacco Use  . Smoking status: Former Smoker    Packs/day: 0.50    Years: 20.00    Pack years: 10.00    Types: Pipe    Last attempt to quit: 04/03/1990    Years since quitting: 27.4  . Smokeless tobacco: Never Used  . Tobacco comment: quit many years ago  Substance and Sexual Activity  . Alcohol use: Yes    Comment: occassional  . Drug use: No  . Sexual activity: Yes  Lifestyle  . Physical activity:    Days per week: Not on file    Minutes per session: Not on file  . Stress: Not on file  Relationships  . Social connections:    Talks on phone: Not on file    Gets together: Not on file    Attends religious service: Not on file    Active member of club or organization: Not on file    Attends meetings of clubs or organizations: Not on file    Relationship status: Not on file  Other Topics Concern  .  Not on file  Social History Narrative  . Not on file    Allergies:  Allergies  Allergen Reactions  . Sulfa Antibiotics Rash    Childhood     Metabolic Disorder Labs: No results found for: HGBA1C, MPG No results found for: PROLACTIN No results found for: CHOL, TRIG, HDL, CHOLHDL, VLDL, LDLCALC No results found for: TSH  Therapeutic Level Labs: No results found for: LITHIUM No results found for: VALPROATE No components found for:  CBMZ  Current Medications: Current Outpatient Medications  Medication Sig Dispense Refill  . ALPRAZolam (XANAX) 0.5 MG tablet Take 0.5 mg daily as needed by mouth for anxiety.     Marland Kitchen aspirin 325 MG tablet Take 325 mg by mouth daily.      Marland Kitchen atorvastatin (LIPITOR) 40 MG tablet Take 40 mg daily at 6 PM by mouth.     . Calcium Citrate-Vitamin D (CALCIUM + D PO) Take 2 tablets by mouth daily.    . diclofenac (CATAFLAM) 50 MG tablet  TAKE ONE TABLET BY MOUTH TWICE A DAY 60 tablet 2  . diclofenac (VOLTAREN) 75 MG EC tablet Take 1 tablet (75 mg total) by mouth 2 (two) times daily with a meal. 60 tablet 5  . DULoxetine (CYMBALTA) 30 MG capsule Total of 90 mg daily (60 mg + 30 mg) 90 capsule 0  . DULoxetine (CYMBALTA) 60 MG capsule Total of 90 mg daily (60 mg + 30 mg) 90 capsule 0  . HYDROcodone-ibuprofen (VICOPROFEN) 7.5-200 MG tablet Take 1 tablet daily as needed by mouth for moderate pain.    Marland Kitchen ibuprofen (ADVIL,MOTRIN) 200 MG tablet Take 400 mg 2 (two) times daily as needed by mouth for mild pain (takes at least once every day). For pain     . Misc Natural Products (GLUCOSAMINE CHOND DOUBLE STR PO) Take 2 tablets by mouth daily.    . Multiple Vitamin (MULTIVITAMIN) tablet Take 1 tablet by mouth daily. Men's 50+    . Multiple Vitamins-Minerals (EQ VISION FORMULA 50+ PO) Take 1 tablet by mouth daily.    Marland Kitchen oxyCODONE (OXY IR/ROXICODONE) 5 MG immediate release tablet Take 1 tablet (5 mg total) by mouth once as needed for severe pain or breakthrough pain. 15 tablet 0  . tiZANidine (ZANAFLEX) 4 MG tablet Take 4 mg daily as needed by mouth for muscle spasms.      No current facility-administered medications for this visit.      Musculoskeletal: Strength & Muscle Tone: within normal limits Gait & Station: normal Patient leans: N/A  Psychiatric Specialty Exam: ROS  There were no vitals taken for this visit.There is no height or weight on file to calculate BMI.  General Appearance: Fairly Groomed  Eye Contact:  Good  Speech:  Clear and Coherent  Volume:  Normal  Mood:  {BHH MOOD:22306}  Affect:  {Affect (PAA):22687}  Thought Process:  Coherent  Orientation:  Full (Time, Place, and Person)  Thought Content: Logical   Suicidal Thoughts:  {ST/HT (PAA):22692}  Homicidal Thoughts:  {ST/HT (PAA):22692}  Memory:  Immediate;   Good  Judgement:  {Judgement (PAA):22694}  Insight:  {Insight (PAA):22695}  Psychomotor Activity:   Normal  Concentration:  Concentration: Good and Attention Span: Good  Recall:  Good  Fund of Knowledge: Good  Language: Good  Akathisia:  No  Handed:  Right  AIMS (if indicated): not done  Assets:  Communication Skills Desire for Improvement  ADL's:  Intact  Cognition: WNL  Sleep:  {BHH GOOD/FAIR/POOR:22877}   Screenings:  Assessment and Plan:  Albert ANDER is a 67 y.o. year old male with a history of depression, dyslipidemia , who presents for follow up appointment for No diagnosis found.  # MDD, moderate, recurrent without psychotic features # r/o PTSD  Patient denies significant mood episodes since the last appointment except some anxiety in the context of his wife becoming ill. Will continue duloxetine to target neurovegetative symptoms. Will consider tapering down at the next visit if he denies significant mood episode (on current dose since July 26th, 2018). He is advised to limit use of xanax if able. Discussed behavioral activation.   Plan  1. Continue duloxetine 90 mg daily(60 mg + 30 mg) 2. He has been on Xanax. Prescribed by PCP 3.Discontinue Trazodone 4. Return to clinic in three monthsfor 15 mins  The patient demonstrates the following risk factors for suicide: Chronic risk factors for suicide include: psychiatric disorder of depressionand chronic pain. Acute risk factorsfor suicide include: unemployment and social withdrawal/isolation. Protective factorsfor this patient include: responsibility to others (children, family), coping skills and hope for the future. Considering these factors, the overall suicide risk at this point appears to be low. Patient isappropriate for outpatient follow up.    Norman Clay, MD 09/24/2017, 3:54 PM

## 2017-10-01 ENCOUNTER — Ambulatory Visit (HOSPITAL_COMMUNITY): Payer: Medicare Other | Admitting: Psychiatry

## 2017-10-25 DIAGNOSIS — Z79891 Long term (current) use of opiate analgesic: Secondary | ICD-10-CM | POA: Diagnosis not present

## 2017-11-20 DIAGNOSIS — Z23 Encounter for immunization: Secondary | ICD-10-CM | POA: Diagnosis not present

## 2017-11-20 DIAGNOSIS — M1990 Unspecified osteoarthritis, unspecified site: Secondary | ICD-10-CM | POA: Diagnosis not present

## 2017-11-20 DIAGNOSIS — F419 Anxiety disorder, unspecified: Secondary | ICD-10-CM | POA: Diagnosis not present

## 2017-11-20 DIAGNOSIS — E785 Hyperlipidemia, unspecified: Secondary | ICD-10-CM | POA: Diagnosis not present

## 2017-11-20 DIAGNOSIS — M791 Myalgia, unspecified site: Secondary | ICD-10-CM | POA: Diagnosis not present

## 2017-12-27 ENCOUNTER — Encounter: Payer: Self-pay | Admitting: Internal Medicine

## 2017-12-31 ENCOUNTER — Other Ambulatory Visit: Payer: Self-pay | Admitting: Orthopedic Surgery

## 2018-02-25 DIAGNOSIS — M1990 Unspecified osteoarthritis, unspecified site: Secondary | ICD-10-CM | POA: Diagnosis not present

## 2018-02-25 DIAGNOSIS — M791 Myalgia, unspecified site: Secondary | ICD-10-CM | POA: Diagnosis not present

## 2018-02-25 DIAGNOSIS — F4310 Post-traumatic stress disorder, unspecified: Secondary | ICD-10-CM | POA: Diagnosis not present

## 2018-02-25 DIAGNOSIS — E785 Hyperlipidemia, unspecified: Secondary | ICD-10-CM | POA: Diagnosis not present

## 2018-02-28 ENCOUNTER — Other Ambulatory Visit: Payer: Self-pay | Admitting: Orthopedic Surgery

## 2018-03-23 ENCOUNTER — Other Ambulatory Visit: Payer: Self-pay | Admitting: Orthopedic Surgery

## 2018-05-27 DIAGNOSIS — F321 Major depressive disorder, single episode, moderate: Secondary | ICD-10-CM | POA: Diagnosis not present

## 2018-05-27 DIAGNOSIS — E785 Hyperlipidemia, unspecified: Secondary | ICD-10-CM | POA: Diagnosis not present

## 2018-05-27 DIAGNOSIS — F4310 Post-traumatic stress disorder, unspecified: Secondary | ICD-10-CM | POA: Diagnosis not present

## 2018-05-27 DIAGNOSIS — M1990 Unspecified osteoarthritis, unspecified site: Secondary | ICD-10-CM | POA: Diagnosis not present

## 2018-06-03 DIAGNOSIS — Z0000 Encounter for general adult medical examination without abnormal findings: Secondary | ICD-10-CM | POA: Diagnosis not present

## 2018-07-08 ENCOUNTER — Other Ambulatory Visit: Payer: Self-pay | Admitting: Orthopedic Surgery

## 2018-07-11 ENCOUNTER — Other Ambulatory Visit: Payer: Self-pay | Admitting: Radiology

## 2018-07-11 MED ORDER — DICLOFENAC POTASSIUM 50 MG PO TABS: 50.0000 mg | ORAL_TABLET | Freq: Two times a day (BID) | ORAL | 0 refills | Status: DC

## 2018-09-26 ENCOUNTER — Other Ambulatory Visit: Payer: Self-pay

## 2018-10-01 ENCOUNTER — Other Ambulatory Visit: Payer: Self-pay | Admitting: Orthopedic Surgery

## 2018-11-27 DIAGNOSIS — E785 Hyperlipidemia, unspecified: Secondary | ICD-10-CM | POA: Diagnosis not present

## 2018-11-27 DIAGNOSIS — Z79891 Long term (current) use of opiate analgesic: Secondary | ICD-10-CM | POA: Diagnosis not present

## 2018-11-27 DIAGNOSIS — Z23 Encounter for immunization: Secondary | ICD-10-CM | POA: Diagnosis not present

## 2018-11-27 DIAGNOSIS — F4310 Post-traumatic stress disorder, unspecified: Secondary | ICD-10-CM | POA: Diagnosis not present

## 2018-11-27 DIAGNOSIS — F419 Anxiety disorder, unspecified: Secondary | ICD-10-CM | POA: Diagnosis not present

## 2018-11-27 DIAGNOSIS — M1990 Unspecified osteoarthritis, unspecified site: Secondary | ICD-10-CM | POA: Diagnosis not present

## 2018-12-24 DIAGNOSIS — M25561 Pain in right knee: Secondary | ICD-10-CM | POA: Diagnosis not present

## 2018-12-24 DIAGNOSIS — G8929 Other chronic pain: Secondary | ICD-10-CM | POA: Diagnosis not present

## 2018-12-24 DIAGNOSIS — M25562 Pain in left knee: Secondary | ICD-10-CM | POA: Diagnosis not present

## 2018-12-24 DIAGNOSIS — Z79899 Other long term (current) drug therapy: Secondary | ICD-10-CM | POA: Diagnosis not present

## 2019-01-22 DIAGNOSIS — Z79899 Other long term (current) drug therapy: Secondary | ICD-10-CM | POA: Diagnosis not present

## 2019-01-22 DIAGNOSIS — M25562 Pain in left knee: Secondary | ICD-10-CM | POA: Diagnosis not present

## 2019-01-22 DIAGNOSIS — F329 Major depressive disorder, single episode, unspecified: Secondary | ICD-10-CM | POA: Diagnosis not present

## 2019-01-22 DIAGNOSIS — F411 Generalized anxiety disorder: Secondary | ICD-10-CM | POA: Diagnosis not present

## 2019-01-22 DIAGNOSIS — M25561 Pain in right knee: Secondary | ICD-10-CM | POA: Diagnosis not present

## 2019-03-07 DIAGNOSIS — G629 Polyneuropathy, unspecified: Secondary | ICD-10-CM | POA: Diagnosis not present

## 2019-03-07 DIAGNOSIS — Z79899 Other long term (current) drug therapy: Secondary | ICD-10-CM | POA: Diagnosis not present

## 2019-03-07 DIAGNOSIS — Z1159 Encounter for screening for other viral diseases: Secondary | ICD-10-CM | POA: Diagnosis not present

## 2019-04-07 DIAGNOSIS — M791 Myalgia, unspecified site: Secondary | ICD-10-CM | POA: Diagnosis not present

## 2019-04-07 DIAGNOSIS — Z79899 Other long term (current) drug therapy: Secondary | ICD-10-CM | POA: Diagnosis not present

## 2019-04-07 DIAGNOSIS — M25561 Pain in right knee: Secondary | ICD-10-CM | POA: Diagnosis not present

## 2019-04-07 DIAGNOSIS — M25562 Pain in left knee: Secondary | ICD-10-CM | POA: Diagnosis not present

## 2019-04-07 DIAGNOSIS — G8929 Other chronic pain: Secondary | ICD-10-CM | POA: Diagnosis not present

## 2019-05-05 DIAGNOSIS — M25561 Pain in right knee: Secondary | ICD-10-CM | POA: Diagnosis not present

## 2019-05-05 DIAGNOSIS — M791 Myalgia, unspecified site: Secondary | ICD-10-CM | POA: Diagnosis not present

## 2019-05-05 DIAGNOSIS — M25562 Pain in left knee: Secondary | ICD-10-CM | POA: Diagnosis not present

## 2019-05-05 DIAGNOSIS — Z79899 Other long term (current) drug therapy: Secondary | ICD-10-CM | POA: Diagnosis not present

## 2019-05-30 ENCOUNTER — Ambulatory Visit: Payer: Medicare Other | Attending: Internal Medicine

## 2019-05-30 DIAGNOSIS — Z23 Encounter for immunization: Secondary | ICD-10-CM

## 2019-05-30 NOTE — Progress Notes (Signed)
   Covid-19 Vaccination Clinic  Name:  Albert Gonzalez    MRN: [id number] DOB: Jun 04, 1950  05/30/2019  [name] was observed post Covid-19 immunization for 15 minutes without incident. He was provided with Vaccine Information Sheet and instruction to access the V-Safe system.   [name] was instructed to call 911 with any severe reactions post vaccine: Marland Kitchen Difficulty breathing  . Swelling of face and throat  . A fast heartbeat  . A bad rash all over body  . Dizziness and weakness   Immunizations Administered    Name Date Dose VIS Date Route   Moderna COVID-19 Vaccine 05/30/2019 11:58 AM 0.5 mL 01/28/2019 Intramuscular   Manufacturer: Moderna   Lot: [id number]   [id number]

## 2019-07-02 ENCOUNTER — Ambulatory Visit: Payer: Medicare Other | Attending: Internal Medicine

## 2019-07-02 DIAGNOSIS — Z23 Encounter for immunization: Secondary | ICD-10-CM

## 2019-07-02 NOTE — Progress Notes (Signed)
   Covid-19 Vaccination Clinic  Name:  Albert Gonzalez    MRN: [id number] DOB: 1950-05-31  07/02/2019  Mr. Kloos was observed post Covid-19 immunization for 15 minutes without incident. He was provided with Vaccine Information Sheet and instruction to access the V-Safe system.   Mr. Bireley was instructed to call 911 with any severe reactions post vaccine: Marland Kitchen Difficulty breathing  . Swelling of face and throat  . A fast heartbeat  . A bad rash all over body  . Dizziness and weakness   Immunizations Administered    Name Date Dose VIS Date Route   Moderna COVID-19 Vaccine 07/02/2019 11:39 AM 0.5 mL 01/2019 Intramuscular   Manufacturer: Moderna   Lot: [id number]   [id number]

## 2019-08-16 ENCOUNTER — Other Ambulatory Visit: Payer: Self-pay | Admitting: Orthopedic Surgery

## 2020-07-01 ENCOUNTER — Other Ambulatory Visit: Payer: Self-pay | Admitting: Orthopedic Surgery

## 2020-09-09 ENCOUNTER — Emergency Department (HOSPITAL_COMMUNITY): Payer: Medicare Other

## 2020-09-09 ENCOUNTER — Encounter (HOSPITAL_COMMUNITY): Payer: Self-pay | Admitting: Emergency Medicine

## 2020-09-09 ENCOUNTER — Observation Stay (HOSPITAL_COMMUNITY)
Admission: EM | Admit: 2020-09-09 | Discharge: 2020-09-10 | Disposition: A | Discharge status: Home / Self Care | Payer: Medicare Other | Attending: Family Medicine | Admitting: Family Medicine

## 2020-09-09 ENCOUNTER — Other Ambulatory Visit: Payer: Self-pay

## 2020-09-09 DIAGNOSIS — F32A Depression, unspecified: Secondary | ICD-10-CM | POA: Diagnosis present

## 2020-09-09 DIAGNOSIS — E785 Hyperlipidemia, unspecified: Secondary | ICD-10-CM | POA: Diagnosis present

## 2020-09-09 DIAGNOSIS — Y900 Blood alcohol level of less than 20 mg/100 ml: Secondary | ICD-10-CM | POA: Diagnosis not present

## 2020-09-09 DIAGNOSIS — A419 Sepsis, unspecified organism: Principal | ICD-10-CM | POA: Insufficient documentation

## 2020-09-09 DIAGNOSIS — Z20822 Contact with and (suspected) exposure to covid-19: Secondary | ICD-10-CM | POA: Insufficient documentation

## 2020-09-09 DIAGNOSIS — F419 Anxiety disorder, unspecified: Secondary | ICD-10-CM | POA: Diagnosis not present

## 2020-09-09 DIAGNOSIS — Z7982 Long term (current) use of aspirin: Secondary | ICD-10-CM | POA: Diagnosis not present

## 2020-09-09 DIAGNOSIS — M1990 Unspecified osteoarthritis, unspecified site: Secondary | ICD-10-CM | POA: Diagnosis not present

## 2020-09-09 DIAGNOSIS — Z79899 Other long term (current) drug therapy: Secondary | ICD-10-CM | POA: Diagnosis not present

## 2020-09-09 DIAGNOSIS — R778 Other specified abnormalities of plasma proteins: Secondary | ICD-10-CM | POA: Diagnosis not present

## 2020-09-09 DIAGNOSIS — Z87891 Personal history of nicotine dependence: Secondary | ICD-10-CM | POA: Insufficient documentation

## 2020-09-09 DIAGNOSIS — J189 Pneumonia, unspecified organism: Secondary | ICD-10-CM | POA: Insufficient documentation

## 2020-09-09 DIAGNOSIS — R42 Dizziness and giddiness: Secondary | ICD-10-CM | POA: Diagnosis not present

## 2020-09-09 DIAGNOSIS — D869 Sarcoidosis, unspecified: Secondary | ICD-10-CM | POA: Diagnosis present

## 2020-09-09 DIAGNOSIS — R509 Fever, unspecified: Secondary | ICD-10-CM | POA: Diagnosis present

## 2020-09-09 LAB — CBC WITH DIFFERENTIAL/PLATELET
Abs Immature Granulocytes: 0.01 10*3/uL (ref 0.00–0.07)
Basophils Absolute: 0 10*3/uL (ref 0.0–0.1)
Basophils Relative: 1 %
Eosinophils Absolute: 0 10*3/uL (ref 0.0–0.5)
Eosinophils Relative: 0 %
HCT: 35.6 % — ABNORMAL LOW (ref 39.0–52.0)
Hemoglobin: 11.4 g/dL — ABNORMAL LOW (ref 13.0–17.0)
Immature Granulocytes: 0 %
Lymphocytes Relative: 11 %
Lymphs Abs: 0.3 10*3/uL — ABNORMAL LOW (ref 0.7–4.0)
MCH: 25.3 pg — ABNORMAL LOW (ref 26.0–34.0)
MCHC: 32 g/dL (ref 30.0–36.0)
MCV: 78.9 fL — ABNORMAL LOW (ref 80.0–100.0)
Monocytes Absolute: 0.1 10*3/uL (ref 0.1–1.0)
Monocytes Relative: 5 %
Neutro Abs: 2.2 10*3/uL (ref 1.7–7.7)
Neutrophils Relative %: 83 %
Platelets: 168 10*3/uL (ref 150–400)
RBC: 4.51 MIL/uL (ref 4.22–5.81)
RDW: 15.3 % (ref 11.5–15.5)
WBC: 2.7 10*3/uL — ABNORMAL LOW (ref 4.0–10.5)
nRBC: 0 % (ref 0.0–0.2)

## 2020-09-09 LAB — COMPREHENSIVE METABOLIC PANEL
ALT: 45 U/L — ABNORMAL HIGH (ref 0–44)
AST: 64 U/L — ABNORMAL HIGH (ref 15–41)
Albumin: 3.7 g/dL (ref 3.5–5.0)
Alkaline Phosphatase: 104 U/L (ref 38–126)
Anion gap: 7 (ref 5–15)
BUN: 18 mg/dL (ref 8–23)
CO2: 25 mmol/L (ref 22–32)
Calcium: 8.4 mg/dL — ABNORMAL LOW (ref 8.9–10.3)
Chloride: 97 mmol/L — ABNORMAL LOW (ref 98–111)
Creatinine, Ser: 1.11 mg/dL (ref 0.61–1.24)
GFR, Estimated: >60 mL/min (ref >60)
Glucose, Bld: 110 mg/dL — ABNORMAL HIGH (ref 70–99)
Potassium: 3.8 mmol/L (ref 3.5–5.1)
Sodium: 129 mmol/L — ABNORMAL LOW (ref 135–145)
Total Bilirubin: 0.5 mg/dL (ref 0.3–1.2)
Total Protein: 6.7 g/dL (ref 6.5–8.1)

## 2020-09-09 LAB — LIPASE, BLOOD: Lipase: 36 U/L (ref 11–51)

## 2020-09-09 LAB — TROPONIN I (HIGH SENSITIVITY)
Troponin I (High Sensitivity): 22 ng/L — ABNORMAL HIGH (ref <18)
Troponin I (High Sensitivity): 19 ng/L — ABNORMAL HIGH (ref <18)

## 2020-09-09 LAB — RESP PANEL BY RT-PCR (FLU A&B, COVID) ARPGX2
Influenza A by PCR: NEGATIVE
Influenza B by PCR: NEGATIVE
SARS Coronavirus 2 by RT PCR: NEGATIVE

## 2020-09-09 LAB — URINALYSIS, ROUTINE W REFLEX MICROSCOPIC
Bacteria, UA: NONE SEEN
Bilirubin Urine: NEGATIVE
Glucose, UA: NEGATIVE mg/dL
Ketones, ur: 5 mg/dL — AB
Leukocytes,Ua: NEGATIVE
Nitrite: NEGATIVE
Protein, ur: 30 mg/dL — AB
Specific Gravity, Urine: 1.02 (ref 1.005–1.030)
pH: 7 (ref 5.0–8.0)

## 2020-09-09 LAB — RAPID URINE DRUG SCREEN, HOSP PERFORMED
Amphetamines: NOT DETECTED
Barbiturates: NOT DETECTED
Benzodiazepines: POSITIVE — AB
Cocaine: NOT DETECTED
Opiates: POSITIVE — AB
Tetrahydrocannabinol: NOT DETECTED

## 2020-09-09 LAB — BLOOD GAS, VENOUS
Acid-base deficit: 0.1 mmol/L (ref 0.0–2.0)
Bicarbonate: 23.4 mmol/L (ref 20.0–28.0)
FIO2: 21
O2 Saturation: 38.9 %
Patient temperature: 37.8
pCO2, Ven: 40.5 mmHg — ABNORMAL LOW (ref 44.0–60.0)
pH, Ven: 7.395 (ref 7.250–7.430)
pO2, Ven: <31 mmHg — CL (ref 32.0–45.0)

## 2020-09-09 LAB — LACTIC ACID, PLASMA
Lactic Acid, Venous: 1 mmol/L (ref 0.5–1.9)
Lactic Acid, Venous: 3.2 mmol/L (ref 0.5–1.9)
Lactic Acid, Venous: 2.5 mmol/L (ref 0.5–1.9)

## 2020-09-09 LAB — ETHANOL: Alcohol, Ethyl (B): <10 mg/dL (ref <10)

## 2020-09-09 LAB — PROCALCITONIN: Procalcitonin: 0.57 ng/mL

## 2020-09-09 LAB — MAGNESIUM: Magnesium: 2.1 mg/dL (ref 1.7–2.4)

## 2020-09-09 LAB — PROTIME-INR
INR: 1.2 (ref 0.8–1.2)
Prothrombin Time: 14.8 seconds (ref 11.4–15.2)

## 2020-09-09 LAB — APTT: aPTT: 36 seconds (ref 24–36)

## 2020-09-09 MED ORDER — METRONIDAZOLE 500 MG/100ML IV SOLN
500.0000 mg | Freq: Once | INTRAVENOUS | Status: AC
  Administered 2020-09-09: 500 mg via INTRAVENOUS
  Filled 2020-09-09: qty 100

## 2020-09-09 MED ORDER — HYDROCODONE-ACETAMINOPHEN 7.5-325 MG PO TABS
1.0000 | ORAL_TABLET | Freq: Four times a day (QID) | ORAL | Status: DC | PRN
  Administered 2020-09-09 (×2): 1 via ORAL
  Filled 2020-09-09 (×2): qty 1

## 2020-09-09 MED ORDER — ONDANSETRON HCL 4 MG/2ML IJ SOLN: 4.0000 mg | Freq: Four times a day (QID) | INTRAMUSCULAR | Status: DC | PRN

## 2020-09-09 MED ORDER — ALPRAZOLAM 0.5 MG PO TABS
0.5000 mg | ORAL_TABLET | Freq: Every day | ORAL | Status: DC | PRN
  Administered 2020-09-09: 0.5 mg via ORAL
  Filled 2020-09-09: qty 1

## 2020-09-09 MED ORDER — SENNA 8.6 MG PO TABS
1.0000 | ORAL_TABLET | Freq: Two times a day (BID) | ORAL | Status: DC
  Administered 2020-09-09 – 2020-09-10 (×2): 8.6 mg via ORAL
  Filled 2020-09-09 (×2): qty 1

## 2020-09-09 MED ORDER — DICLOFENAC POTASSIUM 50 MG PO TABS
50.0000 mg | ORAL_TABLET | Freq: Two times a day (BID) | ORAL | Status: DC
  Administered 2020-09-10: 50 mg via ORAL
  Filled 2020-09-09 (×8): qty 1

## 2020-09-09 MED ORDER — ASPIRIN 325 MG PO TABS
325.0000 mg | ORAL_TABLET | Freq: Every day | ORAL | Status: DC
  Administered 2020-09-10: 325 mg via ORAL
  Filled 2020-09-09: qty 1

## 2020-09-09 MED ORDER — BISACODYL 10 MG RE SUPP: 10.0000 mg | Freq: Every day | RECTAL | Status: DC | PRN

## 2020-09-09 MED ORDER — DULOXETINE HCL 60 MG PO CPEP
60.0000 mg | ORAL_CAPSULE | Freq: Every day | ORAL | Status: DC
  Administered 2020-09-09 – 2020-09-10 (×2): 60 mg via ORAL
  Filled 2020-09-09 (×2): qty 1

## 2020-09-09 MED ORDER — SODIUM CHLORIDE 0.9 % IV SOLN
500.0000 mg | INTRAVENOUS | Status: DC
  Administered 2020-09-09: 500 mg via INTRAVENOUS
  Filled 2020-09-09: qty 500

## 2020-09-09 MED ORDER — QUETIAPINE FUMARATE 25 MG PO TABS
25.0000 mg | ORAL_TABLET | Freq: Every day | ORAL | Status: DC
  Administered 2020-09-09: 25 mg via ORAL
  Filled 2020-09-09: qty 1

## 2020-09-09 MED ORDER — ONDANSETRON HCL 4 MG PO TABS: 4.0000 mg | ORAL_TABLET | Freq: Four times a day (QID) | ORAL | Status: DC | PRN

## 2020-09-09 MED ORDER — TIZANIDINE HCL 4 MG PO TABS
4.0000 mg | ORAL_TABLET | Freq: Every day | ORAL | Status: DC | PRN
  Administered 2020-09-09: 4 mg via ORAL
  Filled 2020-09-09: qty 1

## 2020-09-09 MED ORDER — TRAZODONE HCL 50 MG PO TABS
50.0000 mg | ORAL_TABLET | Freq: Every day | ORAL | Status: DC
  Administered 2020-09-09: 50 mg via ORAL
  Filled 2020-09-09: qty 1

## 2020-09-09 MED ORDER — VANCOMYCIN HCL 1500 MG/300ML IV SOLN: 1500.0000 mg | INTRAVENOUS | Status: DC

## 2020-09-09 MED ORDER — ENOXAPARIN SODIUM 40 MG/0.4ML IJ SOSY
40.0000 mg | PREFILLED_SYRINGE | INTRAMUSCULAR | Status: DC
  Administered 2020-09-09: 40 mg via SUBCUTANEOUS
  Filled 2020-09-09: qty 0.4

## 2020-09-09 MED ORDER — POLYETHYLENE GLYCOL 3350 17 G PO PACK: 17.0000 g | PACK | Freq: Every day | ORAL | Status: DC | PRN

## 2020-09-09 MED ORDER — VANCOMYCIN HCL 1750 MG/350ML IV SOLN
1750.0000 mg | Freq: Once | INTRAVENOUS | Status: DC
  Administered 2020-09-09: 1750 mg via INTRAVENOUS
  Filled 2020-09-09: qty 350

## 2020-09-09 MED ORDER — LACTATED RINGERS IV BOLUS (SEPSIS)
1000.0000 mL | Freq: Once | INTRAVENOUS | Status: AC
  Administered 2020-09-09: 1000 mL via INTRAVENOUS

## 2020-09-09 MED ORDER — DULOXETINE HCL 60 MG PO CPEP: 90.0000 mg | ORAL_CAPSULE | Freq: Every day | ORAL | Status: DC

## 2020-09-09 MED ORDER — SODIUM CHLORIDE 0.9 % IV SOLN: INTRAVENOUS | Status: DC

## 2020-09-09 MED ORDER — LACTATED RINGERS IV BOLUS (SEPSIS)
1500.0000 mL | Freq: Once | INTRAVENOUS | Status: AC
  Administered 2020-09-09: 1500 mL via INTRAVENOUS

## 2020-09-09 MED ORDER — SODIUM CHLORIDE 0.9 % IV SOLN
1.0000 g | INTRAVENOUS | Status: DC
  Administered 2020-09-09: 1 g via INTRAVENOUS
  Filled 2020-09-09: qty 10

## 2020-09-09 MED ORDER — SODIUM CHLORIDE 0.9 % IV SOLN
2.0000 g | Freq: Three times a day (TID) | INTRAVENOUS | Status: DC
  Administered 2020-09-09: 2 g via INTRAVENOUS
  Filled 2020-09-09: qty 2

## 2020-09-09 MED ORDER — ONDANSETRON HCL 4 MG/2ML IJ SOLN
4.0000 mg | Freq: Once | INTRAMUSCULAR | Status: AC
  Administered 2020-09-09: 4 mg via INTRAVENOUS
  Filled 2020-09-09: qty 2

## 2020-09-09 NOTE — Progress Notes (Signed)
Elink following Code Sepsis. 

## 2020-09-09 NOTE — Progress Notes (Signed)
Pharmacy Antibiotic Note  Albert Gonzalez is a 70 y.o. male admitted on 09/09/2020 with  unknown source of infection .  Pharmacy has been consulted for Vancomycin and Cefepime dosing.  Plan: Vancomycin 1750 mg IV x 1 dose. Vancomycin 1500 mg IV every 24 hours. Cefepime 2000 mg IV every 8 hours. Monitor labs, c/s, and vanco level as indicated.  Height: 5\' 11"  (180.3 cm) Weight: 80.7 kg (178 lb) IBW/kg (Calculated) : 75.3  Temp (24hrs), Avg:99.3 F (37.4 C), Min:98.6 F (37 C), Max:100 F (37.8 C)  Recent Labs  Lab 09/09/20 0938 09/09/20 0956  WBC  --  2.7*  CREATININE  --  1.11  LATICACIDVEN 1.0  --     Estimated Creatinine Clearance: 65 mL/min (by C-G formula based on SCr of 1.11 mg/dL).    Allergies  Allergen Reactions   Sulfa Antibiotics Rash    Childhood     Antimicrobials this admission: Vanco 7/14 >>  Cefepime 7/14 >>    Microbiology results: 7/14 BCx: pending 7/14 UCx: pending    Thank you for allowing pharmacy to be a part of this patient's care.  Ramond Craver 09/09/2020 12:13 PM

## 2020-09-09 NOTE — ED Provider Notes (Signed)
[facility] EMERGENCY DEPARTMENT Provider Note   CSN: [id number] Arrival date & time: 09/09/20  0913     History Chief Complaint  Patient presents with   Fall   Fever    Malaise     Albert Gonzalez is a 70 y.o. male.   Fall Associated symptoms include chest pain. Pertinent negatives include no abdominal pain, no headaches and no shortness of breath.  Fever Associated symptoms: chest pain and nausea   Associated symptoms: no cough (none currently), no diarrhea, no headaches, no rash and no vomiting     Albert Gonzalez is a 70 y.o. male, with a history of anxiety, depression, hyperlipidemia, presenting to the ED generally feeling unwell for the last couple weeks. EMS states they received a call for a generalized sick patient.  Wife on scene states patient sustained witnessed fall with subsequent loss of consciousness for unknown amount of time. Patient has been experiencing episodes of lightheadedness causing falls for the last couple weeks. He has also been experiencing fever up to 103 F, nausea, fatigue, loss of appetite, leg cramps, generalized weakness.  He had a cough last week, but states this has resolved. He endorses episodes of chest pain as recently as yesterday or the day before, but none currently. He states he took hydrocodone/APAP, 1 tablet, around 6 or 7 AM this morning.  He takes this medication due to back pain following previous falls.  He also took one of his 0.5 mg Xanax this morning.  COVID vaccinated x3.  Denies anticoagulation.  Denies neck/back pain, shortness of breath, chest pain, abdominal pain, vomiting, diarrhea, hematochezia/melena, urinary symptoms, extremity injuries, or any other complaints.    Past Medical History:  Diagnosis Date   Anxiety    Arthritis    Depression    History of kidney stones    Hyperlipidemia    Inguinal hernia right    Insomnia    Myalgia     Patient Active Problem List   Diagnosis Date Noted   Sepsis due to  pneumonia ([place]) 09/09/2020   Right inguinal hernia    MDD (major depressive disorder), recurrent episode, moderate ([place]) 08/24/2016   Acute medial meniscus tear of left knee    Tear of lateral meniscus of right knee, current    Arthritis of knee, right    Deficiency of anterior cruciate ligament of right knee    Abnormal CT scan, colon 02/01/2011   Constipation 02/01/2011    Past Surgical History:  Procedure Laterality Date   COLONOSCOPY  02/08/2011   Procedure: COLONOSCOPY;  Surgeon: Daneil Dolin, MD;  Location: [facility] SUITE;  Service: Endoscopy;  Laterality: N/A;  1:30 PM   INGUINAL HERNIA REPAIR Right 01/29/2017   Procedure: HERNIA REPAIR INGUINAL ADULT WITH MESH;  Surgeon: Virl Cagey, MD;  Location: [facility] ORS;  Service: General;  Laterality: Right;   KNEE ARTHROSCOPY WITH MEDIAL MENISECTOMY Right 04/05/2016   Procedure: RIGHT KNEE ARTHROSCOPY WITH PARTIAL  LATERAL MENISECTOMY, DEBRIDEMENT MEDIAL MENISCUS, ACL DEBRIDEMENT, MICRO FRACTURE OF FEMUR;  Surgeon: Carole Civil, MD;  Location: [facility] ORS;  Service: Orthopedics;  Laterality: Right;   KNEE SURGERY     left   KYPHOPLASTY     TONSILLECTOMY         Family History  Problem Relation Age of Onset   Suicidality Father    Colon cancer Neg Hx    Liver disease Neg Hx    Inflammatory bowel disease Neg Hx    Anesthesia problems  Neg Hx     Social History   Tobacco Use   Smoking status: Former    Packs/day: 0.50    Years: 20.00    Pack years: 10.00    Types: Pipe, Cigarettes    Quit date: 04/03/1990    Years since quitting: 30.4   Smokeless tobacco: Never   Tobacco comments:    quit many years ago  Vaping Use   Vaping Use: Never used  Substance Use Topics   Alcohol use: Yes    Comment: occassional   Drug use: No    Home Medications Prior to Admission medications   Medication Sig Start Date End Date Taking? Authorizing Provider  ALPRAZolam Duanne Moron) 0.5 MG tablet Take 0.5 mg daily as needed by mouth for  anxiety.  11/07/16  Yes [provider]  aspirin 325 MG tablet Take 325 mg by mouth daily.     Yes [provider]  atorvastatin (LIPITOR) 40 MG tablet Take 40 mg daily at 6 PM by mouth.    Yes [provider]  Calcium Citrate-Vitamin D (CALCIUM + D PO) Take 2 tablets by mouth daily.   Yes [provider]  diclofenac (CATAFLAM) 50 MG tablet TAKE 1 TABLET BY MOUTH  TWICE DAILY Patient taking differently: Take 50 mg by mouth 2 (two) times daily. 07/05/20  Yes Carole Civil, MD  DULoxetine (CYMBALTA) 60 MG capsule Total of 90 mg daily (60 mg + 30 mg) Patient taking differently: Take 60 mg by mouth daily. 07/04/17  Yes Hisada, Elie Goody, MD  HYDROcodone-acetaminophen (NORCO) 7.5-325 MG tablet Take 1 tablet by mouth 2 (two) times daily. 09/07/20  Yes [provider]  ibuprofen (ADVIL,MOTRIN) 200 MG tablet Take 400 mg 2 (two) times daily as needed by mouth for mild pain (takes at least once every day). For pain    Yes [provider]  Misc Natural Products (GLUCOSAMINE CHOND DOUBLE STR PO) Take 2 tablets by mouth daily.   Yes [provider]  Multiple Vitamin (MULTIVITAMIN) tablet Take 1 tablet by mouth daily. Men's 50+   Yes [provider]  Multiple Vitamins-Minerals (EQ VISION FORMULA 50+ PO) Take 1 tablet by mouth daily.   Yes [provider]  QUEtiapine (SEROQUEL) 25 MG tablet Take 25 mg by mouth at bedtime. 04/12/20  Yes [provider]  tiZANidine (ZANAFLEX) 4 MG tablet Take 4 mg daily as needed by mouth for muscle spasms.    Yes [provider]  traZODone (DESYREL) 50 MG tablet Take 50 mg by mouth at bedtime. 09/04/20  Yes [provider]  diclofenac (VOLTAREN) 75 MG EC tablet Take 1 tablet (75 mg total) by mouth 2 (two) times daily with a meal. Patient not taking: No sig reported 08/28/17   Carole Civil, MD  DULoxetine (CYMBALTA) 30 MG capsule Total of 90 mg daily (60 mg + 30 mg) 07/04/17    Norman Clay, MD  oxyCODONE (OXY IR/ROXICODONE) 5 MG immediate release tablet Take 1 tablet (5 mg total) by mouth once as needed for severe pain or breakthrough pain. Patient not taking: No sig reported 01/29/17   Virl Cagey, MD  ramelteon (ROZEREM) 8 MG tablet Take 8 mg by mouth at bedtime. Patient not taking: No sig reported 06/08/20   [provider]    Allergies    Sulfa antibiotics  Review of Systems   Review of Systems  Constitutional:  Positive for appetite change, fatigue and fever.  Respiratory:  Negative for cough (none  currently) and shortness of breath.   Cardiovascular:  Positive for chest pain. Negative for leg swelling.  Gastrointestinal:  Positive for nausea. Negative for abdominal pain, blood in stool, diarrhea and vomiting.  Musculoskeletal:  Negative for back pain and neck pain.  Skin:  Negative for rash and wound.  Neurological:  Positive for syncope, weakness (generally) and light-headedness. Negative for headaches.  All other systems reviewed and are negative.  Physical Exam Updated Vital Signs BP (!) 92/59 (BP Location: Left Arm)   Pulse 86   Temp 98.6 F (37 C) (Oral)   Resp 18   Ht 5\' 11"  (1.803 m)   Wt 80.7 kg   SpO2 93%   BMI 24.83 kg/m   Physical Exam Vitals and nursing note reviewed.  Constitutional:      General: He is not in acute distress.    Appearance: He is well-developed. He is not diaphoretic.  HENT:     Head: Normocephalic and atraumatic.     Mouth/Throat:     Mouth: Mucous membranes are dry.     Pharynx: Oropharynx is clear.  Eyes:     Conjunctiva/sclera: Conjunctivae normal.  Neck:     Comments: Full left and right range of motion of the head and neck without pain or noted difficulty. Cardiovascular:     Rate and Rhythm: Normal rate and regular rhythm.     Pulses: Normal pulses.          Radial pulses are 2+ on the right side and 2+ on the left side.       Posterior tibial pulses are 2+ on the right side and  2+ on the left side.     Heart sounds: Normal heart sounds.     Comments: Tactile temperature in the extremities appropriate and equal bilaterally. Pulmonary:     Effort: Pulmonary effort is normal. No respiratory distress.     Breath sounds: Normal breath sounds.  Abdominal:     Palpations: Abdomen is soft.     Tenderness: There is no abdominal tenderness. There is no guarding.  Musculoskeletal:     Cervical back: Normal range of motion and neck supple. No tenderness.     Right lower leg: No edema.     Left lower leg: No edema.     Comments: Normal motor function intact in all extremities. No midline spinal tenderness. The patient's extremities were examined, palpated, and the joints were ranged without evidence of tenderness, swelling, deformity, instability, or pain with range of motion of the joints.  Lymphadenopathy:     Cervical: No cervical adenopathy.  Skin:    General: Skin is warm and dry.  Neurological:     Mental Status: He is alert and oriented to person, place, and time.     Comments: No noted acute cognitive deficit. Sensation grossly intact to light touch in the extremities.   Grip strengths equal bilaterally.   Strength 5/5 in all extremities.  Coordination intact.  Cranial nerves III-XII grossly intact.  Handles oral secretions without noted difficulty.  No noted phonation or speech deficit. No facial droop.   Psychiatric:        Mood and Affect: Mood and affect normal.        Speech: Speech normal.        Behavior: Behavior normal.    ED Results / Procedures / Treatments   Labs (all labs ordered are listed, but only abnormal results are displayed) Labs Reviewed  LACTIC ACID, PLASMA - Abnormal; Notable for  the following components:      Result Value   Lactic Acid, Venous 3.2 (*)    All other components within normal limits  COMPREHENSIVE METABOLIC PANEL - Abnormal; Notable for the following components:   Sodium 129 (*)    Chloride 97 (*)    Glucose, Bld  110 (*)    Calcium 8.4 (*)    AST 64 (*)    ALT 45 (*)    All other components within normal limits  CBC WITH DIFFERENTIAL/PLATELET - Abnormal; Notable for the following components:   WBC 2.7 (*)    Hemoglobin 11.4 (*)    HCT 35.6 (*)    MCV 78.9 (*)    MCH 25.3 (*)    Lymphs Abs 0.3 (*)    All other components within normal limits  URINALYSIS, ROUTINE W REFLEX MICROSCOPIC - Abnormal; Notable for the following components:   Hgb urine dipstick MODERATE (*)    Ketones, ur 5 (*)    Protein, ur 30 (*)    All other components within normal limits  BLOOD GAS, VENOUS - Abnormal; Notable for the following components:   pCO2, Ven 40.5 (*)    pO2, Ven <31.0 (*)    All other components within normal limits  RAPID URINE DRUG SCREEN, HOSP PERFORMED - Abnormal; Notable for the following components:   Opiates POSITIVE (*)    Benzodiazepines POSITIVE (*)    All other components within normal limits  TROPONIN I (HIGH SENSITIVITY) - Abnormal; Notable for the following components:   Troponin I (High Sensitivity) 22 (*)    All other components within normal limits  TROPONIN I (HIGH SENSITIVITY) - Abnormal; Notable for the following components:   Troponin I (High Sensitivity) 19 (*)    All other components within normal limits  RESP PANEL BY RT-PCR (FLU A&B, COVID) ARPGX2  URINE CULTURE  CULTURE, BLOOD (ROUTINE X 2)  CULTURE, BLOOD (ROUTINE X 2)  RESPIRATORY PANEL BY PCR  LACTIC ACID, PLASMA  PROTIME-INR  APTT  LIPASE, BLOOD  ETHANOL  MAGNESIUM    EKG EKG Interpretation  Date/Time:  Thursday September 09 2020 09:21:22 EDT Ventricular Rate:  83 PR Interval:  158 QRS Duration: 95 QT Interval:  341 QTC Calculation: 401 R Axis:   26 Text Interpretation: Sinus rhythm Confirmed by Fredia Sorrow [phone]) on 09/09/2020 9:49:36 AM  Radiology CT Head Wo Contrast  Result Date: 09/09/2020 CLINICAL DATA:  Syncope and fall. EXAM: CT HEAD WITHOUT CONTRAST TECHNIQUE: Contiguous axial images were  obtained from the base of the skull through the vertex without intravenous contrast. COMPARISON:  CT head dated April 21, 2008. FINDINGS: Brain: No evidence of acute infarction, hemorrhage, hydrocephalus, extra-axial collection or mass lesion/mass effect. Chronic lacunar infarct in the left subinsular white matter again noted. Vascular: No hyperdense vessel or unexpected calcification. Skull: Normal. Negative for fracture or focal lesion. Sinuses/Orbits: No acute finding. Other: None. IMPRESSION: 1. No acute intracranial abnormality. Electronically Signed   By: Titus Dubin M.D.   On: 09/09/2020 11:41   DG Pelvis Portable  Result Date: 09/09/2020 CLINICAL DATA:  70 year old male with possible sepsis. Fall. EXAM: PORTABLE PELVIS 1-2 VIEWS COMPARISON:  CT Abdomen and Pelvis 06/26/2010. FINDINGS: Portable AP supine view at 1010 hours. Bone mineralization is within normal limits. Femoral heads are normally located. Proximal femurs appear stable since 2012 in grossly intact. There is chronic asymmetric right hip joint space loss and spurring. No pelvis fracture identified. SI joints and pubic symphysis appear normal. Negative visible bowel  gas, lower abdominal visceral contours. IMPRESSION: No acute fracture or dislocation identified about the pelvis. Chronic right hip osteoarthritis. Electronically Signed   By: Genevie Ann M.D.   On: 09/09/2020 10:44   DG Chest Port 1 View  Result Date: 09/09/2020 CLINICAL DATA:  71 year old male with possible sepsis. Fall. Former smoker. EXAM: PORTABLE CHEST 1 VIEW COMPARISON:  Chest radiograph 11/15/2009. FINDINGS: Portable AP upright view at 1007 hours. Stable lung volumes. Mediastinal contours remain normal. Coarse bilateral pulmonary interstitial opacity is new or increased since 2011, and asymmetrically greater on the right. No definite pleural effusion. No pneumothorax or consolidation. Negative visible bowel gas in the upper abdomen. No acute osseous abnormality  identified. IMPRESSION: Coarse bilateral pulmonary interstitial opacity is new since 2011, greater in the right lung, and nonspecific. This could be chronic interstitial lung disease, but acute viral/atypical respiratory infection, or less likely asymmetric edema, are not excluded. Electronically Signed   By: Genevie Ann M.D.   On: 09/09/2020 10:43    Procedures Procedures   Medications Ordered in ED Medications  ceFEPIme (MAXIPIME) 2 g in sodium chloride 0.9 % 100 mL IVPB (2 g Intravenous New Bag/Given 09/09/20 1310)  vancomycin (VANCOREADY) IVPB 1750 mg/350 mL (has no administration in time range)  vancomycin (VANCOREADY) IVPB 1500 mg/300 mL (has no administration in time range)  lactated ringers bolus 1,000 mL (0 mLs Intravenous Stopped 09/09/20 1210)  ondansetron (ZOFRAN) injection 4 mg (4 mg Intravenous Given 09/09/20 1025)  lactated ringers bolus 1,500 mL (1,500 mLs Intravenous New Bag/Given 09/09/20 1206)  metroNIDAZOLE (FLAGYL) IVPB 500 mg (0 mg Intravenous Stopped 09/09/20 1311)    ED Course  I have reviewed the triage vital signs and the nursing notes.  Pertinent labs & imaging results that were available during my care of the patient were reviewed by me and considered in my medical decision making (see chart for details).  Clinical Course as of 09/09/20 1351  Thu Sep 09, 2020  1349 Spoke with Dr. Bonner Puna, hospitalist.  [SJ]    Clinical Course User Index [SJ] Kayin Osment, Helane Gunther, PA-C   MDM Rules/Calculators/A&P                          Patient presents overall feeling poorly and generally weak.  Fever, lack of appetite, falls, intermittent cough. Hypotension, subjective fever with possibility for infection.  Once leukopenia was discovered, code sepsis activated.  Antibiotics for undifferentiated infection were ordered. I personally reviewed and interpreted the patient's labs and imaging studies. COVID and influenza negative. Troponin very mildly elevated, but flat. Initial lactic acid  unremarkable, rising with the second sample. Hypotension improved, but required 30 cc/kg fluid bolus.  Due to the patient's hypotension and rising lactic acid, we think it appropriate to admit the patient for further observation and management.  Findings and plan of care discussed with attending physician, Fredia Sorrow, MD. Dr. Rogene Houston personally evaluated and examined this patient.   Vitals:   09/09/20 1205 09/09/20 1230 09/09/20 1308 09/09/20 1330  BP: 105/65 103/63 117/72 106/64  Pulse: 77 72 77 83  Resp: 19 (!) 23 20 16   Temp:      TempSrc:      SpO2: 99% 100% 99% 93%  Weight:      Height:         Final Clinical Impression(s) / ED Diagnoses Final diagnoses:  Multifocal pneumonia    Rx / DC Orders ED Discharge Orders     None  Lorayne Bender, PA-C 09/09/20 1352    Fredia Sorrow, MD 09/09/20 1553

## 2020-09-09 NOTE — H&P (Signed)
History and Physical   Albert Gonzalez FVC:[id number] DOB: 11-Sep-1950 DOA: 09/09/2020  Referring MD/NP/PA: Arlean Hopping, PA PCP: Emelia Loron, NP Outpatient Specialists: [facility] [place], [place]  Patient coming from: Home  Chief Complaint: Fever, fall  HPI: Albert Gonzalez is a 70 y.o. male with a history of anxiety, depression, HLD, and sarcoidosis not requiring treatment who presented to the ED by EMS on the insistence of his wife for 2-3 days of worsening fevers, fatigue, poor per oral intake. He reports feeling progressively more ill with fevers to 103F with nonproductive cough, poor per oral intake, diffuse weakness, and ultimately consented to his wife calling EMS. When they arrived he felt weak and fell hitting his head.   ED Course: He was hypotensive and normothermic. BP improved with IV fluids. Labs revealed hyponatremia, hypochloremia, lymphopenia, ketonuria without glucosuria, and CXR demonstrated diffuse bilateral interstitial infiltrates. ECG w/NSR and troponins mildly elevated at 22 > 19. Initial lactic acid normal but follow up elevated to 3.2. Broad antibiotics given after cultures drawn, 30cc/kg IVF given, admission requested.   Review of Systems: +Chest pain across the entire chest mostly with exertion for 3 days. No orthopnea, PND, dyspnea, leg swelling. Legs ache a lot and he has trouble with falling chronically. No palpitations, rashes, wheezing, rhinorrhea, congestion, vision changes, hearing changes +some ear pains intermittently chronically,  , and per HPI. All others reviewed and are negative.   Past Medical History:  Diagnosis Date   Anxiety    Arthritis    Depression    History of kidney stones    Hyperlipidemia    Inguinal hernia right    Insomnia    Myalgia    Past Surgical History:  Procedure Laterality Date   COLONOSCOPY  02/08/2011   Procedure: COLONOSCOPY;  Surgeon: Daneil Dolin, MD;  Location: [facility];  Service: Endoscopy;   Laterality: N/A;  1:30 PM   INGUINAL HERNIA REPAIR Right 01/29/2017   Procedure: HERNIA REPAIR INGUINAL ADULT WITH MESH;  Surgeon: Virl Cagey, MD;  Location: [facility];  Service: General;  Laterality: Right;   KNEE ARTHROSCOPY WITH MEDIAL MENISECTOMY Right 04/05/2016   Procedure: RIGHT KNEE ARTHROSCOPY WITH PARTIAL  LATERAL MENISECTOMY, DEBRIDEMENT MEDIAL MENISCUS, ACL DEBRIDEMENT, MICRO FRACTURE OF FEMUR;  Surgeon: Carole Civil, MD;  Location: [facility];  Service: Orthopedics;  Laterality: Right;   KNEE SURGERY     left   KYPHOPLASTY     TONSILLECTOMY     - Remote smoker, retired from [employer]. 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 He doesn't smoke or drink heavily or use illicit drugs.    reports that he quit smoking about 30 years ago. His smoking use included pipe. He has a 10.00 pack-year smoking history. He has never used smokeless tobacco. He reports current alcohol use. He reports that he does not use drugs. Allergies  Allergen Reactions   Sulfa Antibiotics Rash    Childhood    Family History  Problem Relation Age of Onset   Suicidality Father    Colon cancer Neg Hx    Liver disease Neg Hx    Inflammatory bowel disease Neg Hx    Anesthesia problems Neg Hx    - Family history otherwise reviewed and not pertinent.  Prior to Admission medications   Medication Sig Start Date End Date Taking? Authorizing Provider  ALPRAZolam Duanne Moron) 0.5 MG tablet Take 0.5 mg daily as needed by  mouth for anxiety.  11/07/16  Yes [provider]  aspirin 325 MG tablet Take 325 mg by mouth daily.     Yes [provider]  atorvastatin (LIPITOR) 40 MG tablet Take 40 mg daily at 6 PM by mouth.    Yes [provider]  Calcium Citrate-Vitamin D (CALCIUM + D PO) Take 2 tablets by mouth daily.   Yes [provider]  diclofenac (CATAFLAM) 50 MG tablet TAKE 1 TABLET BY MOUTH  TWICE DAILY Patient taking differently: Take 50  mg by mouth 2 (two) times daily. 07/05/20  Yes Carole Civil, MD  DULoxetine (CYMBALTA) 60 MG capsule Total of 90 mg daily (60 mg + 30 mg) Patient taking differently: Take 60 mg by mouth daily. 07/04/17  Yes Hisada, Elie Goody, MD  HYDROcodone-acetaminophen (NORCO) 7.5-325 MG tablet Take 1 tablet by mouth 2 (two) times daily. 09/07/20  Yes [provider]  ibuprofen (ADVIL,MOTRIN) 200 MG tablet Take 400 mg 2 (two) times daily as needed by mouth for mild pain (takes at least once every day). For pain    Yes [provider]  Misc Natural Products (GLUCOSAMINE CHOND DOUBLE STR PO) Take 2 tablets by mouth daily.   Yes [provider]  Multiple Vitamin (MULTIVITAMIN) tablet Take 1 tablet by mouth daily. Men's 50+   Yes [provider]  Multiple Vitamins-Minerals (EQ VISION FORMULA 50+ PO) Take 1 tablet by mouth daily.   Yes [provider]  QUEtiapine (SEROQUEL) 25 MG tablet Take 25 mg by mouth at bedtime. 04/12/20  Yes [provider]  tiZANidine (ZANAFLEX) 4 MG tablet Take 4 mg daily as needed by mouth for muscle spasms.    Yes [provider]  traZODone (DESYREL) 50 MG tablet Take 50 mg by mouth at bedtime. 09/04/20  Yes [provider]  diclofenac (VOLTAREN) 75 MG EC tablet Take 1 tablet (75 mg total) by mouth 2 (two) times daily with a meal. Patient not taking: No sig reported 08/28/17   Carole Civil, MD  DULoxetine (CYMBALTA) 30 MG capsule Total of 90 mg daily (60 mg + 30 mg) 07/04/17   Norman Clay, MD  oxyCODONE (OXY IR/ROXICODONE) 5 MG immediate release tablet Take 1 tablet (5 mg total) by mouth once as needed for severe pain or breakthrough pain. Patient not taking: No sig reported 01/29/17   Virl Cagey, MD  ramelteon (ROZEREM) 8 MG tablet Take 8 mg by mouth at bedtime. Patient not taking: No sig reported 06/08/20   [provider]    Physical Exam: Vitals:   09/09/20 1230 09/09/20 1308 09/09/20 1330  09/09/20 1400  BP: 103/63 117/72 106/64 108/66  Pulse: 72 77 83 82  Resp: (!) 23 20 16 11   Temp:      TempSrc:      SpO2: 100% 99% 93% 98%  Weight:      Height:       Constitutional: 70 y.o. male in no distress, calm demeanor Eyes: Lids and conjunctivae normal, PERRL ENMT: Mucous membranes are moist. Posterior pharynx clear of any exudate or lesions. Normal dentition.  Neck: normal, supple, no masses, no thyromegaly Respiratory: Non-labored breathing room air without accessory muscle use. Single inspiratory wheeze in right midlung, otherwise clear breath sounds to auscultation bilaterally Cardiovascular: Regular rate and rhythm, no murmurs, rubs, or gallops. No carotid bruits. No JVD. No pitting LE edema. Palpable pedal pulses. Abdomen: Normoactive bowel sounds. No tenderness, non-distended, and no masses palpated. No hepatosplenomegaly. GU: No  indwelling catheter Musculoskeletal: No clubbing / cyanosis. No joint deformity upper and lower extremities. Good ROM, no contractures. Normal muscle tone.  Skin: Warm, dry. No rashes, wounds, or ulcers. Decreased hair growth on legs. Neurologic: CN II-XII grossly intact. Speech normal. No focal deficits in motor strength or sensation in all extremities.  Psychiatric: Alert and oriented x3. Normal judgment and insight. Mood a bit anxious with congruent and pleasant affect.   Labs on Admission: I have personally reviewed following labs and imaging studies  CBC: Recent Labs  Lab 09/09/20 0956  WBC 2.7*  NEUTROABS 2.2  HGB 11.4*  HCT 35.6*  MCV 78.9*  PLT 008   Basic Metabolic Panel: Recent Labs  Lab 09/09/20 0956  NA 129*  K 3.8  CL 97*  CO2 25  GLUCOSE 110*  BUN 18  CREATININE 1.11  CALCIUM 8.4*  MG 2.1   GFR: Estimated Creatinine Clearance: 65 mL/min (by C-G formula based on SCr of 1.11 mg/dL). Liver Function Tests: Recent Labs  Lab 09/09/20 0956  AST 64*  ALT 45*  ALKPHOS 104  BILITOT 0.5  PROT 6.7  ALBUMIN 3.7    Recent Labs  Lab 09/09/20 0956  LIPASE 36   No results for input(s): AMMONIA in the last 168 hours. Coagulation Profile: Recent Labs  Lab 09/09/20 0956  INR 1.2   Cardiac Enzymes: No results for input(s): CKTOTAL, CKMB, CKMBINDEX, TROPONINI in the last 168 hours. BNP (last 3 results) No results for input(s): PROBNP in the last 8760 hours. HbA1C: No results for input(s): HGBA1C in the last 72 hours. CBG: No results for input(s): GLUCAP in the last 168 hours. Lipid Profile: No results for input(s): CHOL, HDL, LDLCALC, TRIG, CHOLHDL, LDLDIRECT in the last 72 hours. Thyroid Function Tests: No results for input(s): TSH, T4TOTAL, FREET4, T3FREE, THYROIDAB in the last 72 hours. Anemia Panel: No results for input(s): VITAMINB12, FOLATE, FERRITIN, TIBC, IRON, RETICCTPCT in the last 72 hours. Urine analysis:    Component Value Date/Time   COLORURINE YELLOW 09/09/2020 1110   APPEARANCEUR CLEAR 09/09/2020 1110   LABSPEC 1.020 09/09/2020 1110   PHURINE 7.0 09/09/2020 1110   GLUCOSEU NEGATIVE 09/09/2020 1110   HGBUR MODERATE (A) 09/09/2020 1110   BILIRUBINUR NEGATIVE 09/09/2020 1110   KETONESUR 5 (A) 09/09/2020 1110   PROTEINUR 30 (A) 09/09/2020 1110   UROBILINOGEN 0.2 06/26/2010 1624   NITRITE NEGATIVE 09/09/2020 1110   LEUKOCYTESUR NEGATIVE 09/09/2020 1110    Recent Results (from the past 240 hour(s))  Blood Culture (routine x 2)     Status: None (Preliminary result)   Collection Time: 09/09/20  9:45 AM   Specimen: Right Antecubital; Blood  Result Value Ref Range Status   Specimen Description   Final    RIGHT ANTECUBITAL BOTTLES DRAWN AEROBIC AND ANAEROBIC   Special Requests   Final    Blood Culture results may not be optimal due to an inadequate volume of blood received in culture bottles Performed at [facility], [address], [place], [place] [address]    Culture PENDING  Incomplete   Report Status PENDING  Incomplete  Blood Culture (routine x 2)     Status: None  (Preliminary result)   Collection Time: 09/09/20  9:56 AM   Specimen: BLOOD RIGHT FOREARM  Result Value Ref Range Status   Specimen Description   Final    BLOOD RIGHT FOREARM BOTTLES DRAWN AEROBIC AND ANAEROBIC   Special Requests   Final    Blood Culture adequate volume Performed at [facility]  [facility], [address] [place], [place] [address]    Culture PENDING  Incomplete   Report Status PENDING  Incomplete  Resp Panel by RT-PCR (Flu A&B, Covid) Nasopharyngeal Swab     Status: None   Collection Time: 09/09/20 10:08 AM   Specimen: Nasopharyngeal Swab; Nasopharyngeal(NP) swabs in vial transport medium  Result Value Ref Range Status   SARS Coronavirus 2 by RT PCR NEGATIVE NEGATIVE Final    Comment: (NOTE) SARS-CoV-2 target nucleic acids are NOT DETECTED.  The SARS-CoV-2 RNA is generally detectable in upper respiratory specimens during the acute phase of infection. The lowest concentration of SARS-CoV-2 viral copies this assay can detect is 138 copies/mL. A negative result does not preclude SARS-Cov-2 infection and should not be used as the sole basis for treatment or other patient management decisions. A negative result may occur with  improper specimen collection/handling, submission of specimen other than nasopharyngeal swab, presence of viral mutation(s) within the areas targeted by this assay, and inadequate number of viral copies(<138 copies/mL). A negative result must be combined with clinical observations, patient history, and epidemiological information. The expected result is Negative.  Fact Sheet for Patients:  EntrepreneurPulse.com.au  Fact Sheet for Healthcare Providers:  IncredibleEmployment.be  This test is no t yet approved or cleared by the [place] FDA and  has been authorized for detection and/or diagnosis of SARS-CoV-2 by FDA under an Emergency Use Authorization (EUA). This EUA will remain  in effect (meaning this test can  be used) for the duration of the COVID-19 declaration under Section 564(b)(1) of the Act, 21 U.S.C.section 360bbb-3(b)(1), unless the authorization is terminated  or revoked sooner.       Influenza A by PCR NEGATIVE NEGATIVE Final   Influenza B by PCR NEGATIVE NEGATIVE Final    Comment: (NOTE) The Xpert Xpress SARS-CoV-2/FLU/RSV plus assay is intended as an aid in the diagnosis of influenza from Nasopharyngeal swab specimens and should not be used as a sole basis for treatment. Nasal washings and aspirates are unacceptable for Xpert Xpress SARS-CoV-2/FLU/RSV testing.  Fact Sheet for Patients: EntrepreneurPulse.com.au  Fact Sheet for Healthcare Providers: IncredibleEmployment.be  This test is not yet approved or cleared by the [place] FDA and has been authorized for detection and/or diagnosis of SARS-CoV-2 by FDA under an Emergency Use Authorization (EUA). This EUA will remain in effect (meaning this test can be used) for the duration of the COVID-19 declaration under Section 564(b)(1) of the Act, 21 U.S.C. section 360bbb-3(b)(1), unless the authorization is terminated or revoked.  Performed at [facility], [address] [place], [place] [address]      Radiological Exams on Admission: CT Head Wo Contrast  Result Date: 09/09/2020 CLINICAL DATA:  Syncope and fall. EXAM: CT HEAD WITHOUT CONTRAST TECHNIQUE: Contiguous axial images were obtained from the base of the skull through the vertex without intravenous contrast. COMPARISON:  CT head dated April 21, 2008. FINDINGS: Brain: No evidence of acute infarction, hemorrhage, hydrocephalus, extra-axial collection or mass lesion/mass effect. Chronic lacunar infarct in the left subinsular white matter again noted. Vascular: No hyperdense vessel or unexpected calcification. Skull: Normal. Negative for fracture or focal lesion. Sinuses/Orbits: No acute finding. Other: None. IMPRESSION: 1. No acute  intracranial abnormality. Electronically Signed   By: Titus Dubin M.D.   On: 09/09/2020 11:41   DG Pelvis Portable  Result Date: 09/09/2020 CLINICAL DATA:  70 year old male with possible sepsis. Fall. EXAM: PORTABLE PELVIS 1-2 VIEWS COMPARISON:  CT Abdomen and Pelvis 06/26/2010. FINDINGS: Portable AP supine  view at 1010 hours. Bone mineralization is within normal limits. Femoral heads are normally located. Proximal femurs appear stable since 2012 in grossly intact. There is chronic asymmetric right hip joint space loss and spurring. No pelvis fracture identified. SI joints and pubic symphysis appear normal. Negative visible bowel gas, lower abdominal visceral contours. IMPRESSION: No acute fracture or dislocation identified about the pelvis. Chronic right hip osteoarthritis. Electronically Signed   By: Genevie Ann M.D.   On: 09/09/2020 10:44   DG Chest Port 1 View  Result Date: 09/09/2020 CLINICAL DATA:  70 year old male with possible sepsis. Fall. Former smoker. EXAM: PORTABLE CHEST 1 VIEW COMPARISON:  Chest radiograph 11/15/2009. FINDINGS: Portable AP upright view at 1007 hours. Stable lung volumes. Mediastinal contours remain normal. Coarse bilateral pulmonary interstitial opacity is new or increased since 2011, and asymmetrically greater on the right. No definite pleural effusion. No pneumothorax or consolidation. Negative visible bowel gas in the upper abdomen. No acute osseous abnormality identified. IMPRESSION: Coarse bilateral pulmonary interstitial opacity is new since 2011, greater in the right lung, and nonspecific. This could be chronic interstitial lung disease, but acute viral/atypical respiratory infection, or less likely asymmetric edema, are not excluded. Electronically Signed   By: Genevie Ann M.D.   On: 09/09/2020 10:43    EKG: Independently reviewed. NSR, no ischemic changes or suggestion of LVH.  Assessment/Plan Principal Problem:   Sepsis due to pneumonia [facility]) Active Problems:    Depression   Anxiety   Hyperlipidemia   Arthritis   Sarcoidosis   Sepsis due to pneumonia with lactic acid elevation: CXR with diffuse interstitial opacities that could represent ILD (has hx sarcoidosis), but with presentation more likely reflects acute changes due to viral/atypical organisms.   - Continue antibiotics, will narrow to CTX, azithromycin now that etiology is more differentiated - Monitor blood cultures. Check PCT, trend lactic acid. - Given 30cc/kg IVF, will push po intake from here. - With lymphopenia, atypical radiographic appearance, will check full viral respiratory panel. Covid and flu negative.  - Recommend follow up CXR after resolution. Note history of TB (treated x1 year) without granuloma on CXR.   Troponin elevation: Mild with normal resting ECG. Pt does report some chest pains intermittently.  - Recommend outpatient risk stratification - Continue home aspirin.   Anxiety, depression:  - Continue home medications  Arthritis: Followed by Dr. Aline Brochure.  - Continue home medications.   Falls at home, leg pain: P[place]vis XR, CT head without acute changes. - Suggest ABI as outpatient.  - PT evaluation requested  LFT elevations: Could be consistent with viral illness.  - Monitor in AM and consider work up if not improving.  - Will hold statin for today.  HLD: - Hold statin as above.   DVT prophylaxis: Lovenox  Code Status: Full  Family Communication: Wife at bedside Disposition Plan: Home Consults called: None  Admission status: Observation    Patrecia Pour, MD [facility] [email or web address] 09/09/2020, 3:25 PM

## 2020-09-09 NOTE — ED Notes (Signed)
Report given to [employer] on 300

## 2020-09-09 NOTE — ED Triage Notes (Signed)
Per EMS called out for sick call upon arrival, per wife pt fell hit head on the floor and loss consciousness, has been having chills and fever, decreased appetite over last 2 weeks, pt alert and oriented x4.

## 2020-09-10 DIAGNOSIS — A419 Sepsis, unspecified organism: Secondary | ICD-10-CM | POA: Diagnosis not present

## 2020-09-10 DIAGNOSIS — M1990 Unspecified osteoarthritis, unspecified site: Secondary | ICD-10-CM | POA: Diagnosis not present

## 2020-09-10 DIAGNOSIS — E785 Hyperlipidemia, unspecified: Secondary | ICD-10-CM | POA: Diagnosis not present

## 2020-09-10 DIAGNOSIS — J189 Pneumonia, unspecified organism: Secondary | ICD-10-CM | POA: Diagnosis not present

## 2020-09-10 LAB — CBC WITH DIFFERENTIAL/PLATELET
Abs Immature Granulocytes: 0 10*3/uL (ref 0.00–0.07)
Basophils Absolute: 0 10*3/uL (ref 0.0–0.1)
Basophils Relative: 1 %
Eosinophils Absolute: 0 10*3/uL (ref 0.0–0.5)
Eosinophils Relative: 0 %
HCT: 31.8 % — ABNORMAL LOW (ref 39.0–52.0)
Hemoglobin: 9.8 g/dL — ABNORMAL LOW (ref 13.0–17.0)
Immature Granulocytes: 0 %
Lymphocytes Relative: 22 %
Lymphs Abs: 0.4 10*3/uL — ABNORMAL LOW (ref 0.7–4.0)
MCH: 24.6 pg — ABNORMAL LOW (ref 26.0–34.0)
MCHC: 30.8 g/dL (ref 30.0–36.0)
MCV: 79.9 fL — ABNORMAL LOW (ref 80.0–100.0)
Monocytes Absolute: 0.2 10*3/uL (ref 0.1–1.0)
Monocytes Relative: 11 %
Neutro Abs: 1.2 10*3/uL — ABNORMAL LOW (ref 1.7–7.7)
Neutrophils Relative %: 66 %
Platelets: 102 10*3/uL — ABNORMAL LOW (ref 150–400)
RBC: 3.98 MIL/uL — ABNORMAL LOW (ref 4.22–5.81)
RDW: 15.1 % (ref 11.5–15.5)
WBC: 1.8 10*3/uL — ABNORMAL LOW (ref 4.0–10.5)
nRBC: 0 % (ref 0.0–0.2)

## 2020-09-10 LAB — PROCALCITONIN: Procalcitonin: 0.78 ng/mL

## 2020-09-10 LAB — COMPREHENSIVE METABOLIC PANEL
ALT: 45 U/L — ABNORMAL HIGH (ref 0–44)
AST: 76 U/L — ABNORMAL HIGH (ref 15–41)
Albumin: 2.8 g/dL — ABNORMAL LOW (ref 3.5–5.0)
Alkaline Phosphatase: 109 U/L (ref 38–126)
Anion gap: 8 (ref 5–15)
BUN: 14 mg/dL (ref 8–23)
CO2: 22 mmol/L (ref 22–32)
Calcium: 7.8 mg/dL — ABNORMAL LOW (ref 8.9–10.3)
Chloride: 102 mmol/L (ref 98–111)
Creatinine, Ser: 0.99 mg/dL (ref 0.61–1.24)
GFR, Estimated: >60 mL/min (ref >60)
Glucose, Bld: 85 mg/dL (ref 70–99)
Potassium: 3.4 mmol/L — ABNORMAL LOW (ref 3.5–5.1)
Sodium: 132 mmol/L — ABNORMAL LOW (ref 135–145)
Total Bilirubin: 0.7 mg/dL (ref 0.3–1.2)
Total Protein: 5.2 g/dL — ABNORMAL LOW (ref 6.5–8.1)

## 2020-09-10 LAB — LACTIC ACID, PLASMA: Lactic Acid, Venous: 1 mmol/L (ref 0.5–1.9)

## 2020-09-10 MED ORDER — PANTOPRAZOLE SODIUM 40 MG PO TBEC: 40.0000 mg | DELAYED_RELEASE_TABLET | Freq: Every day | ORAL | 1 refills | Status: DC

## 2020-09-10 MED ORDER — AZITHROMYCIN 500 MG PO TABS: 500.0000 mg | ORAL_TABLET | Freq: Every day | ORAL | 0 refills | Status: DC

## 2020-09-10 MED ORDER — CEFDINIR 300 MG PO CAPS: 600.0000 mg | ORAL_CAPSULE | Freq: Every day | ORAL | 0 refills | Status: AC

## 2020-09-10 MED ORDER — POLYETHYLENE GLYCOL 3350 17 G PO PACK
17.0000 g | PACK | Freq: Every day | ORAL | 0 refills | Status: AC | PRN
Start: 2020-09-10 — End: ?

## 2020-09-10 MED ORDER — DICLOFENAC POTASSIUM 50 MG PO TABS: 50.0000 mg | ORAL_TABLET | Freq: Two times a day (BID) | ORAL | 3 refills | Status: DC | PRN

## 2020-09-10 MED ORDER — ACETAMINOPHEN 325 MG PO TABS: 650.0000 mg | ORAL_TABLET | Freq: Four times a day (QID) | ORAL | Status: DC | PRN

## 2020-09-10 MED ORDER — HYDROCODONE-ACETAMINOPHEN 7.5-325 MG PO TABS: 1.0000 | ORAL_TABLET | Freq: Two times a day (BID) | ORAL | 0 refills | Status: AC | PRN

## 2020-09-10 MED ORDER — DULOXETINE HCL 60 MG PO CPEP
60.0000 mg | ORAL_CAPSULE | Freq: Every day | ORAL | Status: DC
Start: 2020-09-10 — End: 2021-10-04

## 2020-09-10 MED ORDER — POTASSIUM CHLORIDE CRYS ER 20 MEQ PO TBCR
40.0000 meq | EXTENDED_RELEASE_TABLET | Freq: Once | ORAL | Status: AC
  Administered 2020-09-10: 40 meq via ORAL
  Filled 2020-09-10: qty 2

## 2020-09-10 MED ORDER — ALPRAZOLAM 0.5 MG PO TABS: 0.5000 mg | ORAL_TABLET | Freq: Every day | ORAL | 0 refills | Status: DC | PRN

## 2020-09-10 MED ORDER — ACETAMINOPHEN 325 MG PO TABS
650.0000 mg | ORAL_TABLET | Freq: Four times a day (QID) | ORAL | Status: DC | PRN
  Administered 2020-09-10: 650 mg via ORAL
  Filled 2020-09-10: qty 2

## 2020-09-10 NOTE — Progress Notes (Signed)
DOB: January 06, 2051 Date: 09/10/20   Must ID: [id number]   To Whom it May Concern:  Please be advised that the above named patient will require a short-term nursing home stay- anticipated 30 days or less rehabilitation and strengthening. The plan is for return home.

## 2020-09-10 NOTE — Progress Notes (Signed)
Patient wife, Albert Gonzalez, requested to speak with nursing staff due to her feeling unsafe taking him home without seeing him walk. When talking to wife, it was explained that there are no beds available per case management note. Wife, Albert Gonzalez, was most upset about patient pulling IV out and that his heart monitor was not in his pocket. Once primary nurse explained those events and what had occurred wife was satisfied. Wife states that she, herself, is a Public house manager and has family at home that can help her. She states that they have rails near their stairs that patient can use to ambulate as well as a walker. Offered to have social work/case management give her a call however she declined and stated that she understood everything.

## 2020-09-10 NOTE — Progress Notes (Signed)
Pt pulled out IV and removed his tele.

## 2020-09-10 NOTE — Progress Notes (Signed)
Nsg Discharge Note  Admit Date:  09/09/2020 Discharge date: 09/10/2020   [name] to be D/C'd Home per MD order.  AVS completed.  Copy for chart, and copy for patient signed, and dated. Patient/caregiver able to verbalize understanding.  Discharge Medication: Allergies as of 09/10/2020       Reactions   Sulfa Antibiotics Rash   Childhood         Medication List     STOP taking these medications    diclofenac 75 MG EC tablet Commonly known as: VOLTAREN   ibuprofen 200 MG tablet Commonly known as: ADVIL   oxyCODONE 5 MG immediate release tablet Commonly known as: Oxy IR/ROXICODONE   ramelteon 8 MG tablet Commonly known as: ROZEREM       TAKE these medications    acetaminophen 325 MG tablet Commonly known as: TYLENOL Take 2 tablets (650 mg total) by mouth every 6 (six) hours as needed for fever, mild pain or headache.   ALPRAZolam 0.5 MG tablet Commonly known as: XANAX Take 1 tablet (0.5 mg total) by mouth daily as needed for anxiety.   aspirin 325 MG tablet Take 325 mg by mouth daily.   atorvastatin 40 MG tablet Commonly known as: LIPITOR Take 40 mg daily at 6 PM by mouth.   azithromycin 500 MG tablet Commonly known as: ZITHROMAX Take 1 tablet (500 mg total) by mouth daily. Start taking on: September 11, 2020   CALCIUM + D PO Take 2 tablets by mouth daily.   cefdinir 300 MG capsule Commonly known as: OMNICEF Take 2 capsules (600 mg total) by mouth daily for 5 days. Start taking on: September 11, 2020   diclofenac 50 MG tablet Commonly known as: CATAFLAM Take 1 tablet (50 mg total) by mouth 2 (two) times daily as needed (arthritis pain). What changed:  when to take this reasons to take this   DULoxetine 60 MG capsule Commonly known as: CYMBALTA Take 1 capsule (60 mg total) by mouth daily. Total of 90 mg daily (60 mg + 30 mg) What changed:  medication strength how much to take how to take this when to take this Another medication with the same  name was removed. Continue taking this medication, and follow the directions you see here.   EQ VISION FORMULA 50+ PO Take 1 tablet by mouth daily.   GLUCOSAMINE CHOND DOUBLE STR PO Take 2 tablets by mouth daily.   HYDROcodone-acetaminophen 7.5-325 MG tablet Commonly known as: NORCO Take 1 tablet by mouth every 12 (twelve) hours as needed for up to 3 days for severe pain. What changed:  when to take this reasons to take this   multivitamin tablet Take 1 tablet by mouth daily. Men's 50+   pantoprazole 40 MG tablet Commonly known as: Protonix Take 1 tablet (40 mg total) by mouth daily.   polyethylene glycol 17 g packet Commonly known as: MIRALAX / GLYCOLAX Take 17 g by mouth daily as needed for mild constipation.   QUEtiapine 25 MG tablet Commonly known as: SEROQUEL Take 25 mg by mouth at bedtime.   tiZANidine 4 MG tablet Commonly known as: ZANAFLEX Take 4 mg daily as needed by mouth for muscle spasms.   traZODone 50 MG tablet Commonly known as: DESYREL Take 50 mg by mouth at bedtime.        Discharge Assessment: Vitals:   09/10/20 1200 09/10/20 1335  BP:  (!) 92/55  Pulse:  74  Resp:  18  Temp: 98.8 F (37.1 C)  98.7 F (37.1 C)  SpO2:  99%   Skin clean, dry and intact without evidence of skin break down, no evidence of skin tears noted. IV catheter discontinued intact. Site without signs and symptoms of complications - no redness or edema noted at insertion site, patient denies c/o pain - only slight tenderness at site.  Dressing with slight pressure applied.  D/c Instructions-Education: Discharge instructions given to patient and wife Lorre Nick with verbalized understanding. D/c education completed with patient/family including follow up instructions, medication list, d/c activities limitations if indicated, with other d/c instructions as indicated by MD - patient able to verbalize understanding, all questions fully answered. Patient instructed to return to ED,  call 911, or call MD for any changes in condition.  Patient escorted via [place], and D/C home via private auto.  Clovis Fredrickson, LPN 10/31/147 9:69 PM

## 2020-09-10 NOTE — Progress Notes (Signed)
   09/10/20 1000  Vitals  Temp (!) 100.5 F (38.1 C)  BP 92/70  MAP (mmHg) 74  BP Location Right Arm  BP Method Automatic  Pulse Rate 88  Resp 20  Level of Consciousness  Level of Consciousness Alert  MEWS COLOR  MEWS Score Color Yellow  Oxygen Therapy  SpO2 97 %  O2 Device Room Air  Pain Assessment  Pain Scale 0-10  Pain Score 0  Pain Type Acute pain  MEWS Score  MEWS Temp 1  MEWS Systolic 1  MEWS Pulse 0  MEWS RR 0  MEWS LOC 0  MEWS Score 2  Provider Notification  Provider Name/Title Murlean Iba, MD  Date Provider Notified 09/10/20  Time Provider Notified 1058  Notification Type Call (Chat)  Notification Reason Other (Comment) (Yellow MEWS)  Provider response No new orders

## 2020-09-10 NOTE — TOC Progression Note (Signed)
Transition of Care [facility]) - Progression Note    Patient Details  Name: Albert Gonzalez MRN: [id number] Date of Birth: 1950-03-06  Transition of Care [facility]) CM/SW Contact  Joaquin Courts, RN Phone Number: 09/10/2020, 2:18 PM  Clinical Narrative:    CM reached out to pending snf facilities to follow up regarding referral, at this time no snf bed is available for patient, CMS 3-day waiver expires today.  Patient updated regarding this information and elects to return home with [facility] services.  MD notified and orders requested.  Referral given to [facility] rep.    Expected Discharge Plan: [place] Barriers to Discharge: No SNF bed  Expected Discharge Plan and Services Expected Discharge Plan: [place] In-house Referral: Clinical Social Work   Post Acute Care Choice: [place] Living arrangements for the past 2 months: Single Family Home Expected Discharge Date: 09/10/20               DME Arranged: N/A                     Social Determinants of Health (SDOH) Interventions    Readmission Risk Interventions No flowsheet data found.

## 2020-09-10 NOTE — Evaluation (Signed)
Physical Therapy Evaluation Patient Details Name: Albert Gonzalez MRN: [id number] DOB: 10-Oct-1950 Today's Date: 09/10/2020   History of Present Illness  Albert Gonzalez is a 70 y.o. male with a history of anxiety, depression, HLD, and sarcoidosis not requiring treatment who presented to the ED by EMS on the insistence of his wife for 2-3 days of worsening fevers, fatigue, poor per oral intake. He reports feeling progressively more ill with fevers to 103F with nonproductive cough, poor per oral intake, diffuse weakness, and ultimately consented to his wife calling EMS. When they arrived he felt weak and fell hitting his head.   Clinical Impression  Patient limited for functional mobility as stated below secondary to BLE weakness, fatigue and poor standing balance. Patient completes bed mobility very slowly with labored movements in order to transition to seated EOB. He demonstrates good sitting balance and sitting tolerance. Patient with difficulty donning/doffing socks, boxers secondary to impaired dynamic sitting balance, fatigue, and impaired activity tolerance requiring assist. Patient requires min/mod assist to transfer to standing due to bilateral LE weakness and impaired balance. Patient ambulates in room with slightly unsteady cadence with use of RW. Patient appears to be at a high risk for falling at this time due to weakness/balance deficits. Patient will benefit from continued physical therapy in hospital and recommended venue below to increase strength, balance, endurance for safe ADLs and gait.     Follow Up Recommendations SNF    Equipment Recommendations  None recommended by PT    Recommendations for Other Services       Precautions / Restrictions Precautions Precautions: Fall Restrictions Weight Bearing Restrictions: No      Mobility  Bed Mobility Overal bed mobility: Modified Independent             General bed mobility comments: very slow, labored transition to  seated EOB    Transfers Overall transfer level: Needs assistance Equipment used: Rolling walker (2 wheeled) Transfers: Sit to/from [facility] Sit to Stand: Min assist;Mod assist Stand pivot transfers: Min assist       General transfer comment: labored transition to standing with RW requiring assist for weakness  Ambulation/Gait Ambulation/Gait assistance: Min assist Gait Distance (Feet): 30 Feet Assistive device: Rolling walker (2 wheeled) Gait Pattern/deviations: Step-through pattern;Decreased stride length Gait velocity: decreased   General Gait Details: slow, labored, slightly unsteady cadence with RW  Stairs            Wheelchair Mobility    Modified Rankin (Stroke Patients Only)       Balance Overall balance assessment: Needs assistance Sitting-balance support: Feet supported Sitting balance-Leahy Scale: Good Sitting balance - Comments: seated EOB   Standing balance support: Bilateral upper extremity supported Standing balance-Leahy Scale: Fair Standing balance comment: with RW                             Pertinent Vitals/Pain Pain Assessment: No/denies pain    Home Living Family/patient expects to be discharged to:: Private residence Living Arrangements: Spouse/significant other Available Help at Discharge: Family (minimal/ no assist able to be provided from wife) Type of Home: House (split level home) Home Access: Stairs to enter Entrance Stairs-Rails: Can reach both;Right;Left Entrance Stairs-Number of Steps: 8-10 Home Layout: Multi-level Home Equipment: Walker - 2 wheels;Cane - single point;Bedside commode;Shower seat;Grab bars - toilet;Grab bars - tub/shower      Prior Function Level of Independence: Independent  Comments: Patient states short distance community ambulator without AD, independent with basic ADL     Hand Dominance        Extremity/Trunk Assessment   Upper Extremity  Assessment Upper Extremity Assessment: Generalized weakness    Lower Extremity Assessment Lower Extremity Assessment: Generalized weakness    Cervical / Trunk Assessment Cervical / Trunk Assessment: Normal  Communication   Communication: No difficulties  Cognition Arousal/Alertness: Awake/alert Behavior During Therapy: WFL for tasks assessed/performed Overall Cognitive Status: Within Functional Limits for tasks assessed                                        General Comments      Exercises     Assessment/Plan    PT Assessment Patient needs continued PT services  PT Problem List Decreased strength;Decreased mobility;Decreased activity tolerance;Decreased balance       PT Treatment Interventions DME instruction;Therapeutic exercise;Gait training;Balance training;Stair training;Neuromuscular re-education;Functional mobility training;Therapeutic activities;Patient/family education    PT Goals (Current goals can be found in the Care Plan section)  Acute Rehab PT Goals Patient Stated Goal: Return home PT Goal Formulation: With patient Time For Goal Achievement: 09/24/20 Potential to Achieve Goals: Good    Frequency Min 3X/week   Barriers to discharge        Co-evaluation               AM-PAC PT "6 Clicks" Mobility  Outcome Measure Help needed turning from your back to your side while in a flat bed without using bedrails?: None Help needed moving from lying on your back to sitting on the side of a flat bed without using bedrails?: A Little Help needed moving to and from a bed to a chair (including a wheelchair)?: A Little Help needed standing up from a chair using your arms (e.g., wheelchair or bedside chair)?: A Little Help needed to walk in hospital room?: A Little Help needed climbing 3-5 steps with a railing? : A Lot 6 Click Score: 18    End of Session Equipment Utilized During Treatment: Gait belt Activity Tolerance: Patient tolerated  treatment well Patient left: in bed;with call bell/phone within reach Nurse Communication: Mobility status PT Visit Diagnosis: Unsteadiness on feet (R26.81);Other abnormalities of gait and mobility (R26.89);Muscle weakness (generalized) (M62.81);History of falling (Z91.81)    Time: 2355-7322 PT Time Calculation (min) (ACUTE ONLY): 22 min   Charges:   PT Evaluation $PT Eval Low Complexity: 1 Low PT Treatments $Therapeutic Activity: 8-22 mins        9:27 AM, 09/10/20 Mearl Latin PT, DPT Physical Therapist at [facility]

## 2020-09-10 NOTE — Plan of Care (Signed)
  Problem: Acute Rehab PT Goals(only PT should resolve) Goal: Patient Will Transfer Sit To/From Stand Outcome: Progressing Flowsheets (Taken 09/10/2020 0929) Patient will transfer sit to/from stand: with min guard assist Goal: Pt Will Transfer Bed To Chair/Chair To Bed Outcome: Progressing Flowsheets (Taken 09/10/2020 0929) Pt will Transfer Bed to Chair/Chair to Bed: min guard assist Goal: Pt Will Ambulate Outcome: Progressing Flowsheets (Taken 09/10/2020 0929) Pt will Ambulate:  75 feet  with min guard assist  with least restrictive assistive device Goal: Pt/caregiver will Perform Home Exercise Program Outcome: Progressing Flowsheets (Taken 09/10/2020 0929) Pt/caregiver will Perform Home Exercise Program:  For increased strengthening  For improved balance  Independently  9:30 AM, 09/10/20 Mearl Latin PT, DPT Physical Therapist at [facility]

## 2020-09-10 NOTE — Discharge Summary (Addendum)
Physician Discharge Summary  Albert Gonzalez:[id number] DOB: 1951-02-04 DOA: 09/09/2020  PCP: Emelia Loron, NP  Admit date: 09/09/2020 Discharge date: 09/10/2020  Admitted From:  Home  Disposition: SNF   Recommendations for Outpatient Follow-up:  Follow up with PCP in 2 weeks Please repeat CXR to ensure full resolution of pneumonia in 6 weeks Please obtain CMP/CBC in 1-2 weeks to follow up LFTs   Discharge Condition: STABLE   CODE STATUS: FULL DIET: heart healthy    Brief Hospitalization Summary: Please see all hospital notes, images, labs for full details of the hospitalization. ADMISSION HPI:  Albert Gonzalez is a 70 y.o. male with a history of anxiety, depression, HLD, and sarcoidosis not requiring treatment who presented to the ED by EMS on the insistence of his wife for 2-3 days of worsening fevers, fatigue, poor per oral intake. He reports feeling progressively more ill with fevers to 103F with nonproductive cough, poor per oral intake, diffuse weakness, and ultimately consented to his wife calling EMS. When they arrived he felt weak and fell hitting his head.    ED Course: He was hypotensive and normothermic. BP improved with IV fluids. Labs revealed hyponatremia, hypochloremia, lymphopenia, ketonuria without glucosuria, and CXR demonstrated diffuse bilateral interstitial infiltrates. ECG w/NSR and troponins mildly elevated at 22 > 19. Initial lactic acid normal but follow up elevated to 3.2. Broad antibiotics given after cultures drawn, 30cc/kg IVF given, admission requested.   HOSPITAL COURSE BY PROBLEM   Sepsis due to pneumonia with lactic acid elevation: RESOLVED.  Sepsis physicology and lactic acidosis has resolved.  CXR with diffuse interstitial opacities that could represent ILD (has hx sarcoidosis), but with presentation more likely reflects acute changes due to viral/atypical organisms.   - Continue antibiotics, He was treated with ceftriaxone and azithromycin - He  responded well to treatments.  His procalcitonin was mildly elevated >0.5 and agree with antibiotic use.  - Monitor blood cultures. Lactic acidosis - Lactate down to 1.0 after hydration and supportive care.  - Given 30cc/kg IVF.  - With lymphopenia, atypical radiographic appearance, will check full viral respiratory panel. Covid and flu negative. - Recommend follow up CXR after resolution. Note history of TB (treated x1 year) without granuloma on CXR.   Troponin elevation: Mild with normal resting ECG. Pt does report some chest pains intermittently. - Recommend outpatient risk stratification - Continue home aspirin.   Anxiety, depression: - Continue home medications   Arthritis: Followed by Dr. Aline Brochure. - Continue home medications.   Falls at home, leg pain: PElvis XR, CT head without acute changes. - Suggest ABI as outpatient. - PT evaluation requested, recommending SNF   LFT elevations: Could be consistent with viral illness. - recheck outpatient with PCP.   HLD: -resume home statin at discharge     DVT prophylaxis: Lovenox  Code Status: Full  Family Communication: Wife at bedside Disposition Plan: Home Consults called: None  Admission status: Observation   Discharge Diagnoses:  Principal Problem:   Sepsis due to pneumonia [facility]) Active Problems:   Depression   Anxiety   Hyperlipidemia   Arthritis   Sarcoidosis   Discharge Instructions:  Allergies as of 09/10/2020       Reactions   Sulfa Antibiotics Rash   Childhood         Medication List     STOP taking these medications    diclofenac 75 MG EC tablet Commonly known as: VOLTAREN   ibuprofen 200 MG tablet Commonly known as: ADVIL  oxyCODONE 5 MG immediate release tablet Commonly known as: Oxy IR/ROXICODONE   ramelteon 8 MG tablet Commonly known as: ROZEREM       TAKE these medications    acetaminophen 325 MG tablet Commonly known as: TYLENOL Take 2 tablets (650 mg total) by mouth every  6 (six) hours as needed for fever, mild pain or headache.   ALPRAZolam 0.5 MG tablet Commonly known as: XANAX Take 1 tablet (0.5 mg total) by mouth daily as needed for anxiety.   aspirin 325 MG tablet Take 325 mg by mouth daily.   atorvastatin 40 MG tablet Commonly known as: LIPITOR Take 40 mg daily at 6 PM by mouth.   azithromycin 500 MG tablet Commonly known as: ZITHROMAX Take 1 tablet (500 mg total) by mouth daily. Start taking on: September 11, 2020   CALCIUM + D PO Take 2 tablets by mouth daily.   cefdinir 300 MG capsule Commonly known as: OMNICEF Take 2 capsules (600 mg total) by mouth daily for 5 days. Start taking on: September 11, 2020   diclofenac 50 MG tablet Commonly known as: CATAFLAM Take 1 tablet (50 mg total) by mouth 2 (two) times daily as needed (arthritis pain). What changed:  when to take this reasons to take this   DULoxetine 60 MG capsule Commonly known as: CYMBALTA Take 1 capsule (60 mg total) by mouth daily. Total of 90 mg daily (60 mg + 30 mg) What changed:  medication strength how much to take how to take this when to take this Another medication with the same name was removed. Continue taking this medication, and follow the directions you see here.   EQ VISION FORMULA 50+ PO Take 1 tablet by mouth daily.   GLUCOSAMINE CHOND DOUBLE STR PO Take 2 tablets by mouth daily.   HYDROcodone-acetaminophen 7.5-325 MG tablet Commonly known as: NORCO Take 1 tablet by mouth every 12 (twelve) hours as needed for up to 3 days for severe pain. What changed:  when to take this reasons to take this   multivitamin tablet Take 1 tablet by mouth daily. Men's 50+   pantoprazole 40 MG tablet Commonly known as: Protonix Take 1 tablet (40 mg total) by mouth daily.   polyethylene glycol 17 g packet Commonly known as: MIRALAX / GLYCOLAX Take 17 g by mouth daily as needed for mild constipation.   QUEtiapine 25 MG tablet Commonly known as: SEROQUEL Take 25 mg  by mouth at bedtime.   tiZANidine 4 MG tablet Commonly known as: ZANAFLEX Take 4 mg daily as needed by mouth for muscle spasms.   traZODone 50 MG tablet Commonly known as: DESYREL Take 50 mg by mouth at bedtime.        Follow-up Information     Emelia Loron, NP. Schedule an appointment as soon as possible for a visit in 2 week(s).   Specialty: Nurse Practitioner Why: Hospital Follow Up Contact information: [place] [address] [phone]                Allergies  Allergen Reactions   Sulfa Antibiotics Rash    Childhood    Allergies as of 09/10/2020       Reactions   Sulfa Antibiotics Rash   Childhood         Medication List     STOP taking these medications    diclofenac 75 MG EC tablet Commonly known as: VOLTAREN   ibuprofen 200 MG tablet Commonly known as: ADVIL   oxyCODONE  5 MG immediate release tablet Commonly known as: Oxy IR/ROXICODONE   ramelteon 8 MG tablet Commonly known as: ROZEREM       TAKE these medications    acetaminophen 325 MG tablet Commonly known as: TYLENOL Take 2 tablets (650 mg total) by mouth every 6 (six) hours as needed for fever, mild pain or headache.   ALPRAZolam 0.5 MG tablet Commonly known as: XANAX Take 1 tablet (0.5 mg total) by mouth daily as needed for anxiety.   aspirin 325 MG tablet Take 325 mg by mouth daily.   atorvastatin 40 MG tablet Commonly known as: LIPITOR Take 40 mg daily at 6 PM by mouth.   azithromycin 500 MG tablet Commonly known as: ZITHROMAX Take 1 tablet (500 mg total) by mouth daily. Start taking on: September 11, 2020   CALCIUM + D PO Take 2 tablets by mouth daily.   cefdinir 300 MG capsule Commonly known as: OMNICEF Take 2 capsules (600 mg total) by mouth daily for 5 days. Start taking on: September 11, 2020   diclofenac 50 MG tablet Commonly known as: CATAFLAM Take 1 tablet (50 mg total) by mouth 2 (two) times daily as needed (arthritis pain). What  changed:  when to take this reasons to take this   DULoxetine 60 MG capsule Commonly known as: CYMBALTA Take 1 capsule (60 mg total) by mouth daily. Total of 90 mg daily (60 mg + 30 mg) What changed:  medication strength how much to take how to take this when to take this Another medication with the same name was removed. Continue taking this medication, and follow the directions you see here.   EQ VISION FORMULA 50+ PO Take 1 tablet by mouth daily.   GLUCOSAMINE CHOND DOUBLE STR PO Take 2 tablets by mouth daily.   HYDROcodone-acetaminophen 7.5-325 MG tablet Commonly known as: NORCO Take 1 tablet by mouth every 12 (twelve) hours as needed for up to 3 days for severe pain. What changed:  when to take this reasons to take this   multivitamin tablet Take 1 tablet by mouth daily. Men's 50+   pantoprazole 40 MG tablet Commonly known as: Protonix Take 1 tablet (40 mg total) by mouth daily.   polyethylene glycol 17 g packet Commonly known as: MIRALAX / GLYCOLAX Take 17 g by mouth daily as needed for mild constipation.   QUEtiapine 25 MG tablet Commonly known as: SEROQUEL Take 25 mg by mouth at bedtime.   tiZANidine 4 MG tablet Commonly known as: ZANAFLEX Take 4 mg daily as needed by mouth for muscle spasms.   traZODone 50 MG tablet Commonly known as: DESYREL Take 50 mg by mouth at bedtime.        Procedures/Studies: CT Head Wo Contrast  Result Date: 09/09/2020 CLINICAL DATA:  Syncope and fall. EXAM: CT HEAD WITHOUT CONTRAST TECHNIQUE: Contiguous axial images were obtained from the base of the skull through the vertex without intravenous contrast. COMPARISON:  CT head dated April 21, 2008. FINDINGS: Brain: No evidence of acute infarction, hemorrhage, hydrocephalus, extra-axial collection or mass lesion/mass effect. Chronic lacunar infarct in the left subinsular white matter again noted. Vascular: No hyperdense vessel or unexpected calcification. Skull: Normal.  Negative for fracture or focal lesion. Sinuses/Orbits: No acute finding. Other: None. IMPRESSION: 1. No acute intracranial abnormality. Electronically Signed   By: Titus Dubin M.D.   On: 09/09/2020 11:41   DG Pelvis Portable  Result Date: 09/09/2020 CLINICAL DATA:  70 year old male with possible sepsis. Fall. EXAM: PORTABLE PELVIS  1-2 VIEWS COMPARISON:  CT Abdomen and Pelvis 06/26/2010. FINDINGS: Portable AP supine view at 1010 hours. Bone mineralization is within normal limits. Femoral heads are normally located. Proximal femurs appear stable since 2012 in grossly intact. There is chronic asymmetric right hip joint space loss and spurring. No pelvis fracture identified. SI joints and pubic symphysis appear normal. Negative visible bowel gas, lower abdominal visceral contours. IMPRESSION: No acute fracture or dislocation identified about the pelvis. Chronic right hip osteoarthritis. Electronically Signed   By: Genevie Ann M.D.   On: 09/09/2020 10:44   DG Chest Port 1 View  Result Date: 09/09/2020 CLINICAL DATA:  70 year old male with possible sepsis. Fall. Former smoker. EXAM: PORTABLE CHEST 1 VIEW COMPARISON:  Chest radiograph 11/15/2009. FINDINGS: Portable AP upright view at 1007 hours. Stable lung volumes. Mediastinal contours remain normal. Coarse bilateral pulmonary interstitial opacity is new or increased since 2011, and asymmetrically greater on the right. No definite pleural effusion. No pneumothorax or consolidation. Negative visible bowel gas in the upper abdomen. No acute osseous abnormality identified. IMPRESSION: Coarse bilateral pulmonary interstitial opacity is new since 2011, greater in the right lung, and nonspecific. This could be chronic interstitial lung disease, but acute viral/atypical respiratory infection, or less likely asymmetric edema, are not excluded. Electronically Signed   By: Genevie Ann M.D.   On: 09/09/2020 10:43     Subjective: Pt reports that he is feeling so much better,  he has dry cough, but no SOB, no CP, no palpitations.  He is ambulating with assistance only.  PT recommending SNF.  He is agreeable.   Discharge Exam: Vitals:   09/10/20 1000 09/10/20 1200  BP: 92/70   Pulse: 88   Resp: 20   Temp: (!) 100.5 F (38.1 C) 98.8 F (37.1 C)  SpO2: 97%    Vitals:   09/09/20 2340 09/10/20 0416 09/10/20 1000 09/10/20 1200  BP: 104/60 (!) 100/50 92/70   Pulse: 70 82 88   Resp: 18 19 20    Temp: 98.5 F (36.9 C) 98.2 F (36.8 C) (!) 100.5 F (38.1 C) 98.8 F (37.1 C)  TempSrc: Oral     SpO2: 100% 91% 97%   Weight:      Height:        General: Pt is alert, awake, not in acute distress Cardiovascular: RRR, S1/S2 +, no rubs, no gallops Respiratory: no increased work of breathing, fine rales heard RLL.  Abdominal: Soft, NT, ND, bowel sounds + Extremities: no edema, no cyanosis   The results of significant diagnostics from this hospitalization (including imaging, microbiology, ancillary and laboratory) are listed below for reference.     Microbiology: Recent Results (from the past 240 hour(s))  Blood Culture (routine x 2)     Status: None (Preliminary result)   Collection Time: 09/09/20  9:45 AM   Specimen: Right Antecubital; Blood  Result Value Ref Range Status   Specimen Description   Final    RIGHT ANTECUBITAL BOTTLES DRAWN AEROBIC AND ANAEROBIC   Special Requests   Final    Blood Culture results may not be optimal due to an inadequate volume of blood received in culture bottles Performed at [facility], [address], [place], [place] [address]    Culture PENDING  Incomplete   Report Status PENDING  Incomplete  Blood Culture (routine x 2)     Status: None (Preliminary result)   Collection Time: 09/09/20  9:56 AM   Specimen: BLOOD RIGHT FOREARM  Result Value Ref Range Status  Specimen Description   Final    BLOOD RIGHT FOREARM BOTTLES DRAWN AEROBIC AND ANAEROBIC   Special Requests   Final    Blood Culture adequate volume Performed at  [facility], [address] [place], [place] [address]    Culture PENDING  Incomplete   Report Status PENDING  Incomplete  Resp Panel by RT-PCR (Flu A&B, Covid) Nasopharyngeal Swab     Status: None   Collection Time: 09/09/20 10:08 AM   Specimen: Nasopharyngeal Swab; Nasopharyngeal(NP) swabs in vial transport medium  Result Value Ref Range Status   SARS Coronavirus 2 by RT PCR NEGATIVE NEGATIVE Final    Comment: (NOTE) SARS-CoV-2 target nucleic acids are NOT DETECTED.  The SARS-CoV-2 RNA is generally detectable in upper respiratory specimens during the acute phase of infection. The lowest concentration of SARS-CoV-2 viral copies this assay can detect is 138 copies/mL. A negative result does not preclude SARS-Cov-2 infection and should not be used as the sole basis for treatment or other patient management decisions. A negative result may occur with  improper specimen collection/handling, submission of specimen other than nasopharyngeal swab, presence of viral mutation(s) within the areas targeted by this assay, and inadequate number of viral copies(<138 copies/mL). A negative result must be combined with clinical observations, patient history, and epidemiological information. The expected result is Negative.  Fact Sheet for Patients:  EntrepreneurPulse.com.au  Fact Sheet for Healthcare Providers:  IncredibleEmployment.be  This test is no t yet approved or cleared by the [place] FDA and  has been authorized for detection and/or diagnosis of SARS-CoV-2 by FDA under an Emergency Use Authorization (EUA). This EUA will remain  in effect (meaning this test can be used) for the duration of the COVID-19 declaration under Section 564(b)(1) of the Act, 21 U.S.C.section 360bbb-3(b)(1), unless the authorization is terminated  or revoked sooner.       Influenza A by PCR NEGATIVE NEGATIVE Final   Influenza B by PCR NEGATIVE NEGATIVE Final     Comment: (NOTE) The Xpert Xpress SARS-CoV-2/FLU/RSV plus assay is intended as an aid in the diagnosis of influenza from Nasopharyngeal swab specimens and should not be used as a sole basis for treatment. Nasal washings and aspirates are unacceptable for Xpert Xpress SARS-CoV-2/FLU/RSV testing.  Fact Sheet for Patients: EntrepreneurPulse.com.au  Fact Sheet for Healthcare Providers: IncredibleEmployment.be  This test is not yet approved or cleared by the [place] FDA and has been authorized for detection and/or diagnosis of SARS-CoV-2 by FDA under an Emergency Use Authorization (EUA). This EUA will remain in effect (meaning this test can be used) for the duration of the COVID-19 declaration under Section 564(b)(1) of the Act, 21 U.S.C. section 360bbb-3(b)(1), unless the authorization is terminated or revoked.  Performed at [facility], [address], [place],  [address]      Labs: BNP (last 3 results) No results for input(s): BNP in the last 8760 hours. Basic Metabolic Panel: Recent Labs  Lab 09/09/20 0956 09/10/20 0557  NA 129* 132*  K 3.8 3.4*  CL 97* 102  CO2 25 22  GLUCOSE 110* 85  BUN 18 14  CREATININE 1.11 0.99  CALCIUM 8.4* 7.8*  MG 2.1  --    Liver Function Tests: Recent Labs  Lab 09/09/20 0956 09/10/20 0557  AST 64* 76*  ALT 45* 45*  ALKPHOS 104 109  BILITOT 0.5 0.7  PROT 6.7 5.2*  ALBUMIN 3.7 2.8*   Recent Labs  Lab 09/09/20 0956  LIPASE 36   No results for  input(s): AMMONIA in the last 168 hours. CBC: Recent Labs  Lab 09/09/20 0956 09/10/20 0557  WBC 2.7* 1.8*  NEUTROABS 2.2 1.2*  HGB 11.4* 9.8*  HCT 35.6* 31.8*  MCV 78.9* 79.9*  PLT 168 102*   Cardiac Enzymes: No results for input(s): CKTOTAL, CKMB, CKMBINDEX, TROPONINI in the last 168 hours. BNP: Invalid input(s): POCBNP CBG: No results for input(s): GLUCAP in the last 168 hours. D-Dimer No results for input(s): DDIMER in the  last 72 hours. Hgb A1c No results for input(s): HGBA1C in the last 72 hours. Lipid Profile No results for input(s): CHOL, HDL, LDLCALC, TRIG, CHOLHDL, LDLDIRECT in the last 72 hours. Thyroid function studies No results for input(s): TSH, T4TOTAL, T3FREE, THYROIDAB in the last 72 hours.  Invalid input(s): FREET3 Anemia work up No results for input(s): VITAMINB12, FOLATE, FERRITIN, TIBC, IRON, RETICCTPCT in the last 72 hours. Urinalysis    Component Value Date/Time   COLORURINE YELLOW 09/09/2020 1110   APPEARANCEUR CLEAR 09/09/2020 1110   LABSPEC 1.020 09/09/2020 1110   PHURINE 7.0 09/09/2020 1110   GLUCOSEU NEGATIVE 09/09/2020 1110   HGBUR MODERATE (A) 09/09/2020 1110   BILIRUBINUR NEGATIVE 09/09/2020 1110   KETONESUR 5 (A) 09/09/2020 1110   PROTEINUR 30 (A) 09/09/2020 1110   UROBILINOGEN 0.2 06/26/2010 1624   NITRITE NEGATIVE 09/09/2020 1110   LEUKOCYTESUR NEGATIVE 09/09/2020 1110   Sepsis Labs Invalid input(s): PROCALCITONIN,  WBC,  LACTICIDVEN Microbiology Recent Results (from the past 240 hour(s))  Blood Culture (routine x 2)     Status: None (Preliminary result)   Collection Time: 09/09/20  9:45 AM   Specimen: Right Antecubital; Blood  Result Value Ref Range Status   Specimen Description   Final    RIGHT ANTECUBITAL BOTTLES DRAWN AEROBIC AND ANAEROBIC   Special Requests   Final    Blood Culture results may not be optimal due to an inadequate volume of blood received in culture bottles Performed at [facility], [address], [place], [place] [address]    Culture PENDING  Incomplete   Report Status PENDING  Incomplete  Blood Culture (routine x 2)     Status: None (Preliminary result)   Collection Time: 09/09/20  9:56 AM   Specimen: BLOOD RIGHT FOREARM  Result Value Ref Range Status   Specimen Description   Final    BLOOD RIGHT FOREARM BOTTLES DRAWN AEROBIC AND ANAEROBIC   Special Requests   Final    Blood Culture adequate volume Performed at [facility],  [address] [place], [place] [address]    Culture PENDING  Incomplete   Report Status PENDING  Incomplete  Resp Panel by RT-PCR (Flu A&B, Covid) Nasopharyngeal Swab     Status: None   Collection Time: 09/09/20 10:08 AM   Specimen: Nasopharyngeal Swab; Nasopharyngeal(NP) swabs in vial transport medium  Result Value Ref Range Status   SARS Coronavirus 2 by RT PCR NEGATIVE NEGATIVE Final    Comment: (NOTE) SARS-CoV-2 target nucleic acids are NOT DETECTED.  The SARS-CoV-2 RNA is generally detectable in upper respiratory specimens during the acute phase of infection. The lowest concentration of SARS-CoV-2 viral copies this assay can detect is 138 copies/mL. A negative result does not preclude SARS-Cov-2 infection and should not be used as the sole basis for treatment or other patient management decisions. A negative result may occur with  improper specimen collection/handling, submission of specimen other than nasopharyngeal swab, presence of viral mutation(s) within the areas targeted by this assay, and inadequate number of viral copies(<138  copies/mL). A negative result must be combined with clinical observations, patient history, and epidemiological information. The expected result is Negative.  Fact Sheet for Patients:  EntrepreneurPulse.com.au  Fact Sheet for Healthcare Providers:  IncredibleEmployment.be  This test is no t yet approved or cleared by the [place] FDA and  has been authorized for detection and/or diagnosis of SARS-CoV-2 by FDA under an Emergency Use Authorization (EUA). This EUA will remain  in effect (meaning this test can be used) for the duration of the COVID-19 declaration under Section 564(b)(1) of the Act, 21 U.S.C.section 360bbb-3(b)(1), unless the authorization is terminated  or revoked sooner.       Influenza A by PCR NEGATIVE NEGATIVE Final   Influenza B by PCR NEGATIVE NEGATIVE Final    Comment: (NOTE) The  Xpert Xpress SARS-CoV-2/FLU/RSV plus assay is intended as an aid in the diagnosis of influenza from Nasopharyngeal swab specimens and should not be used as a sole basis for treatment. Nasal washings and aspirates are unacceptable for Xpert Xpress SARS-CoV-2/FLU/RSV testing.  Fact Sheet for Patients: EntrepreneurPulse.com.au  Fact Sheet for Healthcare Providers: IncredibleEmployment.be  This test is not yet approved or cleared by the [place] FDA and has been authorized for detection and/or diagnosis of SARS-CoV-2 by FDA under an Emergency Use Authorization (EUA). This EUA will remain in effect (meaning this test can be used) for the duration of the COVID-19 declaration under Section 564(b)(1) of the Act, 21 U.S.C. section 360bbb-3(b)(1), unless the authorization is terminated or revoked.  Performed at [facility], [address] [place], [place] [address]    Time coordinating discharge: 36 mins   SIGNED:  Irwin Brakeman, MD  [facility] 09/10/2020, 1:35 PM How to contact the [facility] Attending or Consulting provider [place] or covering provider during after hours [place], for this patient?  Check the care team in [facility] and look for a) attending/consulting [facility] provider listed and b) the [facility] team listed Log into [email or web address] and use [place]'s universal password to access. If you do not have the password, please contact the hospital operator. Locate the [facility] provider you are looking for under [facility] and page to a number that you can be directly reached. If you still have difficulty reaching the provider, please page the [facility] (Director on Call) for the Hospitalists listed on amion for assistance.

## 2020-09-10 NOTE — Discharge Instructions (Signed)
IMPORTANT INFORMATION: PAY CLOSE ATTENTION   PHYSICIAN DISCHARGE INSTRUCTIONS  Follow with Primary care provider  Emelia Loron, NP  and other consultants as instructed by your Hospitalist Physician  [place] IF SYMPTOMS COME BACK, WORSEN OR NEW PROBLEM DEVELOPS   Please note: You were cared for by a hospitalist during your hospital stay. Every effort will be made to forward records to your primary care provider.  You can request that your primary care provider send for your hospital records if they have not received them.  Once you are discharged, your primary care physician will handle any further medical issues. Please note that NO REFILLS for any discharge medications will be authorized once you are discharged, as it is imperative that you return to your primary care physician (or establish a relationship with a primary care physician if you do not have one) for your post hospital discharge needs so that they can reassess your need for medications and monitor your lab values.  Please get a complete blood count and chemistry panel checked by your Primary MD at your next visit, and again as instructed by your Primary MD.  Get Medicines reviewed and adjusted: Please take all your medications with you for your next visit with your Primary MD  Laboratory/radiological data: Please request your Primary MD to go over all hospital tests and procedure/radiological results at the follow up, please ask your primary care provider to get all Hospital records sent to his/her office.  In some cases, they will be blood work, cultures and biopsy results pending at the time of your discharge. Please request that your primary care provider follow up on these results.  If you are diabetic, please bring your blood sugar readings with you to your follow up appointment with primary care.    Please call and make your follow up appointments as soon as possible.    Also Note the  following: If you experience worsening of your admission symptoms, develop shortness of breath, life threatening emergency, suicidal or homicidal thoughts you must seek medical attention immediately by calling 911 or calling your MD immediately  if symptoms less severe.  You must read complete instructions/literature along with all the possible adverse reactions/side effects for all the Medicines you take and that have been prescribed to you. Take any new Medicines after you have completely understood and accpet all the possible adverse reactions/side effects.   Do not drive when taking Pain medications or sleeping medications (Benzodiazepines)  Do not take more than prescribed Pain, Sleep and Anxiety Medications. It is not advisable to combine anxiety,sleep and pain medications without talking with your primary care practitioner  Special Instructions: If you have smoked or chewed Tobacco  in the last 2 yrs please stop smoking, stop any regular Alcohol  and or any Recreational drug use.  Wear Seat belts while driving.  Do not drive if taking any narcotic, mind altering or controlled substances or recreational drugs or alcohol.

## 2020-09-10 NOTE — NC FL2 (Signed)
[place] LEVEL OF CARE SCREENING TOOL     IDENTIFICATION  Patient Name: Albert Gonzalez Birthdate: 08/01/1950 Sex: male Admission Date (Current Location): 09/09/2020  [facility] and [place] Number:  [employer] and Address:  [facility] [address], [place]      Provider Number: [id number]  Attending Physician Name and Address:  Murlean Iba, MD  Relative Name and Phone Number:       Current Level of Care:   Recommended Level of Care: [place] Prior Approval Number:    Date Approved/Denied:   PASRR Number: pending  Discharge Plan: SNF    Current Diagnoses: Patient Active Problem List   Diagnosis Date Noted   Sepsis due to pneumonia ([place]) 09/09/2020   Depression    Anxiety    Hyperlipidemia    Arthritis    Sarcoidosis    Right inguinal hernia    MDD (major depressive disorder), recurrent episode, moderate ([place]) 08/24/2016   Acute medial meniscus tear of left knee    Tear of lateral meniscus of right knee, current    Arthritis of knee, right    Deficiency of anterior cruciate ligament of right knee    Abnormal CT scan, colon 02/01/2011   Constipation 02/01/2011    Orientation RESPIRATION BLADDER Height & Weight     Self, Time, Situation, Place  Normal Continent Weight: 179 lb 0.2 oz (81.2 kg) Height:  5\' 11"  (180.3 cm)  BEHAVIORAL SYMPTOMS/MOOD NEUROLOGICAL BOWEL NUTRITION STATUS      Continent Diet (Regular. See d/c summary for updates.)  AMBULATORY STATUS COMMUNICATION OF NEEDS Skin   Limited Assist Verbally Normal                       Personal Care Assistance Level of Assistance  Bathing, Feeding, Dressing Bathing Assistance: Limited assistance Feeding assistance: Limited assistance Dressing Assistance: Limited assistance     Functional Limitations Info  Sight, Hearing, Speech Sight Info: Impaired Hearing Info: Adequate Speech Info: Adequate    SPECIAL CARE FACTORS  FREQUENCY  PT (By licensed PT)     PT Frequency: 5x weekly              Contractures      Additional Factors Info  Code Status, Allergies, Psychotropic Code Status Info: Full code Allergies Info: Sulfa Antibiotics Psychotropic Info: Xanax, Seroquel, Trazodone, Cymbalta         Current Medications (09/10/2020):  This is the current hospital active medication list Current Facility-Administered Medications  Medication Dose Route Frequency Provider Last Rate Last Admin   0.9 %  sodium chloride infusion   Intravenous Continuous Wynetta Emery, Clanford L, MD 100 mL/hr at 09/09/20 1849 New Bag at 09/09/20 1849   acetaminophen (TYLENOL) tablet 650 mg  650 mg Oral Q6H PRN Johnson, Clanford L, MD       ALPRAZolam Duanne Moron) tablet 0.5 mg  0.5 mg Oral Daily PRN Patrecia Pour, MD   0.5 mg at 09/09/20 1849   aspirin tablet 325 mg  325 mg Oral Daily Patrecia Pour, MD   325 mg at 09/10/20 0937   azithromycin (ZITHROMAX) 500 mg in sodium chloride 0.9 % 250 mL IVPB  500 mg Intravenous Q24H Vance Gather B, MD 250 mL/hr at 09/09/20 1714 500 mg at 09/09/20 1714   bisacodyl (DULCOLAX) suppository 10 mg  10 mg Rectal Daily PRN Patrecia Pour, MD       cefTRIAXone (ROCEPHIN) 1  g in sodium chloride 0.9 % 100 mL IVPB  1 g Intravenous Q24H Vance Gather B, MD 200 mL/hr at 09/09/20 1626 1 g at 09/09/20 1626   diclofenac (CATAFLAM) tablet 50 mg  50 mg Oral BID Patrecia Pour, MD       DULoxetine (CYMBALTA) DR capsule 60 mg  60 mg Oral Daily Patrecia Pour, MD   60 mg at 09/10/20 0937   enoxaparin (LOVENOX) injection 40 mg  40 mg Subcutaneous Q24H Vance Gather B, MD   40 mg at 09/09/20 1625   HYDROcodone-acetaminophen (NORCO) 7.5-325 MG per tablet 1 tablet  1 tablet Oral Q6H PRN Patrecia Pour, MD   1 tablet at 09/09/20 2233   ondansetron (ZOFRAN) tablet 4 mg  4 mg Oral Q6H PRN Patrecia Pour, MD       Or   ondansetron [facility]) injection 4 mg  4 mg Intravenous Q6H PRN Patrecia Pour, MD       polyethylene glycol (MIRALAX /  GLYCOLAX) packet 17 g  17 g Oral Daily PRN Patrecia Pour, MD       QUEtiapine (SEROQUEL) tablet 25 mg  25 mg Oral QHS Patrecia Pour, MD   25 mg at 09/09/20 2233   senna (SENOKOT) tablet 8.6 mg  1 tablet Oral BID Vance Gather B, MD   8.6 mg at 09/10/20 2820   tiZANidine (ZANAFLEX) tablet 4 mg  4 mg Oral Daily PRN Patrecia Pour, MD   4 mg at 09/09/20 1623   traZODone (DESYREL) tablet 50 mg  50 mg Oral QHS Patrecia Pour, MD   50 mg at 09/09/20 2234     Discharge Medications: Please see discharge summary for a list of discharge medications.  Relevant Imaging Results:  Relevant Lab Results:   Additional Information SSN: [id number]. Observation. Pt had Moderna vaccines 05/30/19 and 07/02/19.  Salome Arnt, LCSW

## 2020-09-10 NOTE — NC FL2 (Signed)
[place] LEVEL OF CARE SCREENING TOOL     IDENTIFICATION  Patient Name: Albert Gonzalez Birthdate: 1951/02/03 Sex: male Admission Date (Current Location): 09/09/2020  [facility] and [place] Number:  [employer] and Address:  [facility] [address] [place]      Provider Number: [id number]  Attending Physician Name and Address:  Murlean Iba, MD  Relative Name and Phone Number:       Current Level of Care: Hospital Recommended Level of Care: [place] Prior Approval Number:    Date Approved/Denied:   PASRR Number: pending  Discharge Plan: SNF    Current Diagnoses: Patient Active Problem List   Diagnosis Date Noted   Sepsis due to pneumonia ([place]) 09/09/2020   Depression    Anxiety    Hyperlipidemia    Arthritis    Sarcoidosis    Right inguinal hernia    MDD (major depressive disorder), recurrent episode, moderate ([place]) 08/24/2016   Acute medial meniscus tear of left knee    Tear of lateral meniscus of right knee, current    Arthritis of knee, right    Deficiency of anterior cruciate ligament of right knee    Abnormal CT scan, colon 02/01/2011   Constipation 02/01/2011    Orientation RESPIRATION BLADDER Height & Weight     Self, Time, Situation, Place  Normal Continent Weight: 81.2 kg Height:  5\' 11"  (180.3 cm)  BEHAVIORAL SYMPTOMS/MOOD NEUROLOGICAL BOWEL NUTRITION STATUS      Continent Diet (Regular. See d/c summary for updates.)  AMBULATORY STATUS COMMUNICATION OF NEEDS Skin   Limited Assist Verbally Normal                       Personal Care Assistance Level of Assistance  Bathing, Feeding, Dressing Bathing Assistance: Limited assistance Feeding assistance: Limited assistance Dressing Assistance: Limited assistance     Functional Limitations Info  Sight, Hearing, Speech Sight Info: Impaired Hearing Info: Adequate Speech Info: Adequate    SPECIAL CARE FACTORS FREQUENCY   PT (By licensed PT)     PT Frequency: 5x weekly              Contractures      Additional Factors Info  Code Status, Allergies, Psychotropic Code Status Info: Full code Allergies Info: Sulfa Antibiotics Psychotropic Info: Xanax, Seroquel, Trazodone, Cymbalta         Current Medications (09/10/2020):  This is the current hospital active medication list Current Facility-Administered Medications  Medication Dose Route Frequency Provider Last Rate Last Admin   0.9 %  sodium chloride infusion   Intravenous Continuous Johnson, Clanford L, MD 10 mL/hr at 09/10/20 1052 Rate Change at 09/10/20 1052   acetaminophen (TYLENOL) tablet 650 mg  650 mg Oral Q6H PRN Wynetta Emery, Clanford L, MD   650 mg at 09/10/20 1051   ALPRAZolam (XANAX) tablet 0.5 mg  0.5 mg Oral Daily PRN Patrecia Pour, MD   0.5 mg at 09/09/20 1849   aspirin tablet 325 mg  325 mg Oral Daily Patrecia Pour, MD   325 mg at 09/10/20 0937   azithromycin (ZITHROMAX) 500 mg in sodium chloride 0.9 % 250 mL IVPB  500 mg Intravenous Q24H Vance Gather B, MD 250 mL/hr at 09/09/20 1714 500 mg at 09/09/20 1714   bisacodyl (DULCOLAX) suppository 10 mg  10 mg Rectal Daily PRN Patrecia Pour, MD       cefTRIAXone (ROCEPHIN) 1 g in  sodium chloride 0.9 % 100 mL IVPB  1 g Intravenous Q24H Vance Gather B, MD 200 mL/hr at 09/09/20 1626 1 g at 09/09/20 1626   diclofenac (CATAFLAM) tablet 50 mg  50 mg Oral BID Patrecia Pour, MD   50 mg at 09/10/20 1052   DULoxetine (CYMBALTA) DR capsule 60 mg  60 mg Oral Daily Patrecia Pour, MD   60 mg at 09/10/20 0937   enoxaparin (LOVENOX) injection 40 mg  40 mg Subcutaneous Q24H Vance Gather B, MD   40 mg at 09/09/20 1625   HYDROcodone-acetaminophen (NORCO) 7.5-325 MG per tablet 1 tablet  1 tablet Oral Q6H PRN Patrecia Pour, MD   1 tablet at 09/09/20 2233   ondansetron (ZOFRAN) tablet 4 mg  4 mg Oral Q6H PRN Patrecia Pour, MD       Or   ondansetron [facility]) injection 4 mg  4 mg Intravenous Q6H PRN Patrecia Pour, MD        polyethylene glycol (MIRALAX / GLYCOLAX) packet 17 g  17 g Oral Daily PRN Patrecia Pour, MD       QUEtiapine (SEROQUEL) tablet 25 mg  25 mg Oral QHS Patrecia Pour, MD   25 mg at 09/09/20 2233   senna (SENOKOT) tablet 8.6 mg  1 tablet Oral BID Vance Gather B, MD   8.6 mg at 09/10/20 8088   tiZANidine (ZANAFLEX) tablet 4 mg  4 mg Oral Daily PRN Patrecia Pour, MD   4 mg at 09/09/20 1623   traZODone (DESYREL) tablet 50 mg  50 mg Oral QHS Patrecia Pour, MD   50 mg at 09/09/20 2234     Discharge Medications: Please see discharge summary for a list of discharge medications.  Relevant Imaging Results:  Relevant Lab Results:   Additional Information SSN: [id number]. Observation. Pt had Moderna vaccines 05/30/19 and 07/02/19.  Joaquin Courts, RN

## 2020-09-10 NOTE — TOC Initial Note (Signed)
Transition of Care [facility]) - Initial/Assessment Note    Patient Details  Name: Albert Gonzalez MRN: [id number] Date of Birth: 11/09/1950  Transition of Care [facility]) CM/SW Contact:    Salome Arnt, [place] Phone Number: 09/10/2020, 11:08 AM  Clinical Narrative:  Pt in observation with sepsis due to pneumonia. He reports he lives with his wife and is fairly independent at baseline. PT evaluated pt and recommend SNF. Discussed with pt, including Medicare 3 day waiver. TOC to try to find out if this is still in effect. If so, pt agreeable to SNF. However, if it is not, he will return home with home health.                  Expected Discharge Plan: Skilled Nursing Facility Barriers to Discharge: No SNF bed   Patient Goals and CMS Choice Patient states their goals for this hospitalization and ongoing recovery are:: short term SNF   Choice offered to / list presented to : Patient  Expected Discharge Plan and Services Expected Discharge Plan: [place] In-house Referral: Clinical Social Work   Post Acute Care Choice: [place] Living arrangements for the past 2 months: [place] Expected Discharge Date: 09/10/20               DME Arranged: N/A                    Prior Living Arrangements/Services Living arrangements for the past 2 months: Single Family Home Lives with:: Spouse Patient language and need for interpreter reviewed:: Yes            Current home services: DME (walker) Criminal Activity/Legal Involvement Pertinent to Current Situation/Hospitalization: No - Comment as needed  Activities of Daily Living      Permission Sought/Granted                  Emotional Assessment   Attitude/Demeanor/Rapport: Engaged Affect (typically observed): Accepting Orientation: : Oriented to Self, Oriented to Place, Oriented to  Time, Oriented to Situation Alcohol / Substance Use: Not Applicable Psych Involvement: No  (comment)  Admission diagnosis:  Sepsis due to pneumonia ([place]) [J18.9, A41.9] Multifocal pneumonia [J18.9] Patient Active Problem List   Diagnosis Date Noted   Sepsis due to pneumonia ([place]) 09/09/2020   Depression    Anxiety    Hyperlipidemia    Arthritis    Sarcoidosis    Right inguinal hernia    MDD (major depressive disorder), recurrent episode, moderate ([place]) 08/24/2016   Acute medial meniscus tear of left knee    Tear of lateral meniscus of right knee, current    Arthritis of knee, right    Deficiency of anterior cruciate ligament of right knee    Abnormal CT scan, colon 02/01/2011   Constipation 02/01/2011   PCP:  Emelia Loron, NP Pharmacy:   [facility], [place] [address] Phone: [phone] Fax: [phone]  [facility]  ([place], [place] [address] Phone: [phone] Fax: [phone]     Social Determinants of Health (SDOH) Interventions    Readmission Risk Interventions No flowsheet data found.

## 2020-09-11 LAB — URINE CULTURE

## 2020-09-14 LAB — CULTURE, BLOOD (ROUTINE X 2)
Culture: NO GROWTH
Culture: NO GROWTH
Special Requests: ADEQUATE

## 2020-11-15 ENCOUNTER — Telehealth: Payer: Self-pay | Admitting: Orthopedic Surgery

## 2020-11-15 NOTE — Telephone Encounter (Signed)
Patient called at 9:27 am and left message stating he twisted his knee the other week and is wanting to schedule an appointment with Dr. Aline Brochure.   I tired to call the patient back to schedule an appt the phone just rang and rang no voicemail

## 2020-11-22 ENCOUNTER — Other Ambulatory Visit: Payer: Self-pay

## 2020-11-22 ENCOUNTER — Ambulatory Visit: Payer: Medicare Other

## 2020-11-22 ENCOUNTER — Ambulatory Visit (INDEPENDENT_AMBULATORY_CARE_PROVIDER_SITE_OTHER): Payer: Medicare Other | Admitting: Orthopedic Surgery

## 2020-11-22 ENCOUNTER — Encounter: Payer: Self-pay | Admitting: Orthopedic Surgery

## 2020-11-22 VITALS — BP 149/87 | HR 94 | Ht 71.0 in | Wt 167.0 lb

## 2020-11-22 DIAGNOSIS — M25561 Pain in right knee: Secondary | ICD-10-CM

## 2020-11-22 NOTE — Progress Notes (Signed)
  Chief Complaint  Patient presents with   Knee Pain    Right, twisted getting out of car heard a pop and pain and swelling immediately   70 year old male history of previous arthroscopy right knee doing well in his normal state of health until he stepped out of a car and twisted his knee 2 weeks ago.  The knee popped out of joint or at least he felt it did although he did not see the kneecap on the side of the knee  After noticing immediate pain and swelling he treated himself with ice and bracing.  He could not get to the doctor because his wife has become very ill and is confined to the house   Past Medical History:  Diagnosis Date   Anxiety    Arthritis    Depression    History of kidney stones    Hyperlipidemia    Inguinal hernia right    Insomnia    Myalgia     BP (!) 149/87   Pulse 94   Ht 5\' 11"  (1.803 m)   Wt 167 lb (75.8 kg)   BMI 23.29 kg/m   He is awake alert and oriented x3 he is very emotional today because his wife has been so ill  I have known him for a long time he is a very reliable patient  His right knee is tender on the lateral joint line there is no effusion the skin is intact with no rash he has full range of motion without pain his ligaments are stable his muscle tone is 5 out of 5 I would called his meniscal test equivocal at best  His right knee shows osteoarthritis with secondary bone changes joint space narrowing and bone spurs  Acute pain unclear etiology possible meniscal tear possible patellar subluxation  Recommend intra-articular injection  Ice continue  Continue with brace  The knee was injected with Celestone  Right knee injection patient agreed to confirm the injection is the right knee with Celestone and lidocaine we injected 6 mg of Celestone 4 cc 1% lidocaine.  The knee was cleaned with alcohol and sprayed with ethyl chloride prior to prior to injection  I will see the patient in 4 weeks

## 2020-11-22 NOTE — Patient Instructions (Signed)

## 2020-12-20 ENCOUNTER — Ambulatory Visit (INDEPENDENT_AMBULATORY_CARE_PROVIDER_SITE_OTHER): Payer: Medicare Other | Admitting: Orthopedic Surgery

## 2020-12-20 ENCOUNTER — Encounter: Payer: Self-pay | Admitting: Orthopedic Surgery

## 2020-12-20 ENCOUNTER — Other Ambulatory Visit: Payer: Self-pay

## 2020-12-20 VITALS — BP 103/66 | HR 80 | Ht 70.0 in | Wt 167.0 lb

## 2020-12-20 DIAGNOSIS — G8929 Other chronic pain: Secondary | ICD-10-CM | POA: Diagnosis not present

## 2020-12-20 DIAGNOSIS — M25561 Pain in right knee: Secondary | ICD-10-CM

## 2020-12-20 NOTE — Progress Notes (Signed)
Chief Complaint  Patient presents with   Knee Pain    R/ its been really hurting and I haven't been able to sleep because of it.    Return visit check right knee continues to complain of pain and swelling despite using ice and anti-inflammatories.  Most of his pain is in the lateral joint line and patella  Examination shows tenderness around the patella joint effusion with some lateral joint line tenderness  The ligaments of the knee feels stable to medial joint line is nontender  The recommendation is for him to get an MRI of his right knee to determine if there is intra-articular pathology that needs surgical intervention  He will continue to ice and take anti-inflammatories he is wearing a knee sleeve and follow-up after his MRI has been completed

## 2020-12-20 NOTE — Patient Instructions (Signed)
While we are working on your approval for MRI please go ahead and call to schedule your appointment with [facility] within at least one (1) week.   Central Scheduling [phone]  

## 2020-12-29 ENCOUNTER — Ambulatory Visit (INDEPENDENT_AMBULATORY_CARE_PROVIDER_SITE_OTHER): Payer: Medicare Other | Admitting: Pulmonary Disease

## 2020-12-29 ENCOUNTER — Other Ambulatory Visit: Payer: Self-pay

## 2020-12-29 ENCOUNTER — Encounter: Payer: Self-pay | Admitting: Pulmonary Disease

## 2020-12-29 VITALS — BP 118/78 | HR 87 | Ht 70.0 in | Wt 171.2 lb

## 2020-12-29 DIAGNOSIS — Z8709 Personal history of other diseases of the respiratory system: Secondary | ICD-10-CM | POA: Diagnosis not present

## 2020-12-29 DIAGNOSIS — J4540 Moderate persistent asthma, uncomplicated: Secondary | ICD-10-CM

## 2020-12-29 DIAGNOSIS — R7611 Nonspecific reaction to tuberculin skin test without active tuberculosis: Secondary | ICD-10-CM

## 2020-12-29 DIAGNOSIS — D869 Sarcoidosis, unspecified: Secondary | ICD-10-CM

## 2020-12-29 NOTE — Patient Instructions (Signed)
Will schedule high resolution CT chest at [facility]  Will arrange for pulmonary function test in [place] office and follow up in after that in 8 weeks

## 2020-12-29 NOTE — Progress Notes (Signed)
[facility]  Chief Complaint  Patient presents with   Sarcoidosis    Past Surgical History:  He  has a past surgical history that includes Tonsillectomy; Knee surgery; Colonoscopy (02/08/2011); Kyphoplasty; Knee arthroscopy with medial menisectomy (Right, 04/05/2016); Inguinal hernia repair (Right, 01/29/2017); and Axillary lymph node biopsy (Right, 1980).  Past Medical History:  Anxiety, OA, Depression, Nephrolithiasis, HLD, Insomnia  Constitutional:  BP 118/78   Pulse 87   Ht 5\' 10"  (1.778 m)   Wt 171 lb 3.2 oz (77.7 kg)   SpO2 98%   BMI 24.56 kg/m   Brief Summary:  Albert Gonzalez is a 70 y.o. male former smoker with sarcoidosis.      Subjective:   He is here with his wife.  He his a retired Therapist, sports and Statistician.  He was found to be PPD positive in 1979.  He was seen by Dr. Wende Neighbors with pulmonary then.  Treated with INH for 1 year.  Follow up chest xray in 1980 showed new hilar adenopathy.  He had right axillary lymph node biopsy and this was consistent with sarcoidosis.  He didn't need specific therapy and didn't have any symptoms from sarcoidosis.  He was doing well until he was in hospital in July 2022 for fever from pneumonia.  Chest xray from 09/09/20 showed coarse b/l interstitial opacities Rt > Lt.  Since then he developed a dry cough.  This would get worse with allergies and when he was working outside.  His PCP prescribed wixela and this has helped.  He isn't needing to use albuterol as much since he started wixela.  No having sinus congestion, sore throat, dysphagia, reflux, chest pain, wheeze, skin rash, leg swelling, joint swelling, or hemoptysis.  He has been snoring at night.  He gets tired during the day.  He was told he needed a sleepy study, but wants to wait to get this scheduled.  Physical Exam:   Appearance - well kempt   ENMT - no sinus tenderness, no oral exudate, no LAN, Mallampati 3 airway, no  stridor  Respiratory - equal breath sounds bilaterally, no wheezing or rales  CV - s1s2 regular rate and rhythm, no murmurs  Ext - no clubbing, no edema  Skin - no rashes  Psych - normal mood and affect   Pulmonary testing:    Chest Imaging:    Sleep Tests:    Social History:  He  reports that he quit smoking about 30 years ago. His smoking use included pipe and cigarettes. He has a 10.00 pack-year smoking history. He has never used smokeless tobacco. He reports current alcohol use. He reports that he does not use drugs.  Family History:  His family history includes Suicidality in his father.    Discussion:  He had pneumonia in July 2022.  He has allergies and most recent symptoms more consistent with asthma.  He reports benefit from inhaler therapy.  He has prior history of sarcoidosis and his chest xray showed nodular opacities.  Assessment/Plan:   Allergic asthma. - continue wixela and prn albuterol  Sarcoidosis. - will arrange for high resolution CT chest and pulmonary function test  Snoring. - monitor sleep pattern - might need home sleep study   Time Spent Involved in Patient Care on Day of Examination:  46 minutes  Follow up:   Patient Instructions  Will schedule high resolution CT chest at [facility]  Will arrange for pulmonary function test in [facility]  office and follow up in after that in 8 weeks  Medication List:   Allergies as of 12/29/2020       Reactions   Sulfa Antibiotics Rash   Childhood         Medication List        Accurate as of December 29, 2020 11:39 AM. If you have any questions, ask your nurse or doctor.          STOP taking these medications    azithromycin 500 MG tablet Commonly known as: ZITHROMAX Stopped by: Chesley Mires, MD   pantoprazole 40 MG tablet Commonly known as: Protonix Stopped by: Chesley Mires, MD       TAKE these medications    acetaminophen 325 MG tablet Commonly known as:  TYLENOL Take 2 tablets (650 mg total) by mouth every 6 (six) hours as needed for fever, mild pain or headache.   albuterol 108 (90 Base) MCG/ACT inhaler Commonly known as: VENTOLIN HFA SMARTSIG:1 Puff(s) Via Inhaler 4 Times Daily PRN   ALPRAZolam 0.5 MG tablet Commonly known as: XANAX Take 1 tablet (0.5 mg total) by mouth daily as needed for anxiety.   aspirin 325 MG tablet Take 325 mg by mouth daily.   atorvastatin 40 MG tablet Commonly known as: LIPITOR Take 40 mg daily at 6 PM by mouth.   busPIRone 10 MG tablet Commonly known as: BUSPAR Take 10 mg by mouth 3 (three) times daily.   CALCIUM + D PO Take 2 tablets by mouth daily.   diclofenac 50 MG tablet Commonly known as: CATAFLAM Take 1 tablet (50 mg total) by mouth 2 (two) times daily as needed (arthritis pain).   DULoxetine 60 MG capsule Commonly known as: CYMBALTA Take 1 capsule (60 mg total) by mouth daily. Total of 90 mg daily (60 mg + 30 mg)   EQ VISION FORMULA 50+ PO Take 1 tablet by mouth daily.   GLUCOSAMINE CHOND DOUBLE STR PO Take 2 tablets by mouth daily.   HYDROcodone-acetaminophen 7.5-325 MG tablet Commonly known as: NORCO Take 1 tablet by mouth 2 (two) times daily.   multivitamin tablet Take 1 tablet by mouth daily. Men's 50+   polyethylene glycol 17 g packet Commonly known as: MIRALAX / GLYCOLAX Take 17 g by mouth daily as needed for mild constipation.   QUEtiapine 25 MG tablet Commonly known as: SEROQUEL Take 25 mg by mouth at bedtime.   tiZANidine 4 MG tablet Commonly known as: ZANAFLEX Take 4 mg daily as needed by mouth for muscle spasms.   tiZANidine 2 MG tablet Commonly known as: ZANAFLEX Take 2 mg by mouth 3 (three) times daily as needed.   traZODone 50 MG tablet Commonly known as: DESYREL Take 50 mg by mouth at bedtime.   Wixela Inhub 250-50 MCG/ACT Aepb Generic drug: fluticasone-salmeterol Inhale 1 puff into the lungs 2 (two) times daily.        Signature:  Chesley Mires, MD [place] Pager - [phone] 12/29/2020, 11:39 AM

## 2021-01-04 ENCOUNTER — Other Ambulatory Visit: Payer: Self-pay

## 2021-01-04 ENCOUNTER — Ambulatory Visit (HOSPITAL_COMMUNITY)
Admission: RE | Admit: 2021-01-04 | Discharge: 2021-01-04 | Disposition: A | Discharge status: Home / Self Care | Payer: Medicare Other | Source: Ambulatory Visit | Attending: Orthopedic Surgery | Admitting: Orthopedic Surgery

## 2021-01-04 DIAGNOSIS — G8929 Other chronic pain: Secondary | ICD-10-CM | POA: Diagnosis present

## 2021-01-04 DIAGNOSIS — M25561 Pain in right knee: Secondary | ICD-10-CM | POA: Diagnosis present

## 2021-01-06 ENCOUNTER — Ambulatory Visit (INDEPENDENT_AMBULATORY_CARE_PROVIDER_SITE_OTHER): Payer: Medicare Other | Admitting: Orthopedic Surgery

## 2021-01-06 ENCOUNTER — Encounter: Payer: Self-pay | Admitting: Orthopedic Surgery

## 2021-01-06 ENCOUNTER — Other Ambulatory Visit: Payer: Self-pay

## 2021-01-06 DIAGNOSIS — M1909 Primary osteoarthritis, other specified site: Secondary | ICD-10-CM

## 2021-01-06 DIAGNOSIS — E7849 Other hyperlipidemia: Secondary | ICD-10-CM | POA: Diagnosis not present

## 2021-01-06 DIAGNOSIS — M1710 Unilateral primary osteoarthritis, unspecified knee: Secondary | ICD-10-CM

## 2021-01-06 DIAGNOSIS — G8929 Other chronic pain: Secondary | ICD-10-CM | POA: Diagnosis not present

## 2021-01-06 DIAGNOSIS — M1711 Unilateral primary osteoarthritis, right knee: Secondary | ICD-10-CM

## 2021-01-06 MED ORDER — BUPIVACAINE-MELOXICAM ER 200-6 MG/7ML IJ SOLN: 400.0000 mg | Freq: Once | INTRAMUSCULAR | Status: DC

## 2021-01-06 NOTE — Patient Instructions (Signed)
Your surgery will be at [facility] by Dr Aline Brochure expect to be in the hospital overnight. The hospital will contact you with a preoperative appointment to discuss Anesthesia.  The number is [phone] Please bring your medications with you for the appointment. They will tell you the arrival time and medication instructions when you have your preoperative evaluation. Do not wear nail polish the day of your surgery and if you take Phentermine you need to stop this medication ONE WEEK prior to your surgery.   You will also get a call from a representative of [employer], they have a machine that you will use in the first few weeks after surgery. It is called a CPM.   You will have home physical therapy for 2 weeks after surgery, the home health agency will call you before or just following the surgery to set up visits. [facility] is the agency we normally use, unless you request another agency.   You will get a call also from outpatient therapy for therapy starting when the home therapy is done.  If you have questions or need to Reschedule the surgery, call the office ask for Masahiro Iglesia.

## 2021-01-06 NOTE — Progress Notes (Signed)
Chief Complaint  Patient presents with   Knee Pain    Right/ here for MRI review    Albert Gonzalez is 70 year old male he is here today to get MRI results regarding his exacerbated right knee pain  His MRI indicates that he has severe arthritis all over the joint as well as meniscal tears and residual meniscal tissue from previous meniscectomy  He says he was able to stop taking the hydrocodone but he still having pain and his wife says he does not sleep at night  We discussed this at length we gave him 2 options basically arthroscopy to try to get the knee back to its prior condition knowing that he would need knee replacement in the future or go ahead and proceed with knee replacement understanding that it is a longer recovery and an operation associated with much more potential complications  He decided to proceed with a right total knee replacement  He will have his preop history and physical on December 7 with surgery scheduled for December 13 for right total knee  Chronic condition worse, outside films MRI read, decision for surgery.  Encounter Diagnoses  Name Primary?   Chronic pain of right knee Yes   Primary localized osteoarthritis of knee

## 2021-01-07 ENCOUNTER — Telehealth: Payer: Self-pay | Admitting: Pulmonary Disease

## 2021-01-07 NOTE — Telephone Encounter (Signed)
Noted.   VS please advise. Thanks :)

## 2021-01-07 NOTE — Telephone Encounter (Signed)
Okay to postpone CT, PFT and ROV until after he has recovered from knee surgery.

## 2021-01-10 NOTE — Telephone Encounter (Signed)
I called the phone number and it rang and rang with no VM.

## 2021-01-11 NOTE — Telephone Encounter (Signed)
I have attempted to call the pt but no answer and no VM

## 2021-01-18 NOTE — Telephone Encounter (Signed)
Called pt and spoke with spouse letting her know the info stated by VS and she verbalized understanding. Nothing further needed.

## 2021-01-27 ENCOUNTER — Other Ambulatory Visit: Payer: Self-pay | Admitting: Orthopedic Surgery

## 2021-01-27 DIAGNOSIS — M1710 Unilateral primary osteoarthritis, unspecified knee: Secondary | ICD-10-CM

## 2021-02-02 ENCOUNTER — Other Ambulatory Visit: Payer: Self-pay

## 2021-02-02 ENCOUNTER — Ambulatory Visit (INDEPENDENT_AMBULATORY_CARE_PROVIDER_SITE_OTHER): Payer: Medicare Other | Admitting: Orthopedic Surgery

## 2021-02-02 ENCOUNTER — Encounter: Payer: Self-pay | Admitting: Orthopedic Surgery

## 2021-02-02 VITALS — BP 127/67 | HR 73 | Ht 70.0 in | Wt 170.0 lb

## 2021-02-02 DIAGNOSIS — M1710 Unilateral primary osteoarthritis, unspecified knee: Secondary | ICD-10-CM

## 2021-02-02 DIAGNOSIS — M1711 Unilateral primary osteoarthritis, right knee: Secondary | ICD-10-CM

## 2021-02-02 NOTE — Progress Notes (Signed)
Preop  OFFICE VISIT   Chief Complaint  Patient presents with   Knee Pain    Preop total knee replacement    70 year old male chronic pain right knee   He had another injury to the right knee we tried to treat him conservatively with aspiration injection he did not improve we put him on anti-inflammatories still no improvement  He had an MRI of his knee severe arthritis along with a torn meniscus  At his age of 70 the recommended treatment at this point is to go ahead and replace the knee as long as he is excepted the risks and benefits of this versus arthroscopic surgery and then another knee surgery later which she has agreed to the knee replacement now  His primary complaint is severe pain and loss of function    ROS: Albert Gonzalez has no acute problems under review of systems  BP 127/67   Pulse 73   Ht 5\' 10"  (1.778 m)   Wt 170 lb (77.1 kg)   BMI 24.39 kg/m   Body mass index is 24.39 kg/m.  General appearance: Well-developed well-nourished no gross deformities  Cardiovascular normal pulse and perfusion normal color without edema  Neurologically no sensation loss or deficits or pathologic reflexes  Psychological: Awake alert and oriented x3 mood and affect normal  Skin no lacerations or ulcerations no nodularity no palpable masses, no erythema or nodularity  Musculoskeletal:   Right knee has a flexion contracture 5 degrees Tenderness lateral compartment He has 125 degree knee flexion No ligament laxity Muscle tone normal  Past Medical History:  Diagnosis Date   Anxiety    Arthritis    Depression    History of kidney stones    Hyperlipidemia    Inguinal hernia right    Insomnia    Myalgia    PPD positive, treated 1979   1 year of INH   Raynaud phenomenon    Sarcoidosis 1980    Past Surgical History:  Procedure Laterality Date   AXILLARY LYMPH NODE BIOPSY Right 1980   COLONOSCOPY  02/08/2011   Procedure: COLONOSCOPY;  Surgeon: Daneil Dolin, MD;   Location: [facility];  Service: Endoscopy;  Laterality: N/A;  1:30 PM   INGUINAL HERNIA REPAIR Right 01/29/2017   Procedure: HERNIA REPAIR INGUINAL ADULT WITH MESH;  Surgeon: Virl Cagey, MD;  Location: [facility];  Service: General;  Laterality: Right;   KNEE ARTHROSCOPY WITH MEDIAL MENISECTOMY Right 04/05/2016   Procedure: RIGHT KNEE ARTHROSCOPY WITH PARTIAL  LATERAL MENISECTOMY, DEBRIDEMENT MEDIAL MENISCUS, ACL DEBRIDEMENT, MICRO FRACTURE OF FEMUR;  Surgeon: Carole Civil, MD;  Location: [facility];  Service: Orthopedics;  Laterality: Right;   KNEE SURGERY     left   KYPHOPLASTY     TONSILLECTOMY      Family History  Problem Relation Age of Onset   Suicidality Father    Colon cancer Neg Hx    Liver disease Neg Hx    Inflammatory bowel disease Neg Hx    Anesthesia problems Neg Hx    Social History   Tobacco Use   Smoking status: Former    Packs/day: 0.50    Years: 20.00    Pack years: 10.00    Types: Pipe, Cigarettes    Quit date: 04/03/1990    Years since quitting: 30.8   Smokeless tobacco: Never   Tobacco comments:    quit many years ago  Vaping Use   Vaping Use: Never used  Substance Use Topics  Alcohol use: Yes    Comment: occassional   Drug use: No    Allergies  Allergen Reactions   Sulfa Antibiotics Rash    Childhood     Current Meds  Medication Sig   acetaminophen (TYLENOL) 325 MG tablet Take 2 tablets (650 mg total) by mouth every 6 (six) hours as needed for fever, mild pain or headache.   albuterol (VENTOLIN HFA) 108 (90 Base) MCG/ACT inhaler 2 puffs every 4 (four) hours as needed for wheezing or shortness of breath.   ALPRAZolam (XANAX) 0.5 MG tablet Take 1 tablet (0.5 mg total) by mouth daily as needed for anxiety.   aspirin 325 MG tablet Take 325 mg by mouth daily as needed for mild pain.   atorvastatin (LIPITOR) 40 MG tablet Take 40 mg daily at 6 PM by mouth.    busPIRone (BUSPAR) 10 MG tablet Take 10 mg by mouth 3 (three) times daily.    Calcium Citrate-Vitamin D (CALCIUM + D PO) Take 2 tablets by mouth daily.   diclofenac (CATAFLAM) 50 MG tablet Take 1 tablet (50 mg total) by mouth 2 (two) times daily as needed (arthritis pain).   DULoxetine (CYMBALTA) 60 MG capsule Take 1 capsule (60 mg total) by mouth daily. Total of 90 mg daily (60 mg + 30 mg)   fluticasone-salmeterol (ADVAIR) 250-50 MCG/ACT AEPB Inhale 1 puff into the lungs in the morning and at bedtime.   HYDROcodone-acetaminophen (NORCO) 7.5-325 MG tablet Take 1 tablet by mouth 2 (two) times daily.   Misc Natural Products (GLUCOSAMINE CHOND DOUBLE STR PO) Take 2 tablets by mouth daily.   Multiple Vitamin (MULTIVITAMIN) tablet Take 1 tablet by mouth daily. Men's 50+   Multiple Vitamins-Minerals (EQ VISION FORMULA 50+ PO) Take 1 tablet by mouth daily.   polyethylene glycol (MIRALAX / GLYCOLAX) 17 g packet Take 17 g by mouth daily as needed for mild constipation.   tiZANidine (ZANAFLEX) 4 MG tablet Take 4 mg by mouth 3 (three) times daily as needed for muscle spasms.   traZODone (DESYREL) 50 MG tablet Take 50 mg by mouth at bedtime.   Current Facility-Administered Medications for the 02/02/21 encounter (Office Visit) with Carole Civil, MD  Medication   bupivacaine-meloxicam ER (ZYNRELEF) injection 400 mg     MEDICAL DECISION MAKING  A.  Encounter Diagnosis  Name Primary?   Primary localized osteoarthritis of knee Yes    B. DATA ANALYSED:   IMAGING:  XRAYS: Right knee acute pain   AP of both knees sunrise and lateral right knee   Overall alignment shows slight increase in valgus   Lateral compartment arthrosis moderate narrowing secondary bone changes are mild to moderate as well   No acute fracture   Impression osteoarthritis lateral compartment right knee   MRI  IMPRESSION: 1. Severe attenuation of the body and posterior horn of the medial meniscus likely reflecting prior meniscectomy. 2. Severe attenuation of the body and posterior horn of  the lateral meniscus with irregularity of the remaining lateral meniscus likely reflecting sequela of prior meniscectomy. Horizontal tear of the anterior horn of the lateral meniscus extending to the superior articular surface. 3. Tricompartmental cartilage abnormalities as described above. 4. Intact ACL with mucinous degeneration.     Electronically Signed   By: Kathreen Devoid M.D.   On: 01/05/2021 09:25    Orders: SURGERY   C. MANAGEMENT   The procedure has been fully reviewed with the patient; The risks and benefits of surgery have been discussed and explained  and understood. Alternative treatment has also been reviewed, questions were encouraged and answered. The postoperative plan is also been reviewed.  RIGHT TKA     No orders of the defined types were placed in this encounter.    Arther Abbott, MD  02/02/2021 11:35 AM

## 2021-02-02 NOTE — Patient Instructions (Signed)
Your procedure is scheduled on: 02/08/2021  Report to [facility] at   6:15  AM.  Call this number if you have problems the morning of surgery: [phone]   Remember:   Do not Eat or Drink after midnight         No Smoking the morning of surgery  :  Take these medicines the morning of surgery with A SIP OF WATER: Buspar, Cymbalta, and xanax if needed  Use inhalers if needed   Do not wear jewelry, make-up or nail polish.  Do not wear lotions, powders, or perfumes. You may wear deodorant.  Do not shave 48 hours prior to surgery. Men may shave face and neck.  Do not bring valuables to the hospital.  Contacts, dentures or bridgework may not be worn into surgery.  Leave suitcase in the car. After surgery it may be brought to your room.  For patients admitted to the hospital, checkout time is 11:00 AM the day of discharge.   Patients discharged the day of surgery will not be allowed to drive home.    Special Instructions: Shower using CHG night before surgery and shower the day of surgery use CHG.  Use special wash - you have one bottle of CHG for all showers.  You should use approximately 1/2 of the bottle for each shower.  How to Use Chlorhexidine for Bathing Chlorhexidine gluconate (CHG) is a germ-killing (antiseptic) solution that is used to clean the skin. It can get rid of the bacteria that normally live on the skin and can keep them away for about 24 hours. To clean your skin with CHG, you may be given: A CHG solution to use in the shower or as part of a sponge bath. A prepackaged cloth that contains CHG. Cleaning your skin with CHG may help lower the risk for infection: While you are staying in the intensive care unit of the hospital. If you have a vascular access, such as a central line, to provide short-term or long-term access to your veins. If you have a catheter to drain urine from your bladder. If you are on a ventilator. A ventilator is a machine that helps  you breathe by moving air in and out of your lungs. After surgery. What are the risks? Risks of using CHG include: A skin reaction. Hearing loss, if CHG gets in your ears and you have a perforated eardrum. Eye injury, if CHG gets in your eyes and is not rinsed out. The CHG product catching fire. Make sure that you avoid smoking and flames after applying CHG to your skin. Do not use CHG: If you have a chlorhexidine allergy or have previously reacted to chlorhexidine. On babies younger than 20 months of age. How to use CHG solution Use CHG only as told by your health care provider, and follow the instructions on the label. Use the full amount of CHG as directed. Usually, this is one bottle. During a shower Follow these steps when using CHG solution during a shower (unless your health care provider gives you different instructions): Start the shower. Use your normal soap and shampoo to wash your face and hair. Turn off the shower or move out of the shower stream. Pour the CHG onto a clean washcloth. Do not use any type of brush or rough-edged sponge. Starting at your neck, lather your body down to your toes. Make sure you follow these instructions: If you will be having surgery, pay special attention to the part  of your body where you will be having surgery. Scrub this area for at least 1 minute. Do not use CHG on your head or face. If the solution gets into your ears or eyes, rinse them well with water. Avoid your genital area. Avoid any areas of skin that have broken skin, cuts, or scrapes. Scrub your back and under your arms. Make sure to wash skin folds. Let the lather sit on your skin for 1-2 minutes or as long as told by your health care provider. Thoroughly rinse your entire body in the shower. Make sure that all body creases and crevices are rinsed well. Dry off with a clean towel. Do not put any substances on your body afterward--such as powder, lotion, or perfume--unless you are  told to do so by your health care provider. Only use lotions that are recommended by the manufacturer. Put on clean clothes or pajamas. If it is the night before your surgery, sleep in clean sheets.  During a sponge bath Follow these steps when using CHG solution during a sponge bath (unless your health care provider gives you different instructions): Use your normal soap and shampoo to wash your face and hair. Pour the CHG onto a clean washcloth. Starting at your neck, lather your body down to your toes. Make sure you follow these instructions: If you will be having surgery, pay special attention to the part of your body where you will be having surgery. Scrub this area for at least 1 minute. Do not use CHG on your head or face. If the solution gets into your ears or eyes, rinse them well with water. Avoid your genital area. Avoid any areas of skin that have broken skin, cuts, or scrapes. Scrub your back and under your arms. Make sure to wash skin folds. Let the lather sit on your skin for 1-2 minutes or as long as told by your health care provider. Using a different clean, wet washcloth, thoroughly rinse your entire body. Make sure that all body creases and crevices are rinsed well. Dry off with a clean towel. Do not put any substances on your body afterward--such as powder, lotion, or perfume--unless you are told to do so by your health care provider. Only use lotions that are recommended by the manufacturer. Put on clean clothes or pajamas. If it is the night before your surgery, sleep in clean sheets. How to use CHG prepackaged cloths Only use CHG cloths as told by your health care provider, and follow the instructions on the label. Use the CHG cloth on clean, dry skin. Do not use the CHG cloth on your head or face unless your health care provider tells you to. When washing with the CHG cloth: Avoid your genital area. Avoid any areas of skin that have broken skin, cuts, or  scrapes. Before surgery Follow these steps when using a CHG cloth to clean before surgery (unless your health care provider gives you different instructions): Using the CHG cloth, vigorously scrub the part of your body where you will be having surgery. Scrub using a back-and-forth motion for 3 minutes. The area on your body should be completely wet with CHG when you are done scrubbing. Do not rinse. Discard the cloth and let the area air-dry. Do not put any substances on the area afterward, such as powder, lotion, or perfume. Put on clean clothes or pajamas. If it is the night before your surgery, sleep in clean sheets.  For general bathing Follow these steps when using  CHG cloths for general bathing (unless your health care provider gives you different instructions). Use a separate CHG cloth for each area of your body. Make sure you wash between any folds of skin and between your fingers and toes. Wash your body in the following order, switching to a new cloth after each step: The front of your neck, shoulders, and chest. Both of your arms, under your arms, and your hands. Your stomach and groin area, avoiding the genitals. Your right leg and foot. Your left leg and foot. The back of your neck, your back, and your buttocks. Do not rinse. Discard the cloth and let the area air-dry. Do not put any substances on your body afterward--such as powder, lotion, or perfume--unless you are told to do so by your health care provider. Only use lotions that are recommended by the manufacturer. Put on clean clothes or pajamas. Contact a health care provider if: Your skin gets irritated after scrubbing. You have questions about using your solution or cloth. You swallow any chlorhexidine. Call your local poison control center (1-(919)163-2218 in the [place] Get help right away if: Your eyes itch badly, or they become very red or swollen. Your skin itches badly and is red or swollen. Your hearing  changes. You have trouble seeing. You have swelling or tingling in your mouth or throat. You have trouble breathing. These symptoms may represent a serious problem that is an emergency. Do not wait to see if the symptoms will go away. Get medical help right away. Call your local emergency services (911 in the [place] Do not drive yourself to the hospital. Summary Chlorhexidine gluconate (CHG) is a germ-killing (antiseptic) solution that is used to clean the skin. Cleaning your skin with CHG may help to lower your risk for infection. You may be given CHG to use for bathing. It may be in a bottle or in a prepackaged cloth to use on your skin. Carefully follow your health care provider's instructions and the instructions on the product label. Do not use CHG if you have a chlorhexidine allergy. Contact your health care provider if your skin gets irritated after scrubbing. This information is not intended to replace advice given to you by your health care provider. Make sure you discuss any questions you have with your health care provider. Document Revised: 04/26/2020 Document Reviewed: 04/26/2020 Elsevier Patient Education  2022 [place]. Total Knee Replacement, Care After This sheet gives you information about how to care for yourself after your procedure. Your health care provider may also give you more specific instructions. If you have problems or questions, contact your health care provider. What can I expect after the procedure? After the procedure, it is common to have: Redness, pain, and swelling at the incision area. Stiffness. Discomfort. A small amount of blood or clear fluid coming from your incision. Follow these instructions at home: Medicines Take over-the-counter and prescription medicines only as told by your health care provider. If you were prescribed a blood thinner (anticoagulant), take it as told by your health care provider. Ask your health care provider if the  medicine prescribed to you: Requires you to avoid driving or using machinery. Can cause constipation. You may need to take these actions to prevent or treat constipation: Drink enough fluid to keep your urine pale yellow. Take over-the-counter or prescription medicines. Eat foods that are high in fiber, such as beans, whole grains, and fresh fruits and vegetables. Limit foods that are high in fat and processed sugars, such  as fried or sweet foods. Incision care  Follow instructions from your health care provider about how to take care of your incision. Make sure you: Wash your hands with soap and water for at least 20 seconds before and after you change your bandage (dressing). If soap and water are not available, use hand sanitizer. Change your dressing as told by your health care provider. Leave stitches (sutures), staples, skin glue, or adhesive strips in place. These skin closures may need to stay in place for 2 weeks or longer. If adhesive strip edges start to loosen and curl up, you may trim the loose edges. Do not remove adhesive strips completely unless your health care provider tells you to do that. Do not take baths, swim, or use a hot tub until your health care provider approves. Check your incision area every day for signs of infection. Check for: More redness, swelling, or pain. More fluid or blood. Warmth. Pus or a bad smell. Activity Rest as told by your health care provider. Avoid sitting for a long time without moving. Get up to take short walks every 1-2 hours. This is important to improve blood flow and breathing. Ask for help if you feel weak or unsteady. Follow instructions from your health care provider about using a walker, crutches, or a cane. You may use your legs to support (bear) your body weight as told by your health care provider. Follow instructions about how much weight you may safely support on your affected leg (weight-bearing restrictions). A physical  therapist may show you how to get out of a bed and chair and how to go up and down stairs. You will first do this with a walker, crutches, or a cane and then without any of these devices. Once you are able to walk without a limp, you may stop using a walker, crutches, or a cane. Do exercises as told by your health care provider or physical therapist. Avoid high-impact activities, including running, jumping rope, and doing jumping jacks. Do not play contact sports until your health care provider approves. Return to your normal activities as told by your health care provider. Ask your health care provider what activities are safe for you. Managing pain, stiffness, and swelling  If directed, put ice on your knee. To do this: Put ice in a plastic bag or use the icing device (cold flow pad) that you were given. Follow instructions from your health care provider about how to use the icing device. Place a towel between your skin and the bag or between your skin and the icing device. Leave the ice on for 20 minutes, 2-3 times a day. Remove the ice if your skin turns bright red. This is very important. If you cannot feel pain, heat, or cold, you have a greater risk of damage to the area. Move your toes often to reduce stiffness and swelling. Raise (elevate) your leg above the level of your heart while you are sitting or lying down. Use several pillows to keep your leg straight. Do not put a pillow just under the knee. If the knee is bent for a long time, this may lead to stiffness. Wear elastic knee support as told by your health care provider. Safety  To help prevent falls, keep floors clear of objects you may trip over. Place items that you may need within easy reach. Wear an apron or tool belt with pockets for carrying objects. This leaves your hands free to help with your balance. Ask your  health care provider when it is safe to drive. General instructions Wear compression stockings as told by  your health care provider. These stockings help to prevent blood clots and reduce swelling in your legs. Continue with breathing exercises. This helps prevent lung infection. Do not use any products that contain nicotine or tobacco. These products include cigarettes, chewing tobacco, and vaping devices, such as e-cigarettes. These can delay healing after surgery. If you need help quitting, ask your health care provider. Tell your health care provider if you plan to have dental work. Also: Tell your dentist about your joint replacement. Ask your health care provider if there are any special instructions you need to follow before having dental care and routine cleanings. Keep all follow-up visits. This is important. Contact a health care provider if: You have a fever or chills. You have a cough or feel short of breath. Your medicine is not controlling your pain. You have any of these signs of infection: More redness, swelling, or pain around your incision. More fluid or blood coming from your incision. Warmth coming from your incision. Pus or a bad smell coming from your incision. You fall. Get help right away if: You have severe pain. You have trouble breathing. You have chest pain. You have redness, swelling, pain, or warmth in your calf or leg. Your incision breaks open after sutures or staples are removed. These symptoms may represent a serious problem that is an emergency. Do not wait to see if the symptoms will go away. Get medical help right away. Call your local emergency services (911 in the [place] Do not drive yourself to the hospital. Summary After the procedure, it is common to have pain and swelling at the incision area, a small amount of blood or fluid coming from your incision, and stiffness. Follow instructions from your health care provider about how to take care of your incision. Use crutches, a walker, or a cane as told by your health care provider. This information is  not intended to replace advice given to you by your health care provider. Make sure you discuss any questions you have with your health care provider. Document Revised: 08/05/2019 Document Reviewed: 08/05/2019 Elsevier Patient Education  [place] Anesthesia, Adult, Care After This sheet gives you information about how to care for yourself after your procedure. Your health care provider may also give you more specific instructions. If you have problems or questions, contact your health care provider. What can I expect after the procedure? After the procedure, the following side effects are common: Pain or discomfort at the IV site. Nausea. Vomiting. Sore throat. Trouble concentrating. Feeling cold or chills. Feeling weak or tired. Sleepiness and fatigue. Soreness and body aches. These side effects can affect parts of the body that were not involved in surgery. Follow these instructions at home: For the time period you were told by your health care provider:  Rest. Do not participate in activities where you could fall or become injured. Do not drive or use machinery. Do not drink alcohol. Do not take sleeping pills or medicines that cause drowsiness. Do not make important decisions or sign legal documents. Do not take care of children on your own. Eating and drinking Follow any instructions from your health care provider about eating or drinking restrictions. When you feel hungry, start by eating small amounts of foods that are soft and easy to digest (bland), such as toast. Gradually return to your regular diet. Drink enough fluid to keep  your urine pale yellow. If you vomit, rehydrate by drinking water, juice, or clear broth. General instructions If you have sleep apnea, surgery and certain medicines can increase your risk for breathing problems. Follow instructions from your health care provider about wearing your sleep device: Anytime you are sleeping, including  during daytime naps. While taking prescription pain medicines, sleeping medicines, or medicines that make you drowsy. Have a responsible adult stay with you for the time you are told. It is important to have someone help care for you until you are awake and alert. Return to your normal activities as told by your health care provider. Ask your health care provider what activities are safe for you. Take over-the-counter and prescription medicines only as told by your health care provider. If you smoke, do not smoke without supervision. Keep all follow-up visits as told by your health care provider. This is important. Contact a health care provider if: You have nausea or vomiting that does not get better with medicine. You cannot eat or drink without vomiting. You have pain that does not get better with medicine. You are unable to pass urine. You develop a skin rash. You have a fever. You have redness around your IV site that gets worse. Get help right away if: You have difficulty breathing. You have chest pain. You have blood in your urine or stool, or you vomit blood. Summary After the procedure, it is common to have a sore throat or nausea. It is also common to feel tired. Have a responsible adult stay with you for the time you are told. It is important to have someone help care for you until you are awake and alert. When you feel hungry, start by eating small amounts of foods that are soft and easy to digest (bland), such as toast. Gradually return to your regular diet. Drink enough fluid to keep your urine pale yellow. Return to your normal activities as told by your health care provider. Ask your health care provider what activities are safe for you. This information is not intended to replace advice given to you by your health care provider. Make sure you discuss any questions you have with your health care provider. Document Revised: 10/30/2019 Document Reviewed: 05/29/2019 Elsevier  Patient Education  2022 [employer].

## 2021-02-03 ENCOUNTER — Ambulatory Visit (HOSPITAL_COMMUNITY): Payer: Medicare Other

## 2021-02-04 ENCOUNTER — Encounter (HOSPITAL_COMMUNITY)
Admission: RE | Admit: 2021-02-04 | Discharge: 2021-02-04 | Disposition: A | Discharge status: Home / Self Care | Payer: Medicare Other | Source: Ambulatory Visit | Attending: Orthopedic Surgery | Admitting: Orthopedic Surgery

## 2021-02-04 ENCOUNTER — Other Ambulatory Visit (HOSPITAL_COMMUNITY)
Admission: RE | Admit: 2021-02-04 | Discharge: 2021-02-04 | Disposition: A | Discharge status: Home / Self Care | Payer: Medicare Other | Source: Ambulatory Visit | Attending: Orthopedic Surgery | Admitting: Orthopedic Surgery

## 2021-02-04 VITALS — BP 112/75 | HR 74 | Temp 98.5°F | Resp 18 | Ht 70.0 in | Wt 170.0 lb

## 2021-02-04 DIAGNOSIS — Z20822 Contact with and (suspected) exposure to covid-19: Secondary | ICD-10-CM | POA: Diagnosis not present

## 2021-02-04 DIAGNOSIS — M1710 Unilateral primary osteoarthritis, unspecified knee: Secondary | ICD-10-CM | POA: Insufficient documentation

## 2021-02-04 DIAGNOSIS — Z01818 Encounter for other preprocedural examination: Secondary | ICD-10-CM | POA: Diagnosis present

## 2021-02-04 DIAGNOSIS — M1909 Primary osteoarthritis, other specified site: Secondary | ICD-10-CM | POA: Diagnosis not present

## 2021-02-04 LAB — CBC WITH DIFFERENTIAL/PLATELET
Abs Immature Granulocytes: 0.02 10*3/uL (ref 0.00–0.07)
Basophils Absolute: 0.1 10*3/uL (ref 0.0–0.1)
Basophils Relative: 1 %
Eosinophils Absolute: 0.4 10*3/uL (ref 0.0–0.5)
Eosinophils Relative: 5 %
HCT: 34.4 % — ABNORMAL LOW (ref 39.0–52.0)
Hemoglobin: 10.6 g/dL — ABNORMAL LOW (ref 13.0–17.0)
Immature Granulocytes: 0 %
Lymphocytes Relative: 19 %
Lymphs Abs: 1.6 10*3/uL (ref 0.7–4.0)
MCH: 23 pg — ABNORMAL LOW (ref 26.0–34.0)
MCHC: 30.8 g/dL (ref 30.0–36.0)
MCV: 74.8 fL — ABNORMAL LOW (ref 80.0–100.0)
Monocytes Absolute: 0.7 10*3/uL (ref 0.1–1.0)
Monocytes Relative: 8 %
Neutro Abs: 5.6 10*3/uL (ref 1.7–7.7)
Neutrophils Relative %: 67 %
Platelets: 380 10*3/uL (ref 150–400)
RBC: 4.6 MIL/uL (ref 4.22–5.81)
RDW: 17.7 % — ABNORMAL HIGH (ref 11.5–15.5)
WBC: 8.4 10*3/uL (ref 4.0–10.5)
nRBC: 0 % (ref 0.0–0.2)

## 2021-02-04 LAB — BASIC METABOLIC PANEL
Anion gap: 5 (ref 5–15)
BUN: 17 mg/dL (ref 8–23)
CO2: 24 mmol/L (ref 22–32)
Calcium: 8.8 mg/dL — ABNORMAL LOW (ref 8.9–10.3)
Chloride: 107 mmol/L (ref 98–111)
Creatinine, Ser: 0.85 mg/dL (ref 0.61–1.24)
GFR, Estimated: >60 mL/min (ref >60)
Glucose, Bld: 90 mg/dL (ref 70–99)
Potassium: 3.8 mmol/L (ref 3.5–5.1)
Sodium: 136 mmol/L (ref 135–145)

## 2021-02-04 LAB — PREPARE RBC (CROSSMATCH)

## 2021-02-05 LAB — SARS CORONAVIRUS 2 (TAT 6-24 HRS): SARS Coronavirus 2: NEGATIVE

## 2021-02-07 NOTE — H&P (Signed)
Preop   OFFICE VISIT         Chief Complaint  Patient presents with   Knee Pain      Preop total knee replacement     70 year old male chronic pain right knee    He had another injury to the right knee we tried to treat him conservatively with aspiration injection he did not improve we put him on anti-inflammatories still no improvement   He had an MRI of his knee severe arthritis along with a torn meniscus   At his age of 61 the recommended treatment at this point is to go ahead and replace the knee as long as he is excepted the risks and benefits of this versus arthroscopic surgery and then another knee surgery later which she has agreed to the knee replacement now   His primary complaint is severe pain and loss of function       ROS: Mr. Vanaman has no acute problems under review of systems   BP 127/67   Pulse 73   Ht 5\' 10"  (1.778 m)   Wt 170 lb (77.1 kg)   BMI 24.39 kg/m    Body mass index is 24.39 kg/m.   General appearance: Well-developed well-nourished no gross deformities  Cardiovascular normal pulse and perfusion normal color without edema  Neurologically no sensation loss or deficits or pathologic reflexes   Psychological: Awake alert and oriented x3 mood and affect normal   Skin no lacerations or ulcerations no nodularity no palpable masses, no erythema or nodularity   Musculoskeletal:    Right knee has a flexion contracture 5 degrees Tenderness lateral compartment He has 125 degree knee flexion No ligament laxity Muscle tone normal       Past Medical History:  Diagnosis Date   Anxiety     Arthritis     Depression     History of kidney stones     Hyperlipidemia     Inguinal hernia right     Insomnia     Myalgia     PPD positive, treated 1979    1 year of INH   Raynaud phenomenon     Sarcoidosis 1980           Past Surgical History:  Procedure Laterality Date   AXILLARY LYMPH NODE BIOPSY Right 1980   COLONOSCOPY   02/08/2011     Procedure: COLONOSCOPY;  Surgeon: Daneil Dolin, MD;  Location: [facility];  Service: Endoscopy;  Laterality: N/A;  1:30 PM   INGUINAL HERNIA REPAIR Right 01/29/2017    Procedure: HERNIA REPAIR INGUINAL ADULT WITH MESH;  Surgeon: Virl Cagey, MD;  Location: [facility];  Service: General;  Laterality: Right;   KNEE ARTHROSCOPY WITH MEDIAL MENISECTOMY Right 04/05/2016    Procedure: RIGHT KNEE ARTHROSCOPY WITH PARTIAL  LATERAL MENISECTOMY, DEBRIDEMENT MEDIAL MENISCUS, ACL DEBRIDEMENT, MICRO FRACTURE OF FEMUR;  Surgeon: Carole Civil, MD;  Location: [facility];  Service: Orthopedics;  Laterality: Right;   KNEE SURGERY        left   KYPHOPLASTY       TONSILLECTOMY               Family History  Problem Relation Age of Onset   Suicidality Father     Colon cancer Neg Hx     Liver disease Neg Hx     Inflammatory bowel disease Neg Hx     Anesthesia problems Neg Hx      Social History  Tobacco Use   Smoking status: Former      Packs/day: 0.50      Years: 20.00      Pack years: 10.00      Types: Pipe, Cigarettes      Quit date: 04/03/1990      Years since quitting: 30.8   Smokeless tobacco: Never   Tobacco comments:      quit many years ago  Vaping Use   Vaping Use: Never used  Substance Use Topics   Alcohol use: Yes      Comment: occassional   Drug use: No           Allergies  Allergen Reactions   Sulfa Antibiotics Rash      Childhood       Active Medications      Current Meds  Medication Sig   acetaminophen (TYLENOL) 325 MG tablet Take 2 tablets (650 mg total) by mouth every 6 (six) hours as needed for fever, mild pain or headache.   albuterol (VENTOLIN HFA) 108 (90 Base) MCG/ACT inhaler 2 puffs every 4 (four) hours as needed for wheezing or shortness of breath.   ALPRAZolam (XANAX) 0.5 MG tablet Take 1 tablet (0.5 mg total) by mouth daily as needed for anxiety.   aspirin 325 MG tablet Take 325 mg by mouth daily as needed for mild pain.   atorvastatin  (LIPITOR) 40 MG tablet Take 40 mg daily at 6 PM by mouth.    busPIRone (BUSPAR) 10 MG tablet Take 10 mg by mouth 3 (three) times daily.   Calcium Citrate-Vitamin D (CALCIUM + D PO) Take 2 tablets by mouth daily.   diclofenac (CATAFLAM) 50 MG tablet Take 1 tablet (50 mg total) by mouth 2 (two) times daily as needed (arthritis pain).   DULoxetine (CYMBALTA) 60 MG capsule Take 1 capsule (60 mg total) by mouth daily. Total of 90 mg daily (60 mg + 30 mg)   fluticasone-salmeterol (ADVAIR) 250-50 MCG/ACT AEPB Inhale 1 puff into the lungs in the morning and at bedtime.   HYDROcodone-acetaminophen (NORCO) 7.5-325 MG tablet Take 1 tablet by mouth 2 (two) times daily.   Misc Natural Products (GLUCOSAMINE CHOND DOUBLE STR PO) Take 2 tablets by mouth daily.   Multiple Vitamin (MULTIVITAMIN) tablet Take 1 tablet by mouth daily. Men's 50+   Multiple Vitamins-Minerals (EQ VISION FORMULA 50+ PO) Take 1 tablet by mouth daily.   polyethylene glycol (MIRALAX / GLYCOLAX) 17 g packet Take 17 g by mouth daily as needed for mild constipation.   tiZANidine (ZANAFLEX) 4 MG tablet Take 4 mg by mouth 3 (three) times daily as needed for muscle spasms.   traZODone (DESYREL) 50 MG tablet Take 50 mg by mouth at bedtime.       Current Facility-Administered Medications for the 02/02/21 encounter (Office Visit) with Carole Civil, MD  Medication   bupivacaine-meloxicam ER (ZYNRELEF) injection 400 mg          MEDICAL DECISION MAKING   A.      Encounter Diagnosis  Name Primary?   Primary localized osteoarthritis of knee Yes      B. DATA ANALYSED:   IMAGING:   XRAYS: Right knee acute pain   AP of both knees sunrise and lateral right knee   Overall alignment shows slight increase in valgus   Lateral compartment arthrosis moderate narrowing secondary bone changes are mild to moderate as well   No acute fracture   Impression osteoarthritis lateral compartment right knee  MRI  IMPRESSION: 1. Severe  attenuation of the body and posterior horn of the medial meniscus likely reflecting prior meniscectomy. 2. Severe attenuation of the body and posterior horn of the lateral meniscus with irregularity of the remaining lateral meniscus likely reflecting sequela of prior meniscectomy. Horizontal tear of the anterior horn of the lateral meniscus extending to the superior articular surface. 3. Tricompartmental cartilage abnormalities as described above. 4. Intact ACL with mucinous degeneration.     Electronically Signed   By: Kathreen Devoid M.D.   On: 01/05/2021 09:25    Orders: SURGERY   C. MANAGEMENT    The procedure has been fully reviewed with the patient; The risks and benefits of surgery have been discussed and explained and understood. Alternative treatment has also been reviewed, questions were encouraged and answered. The postoperative plan is also been reviewed.   RIGHT TKA        No orders of the defined types were placed in this encounter.       Arther Abbott, MD

## 2021-02-08 ENCOUNTER — Encounter (HOSPITAL_COMMUNITY)
Admission: RE | Disposition: A | Discharge status: Home / Self Care | Payer: Self-pay | Source: Home / Self Care | Attending: Orthopedic Surgery

## 2021-02-08 ENCOUNTER — Encounter (HOSPITAL_COMMUNITY): Payer: Self-pay | Admitting: Orthopedic Surgery

## 2021-02-08 ENCOUNTER — Observation Stay (HOSPITAL_COMMUNITY)
Admission: RE | Admit: 2021-02-08 | Discharge: 2021-02-09 | Disposition: A | Discharge status: Home / Self Care | Payer: Medicare Other | Attending: Orthopedic Surgery | Admitting: Orthopedic Surgery

## 2021-02-08 ENCOUNTER — Ambulatory Visit (HOSPITAL_COMMUNITY): Payer: Medicare Other | Admitting: Anesthesiology

## 2021-02-08 ENCOUNTER — Other Ambulatory Visit: Payer: Self-pay

## 2021-02-08 ENCOUNTER — Ambulatory Visit (HOSPITAL_COMMUNITY): Payer: Medicare Other

## 2021-02-08 DIAGNOSIS — Z791 Long term (current) use of non-steroidal anti-inflammatories (NSAID): Secondary | ICD-10-CM | POA: Insufficient documentation

## 2021-02-08 DIAGNOSIS — Z87891 Personal history of nicotine dependence: Secondary | ICD-10-CM | POA: Diagnosis not present

## 2021-02-08 DIAGNOSIS — G8929 Other chronic pain: Secondary | ICD-10-CM | POA: Diagnosis not present

## 2021-02-08 DIAGNOSIS — Z7982 Long term (current) use of aspirin: Secondary | ICD-10-CM | POA: Insufficient documentation

## 2021-02-08 DIAGNOSIS — Z79899 Other long term (current) drug therapy: Secondary | ICD-10-CM | POA: Insufficient documentation

## 2021-02-08 DIAGNOSIS — M1711 Unilateral primary osteoarthritis, right knee: Principal | ICD-10-CM | POA: Diagnosis present

## 2021-02-08 DIAGNOSIS — Z96651 Presence of right artificial knee joint: Secondary | ICD-10-CM

## 2021-02-08 HISTORY — PX: TOTAL KNEE ARTHROPLASTY: SHX125

## 2021-02-08 LAB — ABO/RH: ABO/RH(D): O POS

## 2021-02-08 SURGERY — ARTHROPLASTY, KNEE, TOTAL
Anesthesia: Spinal | Site: Knee | Laterality: Right

## 2021-02-08 MED ORDER — MIDAZOLAM HCL 2 MG/2ML IJ SOLN
2.0000 mg | Freq: Once | INTRAMUSCULAR | Status: AC
  Administered 2021-02-08 (×2): 1 mg via INTRAVENOUS
  Filled 2021-02-08: qty 2

## 2021-02-08 MED ORDER — SODIUM CHLORIDE 0.9 % IV SOLN: INTRAVENOUS | Status: DC

## 2021-02-08 MED ORDER — ASPIRIN 325 MG PO TABS: 325.0000 mg | ORAL_TABLET | Freq: Every day | ORAL | Status: DC | PRN

## 2021-02-08 MED ORDER — DOCUSATE SODIUM 100 MG PO CAPS
100.0000 mg | ORAL_CAPSULE | Freq: Two times a day (BID) | ORAL | Status: DC
  Administered 2021-02-08 – 2021-02-09 (×3): 100 mg via ORAL
  Filled 2021-02-08 (×3): qty 1

## 2021-02-08 MED ORDER — FENTANYL CITRATE (PF) 100 MCG/2ML IJ SOLN
50.0000 ug | INTRAMUSCULAR | Status: DC | PRN
  Administered 2021-02-08: 50 ug via INTRAVENOUS
  Filled 2021-02-08: qty 2

## 2021-02-08 MED ORDER — ROCURONIUM BROMIDE 100 MG/10ML IV SOLN
INTRAVENOUS | Status: DC | PRN
  Administered 2021-02-08: 60 mg via INTRAVENOUS

## 2021-02-08 MED ORDER — LIDOCAINE HCL (PF) 1 % IJ SOLN
INTRAMUSCULAR | Status: AC
  Filled 2021-02-08: qty 30

## 2021-02-08 MED ORDER — PHENYLEPHRINE 40 MCG/ML (10ML) SYRINGE FOR IV PUSH (FOR BLOOD PRESSURE SUPPORT)
PREFILLED_SYRINGE | INTRAVENOUS | Status: AC
  Filled 2021-02-08: qty 10

## 2021-02-08 MED ORDER — LACTATED RINGERS IV SOLN: INTRAVENOUS | Status: DC

## 2021-02-08 MED ORDER — ORAL CARE MOUTH RINSE: 15.0000 mL | Freq: Once | OROMUCOSAL | Status: AC

## 2021-02-08 MED ORDER — METOCLOPRAMIDE HCL 5 MG PO TABS
5.0000 mg | ORAL_TABLET | Freq: Three times a day (TID) | ORAL | Status: DC | PRN
  Filled 2021-02-08: qty 2

## 2021-02-08 MED ORDER — ONDANSETRON HCL 4 MG/2ML IJ SOLN
INTRAMUSCULAR | Status: DC | PRN
  Administered 2021-02-08: 4 mg via INTRAVENOUS

## 2021-02-08 MED ORDER — ACETAMINOPHEN 325 MG PO TABS: 325.0000 mg | ORAL_TABLET | Freq: Four times a day (QID) | ORAL | Status: DC | PRN

## 2021-02-08 MED ORDER — CHLORHEXIDINE GLUCONATE CLOTH 2 % EX PADS
6.0000 | MEDICATED_PAD | Freq: Every day | CUTANEOUS | Status: DC
  Administered 2021-02-09: 08:00:00 6 via TOPICAL

## 2021-02-08 MED ORDER — TIZANIDINE HCL 4 MG PO TABS: 4.0000 mg | ORAL_TABLET | Freq: Three times a day (TID) | ORAL | Status: DC | PRN

## 2021-02-08 MED ORDER — DICLOFENAC POTASSIUM 50 MG PO TABS: 50.0000 mg | ORAL_TABLET | Freq: Two times a day (BID) | ORAL | Status: DC | PRN

## 2021-02-08 MED ORDER — LIDOCAINE HCL (PF) 1 % IJ SOLN
INTRAMUSCULAR | Status: DC | PRN
  Administered 2021-02-08: 3 mL

## 2021-02-08 MED ORDER — DEXAMETHASONE SODIUM PHOSPHATE 10 MG/ML IJ SOLN
10.0000 mg | Freq: Once | INTRAMUSCULAR | Status: AC
  Administered 2021-02-09: 08:00:00 10 mg via INTRAVENOUS
  Filled 2021-02-08: qty 1

## 2021-02-08 MED ORDER — ACETAMINOPHEN 325 MG PO TABS: 650.0000 mg | ORAL_TABLET | Freq: Four times a day (QID) | ORAL | Status: DC | PRN

## 2021-02-08 MED ORDER — PREGABALIN 50 MG PO CAPS
50.0000 mg | ORAL_CAPSULE | Freq: Once | ORAL | Status: AC
  Administered 2021-02-08: 50 mg via ORAL
  Filled 2021-02-08: qty 1

## 2021-02-08 MED ORDER — PHENOL 1.4 % MT LIQD: 1.0000 | OROMUCOSAL | Status: DC | PRN

## 2021-02-08 MED ORDER — CEFAZOLIN SODIUM-DEXTROSE 2-4 GM/100ML-% IV SOLN
2.0000 g | INTRAVENOUS | Status: AC
  Administered 2021-02-08: 2 g via INTRAVENOUS
  Filled 2021-02-08: qty 100

## 2021-02-08 MED ORDER — OCUVITE-LUTEIN PO CAPS
1.0000 | ORAL_CAPSULE | Freq: Every day | ORAL | Status: DC
  Administered 2021-02-08 – 2021-02-09 (×2): 1 via ORAL
  Filled 2021-02-08 (×2): qty 1

## 2021-02-08 MED ORDER — MOMETASONE FURO-FORMOTEROL FUM 200-5 MCG/ACT IN AERO: 2.0000 | INHALATION_SPRAY | Freq: Two times a day (BID) | RESPIRATORY_TRACT | Status: DC

## 2021-02-08 MED ORDER — BUPIVACAINE-MELOXICAM ER 400-12 MG/14ML IJ SOLN: 400.0000 mg | Freq: Once | INTRAMUSCULAR | Status: DC

## 2021-02-08 MED ORDER — FENTANYL CITRATE (PF) 100 MCG/2ML IJ SOLN
INTRAMUSCULAR | Status: DC | PRN
Start: 2021-02-08 — End: 2021-02-08
  Administered 2021-02-08: 50 ug via INTRAVENOUS
  Administered 2021-02-08 (×2): 25 ug via INTRAVENOUS
  Administered 2021-02-08: 100 ug via INTRAVENOUS

## 2021-02-08 MED ORDER — METOCLOPRAMIDE HCL 5 MG/ML IJ SOLN: 5.0000 mg | Freq: Three times a day (TID) | INTRAMUSCULAR | Status: DC | PRN

## 2021-02-08 MED ORDER — POLYETHYLENE GLYCOL 3350 17 G PO PACK: 17.0000 g | PACK | Freq: Every day | ORAL | Status: DC | PRN

## 2021-02-08 MED ORDER — TRAMADOL HCL 50 MG PO TABS
50.0000 mg | ORAL_TABLET | Freq: Four times a day (QID) | ORAL | Status: DC
  Administered 2021-02-08 – 2021-02-09 (×5): 50 mg via ORAL
  Filled 2021-02-08 (×5): qty 1

## 2021-02-08 MED ORDER — NAPROXEN 250 MG PO TABS
250.0000 mg | ORAL_TABLET | Freq: Two times a day (BID) | ORAL | Status: DC
  Administered 2021-02-08 – 2021-02-09 (×2): 250 mg via ORAL
  Filled 2021-02-08 (×2): qty 1

## 2021-02-08 MED ORDER — FENTANYL CITRATE (PF) 100 MCG/2ML IJ SOLN
INTRAMUSCULAR | Status: AC
  Filled 2021-02-08: qty 2

## 2021-02-08 MED ORDER — SENNOSIDES-DOCUSATE SODIUM 8.6-50 MG PO TABS: 1.0000 | ORAL_TABLET | Freq: Every evening | ORAL | Status: DC | PRN

## 2021-02-08 MED ORDER — HYDROCODONE-ACETAMINOPHEN 7.5-325 MG PO TABS: 1.0000 | ORAL_TABLET | ORAL | Status: DC | PRN

## 2021-02-08 MED ORDER — CHLORHEXIDINE GLUCONATE 0.12 % MT SOLN
15.0000 mL | Freq: Once | OROMUCOSAL | Status: AC
  Administered 2021-02-08: 15 mL via OROMUCOSAL

## 2021-02-08 MED ORDER — ROCURONIUM BROMIDE 10 MG/ML (PF) SYRINGE
PREFILLED_SYRINGE | INTRAVENOUS | Status: AC
  Filled 2021-02-08: qty 10

## 2021-02-08 MED ORDER — MENTHOL 3 MG MT LOZG: 1.0000 | LOZENGE | OROMUCOSAL | Status: DC | PRN

## 2021-02-08 MED ORDER — DIPHENHYDRAMINE HCL 12.5 MG/5ML PO ELIX: 12.5000 mg | ORAL_SOLUTION | ORAL | Status: DC | PRN

## 2021-02-08 MED ORDER — 0.9 % SODIUM CHLORIDE (POUR BTL) OPTIME
TOPICAL | Status: DC | PRN
  Administered 2021-02-08: 1000 mL

## 2021-02-08 MED ORDER — BUPIVACAINE HCL (PF) 0.25 % IJ SOLN
INTRAMUSCULAR | Status: AC
  Filled 2021-02-08: qty 30

## 2021-02-08 MED ORDER — PROPOFOL 10 MG/ML IV BOLUS
INTRAVENOUS | Status: AC
  Filled 2021-02-08: qty 20

## 2021-02-08 MED ORDER — POVIDONE-IODINE 10 % EX SWAB: 2.0000 "application " | Freq: Once | CUTANEOUS | Status: DC

## 2021-02-08 MED ORDER — ONDANSETRON HCL 4 MG/2ML IJ SOLN: 4.0000 mg | Freq: Once | INTRAMUSCULAR | Status: DC | PRN

## 2021-02-08 MED ORDER — BUPIVACAINE IN DEXTROSE 0.75-8.25 % IT SOLN
INTRATHECAL | Status: AC
  Filled 2021-02-08: qty 2

## 2021-02-08 MED ORDER — BUSPIRONE HCL 5 MG PO TABS
10.0000 mg | ORAL_TABLET | Freq: Three times a day (TID) | ORAL | Status: DC
  Administered 2021-02-08 – 2021-02-09 (×3): 10 mg via ORAL
  Filled 2021-02-08 (×3): qty 2

## 2021-02-08 MED ORDER — ASPIRIN 81 MG PO CHEW
81.0000 mg | CHEWABLE_TABLET | Freq: Two times a day (BID) | ORAL | Status: DC
  Administered 2021-02-08 – 2021-02-09 (×2): 81 mg via ORAL
  Filled 2021-02-08 (×2): qty 1

## 2021-02-08 MED ORDER — DEXAMETHASONE SODIUM PHOSPHATE 10 MG/ML IJ SOLN
INTRAMUSCULAR | Status: AC
  Filled 2021-02-08: qty 1

## 2021-02-08 MED ORDER — OXYCODONE HCL 5 MG PO TABS
5.0000 mg | ORAL_TABLET | Freq: Once | ORAL | Status: AC
  Administered 2021-02-08: 5 mg via ORAL
  Filled 2021-02-08: qty 1

## 2021-02-08 MED ORDER — DEXAMETHASONE SODIUM PHOSPHATE 4 MG/ML IJ SOLN
INTRAMUSCULAR | Status: AC
  Filled 2021-02-08: qty 2

## 2021-02-08 MED ORDER — ACETAMINOPHEN 500 MG PO TABS
500.0000 mg | ORAL_TABLET | Freq: Four times a day (QID) | ORAL | Status: AC
  Administered 2021-02-08 – 2021-02-09 (×4): 500 mg via ORAL
  Filled 2021-02-08 (×4): qty 1

## 2021-02-08 MED ORDER — ONDANSETRON HCL 4 MG/2ML IJ SOLN
INTRAMUSCULAR | Status: AC
  Filled 2021-02-08: qty 2

## 2021-02-08 MED ORDER — MEPERIDINE HCL 50 MG/ML IJ SOLN: 6.2500 mg | INTRAMUSCULAR | Status: DC | PRN

## 2021-02-08 MED ORDER — ALBUTEROL SULFATE HFA 108 (90 BASE) MCG/ACT IN AERS: 2.0000 | INHALATION_SPRAY | RESPIRATORY_TRACT | Status: DC | PRN

## 2021-02-08 MED ORDER — DEXAMETHASONE SODIUM PHOSPHATE 10 MG/ML IJ SOLN
INTRAMUSCULAR | Status: DC | PRN
  Administered 2021-02-08: 8 mg via INTRAVENOUS

## 2021-02-08 MED ORDER — MORPHINE SULFATE (PF) 2 MG/ML IV SOLN: 0.5000 mg | INTRAVENOUS | Status: DC | PRN

## 2021-02-08 MED ORDER — DULOXETINE HCL 30 MG PO CPEP
90.0000 mg | ORAL_CAPSULE | Freq: Every day | ORAL | Status: DC
  Administered 2021-02-08 – 2021-02-09 (×2): 90 mg via ORAL
  Filled 2021-02-08 (×2): qty 3

## 2021-02-08 MED ORDER — FLUMAZENIL 0.5 MG/5ML IV SOLN
INTRAVENOUS | Status: AC
  Filled 2021-02-08: qty 5

## 2021-02-08 MED ORDER — PROSIGHT PO TABS
1.0000 | ORAL_TABLET | Freq: Every day | ORAL | Status: DC
  Filled 2021-02-08 (×2): qty 1

## 2021-02-08 MED ORDER — OYSTER SHELL CALCIUM/D3 500-5 MG-MCG PO TABS
1.0000 | ORAL_TABLET | Freq: Every day | ORAL | Status: DC
  Administered 2021-02-08 – 2021-02-09 (×2): 1 via ORAL
  Filled 2021-02-08 (×2): qty 1

## 2021-02-08 MED ORDER — MIDAZOLAM HCL 2 MG/2ML IJ SOLN
INTRAMUSCULAR | Status: AC
  Filled 2021-02-08: qty 2

## 2021-02-08 MED ORDER — HYDROMORPHONE HCL 1 MG/ML IJ SOLN
0.2500 mg | INTRAMUSCULAR | Status: AC | PRN
  Administered 2021-02-08 (×4): 0.5 mg via INTRAVENOUS
  Filled 2021-02-08 (×4): qty 0.5

## 2021-02-08 MED ORDER — ADULT MULTIVITAMIN W/MINERALS CH
1.0000 | ORAL_TABLET | Freq: Every day | ORAL | Status: DC
  Administered 2021-02-08 – 2021-02-09 (×2): 1 via ORAL
  Filled 2021-02-08 (×2): qty 1

## 2021-02-08 MED ORDER — ATORVASTATIN CALCIUM 40 MG PO TABS
40.0000 mg | ORAL_TABLET | Freq: Every day | ORAL | Status: DC
  Administered 2021-02-08: 40 mg via ORAL
  Filled 2021-02-08: qty 1

## 2021-02-08 MED ORDER — LIDOCAINE HCL (PF) 2 % IJ SOLN
INTRAMUSCULAR | Status: AC
  Filled 2021-02-08: qty 15

## 2021-02-08 MED ORDER — HYDROCODONE-ACETAMINOPHEN 5-325 MG PO TABS: 1.0000 | ORAL_TABLET | ORAL | Status: DC | PRN

## 2021-02-08 MED ORDER — ONDANSETRON HCL 4 MG/2ML IJ SOLN: 4.0000 mg | Freq: Four times a day (QID) | INTRAMUSCULAR | Status: DC | PRN

## 2021-02-08 MED ORDER — ONDANSETRON HCL 4 MG/2ML IJ SOLN: 4.0000 mg | Freq: Once | INTRAMUSCULAR | Status: DC

## 2021-02-08 MED ORDER — LIDOCAINE HCL (CARDIAC) PF 50 MG/5ML IV SOSY
PREFILLED_SYRINGE | INTRAVENOUS | Status: DC | PRN
Start: 2021-02-08 — End: 2021-02-08
  Administered 2021-02-08: 100 mg via INTRAVENOUS

## 2021-02-08 MED ORDER — ONDANSETRON HCL 4 MG PO TABS: 4.0000 mg | ORAL_TABLET | Freq: Four times a day (QID) | ORAL | Status: DC | PRN

## 2021-02-08 MED ORDER — BUPIVACAINE HCL (PF) 0.25 % IJ SOLN
INTRAMUSCULAR | Status: DC | PRN
  Administered 2021-02-08: 30 mL via PERINEURAL

## 2021-02-08 MED ORDER — METHOCARBAMOL 1000 MG/10ML IJ SOLN
500.0000 mg | Freq: Once | INTRAVENOUS | Status: AC
  Administered 2021-02-08: 500 mg via INTRAVENOUS
  Filled 2021-02-08: qty 5

## 2021-02-08 MED ORDER — PHENYLEPHRINE HCL (PRESSORS) 10 MG/ML IV SOLN
INTRAVENOUS | Status: DC | PRN
  Administered 2021-02-08: 80 ug via INTRAVENOUS

## 2021-02-08 MED ORDER — SUGAMMADEX SODIUM 200 MG/2ML IV SOLN
INTRAVENOUS | Status: DC | PRN
  Administered 2021-02-08: 200 mg via INTRAVENOUS

## 2021-02-08 MED ORDER — SODIUM CHLORIDE 0.9 % IR SOLN
Status: DC | PRN
  Administered 2021-02-08: 3000 mL

## 2021-02-08 MED ORDER — BISACODYL 5 MG PO TBEC: 5.0000 mg | DELAYED_RELEASE_TABLET | Freq: Every day | ORAL | Status: DC | PRN

## 2021-02-08 MED ORDER — PANTOPRAZOLE SODIUM 40 MG PO TBEC
40.0000 mg | DELAYED_RELEASE_TABLET | Freq: Every day | ORAL | Status: DC
  Administered 2021-02-08 – 2021-02-09 (×2): 40 mg via ORAL
  Filled 2021-02-08 (×2): qty 1

## 2021-02-08 MED ORDER — BUPIVACAINE-MELOXICAM ER 200-6 MG/7ML IJ SOLN
INTRAMUSCULAR | Status: DC | PRN
  Administered 2021-02-08: 400 mg

## 2021-02-08 MED ORDER — TRANEXAMIC ACID-NACL 1000-0.7 MG/100ML-% IV SOLN
1000.0000 mg | INTRAVENOUS | Status: AC
  Administered 2021-02-08: 1000 mg via INTRAVENOUS
  Filled 2021-02-08: qty 100

## 2021-02-08 MED ORDER — TRAZODONE HCL 50 MG PO TABS
50.0000 mg | ORAL_TABLET | Freq: Every day | ORAL | Status: DC
  Administered 2021-02-08: 50 mg via ORAL
  Filled 2021-02-08: qty 1

## 2021-02-08 MED ORDER — ALPRAZOLAM 0.5 MG PO TABS: 0.5000 mg | ORAL_TABLET | Freq: Every day | ORAL | Status: DC | PRN

## 2021-02-08 MED ORDER — MOMETASONE FURO-FORMOTEROL FUM 200-5 MCG/ACT IN AERO
2.0000 | INHALATION_SPRAY | Freq: Two times a day (BID) | RESPIRATORY_TRACT | Status: DC
  Administered 2021-02-08 – 2021-02-09 (×2): 2 via RESPIRATORY_TRACT
  Filled 2021-02-08: qty 8.8

## 2021-02-08 MED ORDER — BUPIVACAINE-MELOXICAM ER 200-6 MG/7ML IJ SOLN
INTRAMUSCULAR | Status: AC
  Filled 2021-02-08: qty 2

## 2021-02-08 MED ORDER — TRANEXAMIC ACID-NACL 1000-0.7 MG/100ML-% IV SOLN
1000.0000 mg | Freq: Once | INTRAVENOUS | Status: AC
  Administered 2021-02-08: 1000 mg via INTRAVENOUS
  Filled 2021-02-08: qty 100

## 2021-02-08 MED ORDER — PROPOFOL 10 MG/ML IV BOLUS
INTRAVENOUS | Status: DC | PRN
  Administered 2021-02-08: 200 mg via INTRAVENOUS

## 2021-02-08 MED ORDER — ALUM & MAG HYDROXIDE-SIMETH 200-200-20 MG/5ML PO SUSP: 30.0000 mL | ORAL | Status: DC | PRN

## 2021-02-08 MED ORDER — CEFAZOLIN SODIUM-DEXTROSE 2-4 GM/100ML-% IV SOLN
2.0000 g | Freq: Four times a day (QID) | INTRAVENOUS | Status: AC
  Administered 2021-02-08 (×2): 2 g via INTRAVENOUS
  Filled 2021-02-08 (×2): qty 100

## 2021-02-08 MED ORDER — DEXAMETHASONE SODIUM PHOSPHATE 4 MG/ML IJ SOLN
INTRAMUSCULAR | Status: DC | PRN
  Administered 2021-02-08: 8 mg via PERINEURAL

## 2021-02-08 SURGICAL SUPPLY — 64 items
ATTUNE MED DOME PAT 41 KNEE (Knees) ×1 IMPLANT
ATTUNE MED DOME PAT 41MM KNEE (Knees) ×1 IMPLANT
ATTUNE PS FEM RT SZ 7 CEM KNEE (Femur) ×2 IMPLANT
BANDAGE ESMARK 6X9 LF (GAUZE/BANDAGES/DRESSINGS) ×1 IMPLANT
BASE TIBIAL CEM ATTUNE SZ 7 (Knees) ×3 IMPLANT
BASEPLATE TIB CEM ATTUNE SZ7 (Knees) IMPLANT
BLADE SAGITTAL 25.0X1.27X90 (BLADE) ×2 IMPLANT
BLADE SAGITTAL 25.0X1.27X90MM (BLADE) ×1
BLADE SAW SGTL 11.0X1.19X90.0M (BLADE) ×3 IMPLANT
BNDG CMPR 9X6 STRL LF SNTH (GAUZE/BANDAGES/DRESSINGS) ×1
BNDG CMPR STD VLCR NS LF 5.8X4 (GAUZE/BANDAGES/DRESSINGS) ×2
BNDG CMPR STD VLCR NS LF 5.8X6 (GAUZE/BANDAGES/DRESSINGS) ×1
BNDG ELASTIC 4X5.8 VLCR NS LF (GAUZE/BANDAGES/DRESSINGS) ×6 IMPLANT
BNDG ELASTIC 6X5.8 VLCR NS LF (GAUZE/BANDAGES/DRESSINGS) ×3 IMPLANT
BNDG ESMARK 6X9 LF (GAUZE/BANDAGES/DRESSINGS) ×3
BSPLAT TIB 7 CMNT FX BRNG STRL (Knees) ×1 IMPLANT
CEMENT HV SMART SET (Cement) ×6 IMPLANT
CLOTH BEACON ORANGE TIMEOUT ST (SAFETY) ×3 IMPLANT
COOLER ICEMAN CLASSIC (MISCELLANEOUS) ×3 IMPLANT
COVER LIGHT HANDLE STERIS (MISCELLANEOUS) ×6 IMPLANT
CUFF TOURN SGL QUICK 34 (TOURNIQUET CUFF) ×3
CUFF TRNQT CYL 34X4.125X (TOURNIQUET CUFF) ×1 IMPLANT
DRAPE BACK TABLE (DRAPES) ×3 IMPLANT
DRAPE EXTREMITY T 121X128X90 (DISPOSABLE) ×3 IMPLANT
DRESSING AQUACEL AG ADV 3.5X12 (MISCELLANEOUS) ×1 IMPLANT
DRSG AQUACEL AG ADV 3.5X12 (MISCELLANEOUS) ×3
DURAPREP 26ML APPLICATOR (WOUND CARE) ×6 IMPLANT
ELECT REM PT RETURN 9FT ADLT (ELECTROSURGICAL) ×3
ELECTRODE REM PT RTRN 9FT ADLT (ELECTROSURGICAL) ×1 IMPLANT
GLOVE SS N UNI LF 8.5 STRL (GLOVE) ×3 IMPLANT
GLOVE SURG POLYISO LF SZ8 (GLOVE) ×6 IMPLANT
GLOVE SURG UNDER POLY LF SZ7 (GLOVE) ×6 IMPLANT
GOWN STRL REUS W/TWL LRG LVL3 (GOWN DISPOSABLE) ×7 IMPLANT
GOWN STRL REUS W/TWL XL LVL3 (GOWN DISPOSABLE) ×3 IMPLANT
HANDPIECE INTERPULSE COAX TIP (DISPOSABLE) ×3
HOOD W/PEELAWAY (MISCELLANEOUS) ×10 IMPLANT
INSERT TIB ATTUNE FB SZ7X5 (Insert) ×2 IMPLANT
INST SET MAJOR BONE (KITS) ×3 IMPLANT
IV NS IRRIG 3000ML ARTHROMATIC (IV SOLUTION) ×3 IMPLANT
KIT BLADEGUARD II DBL (SET/KITS/TRAYS/PACK) ×3 IMPLANT
KIT TURNOVER KIT A (KITS) ×3 IMPLANT
MANIFOLD NEPTUNE II (INSTRUMENTS) ×3 IMPLANT
MARKER SKIN DUAL TIP RULER LAB (MISCELLANEOUS) ×3 IMPLANT
NS IRRIG 1000ML POUR BTL (IV SOLUTION) ×3 IMPLANT
PACK TOTAL JOINT (CUSTOM PROCEDURE TRAY) ×3 IMPLANT
PAD ARMBOARD 7.5X6 YLW CONV (MISCELLANEOUS) ×3 IMPLANT
PAD COLD SHLDR WRAP-ON (PAD) ×3 IMPLANT
PENCIL SMOKE EVACUATOR (MISCELLANEOUS) ×2 IMPLANT
PILLOW KNEE EXTENSION 0 DEG (MISCELLANEOUS) ×3 IMPLANT
PIN/DRILL PACK ORTHO 1/8X3.0 (PIN) ×3 IMPLANT
SAW OSC TIP CART 19.5X105X1.3 (SAW) ×3 IMPLANT
SET BASIN LINEN APH (SET/KITS/TRAYS/PACK) ×3 IMPLANT
SET HNDPC FAN SPRY TIP SCT (DISPOSABLE) ×1 IMPLANT
STAPLER VISISTAT 35W (STAPLE) ×3 IMPLANT
SUT BRALON NAB BRD #1 30IN (SUTURE) ×5 IMPLANT
SUT MNCRL 0 VIOLET CTX 36 (SUTURE) ×1 IMPLANT
SUT MON AB 0 CT1 (SUTURE) ×3 IMPLANT
SUT MONOCRYL 0 CTX 36 (SUTURE) ×3
SYR BULB IRRIG 60ML STRL (SYRINGE) ×3 IMPLANT
TOWEL OR 17X26 4PK STRL BLUE (TOWEL DISPOSABLE) ×3 IMPLANT
TOWER CARTRIDGE SMART MIX (DISPOSABLE) ×3 IMPLANT
TRAY FOLEY MTR SLVR 16FR STAT (SET/KITS/TRAYS/PACK) ×3 IMPLANT
WATER STERILE IRR 1000ML POUR (IV SOLUTION) ×6 IMPLANT
YANKAUER SUCT 12FT TUBE ARGYLE (SUCTIONS) ×3 IMPLANT

## 2021-02-08 NOTE — Anesthesia Procedure Notes (Signed)
Anesthesia Regional Block: Adductor canal block   Pre-Anesthetic Checklist: , timeout performed,  Correct Patient, Correct Site, Correct Laterality,  Correct Procedure, Correct Position, site marked,  Risks and benefits discussed,  At surgeon's request and post-op pain management  Laterality: Right  Prep: chloraprep       Needles:  Injection technique: Single-shot  Needle Type: Echogenic Stimulator Needle     Needle Length: 10cm  Needle Gauge: 20   Needle insertion depth: 6 cm   Additional Needles:   Procedures:, nerve stimulator,,, ultrasound used (permanent image in chart),,     Nerve Stimulator or Paresthesia:  Response: no Twitch elicited, 0.6 mA, 6 cm  Additional Responses:   Narrative:  Start time: 02/08/2021 7:15 AM End time: 02/08/2021 7:20 AM  Performed by: Personally  Anesthesiologist: Denese Killings, MD  Additional Notes: BP cuff, EKG monitors applied. Sedation begun. After nerve location anesthetic injected incrementally, slowly , and after neg aspirations. Tolerated well.

## 2021-02-08 NOTE — Op Note (Signed)
02/08/2021  10:18 AM  PATIENT:  [name]  70 y.o. male  PRE-OPERATIVE DIAGNOSIS:  right knee osteoarthritis  POST-OPERATIVE DIAGNOSIS:  right knee osteoarthritis  PROCEDURE:  Procedure(s): TOTAL KNEE ARTHROPLASTY (Right)  IMPLANTS:  DEPUY ATTUNE PS FIXED BEARING  50F 7T 5 POLY 41 X 11.5 PATELLA  BONE RESECTIONS  FEMUR 9 + 3  TIBIA 3 + 2 (MEDIAL REFERENCE)  PATELLA 26 - 12= 14    SURGEON:  Surgeon(s) and Role:    * Carole Civil, MD - Primary  PHYSICIAN ASSISTANT:   ASSISTANTS: Fulton Mole  ANESTHESIA:   Preop saphenous nerve block, attempted but unsuccessful spinal converted to general  EBL:  10 mL   BLOOD ADMINISTERED:none  DRAINS: none   LOCAL MEDICATIONS USED:  OTHER Zynrelef  SPECIMEN:  No Specimen  DISPOSITION OF SPECIMEN:  N/A  COUNTS:  YES  TOURNIQUET:   Total Tourniquet Time Documented: Thigh (Right) - 82 minutes Total: Thigh (Right) - 82 minutes   DICTATION: .Viviann Spare Dictation  PLAN OF CARE: Admit for overnight observation  PATIENT DISPOSITION:  PACU - hemodynamically stable.   Delay start of Pharmacological VTE agent (>24hrs) due to surgical blood loss or risk of bleeding: yes    Dictation for total knee replacement  02/08/2021  10:22 AM  Details of surgery: The patient was identified by 2 approved identification mechanisms. The operative extremity was evaluated and found to be acceptable for surgical treatment today. The chart was reviewed. The surgical site was confirmed and marked. The patient had a preop saphenous nerve block   The patient was taken to the operating room and given appropriate antibiotic 2 g Ancef. This is consistent with the SCIP protocol.  The patient was given the following anesthetic: Attempted spinal converted to general when the spinal was unsuccessful  The patient was then placed supine on the operating table. A Foley catheter was inserted. The operative extremity was prepped and draped  sterilely from the toes to the groin.  Timeout was executed confirming the patient's name, surgical site, antibiotic administration, x-rays available, and implants available.  The operative limb,  was exsanguinated with a six-inch Esmarch and the tourniquet was inflated to 280 mmHg.  A straight midline incision was made over the right KNEE and taken down to the extensor mechanism. A medial arthrotomy was performed. The patella was everted and the patellofemoral soft tissue was released, along with the patellar fat pad.  The anterior cruciate ligament and PCL were resected.  The anterior horns of the lateral and medial meniscus were resected. The medial soft tissue sleeve was elevated to the mid coronal plane.  A three-eighths inch drill bit was used to enter the femoral canal which was decompressed with suction and irrigation until clear.   The distal femoral cutting guide was set for 9 mm distal resection,  4valgus alignment, for a right knee.  The lateral femoral condyle was deficient and I dialed the distal cut up to a 12 to get some resection from the distal lateral femur.  The distal femur was resected and checked for flatness.  The Depuy Sigma sizing femoral guide was placed and the femur was sized to a size 7.   The external alignment guide for the tibial resection was then applied to the distal and proximal tibia and set for anatomic slope along with 3 MM resection  from the medial.   Rotational alignment was set using the malleolus, the tibial tubercle and the tibial spines.  The proximal tibia  was resected along with  residual menisci. The tibia was sized using a base plate to a size 7.   The extension gap was checked.  I assessed the extension gap and decided to take an additional 2 mm from the proximal tibia   A 4-in-1 cutting block was placed along with collateral ligament retractors and the distal femoral cuts were completed.  Spacer blocks were used to confirm equal flexion  extension gaps with releases done as needed. A size 5 MM spacer block gave equal stability and flexion extension.  The correct sized notch cutting guide for the femur was then applied and the notch cut was made.  Trial reduction was completed using size 7 femur, 7 tibia, 5 polyethylene insert trial implants. Patella tracking was normal  We then skeletonized the patella. It measured 26 in thickness and the patellar resection was set for 14 millimeters. the patellar resection was completed. The patella diameter measured 41. We then drilled the peg holes for the patella.  Completed patellar thickness was 14+11.5 or 25.5  The proximal tibia was prepared using the size 7 base plate.  Thorough irrigation was performed and the bone was dried and prepared for cement. The cement was mixed on the back table using third generation preparation techniques   The implants were then cemented in place and excess cement was removed. The cement was allowed to cure. Irrigation was repeated and excess and residual bone fragments and cement were removed.  The joint was dried completely and thoroughly and then's in relief 2 vials 400 mg placed in the joint and around the bone  The extensor mechanism was closed with #1 Bralon suture followed by subcutaneous tissue closure using 0 Monocryl suture in interrupted and running fashion   Skin approximation was performed using staples  A sterile dressing was applied, followed by a ace wrapped from foot to thigh and then a Cryo/Cuff which was activated   The patient was taken recovery room in stable condition

## 2021-02-08 NOTE — Interval H&P Note (Signed)
History and Physical Interval Note:  02/08/2021 7:33 AM  Albert Gonzalez  has presented today for surgery, with the diagnosis of right knee osteoarthritis.  The various methods of treatment have been discussed with the patient and family. After consideration of risks, benefits and other options for treatment, the patient has consented to  Procedure(s): TOTAL KNEE ARTHROPLASTY (Right) as a surgical intervention.  The patient's history has been reviewed, patient examined, no change in status, stable for surgery.  I have reviewed the patient's chart and labs.  Questions were answered to the patient's satisfaction.     Arther Abbott

## 2021-02-08 NOTE — Anesthesia Procedure Notes (Signed)
Date/Time: 02/08/2021 7:39 AM Performed by: Vista Deck, CRNA Pre-anesthesia Checklist: Patient identified, Emergency Drugs available, Suction available, Timeout performed and Patient being monitored Patient Re-evaluated:Patient Re-evaluated prior to induction Oxygen Delivery Method: Nasal Cannula

## 2021-02-08 NOTE — Anesthesia Procedure Notes (Signed)
Procedure Name: Intubation Date/Time: 02/08/2021 8:18 AM Performed by: Vista Deck, CRNA Pre-anesthesia Checklist: Patient identified, Patient being monitored, Timeout performed, Emergency Drugs available and Suction available Patient Re-evaluated:Patient Re-evaluated prior to induction Oxygen Delivery Method: Circle system utilized Preoxygenation: Pre-oxygenation with 100% oxygen Induction Type: IV induction Ventilation: Mask ventilation without difficulty Laryngoscope Size: Mac and 3 Grade View: Grade I Tube type: Oral Tube size: 7.5 mm Number of attempts: 1 Airway Equipment and Method: Stylet Placement Confirmation: ETT inserted through vocal cords under direct vision, positive ETCO2 and breath sounds checked- equal and bilateral Secured at: 22 cm Tube secured with: Tape Dental Injury: Teeth and Oropharynx as per pre-operative assessment

## 2021-02-08 NOTE — Anesthesia Postprocedure Evaluation (Signed)
Anesthesia Post Note  Patient: Albert Gonzalez  Procedure(s) Performed: TOTAL KNEE ARTHROPLASTY (Right: Knee)  Patient location during evaluation: PACU Anesthesia Type: Combined General/Spinal Level of consciousness: awake and alert and oriented Pain management: pain level controlled Vital Signs Assessment: post-procedure vital signs reviewed and stable Respiratory status: spontaneous breathing, nonlabored ventilation, respiratory function stable and patient connected to nasal cannula oxygen Cardiovascular status: blood pressure returned to baseline and stable Postop Assessment: no apparent nausea or vomiting Anesthetic complications: no   No notable events documented.   Last Vitals:  Vitals:   02/08/21 1145 02/08/21 1212  BP: (!) 150/91 (!) 141/97  Pulse: 87 85  Resp: 18 18  Temp: 36.8 C 36.7 C  SpO2: 99% 100%    Last Pain:  Vitals:   02/08/21 1212  TempSrc:   PainSc: 0-No pain                 Gerilynn Mccullars C Jyra Lagares

## 2021-02-08 NOTE — Evaluation (Signed)
Physical Therapy Evaluation Patient Details Name: [name] MRN: [id number] DOB: 07/24/1950 Today's Date: 02/08/2021  History of Present Illness  Albert Gonzalez  70 y.o. male with R knee OA, s/p R TKA 02/08/21. PMH: anxiety, sarcoidosis  Clinical Impression  Pt is s/p R TKA resulting in the deficits listed below (see PT Problem List). Pt reports being independent at baseline, ~3 falls due to getting tangled up but able to get back up without difficulty, still drives and ambulates community distances. Pt currently reports feeling "loopy" due to pain medication, slightly impulsive with mobility requiring heavy VC for safety. Pt ambulates 5 ft with RW min guard for safety, therapist managing all lines and VC as needed. Pt with R knee AROM ~ -15 - 80 deg, R quad activation noted, SLR performed with static ~15 deg bend, easily distracted with exercises requiring VC for technique. Pt motivated to return home with spouse who is unable to physically assist, but plans to sleep in living room to avoid stairs up to bedroom, has all DME and will begin OPPT at discharge. Pt will benefit from skilled PT to increase their independence and safety with mobility to allow discharge to the venue listed below.         Recommendations for follow up therapy are one component of a multi-disciplinary discharge planning process, led by the attending physician.  Recommendations may be updated based on patient status, additional functional criteria and insurance authorization.  Follow Up Recommendations Outpatient PT    Assistance Recommended at Discharge Frequent or constant Supervision/Assistance  Functional Status Assessment Patient has had a recent decline in their functional status and demonstrates the ability to make significant improvements in function in a reasonable and predictable amount of time.  Equipment Recommendations  None recommended by PT    Recommendations for Other Services       Precautions /  Restrictions Precautions Precautions: Fall Restrictions Weight Bearing Restrictions: No      Mobility  Bed Mobility Overal bed mobility: Needs Assistance Bed Mobility: Supine to Sit  Supine to sit: Supervision  General bed mobility comments: VC for hand placement, slightly impulsive, no physical assist    Transfers Overall transfer level: Needs assistance Equipment used: Rolling walker (2 wheels) Transfers: Sit to/from Stand Sit to Stand: Min guard  General transfer comment: slightly impulsive, VC for hand placement to power up and return to sitting    Ambulation/Gait Ambulation/Gait assistance: Min guard Gait Distance (Feet): 5 Feet Assistive device: Rolling walker (2 wheels) Gait Pattern/deviations: Step-to pattern;Decreased stride length;Decreased stance time - right Gait velocity: decreased  General Gait Details: step to pattern, decreased RLE stance time, pt reports feeling "loopy" from medication, slightly impulsive trying to lift and carry RW, heavy VC for RW management and safety with lines, no overt LOB  Stairs            Wheelchair Mobility    Modified Rankin (Stroke Patients Only)       Balance Overall balance assessment: Needs assistance Sitting-balance support: Feet supported Sitting balance-Leahy Scale: Good Sitting balance - Comments: seated EOB   Standing balance support: During functional activity;Bilateral upper extremity supported Standing balance-Leahy Scale: Poor Standing balance comment: reliant on UE support       Pertinent Vitals/Pain Pain Assessment: Faces Faces Pain Scale: Hurts a little bit Pain Location: R knee Pain Descriptors / Indicators: Discomfort;Sore Pain Intervention(s): Limited activity within patient's tolerance;Monitored during session;Repositioned;Ice applied    Home Living Family/patient expects to be discharged to:: Private  residence Living Arrangements: Spouse/significant other Available Help at Discharge:  Family (spouse present but unable to physically assist) Type of Home: House Home Access: Level entry (sidewalk to back door)  Alternate Level Stairs-Number of Steps: 10 Home Layout: Multi-level (pt reports will sleep on 2nd level) Home Equipment: BSC/3in1;Rolling Walker (2 wheels);Rollator (4 wheels)      Prior Function Prior Level of Function : Independent/Modified Independent  Mobility Comments: pt reports ind without AD, ~3 falls in last 6 months ADLs Comments: pt reports ind, drives     Hand Dominance        Extremity/Trunk Assessment   Upper Extremity Assessment Upper Extremity Assessment: Overall WFL for tasks assessed    Lower Extremity Assessment Lower Extremity Assessment: RLE deficits/detail;LLE deficits/detail RLE Deficits / Details: ankle and hip AROM WNL, knee AROM ~ -15-80, able to perform quad set, SLR with static ~15 degree bend throughout arc of motion RLE Sensation: decreased light touch (numbness over incision) RLE Coordination: WNL LLE Deficits / Details: AROM WNL, strength 4+/5    Cervical / Trunk Assessment Cervical / Trunk Assessment: Normal  Communication   Communication: No difficulties  Cognition Arousal/Alertness: Awake/alert Behavior During Therapy: WFL for tasks assessed/performed Overall Cognitive Status: Within Functional Limits for tasks assessed      General Comments      Exercises Total Joint Exercises Ankle Circles/Pumps: Seated;AROM;Both;15 reps Quad Sets: Seated;AROM;Strengthening;Both;15 reps (heavy VC for technique)   Assessment/Plan    PT Assessment Patient needs continued PT services  PT Problem List Decreased strength;Decreased range of motion;Decreased activity tolerance;Decreased balance;Decreased mobility;Decreased cognition;Decreased knowledge of use of DME;Decreased safety awareness;Pain       PT Treatment Interventions DME instruction;Gait training;Stair training;Functional mobility training;Therapeutic  activities;Therapeutic exercise;Balance training;Patient/family education    PT Goals (Current goals can be found in the Care Plan section)  Acute Rehab PT Goals Patient Stated Goal: home with family and outpatient PT PT Goal Formulation: With patient Time For Goal Achievement: 02/22/21 Potential to Achieve Goals: Good    Frequency 7X/week   Barriers to discharge        Co-evaluation               AM-PAC PT "6 Clicks" Mobility  Outcome Measure Help needed turning from your back to your side while in a flat bed without using bedrails?: A Little Help needed moving from lying on your back to sitting on the side of a flat bed without using bedrails?: A Little Help needed moving to and from a bed to a chair (including a wheelchair)?: A Little Help needed standing up from a chair using your arms (e.g., wheelchair or bedside chair)?: A Little Help needed to walk in hospital room?: A Little Help needed climbing 3-5 steps with a railing? : A Lot 6 Click Score: 17    End of Session Equipment Utilized During Treatment: Gait belt;Oxygen Activity Tolerance: Patient tolerated treatment well Patient left: in chair;with call bell/phone within reach;with chair alarm set Nurse Communication: Mobility status PT Visit Diagnosis: Pain;Other abnormalities of gait and mobility (R26.89);Difficulty in walking, not elsewhere classified (R26.2) Pain - Right/Left: Right Pain - part of body: Knee    Time: 1356-1430 PT Time Calculation (min) (ACUTE ONLY): 34 min   Charges:   PT Evaluation $PT Eval Low Complexity: 1 Low PT Treatments $Therapeutic Activity: 8-22 mins         Tori Lyndsy Gilberto PT, DPT 02/08/21, 2:48 PM

## 2021-02-08 NOTE — Anesthesia Procedure Notes (Addendum)
Spinal  Patient location during procedure: OR Start time: 02/08/2021 7:51 AM End time: 02/08/2021 8:12 AM Reason for block: surgical anesthesia Staffing Performed: anesthesiologist  Anesthesiologist: Denese Killings, MD Preanesthetic Checklist Completed: patient identified, IV checked, site marked, risks and benefits discussed, surgical consent, monitors and equipment checked, pre-op evaluation and timeout performed Spinal Block Patient position: sitting Prep: Betadine Patient monitoring: heart rate, cardiac monitor, continuous pulse ox and blood pressure Approach: midline Location: L4-5 Injection technique: single-shot Needle Needle type: Spinocan  Needle gauge: 24 G Needle length: 9 cm Additional Notes Spinal attempted ATX4, unable to enter the space. Spinal abandoned.

## 2021-02-08 NOTE — Brief Op Note (Signed)
02/08/2021  10:18 AM  PATIENT:  Albert Gonzalez  70 y.o. male  PRE-OPERATIVE DIAGNOSIS:  right knee osteoarthritis  POST-OPERATIVE DIAGNOSIS:  right knee osteoarthritis  PROCEDURE:  Procedure(s): TOTAL KNEE ARTHROPLASTY (Right)  IMPLANTS:  DEPUY ATTUNE PS FIXED BEARING  45F 7T 5 POLY 41 X 11.5 PATELLA  BONE RESECTIONS  FEMUR 9 + 3  TIBIA 3 + 2 (MEDIAL REFERENCE)  PATELLA 26 - 12= 14    SURGEON:  Surgeon(s) and Role:    * Carole Civil, MD - Primary  PHYSICIAN ASSISTANT:   ASSISTANTS: Fulton Mole  ANESTHESIA:    Preop saphenous nerve block, attempted but unsuccessful spinal converted to general  EBL:  10 mL   BLOOD ADMINISTERED:none  DRAINS: none   LOCAL MEDICATIONS USED:  OTHER Zynrelef  SPECIMEN:  No Specimen  DISPOSITION OF SPECIMEN:  N/A  COUNTS:  YES  TOURNIQUET:   Total Tourniquet Time Documented: Thigh (Right) - 82 minutes Total: Thigh (Right) - 82 minutes   DICTATION: .Viviann Spare Dictation  PLAN OF CARE: Admit for overnight observation  PATIENT DISPOSITION:  PACU - hemodynamically stable.   Delay start of Pharmacological VTE agent (>24hrs) due to surgical blood loss or risk of bleeding: yes

## 2021-02-08 NOTE — Anesthesia Preprocedure Evaluation (Signed)
Anesthesia Evaluation  Patient identified by MRN, date of birth, ID band Patient awake    Reviewed: Allergy & Precautions, H&P , NPO status , Patient's Chart, lab work & pertinent test results  Airway Mallampati: II  TM Distance: >3 FB Neck ROM: Full    Dental  (+) Dental Advisory Given, Teeth Intact   Pulmonary pneumonia, neg COPD,  COPD inhaler, former smoker,  Sarcoidosis     Pulmonary exam normal breath sounds clear to auscultation       Cardiovascular Exercise Tolerance: Good + Peripheral Vascular Disease (AAA - 4 CM)  Normal cardiovascular exam Rhythm:Regular Rate:Normal  09-Sep-2020 09:21:22 [facility] ROUTINE RECORD Nov 30, 1950 (35 yr) Male Caucasian Vent. rate 83 BPM PR interval 158 ms QRS duration 95 ms QT/QTcB 341/401 ms P-R-T axes 29 26 31  Sinus rhythm Confirmed by Fredia Sorrow [phone]) on 09/09/2020 9:49:36 AM   Neuro/Psych PSYCHIATRIC DISORDERS Anxiety Depression negative neurological ROS     GI/Hepatic negative GI ROS, Neg liver ROS,   Endo/Other  negative endocrine ROS  Renal/GU negative Renal ROS  negative genitourinary   Musculoskeletal  (+) Arthritis , Osteoarthritis,    Abdominal   Peds negative pediatric ROS (+)  Hematology negative hematology ROS (+)   Anesthesia Other Findings Raynaud's phenomenon   Reproductive/Obstetrics negative OB ROS                            Anesthesia Physical Anesthesia Plan  ASA: 2  Anesthesia Plan: General/Spinal   Post-op Pain Management: Regional block and Dilaudid IV   Induction:   PONV Risk Score and Plan: Ondansetron, Dexamethasone and Propofol infusion  Airway Management Planned: Nasal Cannula, Natural Airway and Simple Face Mask  Additional Equipment:   Intra-op Plan:   Post-operative Plan:   Informed Consent: I have reviewed the patients History and Physical, chart, labs and discussed  the procedure including the risks, benefits and alternatives for the proposed anesthesia with the patient or authorized representative who has indicated his/her understanding and acceptance.     Dental advisory given  Plan Discussed with: CRNA and Surgeon  Anesthesia Plan Comments: (Possible GA with airway was discussed.)       Anesthesia Quick Evaluation

## 2021-02-08 NOTE — Transfer of Care (Signed)
Immediate Anesthesia Transfer of Care Note  Patient: Albert Gonzalez  Procedure(s) Performed: TOTAL KNEE ARTHROPLASTY (Right: Knee)  Patient Location: PACU  Anesthesia Type:General  Level of Consciousness: awake and patient cooperative  Airway & Oxygen Therapy: Patient Spontanous Breathing and Patient connected to nasal cannula oxygen  Post-op Assessment: Report given to RN and Post -op Vital signs reviewed and stable  Post vital signs: Reviewed and stable  Last Vitals:  Vitals Value Taken Time  BP 153/92 02/08/21 1022  Temp 36.5 C 02/08/21 1022  Pulse 84 02/08/21 1026  Resp 22 02/08/21 1027  SpO2 93 % 02/08/21 1026  Vitals shown include unvalidated device data.  Last Pain:  Vitals:   02/08/21 0653  TempSrc: Oral  PainSc: 3       Patients Stated Pain Goal: 8 (81/84/03 7543)  Complications: No notable events documented.

## 2021-02-08 NOTE — Plan of Care (Signed)
°  Problem: Acute Rehab PT Goals(only PT should resolve) Goal: Pt Will Go Supine/Side To Sit Outcome: Progressing Flowsheets (Taken 02/08/2021 1452) Pt will go Supine/Side to Sit: with modified independence Goal: Patient Will Transfer Sit To/From Stand Outcome: Progressing Flowsheets (Taken 02/08/2021 1452) Patient will transfer sit to/from stand: with supervision Goal: Pt Will Transfer Bed To Chair/Chair To Bed Outcome: Progressing Flowsheets (Taken 02/08/2021 1452) Pt will Transfer Bed to Chair/Chair to Bed: with supervision Goal: Pt Will Ambulate Outcome: Progressing Flowsheets (Taken 02/08/2021 1452) Pt will Ambulate:  > 125 feet  with supervision  with rolling walker Goal: Pt/caregiver will Perform Home Exercise Program Outcome: Progressing Flowsheets (Taken 02/08/2021 1452) Pt/caregiver will Perform Home Exercise Program:  For increased ROM  For increased strengthening  For improved balance  With Supervision, verbal cues required/provided   Talbot Grumbling PT, DPT 02/08/21, 2:53 PM

## 2021-02-09 ENCOUNTER — Encounter (HOSPITAL_COMMUNITY): Payer: Self-pay | Admitting: Orthopedic Surgery

## 2021-02-09 DIAGNOSIS — M1711 Unilateral primary osteoarthritis, right knee: Secondary | ICD-10-CM | POA: Diagnosis not present

## 2021-02-09 LAB — BASIC METABOLIC PANEL
Anion gap: 8 (ref 5–15)
BUN: 19 mg/dL (ref 8–23)
CO2: 23 mmol/L (ref 22–32)
Calcium: 8.5 mg/dL — ABNORMAL LOW (ref 8.9–10.3)
Chloride: 106 mmol/L (ref 98–111)
Creatinine, Ser: 0.8 mg/dL (ref 0.61–1.24)
GFR, Estimated: >60 mL/min (ref >60)
Glucose, Bld: 149 mg/dL — ABNORMAL HIGH (ref 70–99)
Potassium: 4.1 mmol/L (ref 3.5–5.1)
Sodium: 137 mmol/L (ref 135–145)

## 2021-02-09 LAB — CBC
HCT: 27.4 % — ABNORMAL LOW (ref 39.0–52.0)
Hemoglobin: 8.3 g/dL — ABNORMAL LOW (ref 13.0–17.0)
MCH: 22.9 pg — ABNORMAL LOW (ref 26.0–34.0)
MCHC: 30.3 g/dL (ref 30.0–36.0)
MCV: 75.5 fL — ABNORMAL LOW (ref 80.0–100.0)
Platelets: 317 10*3/uL (ref 150–400)
RBC: 3.63 MIL/uL — ABNORMAL LOW (ref 4.22–5.81)
RDW: 17.5 % — ABNORMAL HIGH (ref 11.5–15.5)
WBC: 13.6 10*3/uL — ABNORMAL HIGH (ref 4.0–10.5)
nRBC: 0 % (ref 0.0–0.2)

## 2021-02-09 MED ORDER — DOCUSATE SODIUM 100 MG PO CAPS: 100.0000 mg | ORAL_CAPSULE | Freq: Two times a day (BID) | ORAL | 0 refills | Status: DC

## 2021-02-09 MED ORDER — ASPIRIN 81 MG PO CHEW
81.0000 mg | CHEWABLE_TABLET | Freq: Two times a day (BID) | ORAL | 0 refills | Status: AC
Start: 2021-02-09 — End: 2021-03-11

## 2021-02-09 MED ORDER — HYDROCODONE-ACETAMINOPHEN 7.5-325 MG PO TABS: 1.0000 | ORAL_TABLET | ORAL | 0 refills | Status: AC | PRN

## 2021-02-09 MED ORDER — TRAMADOL HCL 50 MG PO TABS: 50.0000 mg | ORAL_TABLET | Freq: Four times a day (QID) | ORAL | 0 refills | Status: DC

## 2021-02-09 NOTE — Progress Notes (Signed)
Physical Therapy Treatment Patient Details Name: Albert Gonzalez MRN: [id number] DOB: 11-Jun-1950 Today's Date: 02/09/2021   History of Present Illness Albert Gonzalez  70 y.o. male with R knee OA, s/p R TKA 02/08/21. PMH: anxiety, sarcoidosis    PT Comments    Patient demonstrating improving ROM lacking 15 to 115 in seated. He demonstrates antagic gait lacking TKE but is able to ambulate 150 feet without loss of balance. Began stair training which is tolerated well with step to pattern but he does require intermittent cueing for sequencing. Patient will benefit from continued skilled physical therapy in hospital and recommended venue below to increase strength, balance, endurance for safe ADLs and gait.    Recommendations for follow up therapy are one component of a multi-disciplinary discharge planning process, led by the attending physician.  Recommendations may be updated based on patient status, additional functional criteria and insurance authorization.  Follow Up Recommendations  Outpatient PT     Assistance Recommended at Discharge Frequent or constant Supervision/Assistance  Equipment Recommendations  None recommended by PT    Recommendations for Other Services       Precautions / Restrictions Precautions Precautions: Fall Restrictions Weight Bearing Restrictions: Yes RLE Weight Bearing: Weight bearing as tolerated     Mobility  Bed Mobility Overal bed mobility: Modified Independent Bed Mobility: Supine to Sit                Transfers Overall transfer level: Modified independent Equipment used: Rolling walker (2 wheels) Transfers: Sit to/from Stand                  Ambulation/Gait Ambulation/Gait assistance: Min guard Gait Distance (Feet): 150 Feet Assistive device: Rolling walker (2 wheels) Gait Pattern/deviations: Decreased stride length;Decreased stance time - right;Knee flexed in stance - right;Step-through pattern;Antalgic Gait velocity:  decreased     General Gait Details: ambulates with RW, lacking R TKE   Stairs Stairs: Yes Stairs assistance: Min guard Stair Management: Step to pattern Number of Stairs: 4 (x3) General stair comments: intermittent cueing for sequencing   Wheelchair Mobility    Modified Rankin (Stroke Patients Only)       Balance Overall balance assessment: Needs assistance Sitting-balance support: Feet supported Sitting balance-Leahy Scale: Normal Sitting balance - Comments: seated EOB   Standing balance support: During functional activity;Bilateral upper extremity supported Standing balance-Leahy Scale: Good Standing balance comment: with RW                            Cognition Arousal/Alertness: Awake/alert Behavior During Therapy: WFL for tasks assessed/performed Overall Cognitive Status: Within Functional Limits for tasks assessed                                          Exercises General Exercises - Lower Extremity Quad Sets: AROM;Both;10 reps;Supine Long Arc Quad: AROM;Right;10 reps;Seated Hip ABduction/ADduction: AROM;10 reps;Right;Supine;Seated Straight Leg Raises: AROM;Right;10 reps;Supine    General Comments        Pertinent Vitals/Pain Pain Assessment: 0-10 Pain Score: 4  Pain Location: R knee Pain Descriptors / Indicators: Discomfort;Sore Pain Intervention(s): Limited activity within patient's tolerance;Monitored during session;Repositioned    Home Living                          Prior Function  PT Goals (current goals can now be found in the care plan section) Acute Rehab PT Goals Patient Stated Goal: home with family and outpatient PT PT Goal Formulation: With patient Time For Goal Achievement: 02/22/21 Potential to Achieve Goals: Good Progress towards PT goals: Progressing toward goals    Frequency    7X/week      PT Plan Current plan remains appropriate    Co-evaluation               AM-PAC PT "6 Clicks" Mobility   Outcome Measure  Help needed turning from your back to your side while in a flat bed without using bedrails?: None Help needed moving from lying on your back to sitting on the side of a flat bed without using bedrails?: None Help needed moving to and from a bed to a chair (including a wheelchair)?: None Help needed standing up from a chair using your arms (e.g., wheelchair or bedside chair)?: None Help needed to walk in hospital room?: None Help needed climbing 3-5 steps with a railing? : A Little 6 Click Score: 23    End of Session Equipment Utilized During Treatment: Gait belt Activity Tolerance: Patient tolerated treatment well Patient left: in chair;with call bell/phone within reach;with chair alarm set Nurse Communication: Mobility status PT Visit Diagnosis: Pain;Other abnormalities of gait and mobility (R26.89);Difficulty in walking, not elsewhere classified (R26.2) Pain - Right/Left: Right Pain - part of body: Knee     Time: 8115-7262 PT Time Calculation (min) (ACUTE ONLY): 15 min  Charges:  $Therapeutic Activity: 8-22 mins                     11:44 AM, 02/09/21 Mearl Latin PT, DPT Physical Therapist at [facility]

## 2021-02-09 NOTE — Care Management Obs Status (Signed)
[place] NOTIFICATION   Patient Details  Name: Albert Gonzalez MRN: [id number] Date of Birth: Nov 22, 1950   Medicare Observation Status Notification Given:  Yes    [name] 02/09/2021, 1:05 PM

## 2021-02-09 NOTE — Progress Notes (Signed)
Physical Therapy Treatment Patient Details Name: Albert Gonzalez MRN: [id number] DOB: 01-20-1951 Today's Date: 02/09/2021   History of Present Illness TREVONN HALLUM  70 y.o. male with R knee OA, s/p R TKA 02/08/21. PMH: anxiety, sarcoidosis    PT Comments    Patient with R knee ROM lacking 15 to 100 in seated and supine. Patient does not require assist with bed mobility or transfers. Patient transfers to standing and ambulates 250 feet with use of RW without loss of balance but intermittent unsteadiness. Patient completes exercises in supine and seated and is educated on heel prop for extension ROM and maintaining knee in end range positions for ROM. Patient overall performing very well this morning. Patient will benefit from continued skilled physical therapy in hospital and recommended venue below to increase strength, balance, endurance for safe ADLs and gait.   Recommendations for follow up therapy are one component of a multi-disciplinary discharge planning process, led by the attending physician.  Recommendations may be updated based on patient status, additional functional criteria and insurance authorization.  Follow Up Recommendations  Outpatient PT     Assistance Recommended at Discharge Frequent or constant Supervision/Assistance  Equipment Recommendations  None recommended by PT    Recommendations for Other Services       Precautions / Restrictions Precautions Precautions: Fall Restrictions RLE Weight Bearing: Weight bearing as tolerated     Mobility  Bed Mobility Overal bed mobility: Modified Independent                  Transfers Overall transfer level: Modified independent Equipment used: Rolling walker (2 wheels) Transfers: Sit to/from Stand                  Ambulation/Gait Ambulation/Gait assistance: Min guard Gait Distance (Feet): 250 Feet Assistive device: Rolling walker (2 wheels) Gait Pattern/deviations: Decreased stride  length;Decreased stance time - right;Knee flexed in stance - right;Step-through pattern Gait velocity: decreased     General Gait Details: ambulates with RW, lacking R TKE   Stairs             Wheelchair Mobility    Modified Rankin (Stroke Patients Only)       Balance Overall balance assessment: Needs assistance Sitting-balance support: Feet supported Sitting balance-Leahy Scale: Normal Sitting balance - Comments: seated EOB   Standing balance support: During functional activity;Bilateral upper extremity supported Standing balance-Leahy Scale: Good Standing balance comment: with RW                            Cognition Arousal/Alertness: Awake/alert Behavior During Therapy: WFL for tasks assessed/performed Overall Cognitive Status: Within Functional Limits for tasks assessed                                          Exercises General Exercises - Lower Extremity Quad Sets: AROM;Both;10 reps;Supine Long Arc Quad: AROM;Right;10 reps;Seated Hip ABduction/ADduction: AROM;10 reps;Right;Supine;Seated Straight Leg Raises: AROM;Right;10 reps;Supine    General Comments        Pertinent Vitals/Pain Pain Assessment: 0-10 Pain Score: 4  Pain Location: R knee Pain Descriptors / Indicators: Discomfort;Sore Pain Intervention(s): Limited activity within patient's tolerance;Monitored during session;Repositioned    Home Living                          Prior Function  PT Goals (current goals can now be found in the care plan section) Acute Rehab PT Goals Patient Stated Goal: home with family and outpatient PT PT Goal Formulation: With patient Time For Goal Achievement: 02/22/21 Potential to Achieve Goals: Good Progress towards PT goals: Progressing toward goals    Frequency    7X/week      PT Plan Current plan remains appropriate    Co-evaluation              AM-PAC PT "6 Clicks" Mobility   Outcome  Measure  Help needed turning from your back to your side while in a flat bed without using bedrails?: None Help needed moving from lying on your back to sitting on the side of a flat bed without using bedrails?: None Help needed moving to and from a bed to a chair (including a wheelchair)?: None Help needed standing up from a chair using your arms (e.g., wheelchair or bedside chair)?: None Help needed to walk in hospital room?: A Little Help needed climbing 3-5 steps with a railing? : A Little 6 Click Score: 22    End of Session Equipment Utilized During Treatment: Gait belt Activity Tolerance: Patient tolerated treatment well Patient left: in chair;with call bell/phone within reach;with chair alarm set Nurse Communication: Mobility status PT Visit Diagnosis: Pain;Other abnormalities of gait and mobility (R26.89);Difficulty in walking, not elsewhere classified (R26.2) Pain - Right/Left: Right Pain - part of body: Knee     Time: 0742-0808 PT Time Calculation (min) (ACUTE ONLY): 26 min  Charges:  $Therapeutic Exercise: 8-22 mins $Therapeutic Activity: 8-22 mins                     8:46 AM, 02/09/21 Mearl Latin PT, DPT Physical Therapist at [facility]

## 2021-02-09 NOTE — TOC Transition Note (Signed)
Transition of Care [facility]) - CM/SW Discharge Note   Patient Details  Name: Albert Gonzalez MRN: [id number] Date of Birth: 26-Sep-1950  Transition of Care [facility]) CM/SW Contact:  Iona Beard, [place] Phone Number: 02/09/2021, 9:33 AM   Clinical Narrative:    CSW spoke with pt about PT recommendation for outpatient PT rather than [facility] services. Pt states that he would like to go to outpatient PT and has been provided with a list of exercises to work on at home. CSW updated Marjory Lies with [facility] that pt would like outpatient PT rather than HH. TOC signing off.   Final next level of care: OP Rehab Barriers to Discharge: Barriers Resolved   Patient Goals and CMS Choice Patient states their goals for this hospitalization and ongoing recovery are:: Return home CMS Medicare.gov Compare Post Acute Care list provided to:: Patient Choice offered to / list presented to : Patient  Discharge Placement                       Discharge Plan and Services                                     Social Determinants of Health (SDOH) Interventions     Readmission Risk Interventions No flowsheet data found.

## 2021-02-09 NOTE — Discharge Summary (Signed)
Physician Discharge Summary  Patient ID: Albert Gonzalez MRN: [id number] DOB/AGE: June 13, 1950 70 y.o.  Admit date: 02/08/2021 Discharge date: 02/09/2021  Admission Diagnoses: Osteoarthritis right knee  Discharge Diagnoses:  Principal Problem:   Osteoarthritis of right knee Active Problems:   Primary osteoarthritis of right knee   Discharged Condition: Stable  Hospital Course: Uncomplicated  Hospital day 1 surgery right knee General anesthesia preop saphenous nerve block, right total knee. He tolerated the surgery well and did exercises same day  Hospital day 2 continued with his exercise program, pain well controlled.  Did steps and had 115 degrees of knee flexion   Consults: None  Significant Diagnostic Studies: labs:  CBC Latest Ref Rng & Units 02/09/2021 02/04/2021 09/10/2020  WBC 4.0 - 10.5 K/uL 13.6(H) 8.4 1.8(L)  Hemoglobin 13.0 - 17.0 g/dL 8.3(L) 10.6(L) 9.8(L)  Hematocrit 39.0 - 52.0 % 27.4(L) 34.4(L) 31.8(L)  Platelets 150 - 400 K/uL 317 380 102(L)    BMP Latest Ref Rng & Units 02/09/2021 02/04/2021 09/10/2020  Glucose 70 - 99 mg/dL 149(H) 90 85  BUN 8 - 23 mg/dL 19 17 14   Creatinine 0.61 - 1.24 mg/dL 0.80 0.85 0.99  Sodium 135 - 145 mmol/L 137 136 132(L)  Potassium 3.5 - 5.1 mmol/L 4.1 3.8 3.4(L)  Chloride 98 - 111 mmol/L 106 107 102  CO2 22 - 32 mmol/L 23 24 22   Calcium 8.9 - 10.3 mg/dL 8.5(L) 8.8(L) 7.8(L)      Discharge Exam: Blood pressure (!) 105/58, pulse 82, temperature 97.6 F (36.4 C), resp. rate 16, height 5\' 10"  (1.778 m), weight 77.1 kg, SpO2 97 %. Extremities: Homans sign is negative, no sign of DVT Pulses:  L brachial 2+ R brachial 2+  L radial 2+ R radial 2+  L inguinal 2+ R inguinal 2+  L popliteal 2+ R popliteal 2+  L posterior tibial 2+ R posterior tibial 2+  L dorsalis pedis 2+ R dorsalis pedis 2+   Neurologic: Grossly normal  Disposition: Discharge disposition: 01-Home or Self Care       Discharge Instructions      Ambulatory referral to Physical Therapy   Complete by: As directed    Call MD / Call 911   Complete by: As directed    If you experience chest pain or shortness of breath, CALL 911 and be transported to the hospital emergency room.  If you develope a fever above 101 F, pus (white drainage) or increased drainage or redness at the wound, or calf pain, call your surgeon's office.   Constipation Prevention   Complete by: As directed    Drink plenty of fluids.  Prune juice may be helpful.  You may use a stool softener, such as Colace (over the counter) 100 mg twice a day.  Use MiraLax (over the counter) for constipation as needed.   Diet - low sodium heart healthy   Complete by: As directed    Discharge instructions   Complete by: As directed    Blue bone foam 30 minutes 3 times a day  CPM machine 0-90, 6 hours a day divided as tolerated  Weight-bear as tolerated  Exercises as instructed in the hospital continue  Keep all the dressings on until you see the doctor you can remove the Ace bandage after 1 week, if the leg starts to swell call the office to have TED hose ordered for the left leg   Increase activity slowly as tolerated   Complete by: As directed    Post-operative opioid  taper instructions:   Complete by: As directed    POST-OPERATIVE OPIOID TAPER INSTRUCTIONS: It is important to wean off of your opioid medication as soon as possible. If you do not need pain medication after your surgery it is ok to stop day one. Opioids include: Codeine, Hydrocodone(Norco, Vicodin), Oxycodone(Percocet, oxycontin) and hydromorphone amongst others.  Long term and even short term use of opiods can cause: Increased pain response Dependence Constipation Depression Respiratory depression And more.  Withdrawal symptoms can include Flu like symptoms Nausea, vomiting And more Techniques to manage these symptoms Hydrate well Eat regular healthy meals Stay active Use relaxation techniques(deep  breathing, meditating, yoga) Do Not substitute Alcohol to help with tapering If you have been on opioids for less than two weeks and do not have pain than it is ok to stop all together.  Plan to wean off of opioids This plan should start within one week post op of your joint replacement. Maintain the same interval or time between taking each dose and first decrease the dose.  Cut the total daily intake of opioids by one tablet each day Next start to increase the time between doses. The last dose that should be eliminated is the evening dose.         Allergies as of 02/09/2021       Reactions   Sulfa Antibiotics Rash   Childhood         Medication List     STOP taking these medications    acetaminophen 325 MG tablet Commonly known as: TYLENOL   aspirin 325 MG tablet Replaced by: aspirin 81 MG chewable tablet   diclofenac 50 MG tablet Commonly known as: CATAFLAM       TAKE these medications    albuterol 108 (90 Base) MCG/ACT inhaler Commonly known as: VENTOLIN HFA 2 puffs every 4 (four) hours as needed for wheezing or shortness of breath.   ALPRAZolam 0.5 MG tablet Commonly known as: XANAX Take 1 tablet (0.5 mg total) by mouth daily as needed for anxiety.   aspirin 81 MG chewable tablet Chew 1 tablet (81 mg total) by mouth 2 (two) times daily. Replaces: aspirin 325 MG tablet   atorvastatin 40 MG tablet Commonly known as: LIPITOR Take 40 mg daily at 6 PM by mouth.   busPIRone 10 MG tablet Commonly known as: BUSPAR Take 10 mg by mouth 3 (three) times daily.   CALCIUM + D PO Take 2 tablets by mouth daily.   docusate sodium 100 MG capsule Commonly known as: COLACE Take 1 capsule (100 mg total) by mouth 2 (two) times daily.   DULoxetine 60 MG capsule Commonly known as: CYMBALTA Take 1 capsule (60 mg total) by mouth daily. Total of 90 mg daily (60 mg + 30 mg)   EQ VISION FORMULA 50+ PO Take 1 tablet by mouth daily.   fluticasone-salmeterol 250-50  MCG/ACT Aepb Commonly known as: ADVAIR Inhale 1 puff into the lungs in the morning and at bedtime.   GLUCOSAMINE CHOND DOUBLE STR PO Take 2 tablets by mouth daily.   HYDROcodone-acetaminophen 7.5-325 MG tablet Commonly known as: NORCO Take 1 tablet by mouth every 4 (four) hours as needed for up to 5 days for moderate pain. What changed:  when to take this reasons to take this   multivitamin tablet Take 1 tablet by mouth daily. Men's 50+   polyethylene glycol 17 g packet Commonly known as: MIRALAX / GLYCOLAX Take 17 g by mouth daily as needed for mild constipation.  tiZANidine 4 MG tablet Commonly known as: ZANAFLEX Take 4 mg by mouth 3 (three) times daily as needed for muscle spasms.   traMADol 50 MG tablet Commonly known as: ULTRAM Take 1 tablet (50 mg total) by mouth every 6 (six) hours.   traZODone 50 MG tablet Commonly known as: DESYREL Take 50 mg by mouth at bedtime.         Signed: Arther Abbott 02/09/2021, 12:16 PM

## 2021-02-09 NOTE — Progress Notes (Signed)
Patient ID: Albert Gonzalez, male   DOB: 1950/03/09, 70 y.o.   MRN: [id number]  Pod 1 rt tka  BP (!) 105/58 (BP Location: Left Arm)    Pulse 82    Temp 97.6 F (36.4 C)    Resp 16    Ht 5\' 10"  (1.778 m)    Wt 77.1 kg    SpO2 97%    BMI 24.39 kg/m   CBC Latest Ref Rng & Units 02/09/2021 02/04/2021 09/10/2020  WBC 4.0 - 10.5 K/uL 13.6(H) 8.4 1.8(L)  Hemoglobin 13.0 - 17.0 g/dL 8.3(L) 10.6(L) 9.8(L)  Hematocrit 39.0 - 52.0 % 27.4(L) 34.4(L) 31.8(L)  Platelets 150 - 400 K/uL 317 380 102(L)   BMP Latest Ref Rng & Units 02/09/2021 02/04/2021 09/10/2020  Glucose 70 - 99 mg/dL 149(H) 90 85  BUN 8 - 23 mg/dL 19 17 14   Creatinine 0.61 - 1.24 mg/dL 0.80 0.85 0.99  Sodium 135 - 145 mmol/L 137 136 132(L)  Potassium 3.5 - 5.1 mmol/L 4.1 3.8 3.4(L)  Chloride 98 - 111 mmol/L 106 107 102  CO2 22 - 32 mmol/L 23 24 22   Calcium 8.9 - 10.3 mg/dL 8.5(L) 8.8(L) 7.36(L)    70 year old male status post right total knee doing well  Pain well controlled  Patient is advised not to put a pillow under his knee  He is neurovascular intact calf soft and supple [name]' sign negative  Anticipate discharge if he can clear stairs later today

## 2021-02-10 LAB — TYPE AND SCREEN
ABO/RH(D): O POS
Antibody Screen: NEGATIVE
Unit division: 0
Unit division: 0

## 2021-02-10 LAB — BPAM RBC
Blood Product Expiration Date: 202301032359
Blood Product Expiration Date: 202301102359
Unit Type and Rh: 5100
Unit Type and Rh: 5100

## 2021-02-14 ENCOUNTER — Encounter (HOSPITAL_COMMUNITY): Payer: Self-pay | Admitting: Physical Therapy

## 2021-02-14 ENCOUNTER — Ambulatory Visit (HOSPITAL_COMMUNITY): Payer: Medicare Other | Attending: Orthopedic Surgery | Admitting: Physical Therapy

## 2021-02-14 ENCOUNTER — Other Ambulatory Visit: Payer: Self-pay

## 2021-02-14 DIAGNOSIS — M25561 Pain in right knee: Secondary | ICD-10-CM | POA: Insufficient documentation

## 2021-02-14 DIAGNOSIS — R2689 Other abnormalities of gait and mobility: Secondary | ICD-10-CM | POA: Diagnosis present

## 2021-02-14 DIAGNOSIS — M1710 Unilateral primary osteoarthritis, unspecified knee: Secondary | ICD-10-CM | POA: Insufficient documentation

## 2021-02-14 DIAGNOSIS — M6281 Muscle weakness (generalized): Secondary | ICD-10-CM | POA: Insufficient documentation

## 2021-02-14 DIAGNOSIS — R29898 Other symptoms and signs involving the musculoskeletal system: Secondary | ICD-10-CM | POA: Diagnosis present

## 2021-02-14 NOTE — Therapy (Signed)
[name], [place], [address] Phone: [phone]   Fax:  [phone]  Physical Therapy Evaluation  Patient Details  Name: Albert Gonzalez MRN: [id number] Date of Birth: June 10, 1950 Referring Provider (PT): Arther Abbott MD   Encounter Date: 02/14/2021   PT End of Session - 02/14/21 1404     Visit Number 1    Number of Visits 16    Date for PT Re-Evaluation 04/11/21    Authorization Type Primary Medicare [place] MCR adv supplemental  (no auth)    Progress Note Due on Visit 10    PT Start Time 1407    PT Stop Time 1455    PT Time Calculation (min) 48 min    Activity Tolerance Patient tolerated treatment well    Behavior During Therapy [facility] for tasks assessed/performed             Past Medical History:  Diagnosis Date   Anxiety    Arthritis    Depression    History of kidney stones    Hyperlipidemia    Inguinal hernia right    Insomnia    Myalgia    PPD positive, treated 1979   1 year of INH   Raynaud phenomenon    Sarcoidosis 1980    Past Surgical History:  Procedure Laterality Date   AXILLARY LYMPH NODE BIOPSY Right 1980   COLONOSCOPY  02/08/2011   Procedure: COLONOSCOPY;  Surgeon: Daneil Dolin, MD;  Location: [facility];  Service: Endoscopy;  Laterality: N/A;  1:30 PM   INGUINAL HERNIA REPAIR Right 01/29/2017   Procedure: HERNIA REPAIR INGUINAL ADULT WITH MESH;  Surgeon: Virl Cagey, MD;  Location: [facility] ORS;  Service: General;  Laterality: Right;   KNEE ARTHROSCOPY WITH MEDIAL MENISECTOMY Right 04/05/2016   Procedure: RIGHT KNEE ARTHROSCOPY WITH PARTIAL  LATERAL MENISECTOMY, DEBRIDEMENT MEDIAL MENISCUS, ACL DEBRIDEMENT, MICRO FRACTURE OF FEMUR;  Surgeon: Carole Civil, MD;  Location: [facility] ORS;  Service: Orthopedics;  Laterality: Right;   KNEE SURGERY     left   KYPHOPLASTY     TONSILLECTOMY     TOTAL KNEE ARTHROPLASTY Right 02/08/2021   Procedure: TOTAL KNEE  ARTHROPLASTY;  Surgeon: Carole Civil, MD;  Location: [facility];  Service: Orthopedics;  Laterality: Right;    There were no vitals filed for this visit.    Subjective Assessment - 02/14/21 1408     Subjective Patient is a 70 y.o. male who presents to physical therapy s/p R TKA on 02/08/21. Patient did PT in hospital and has been working on the exercises at home that were given to him. Patient states his bandages have been really tight on his leg. His pain has been under control. He has been doing HEP. He has been using CPM. He has been using RW. His main goal is to get his motion back and be able to resume normal lifestyle.    Limitations Standing;Walking;House hold activities    Patient Stated Goals to get his motion back and be able to resume normal lifestyle.    Currently in Pain? Yes    Pain Score 2     Pain Location Knee    Pain Orientation Right    Pain Descriptors / Indicators Aching    Pain Type Surgical pain    Pain Onset In the past 7 days    Pain Frequency Constant    Aggravating Factors  weight bearing    Pain Relieving Factors  meds, movement                [facility] PT Assessment - 02/14/21 0001       Assessment   Medical Diagnosis s/p R TKA    Referring Provider (PT) Arther Abbott MD    Onset Date/Surgical Date 02/08/21    Next MD Visit 02/23/21    Prior Therapy acute      Precautions   Precautions None      Restrictions   Weight Bearing Restrictions No      Balance Screen   Has the patient fallen in the past 6 months No    Has the patient had a decrease in activity level because of a fear of falling?  No    Is the patient reluctant to leave their home because of a fear of falling?  No      Prior Function   Level of Independence Independent    Vocation Retired    Leisure walking      Cognition   Overall Cognitive Status Within Functional Limits for tasks assessed      Observation/Other Assessments   Observations ambulates with RW, ace  bandage over RLE; bruising throughout LE, waterproof bandage intact    Focus on Therapeutic Outcomes (FOTO)  44% function      ROM / Strength   AROM / PROM / Strength AROM;Strength      AROM   AROM Assessment Site Knee    Right/Left Knee Right    Right Knee Extension 8   lacking   Right Knee Flexion 88      Strength   Strength Assessment Site Hip;Knee;Ankle    Right/Left Hip Right;Left    Right Hip Flexion 4+/5    Left Hip Flexion 5/5    Right/Left Knee Right;Left    Right Knee Flexion 4+/5    Right Knee Extension 3/5   lacking TKE ROM   Left Knee Flexion 5/5    Left Knee Extension 5/5    Right/Left Ankle Right;Left    Right Ankle Dorsiflexion 5/5    Left Ankle Dorsiflexion 5/5      Palpation   Palpation comment good quad set      Transfers   Comments labored, relies LLE      Ambulation/Gait   Ambulation Distance (Feet) 100 Feet    Assistive device Rolling walker    Gait Pattern Right flexed knee in stance;Decreased stance time - right;Antalgic    Ambulation Surface Level;Indoor    Gait velocity decreased    Gait Comments RLE held in ext lacking TKE                        Objective measurements completed on examination: See above findings.       [place] Adult PT Treatment/Exercise - 02/14/21 0001       Exercises   Exercises Knee/Hip      Knee/Hip Exercises: Seated   Long Arc Quad Right;10 reps      Knee/Hip Exercises: Supine   Quad Sets 10 reps    Heel Slides Right;10 reps    Heel Slides Limitations 5-10 second holds    Straight Leg Raises Right;10 reps    Other Supine Knee/Hip Exercises hamstring sets into red ball 10 x 5 second holds, ankle pumps x 10                     PT Education - 02/14/21 1403  Education Details Patient educated on exam findings, POC, scope of PT, HEP, compression, elevation    Person(s) Educated Patient    Methods Explanation;Demonstration;Handout    Comprehension Verbalized  understanding;Returned demonstration              PT Short Term Goals - 02/14/21 1406       PT SHORT TERM GOAL #1   Title Patient will be independent with HEP in order to improve functional outcomes.    Time 4    Period Weeks    Status New    Target Date 03/14/21      PT SHORT TERM GOAL #2   Title Patient will report at least 25% improvement in symptoms for improved quality of life.    Time 4    Period Weeks    Status New    Target Date 03/14/21               PT Long Term Goals - 02/14/21 1406       PT LONG TERM GOAL #1   Title Patient will report at least 75% improvement in symptoms for improved quality of life.    Time 8    Period Weeks    Status New    Target Date 04/11/21      PT LONG TERM GOAL #2   Title Pt will demonstrate 5/5 strength of BLE grossly to improve functional mobility and return pt to PLOF.     Time 8    Period Weeks    Status New    Target Date 04/11/21      PT LONG TERM GOAL #3   Title Patient will improve FOTO score by at least 15 points in order to indicate improved tolerance to activity.    Time 8    Period Weeks    Status New    Target Date 04/11/21      PT LONG TERM GOAL #4   Title Patient will be able to navigate stairs with reciprocal pattern without compensation in order to demonstrate improved LE strength.    Time 8    Period Weeks    Status New    Target Date 04/11/21      PT LONG TERM GOAL #5   Title Patient will improve ROM for left knee extension/flexion to 0-120 degrees to improve squatting, and other functional mobility.    Time 8    Period Weeks    Status New    Target Date 04/11/21                    Plan - 02/14/21 1408     Clinical Impression Statement Patient is a 70 y.o. male who presents to physical therapy s/p R TKA on 02/08/21. He presents with pain limited deficits in R knee strength, ROM, endurance, gait, balance, and functional mobility with ADL. He is having to modify and restrict ADL  as indicated by FOTO score as well as subjective information and objective measures which is affecting overall participation. Reviewed HEP given in hospital and gave updated handouts as well as progressed HEP. Patient measured and given info for compression garments. Patient educated on importance of elevation for edema management. ROM improves to lacking 8 to 100 following exercises. Patient will benefit from skilled physical therapy in order to improve function and reduce impairment.    Personal Factors and Comorbidities Comorbidity 2;Time since onset of injury/illness/exacerbation    Comorbidities COPD, hx knee surgeries    Examination-Activity Limitations  Locomotion Level;Transfers;Caring for Others;Dressing;Hygiene/Grooming;Lift;Stand;Stairs;Squat    Examination-Participation Restrictions Meal Prep;Cleaning;Community Activity;Yard Work;Volunteer;Shop;Laundry    Stability/Clinical Decision Making Stable/Uncomplicated    Clinical Decision Making Low    Rehab Potential Good    PT Frequency 2x / week    PT Duration 8 weeks    PT Treatment/Interventions ADLs/Self Care Home Management;Aquatic Therapy;Cryotherapy;Electrical Stimulation;Moist Heat;Traction;Parrafin;Ultrasound;DME Instruction;Gait training;Stair training;Functional mobility training;Therapeutic activities;Therapeutic exercise;Balance training;Neuromuscular re-education;Patient/family education;Orthotic Fit/Training;Manual techniques;Manual lymph drainage;Compression bandaging;Scar mobilization;Passive range of motion;Dry needling;Energy conservation;Splinting;Taping;Joint Manipulations;Spinal Manipulations    PT Next Visit Plan R knee mobility and strengthening, edema management; progress as able    PT Home Exercise Plan quad set, SLR, heel slides, heel prop, LAQ, ankle pumps, elevation, compression    Consulted and Agree with Plan of Care Patient             Patient will benefit from skilled therapeutic intervention in order to  improve the following deficits and impairments:  Abnormal gait, Decreased range of motion, Difficulty walking, Decreased endurance, Decreased activity tolerance, Decreased balance, Pain, Impaired flexibility, Improper body mechanics, Decreased mobility, Increased edema, Decreased strength  Visit Diagnosis: Right knee pain, unspecified chronicity  Muscle weakness (generalized)  Other abnormalities of gait and mobility  Other symptoms and signs involving the musculoskeletal system     Problem List Patient Active Problem List   Diagnosis Date Noted   Osteoarthritis of right knee 02/08/2021   Sepsis due to pneumonia ([place]) 09/09/2020   Depression    Anxiety    Hyperlipidemia    Arthritis    Sarcoidosis    Right inguinal hernia    MDD (major depressive disorder), recurrent episode, moderate (HCC) 08/24/2016   Acute medial meniscus tear of left knee    Tear of lateral meniscus of right knee, current    Primary osteoarthritis of right knee    Deficiency of anterior cruciate ligament of right knee    Abnormal CT scan, colon 02/01/2011   Constipation 02/01/2011    3:06 PM, 02/14/21 Mearl Latin PT, DPT Physical Therapist at [facility] [place], [place], [address] Phone: [phone]   Fax:  [phone]  Name: NUNZIO BANET MRN: [id number] Date of Birth: [birth year]

## 2021-02-14 NOTE — Patient Instructions (Signed)
Access Code: [email or web address] URL: [email or web address] Date: 02/14/2021 Prepared by: [place]  Exercises Long Sitting Quad Set - 3-5 x daily - 7 x weekly - 10 reps - 10 second hold Active Straight Leg Raise with Quad Set - 3 x daily - 7 x weekly - 3 sets - 10 reps Supine Heel Slide with Strap (Mirrored) - 3-5 x daily - 7 x weekly - 10 reps - 5-10 second hold Seated Long Arc Quad - 1 x daily - 7 x weekly - 10 reps - 5 second hold Seated Ankle Pumps - 3-5 x daily - 7 x weekly - 20 reps Supine Knee Extension Mobilization with Weight - 3-5 x daily - 7 x weekly - 15 minute hold

## 2021-02-16 ENCOUNTER — Ambulatory Visit (HOSPITAL_COMMUNITY): Payer: Medicare Other | Admitting: Physical Therapy

## 2021-02-18 ENCOUNTER — Ambulatory Visit (HOSPITAL_COMMUNITY): Payer: Medicare Other

## 2021-02-22 DIAGNOSIS — Z96651 Presence of right artificial knee joint: Secondary | ICD-10-CM | POA: Insufficient documentation

## 2021-02-23 ENCOUNTER — Ambulatory Visit (HOSPITAL_COMMUNITY): Payer: Medicare Other | Admitting: Physical Therapy

## 2021-02-23 ENCOUNTER — Other Ambulatory Visit: Payer: Self-pay

## 2021-02-23 ENCOUNTER — Encounter (HOSPITAL_COMMUNITY): Payer: Self-pay | Admitting: Physical Therapy

## 2021-02-23 ENCOUNTER — Ambulatory Visit (INDEPENDENT_AMBULATORY_CARE_PROVIDER_SITE_OTHER): Payer: Medicare Other | Admitting: Orthopedic Surgery

## 2021-02-23 DIAGNOSIS — M25561 Pain in right knee: Secondary | ICD-10-CM | POA: Diagnosis not present

## 2021-02-23 DIAGNOSIS — R2689 Other abnormalities of gait and mobility: Secondary | ICD-10-CM

## 2021-02-23 DIAGNOSIS — Z96651 Presence of right artificial knee joint: Secondary | ICD-10-CM

## 2021-02-23 DIAGNOSIS — M6281 Muscle weakness (generalized): Secondary | ICD-10-CM

## 2021-02-23 DIAGNOSIS — R29898 Other symptoms and signs involving the musculoskeletal system: Secondary | ICD-10-CM

## 2021-02-23 NOTE — Progress Notes (Signed)
Chief Complaint  Patient presents with   Routine Post Op    DOS 02/08/21 S/p TKA RT    Encounter Diagnosis  Name Primary?   S/P total knee replacement, right 02/08/21 Yes   2 weeks postop after knee replacement  Wound check: Staples extracted no signs of infection  Current range of motion is 5-105  Recommend continued physical therapy  Follow-up in 3 weeks  Current medication Ultram Zanaflex Colace aspirin

## 2021-02-23 NOTE — Patient Instructions (Signed)
Access Code: [email or web address] URL: [email or web address] Date: 02/23/2021 Prepared by: Mitzi Hansen Jace Dowe  Exercises Heel Raises with Counter Support - 1-2 x daily - 7 x weekly - 20 reps Toe Raises with Counter Support - 1-2 x daily - 7 x weekly - 20 reps Standing Hip Abduction with Counter Support - 1-2 x daily - 7 x weekly - 2 sets - 10 reps Mini Squat with Counter Support - 1-2 x daily - 7 x weekly - 2 sets - 10 reps

## 2021-02-23 NOTE — Therapy (Signed)
[place], [place], [address] Phone: [phone]   Fax:  [phone]  Physical Therapy Treatment  Patient Details  Name: Albert Gonzalez MRN: [id number] Date of Birth: 1950-09-06 Referring Provider (PT): Arther Abbott MD   Encounter Date: 02/23/2021   PT End of Session - 02/23/21 1401     Visit Number 2    Number of Visits 16    Date for PT Re-Evaluation 04/11/21    Authorization Type Primary Medicare [place] MCR adv supplemental  (no auth)    Progress Note Due on Visit 10    PT Start Time 1401    PT Stop Time 1439    PT Time Calculation (min) 38 min    Activity Tolerance Patient tolerated treatment well    Behavior During Therapy WFL for tasks assessed/performed             Past Medical History:  Diagnosis Date   Anxiety    Arthritis    Depression    History of kidney stones    Hyperlipidemia    Inguinal hernia right    Insomnia    Myalgia    PPD positive, treated 1979   1 year of INH   Raynaud phenomenon    Sarcoidosis 1980    Past Surgical History:  Procedure Laterality Date   AXILLARY LYMPH NODE BIOPSY Right 1980   COLONOSCOPY  02/08/2011   Procedure: COLONOSCOPY;  Surgeon: Daneil Dolin, MD;  [facility];  Service: Endoscopy;  Laterality: N/A;  1:30 PM   INGUINAL HERNIA REPAIR Right 01/29/2017   Procedure: HERNIA REPAIR INGUINAL ADULT WITH MESH;  Surgeon: Virl Cagey, MD;  Location: [facility];  Service: General;  Laterality: Right;   KNEE ARTHROSCOPY WITH MEDIAL MENISECTOMY Right 04/05/2016   Procedure: RIGHT KNEE ARTHROSCOPY WITH PARTIAL  LATERAL MENISECTOMY, DEBRIDEMENT MEDIAL MENISCUS, ACL DEBRIDEMENT, MICRO FRACTURE OF FEMUR;  Surgeon: Carole Civil, MD;  Location: [facility];  Service: Orthopedics;  Laterality: Right;   KNEE SURGERY     left   KYPHOPLASTY     TONSILLECTOMY     TOTAL KNEE ARTHROPLASTY Right 02/08/2021   Procedure: TOTAL KNEE  ARTHROPLASTY;  Surgeon: Carole Civil, MD;  Location: [facility];  Service: Orthopedics;  Laterality: Right;    There were no vitals filed for this visit.   Subjective Assessment - 02/23/21 1402     Subjective Patient states he has been doing very well. They took out staples today. Hasnt been using the RW and has been walking good without it.    Limitations Standing;Walking;House hold activities    Patient Stated Goals to get his motion back and be able to resume normal lifestyle.    Currently in Pain? No/denies    Pain Onset In the past 7 days                               [facility] Adult PT Treatment/Exercise - 02/23/21 0001       Ambulation/Gait   Ambulation Distance (Feet) 100 Feet    Assistive device None    Gait Pattern Right flexed knee in stance;Decreased stance time - right;Antalgic    Ambulation Surface Level;Indoor    Gait Comments limited knee flexion/ext ROM      Knee/Hip Exercises: Aerobic   Recumbent Bike 5 minutes rocking for dynamic warm up and ROM      Knee/Hip  Exercises: Standing   Heel Raises Both;20 reps    Heel Raises Limitations TR x 20    Knee Flexion Both;2 sets;10 reps    Hip Abduction Both;2 sets;10 reps    Other Standing Knee Exercises mini squat 2x 10      Knee/Hip Exercises: Supine   Heel Slides Right;10 reps    Heel Slides Limitations 5-10 second holds    Straight Leg Raises Right;2 sets;10 reps    Straight Leg Raises Limitations with cueing for quad set    Knee Extension AROM    Knee Extension Limitations lacking 6    Knee Flexion AROM    Knee Flexion Limitations 112   improves to 119 following heel slides                    PT Education - 02/23/21 1401     Education Details HEP, exercise mechanics    Person(s) Educated Patient    Methods Explanation;Demonstration    Comprehension Verbalized understanding;Returned demonstration              PT Short Term Goals - 02/14/21 1406       PT SHORT  TERM GOAL #1   Title Patient will be independent with HEP in order to improve functional outcomes.    Time 4    Period Weeks    Status New    Target Date 03/14/21      PT SHORT TERM GOAL #2   Title Patient will report at least 25% improvement in symptoms for improved quality of life.    Time 4    Period Weeks    Status New    Target Date 03/14/21               PT Long Term Goals - 02/14/21 1406       PT LONG TERM GOAL #1   Title Patient will report at least 75% improvement in symptoms for improved quality of life.    Time 8    Period Weeks    Status New    Target Date 04/11/21      PT LONG TERM GOAL #2   Title Pt will demonstrate 5/5 strength of BLE grossly to improve functional mobility and return pt to PLOF.     Time 8    Period Weeks    Status New    Target Date 04/11/21      PT LONG TERM GOAL #3   Title Patient will improve FOTO score by at least 15 points in order to indicate improved tolerance to activity.    Time 8    Period Weeks    Status New    Target Date 04/11/21      PT LONG TERM GOAL #4   Title Patient will be able to navigate stairs with reciprocal pattern without compensation in order to demonstrate improved LE strength.    Time 8    Period Weeks    Status New    Target Date 04/11/21      PT LONG TERM GOAL #5   Title Patient will improve ROM for left knee extension/flexion to 0-120 degrees to improve squatting, and other functional mobility.    Time 8    Period Weeks    Status New    Target Date 04/11/21                   Plan - 02/23/21 1401     Personal Factors and Comorbidities Comorbidity  2;Time since onset of injury/illness/exacerbation    Comorbidities COPD, hx knee surgeries    Examination-Activity Limitations Locomotion Level;Transfers;Caring for Others;Dressing;Hygiene/Grooming;Lift;Stand;Stairs;Squat    Examination-Participation Restrictions Meal Prep;Cleaning;Community Activity;Yard Work;Volunteer;Shop;Laundry     Stability/Clinical Decision Making Stable/Uncomplicated    Rehab Potential Good    PT Frequency 2x / week    PT Duration 8 weeks    PT Treatment/Interventions ADLs/Self Care Home Management;Aquatic Therapy;Cryotherapy;Electrical Stimulation;Moist Heat;Traction;Parrafin;Ultrasound;DME Instruction;Gait training;Stair training;Functional mobility training;Therapeutic activities;Therapeutic exercise;Balance training;Neuromuscular re-education;Patient/family education;Orthotic Fit/Training;Manual techniques;Manual lymph drainage;Compression bandaging;Scar mobilization;Passive range of motion;Dry needling;Energy conservation;Splinting;Taping;Joint Manipulations;Spinal Manipulations    PT Next Visit Plan R knee mobility and strengthening, edema management; progress as able    PT Home Exercise Plan quad set, SLR, heel slides, heel prop, LAQ, ankle pumps, elevation, compression    Consulted and Agree with Plan of Care Patient             Patient will benefit from skilled therapeutic intervention in order to improve the following deficits and impairments:  Abnormal gait, Decreased range of motion, Difficulty walking, Decreased endurance, Decreased activity tolerance, Decreased balance, Pain, Impaired flexibility, Improper body mechanics, Decreased mobility, Increased edema, Decreased strength  Visit Diagnosis: Right knee pain, unspecified chronicity  Muscle weakness (generalized)  Other abnormalities of gait and mobility  Other symptoms and signs involving the musculoskeletal system     Problem List Patient Active Problem List   Diagnosis Date Noted   S/P total knee replacement, right 02/08/21 02/22/2021   Osteoarthritis of right knee 02/08/2021   Sepsis due to pneumonia ([place]) 09/09/2020   Depression    Anxiety    Hyperlipidemia    Arthritis    Sarcoidosis    Right inguinal hernia    MDD (major depressive disorder), recurrent episode, moderate (HCC) 08/24/2016   Acute medial meniscus  tear of left knee    Tear of lateral meniscus of right knee, current    Primary osteoarthritis of right knee    Deficiency of anterior cruciate ligament of right knee    Abnormal CT scan, colon 02/01/2011   Constipation 02/01/2011    2:41 PM, 02/23/21 Mearl Latin PT, DPT Physical Therapist at [place], [place], [address] Phone: [phone]   Fax:  [phone]  Name: Albert Gonzalez MRN: [id number] Date of Birth: 09-02-1950

## 2021-02-24 ENCOUNTER — Ambulatory Visit: Payer: Medicare Other | Admitting: Primary Care

## 2021-02-28 ENCOUNTER — Other Ambulatory Visit: Payer: Self-pay | Admitting: Orthopedic Surgery

## 2021-03-01 ENCOUNTER — Telehealth: Payer: Self-pay | Admitting: Orthopedic Surgery

## 2021-03-01 NOTE — Telephone Encounter (Signed)
Patient wife called left voicemail requesting refill for his pain medicine.    traMADol (ULTRAM) 50 MG tablet  Pharmacy: [facility]

## 2021-03-01 NOTE — Telephone Encounter (Signed)
Duplicate request/ was already sent in today

## 2021-03-02 ENCOUNTER — Ambulatory Visit (HOSPITAL_COMMUNITY): Payer: Medicare HMO | Admitting: Physical Therapy

## 2021-03-04 ENCOUNTER — Encounter (HOSPITAL_COMMUNITY): Payer: Self-pay | Admitting: Physical Therapy

## 2021-03-04 ENCOUNTER — Ambulatory Visit (HOSPITAL_COMMUNITY): Payer: Medicare HMO | Attending: Nurse Practitioner | Admitting: Physical Therapy

## 2021-03-04 ENCOUNTER — Other Ambulatory Visit: Payer: Self-pay

## 2021-03-04 DIAGNOSIS — R29898 Other symptoms and signs involving the musculoskeletal system: Secondary | ICD-10-CM | POA: Insufficient documentation

## 2021-03-04 DIAGNOSIS — M6281 Muscle weakness (generalized): Secondary | ICD-10-CM | POA: Diagnosis present

## 2021-03-04 DIAGNOSIS — M25561 Pain in right knee: Secondary | ICD-10-CM | POA: Diagnosis present

## 2021-03-04 DIAGNOSIS — R2689 Other abnormalities of gait and mobility: Secondary | ICD-10-CM | POA: Diagnosis present

## 2021-03-04 NOTE — Therapy (Signed)
[place], [place], [address] Phone: [phone]   Fax:  [phone]  Physical Therapy Treatment  Patient Details  Name: Albert Gonzalez MRN: [id number] Date of Birth: [birth year] Referring Provider (PT): Arther Abbott MD   Encounter Date: 03/04/2021   PT End of Session - 03/04/21 1118     Visit Number 3    Number of Visits 16    Date for PT Re-Evaluation 04/11/21    Authorization Type Primary Medicare [place] MCR adv supplemental  (no auth)    Progress Note Due on Visit 10    PT Start Time 1115    PT Stop Time 1155    PT Time Calculation (min) 40 min    Activity Tolerance Patient tolerated treatment well    Behavior During Therapy [facility] for tasks assessed/performed             Past Medical History:  Diagnosis Date   Anxiety    Arthritis    Depression    History of kidney stones    Hyperlipidemia    Inguinal hernia right    Insomnia    Myalgia    PPD positive, treated 1979   1 year of INH   Raynaud phenomenon    Sarcoidosis 1980    Past Surgical History:  Procedure Laterality Date   AXILLARY LYMPH NODE BIOPSY Right 1980   COLONOSCOPY  02/08/2011   Procedure: COLONOSCOPY;  Surgeon: Daneil Dolin, MD;  [facility];  Service: Endoscopy;  Laterality: N/A;  1:30 PM   INGUINAL HERNIA REPAIR Right 01/29/2017   Procedure: HERNIA REPAIR INGUINAL ADULT WITH MESH;  Surgeon: Virl Cagey, MD;  Location: [facility];  Service: General;  Laterality: Right;   KNEE ARTHROSCOPY WITH MEDIAL MENISECTOMY Right 04/05/2016   Procedure: RIGHT KNEE ARTHROSCOPY WITH PARTIAL  LATERAL MENISECTOMY, DEBRIDEMENT MEDIAL MENISCUS, ACL DEBRIDEMENT, MICRO FRACTURE OF FEMUR;  Surgeon: Carole Civil, MD;  Location: [facility];  Service: Orthopedics;  Laterality: Right;   KNEE SURGERY     left   KYPHOPLASTY     TONSILLECTOMY     TOTAL KNEE ARTHROPLASTY Right 02/08/2021   Procedure: TOTAL KNEE  ARTHROPLASTY;  Surgeon: Carole Civil, MD;  Location: [facility];  Service: Orthopedics;  Laterality: Right;    There were no vitals filed for this visit.   Subjective Assessment - 03/04/21 1117     Subjective Patient says he is doing well. He is walking more at home. Pain is improving. He is doing exercises without issue.    Limitations Standing;Walking;House hold activities    Patient Stated Goals to get his motion back and be able to resume normal lifestyle.    Currently in Pain? Yes    Pain Score 3     Pain Location Knee    Pain Orientation Right    Pain Descriptors / Indicators Aching    Pain Type Surgical pain    Pain Onset In the past 7 days                               [facility] Adult PT Treatment/Exercise - 03/04/21 0001       Knee/Hip Exercises: Stretches   Gastroc Stretch 2 reps;30 seconds    Gastroc Stretch Limitations slant board    Other Knee/Hip Stretches knee driver 12 inch box 5 x 10"      Knee/Hip Exercises: Aerobic  Recumbent Bike 4 min warmup full revolution seat 13      Knee/Hip Exercises: Standing   Heel Raises Both;20 reps    Heel Raises Limitations TR x 20    Hip Abduction Both;2 sets;10 reps    Forward Step Up Right;10 reps;Step Height: 4";2 sets;Hand Hold: 0    Step Down Right;10 reps;Hand Hold: 1;Step Height: 4";1 set    Other Standing Knee Exercises mini squat 2x 10    Other Standing Knee Exercises tandem stance 2 x 30"      Knee/Hip Exercises: Supine   Quad Sets 10 reps    Knee Extension Right;AROM    Knee Extension Limitations 5    Knee Flexion Right;AROM    Knee Flexion Limitations 121    Other Supine Knee/Hip Exercises heel prop 2 minutes                       PT Short Term Goals - 02/14/21 1406       PT SHORT TERM GOAL #1   Title Patient will be independent with HEP in order to improve functional outcomes.    Time 4    Period Weeks    Status New    Target Date 03/14/21      PT SHORT TERM GOAL  #2   Title Patient will report at least 25% improvement in symptoms for improved quality of life.    Time 4    Period Weeks    Status New    Target Date 03/14/21               PT Long Term Goals - 02/14/21 1406       PT LONG TERM GOAL #1   Title Patient will report at least 75% improvement in symptoms for improved quality of life.    Time 8    Period Weeks    Status New    Target Date 04/11/21      PT LONG TERM GOAL #2   Title Pt will demonstrate 5/5 strength of BLE grossly to improve functional mobility and return pt to PLOF.     Time 8    Period Weeks    Status New    Target Date 04/11/21      PT LONG TERM GOAL #3   Title Patient will improve FOTO score by at least 15 points in order to indicate improved tolerance to activity.    Time 8    Period Weeks    Status New    Target Date 04/11/21      PT LONG TERM GOAL #4   Title Patient will be able to navigate stairs with reciprocal pattern without compensation in order to demonstrate improved LE strength.    Time 8    Period Weeks    Status New    Target Date 04/11/21      PT LONG TERM GOAL #5   Title Patient will improve ROM for left knee extension/flexion to 0-120 degrees to improve squatting, and other functional mobility.    Time 8    Period Weeks    Status New    Target Date 04/11/21                   Plan - 03/04/21 1149     Clinical Impression Statement Patient progressing very well. Showing improved AROM and functional ability. Progressed LE strengthening to include step ups and step down. Patient cued on eccentric control with lowering. Added  static balance. Patient challenged initially, but improved with practice and shows good static standing balance overall. Patient will continue to benefit from skilled therapy services to reduce deficits and improve functional ability.    Personal Factors and Comorbidities Comorbidity 2;Time since onset of injury/illness/exacerbation    Comorbidities COPD,  hx knee surgeries    Examination-Activity Limitations Locomotion Level;Transfers;Caring for Others;Dressing;Hygiene/Grooming;Lift;Stand;Stairs;Squat    Examination-Participation Restrictions Meal Prep;Cleaning;Community Activity;Yard Work;Volunteer;Shop;Laundry    Stability/Clinical Decision Making Stable/Uncomplicated    Rehab Potential Good    PT Frequency 2x / week    PT Duration 8 weeks    PT Treatment/Interventions ADLs/Self Care Home Management;Aquatic Therapy;Cryotherapy;Electrical Stimulation;Moist Heat;Traction;Parrafin;Ultrasound;DME Instruction;Gait training;Stair training;Functional mobility training;Therapeutic activities;Therapeutic exercise;Balance training;Neuromuscular re-education;Patient/family education;Orthotic Fit/Training;Manual techniques;Manual lymph drainage;Compression bandaging;Scar mobilization;Passive range of motion;Dry needling;Energy conservation;Splinting;Taping;Joint Manipulations;Spinal Manipulations    PT Next Visit Plan R knee mobility and strengthening, edema management; progress as able    PT Home Exercise Plan quad set, SLR, heel slides, heel prop, LAQ, ankle pumps, elevation, compression 1/6 heel raise, tandem stance, mini squat    Consulted and Agree with Plan of Care Patient             Patient will benefit from skilled therapeutic intervention in order to improve the following deficits and impairments:  Abnormal gait, Decreased range of motion, Difficulty walking, Decreased endurance, Decreased activity tolerance, Decreased balance, Pain, Impaired flexibility, Improper body mechanics, Decreased mobility, Increased edema, Decreased strength  Visit Diagnosis: Right knee pain, unspecified chronicity  Muscle weakness (generalized)  Other abnormalities of gait and mobility  Other symptoms and signs involving the musculoskeletal system     Problem List Patient Active Problem List   Diagnosis Date Noted   S/P total knee replacement, right  02/08/21 02/22/2021   Osteoarthritis of right knee 02/08/2021   Sepsis due to pneumonia ([place]) 09/09/2020   Depression    Anxiety    Hyperlipidemia    Arthritis    Sarcoidosis    Right inguinal hernia    MDD (major depressive disorder), recurrent episode, moderate (HCC) 08/24/2016   Acute medial meniscus tear of left knee    Tear of lateral meniscus of right knee, current    Primary osteoarthritis of right knee    Deficiency of anterior cruciate ligament of right knee    Abnormal CT scan, colon 02/01/2011   Constipation 02/01/2011   11:56 AM, 03/04/21 Josue Hector PT DPT  Physical Therapist with [facility]  [phone] [place], [place], [address] Phone: [phone]   Fax:  [phone]  Name: Albert Gonzalez MRN: [id number] Date of Birth: 1950/05/27

## 2021-03-04 NOTE — Patient Instructions (Signed)
Access Code: [email or web address] URL: [email or web address] Date: 03/04/2021 Prepared by: Josue Hector  Exercises Standing Heel Raise with Support - 3 x daily - 7 x weekly - 2 sets - 10 reps Tandem Stance with Support - 3 x daily - 7 x weekly - 1 sets - 3 reps - 30 second hold Mini Squat with Counter Support - 3 x daily - 7 x weekly - 2 sets - 10 reps

## 2021-03-07 ENCOUNTER — Encounter (HOSPITAL_COMMUNITY): Payer: Self-pay | Admitting: Physical Therapy

## 2021-03-07 ENCOUNTER — Ambulatory Visit (HOSPITAL_COMMUNITY): Payer: Medicare HMO | Admitting: Physical Therapy

## 2021-03-07 ENCOUNTER — Other Ambulatory Visit: Payer: Self-pay

## 2021-03-07 DIAGNOSIS — R29898 Other symptoms and signs involving the musculoskeletal system: Secondary | ICD-10-CM

## 2021-03-07 DIAGNOSIS — M25561 Pain in right knee: Secondary | ICD-10-CM

## 2021-03-07 DIAGNOSIS — R2689 Other abnormalities of gait and mobility: Secondary | ICD-10-CM

## 2021-03-07 DIAGNOSIS — M6281 Muscle weakness (generalized): Secondary | ICD-10-CM

## 2021-03-07 NOTE — Therapy (Signed)
[place], [place], [address] Phone: [phone]   Fax:  [phone]  Physical Therapy Treatment  Patient Details  Name: Albert Gonzalez MRN: [id number] Date of Birth: [birth year] Referring Provider (PT): Arther Abbott MD   Encounter Date: 03/07/2021   PT End of Session - 03/07/21 1315     Visit Number 4    Number of Visits 16    Date for PT Re-Evaluation 04/11/21    Authorization Type Primary Medicare [place] MCR adv supplemental  (no auth)    Progress Note Due on Visit 10    PT Start Time 1315    PT Stop Time 1355    PT Time Calculation (min) 40 min    Activity Tolerance Patient tolerated treatment well    Behavior During Therapy [facility] for tasks assessed/performed             Past Medical History:  Diagnosis Date   Anxiety    Arthritis    Depression    History of kidney stones    Hyperlipidemia    Inguinal hernia right    Insomnia    Myalgia    PPD positive, treated 1979   1 year of INH   Raynaud phenomenon    Sarcoidosis 1980    Past Surgical History:  Procedure Laterality Date   AXILLARY LYMPH NODE BIOPSY Right 1980   COLONOSCOPY  02/08/2011   Procedure: COLONOSCOPY;  Surgeon: Daneil Dolin, MD;  [facility];  Service: Endoscopy;  Laterality: N/A;  1:30 PM   INGUINAL HERNIA REPAIR Right 01/29/2017   Procedure: HERNIA REPAIR INGUINAL ADULT WITH MESH;  Surgeon: Virl Cagey, MD;  Location: [facility];  Service: General;  Laterality: Right;   KNEE ARTHROSCOPY WITH MEDIAL MENISECTOMY Right 04/05/2016   Procedure: RIGHT KNEE ARTHROSCOPY WITH PARTIAL  LATERAL MENISECTOMY, DEBRIDEMENT MEDIAL MENISCUS, ACL DEBRIDEMENT, MICRO FRACTURE OF FEMUR;  Surgeon: Carole Civil, MD;  Location: [facility];  Service: Orthopedics;  Laterality: Right;   KNEE SURGERY     left   KYPHOPLASTY     TONSILLECTOMY     TOTAL KNEE ARTHROPLASTY Right 02/08/2021   Procedure: TOTAL KNEE  ARTHROPLASTY;  Surgeon: Carole Civil, MD;  Location: [facility];  Service: Orthopedics;  Laterality: Right;    There were no vitals filed for this visit.   Subjective Assessment - 03/07/21 1318     Subjective Patient states he is doing better.    Limitations Standing;Walking;House hold activities    Patient Stated Goals to get his motion back and be able to resume normal lifestyle.    Currently in Pain? Yes    Pain Score 4     Pain Location Knee    Pain Orientation Right    Pain Descriptors / Indicators Aching    Pain Type Surgical pain    Pain Onset In the past 7 days                               [facility] Adult PT Treatment/Exercise - 03/07/21 0001       Knee/Hip Exercises: Aerobic   Recumbent Bike 5 min warmup full revolution seat 13      Knee/Hip Exercises: Standing   Heel Raises Both;20 reps    Heel Raises Limitations TR x 20    Lateral Step Up Right;2 sets;10 reps;Step Height: 6";Hand Hold: 1    Forward Step  Up Right;2 sets;10 reps;Hand Hold: 1;Step Height: 6"    Stairs 5 RT 7 inch step    Other Standing Knee Exercises tandem stance 2 x 30"      Knee/Hip Exercises: Seated   Sit to Sand 10 reps;3 sets      Knee/Hip Exercises: Supine   Quad Sets 10 reps    Knee Extension Right;AROM    Knee Extension Limitations lacking 10    Knee Flexion Right;AROM    Knee Flexion Limitations 122    Other Supine Knee/Hip Exercises heel prop 2 minutes                     PT Education - 03/07/21 1318     Education Details HEP    Person(s) Educated Patient    Methods Explanation;Demonstration    Comprehension Verbalized understanding;Returned demonstration              PT Short Term Goals - 02/14/21 1406       PT SHORT TERM GOAL #1   Title Patient will be independent with HEP in order to improve functional outcomes.    Time 4    Period Weeks    Status New    Target Date 03/14/21      PT SHORT TERM GOAL #2   Title Patient will  report at least 25% improvement in symptoms for improved quality of life.    Time 4    Period Weeks    Status New    Target Date 03/14/21               PT Long Term Goals - 02/14/21 1406       PT LONG TERM GOAL #1   Title Patient will report at least 75% improvement in symptoms for improved quality of life.    Time 8    Period Weeks    Status New    Target Date 04/11/21      PT LONG TERM GOAL #2   Title Pt will demonstrate 5/5 strength of BLE grossly to improve functional mobility and return pt to PLOF.     Time 8    Period Weeks    Status New    Target Date 04/11/21      PT LONG TERM GOAL #3   Title Patient will improve FOTO score by at least 15 points in order to indicate improved tolerance to activity.    Time 8    Period Weeks    Status New    Target Date 04/11/21      PT LONG TERM GOAL #4   Title Patient will be able to navigate stairs with reciprocal pattern without compensation in order to demonstrate improved LE strength.    Time 8    Period Weeks    Status New    Target Date 04/11/21      PT LONG TERM GOAL #5   Title Patient will improve ROM for left knee extension/flexion to 0-120 degrees to improve squatting, and other functional mobility.    Time 8    Period Weeks    Status New    Target Date 04/11/21                   Plan - 03/07/21 1318     Clinical Impression Statement Began session on bike for dynamic warm up. Patient demonstrating ROM from lacking 10 to 122 today. Patient with difficulty with sequencing of step up exercise despite frequent cueing.  He is demonstrating improving eccentric strength and control. Patient able to complete stairs with reciprocal gait pattern with minimal deviation due to strength on RLE. Patient will continue to benefit from skilled physical therapy in order to reduce impairment and improve function.    Personal Factors and Comorbidities Comorbidity 2;Time since onset of injury/illness/exacerbation     Comorbidities COPD, hx knee surgeries    Examination-Activity Limitations Locomotion Level;Transfers;Caring for Others;Dressing;Hygiene/Grooming;Lift;Stand;Stairs;Squat    Examination-Participation Restrictions Meal Prep;Cleaning;Community Activity;Yard Work;Volunteer;Shop;Laundry    Stability/Clinical Decision Making Stable/Uncomplicated    Rehab Potential Good    PT Frequency 2x / week    PT Duration 8 weeks    PT Treatment/Interventions ADLs/Self Care Home Management;Aquatic Therapy;Cryotherapy;Electrical Stimulation;Moist Heat;Traction;Parrafin;Ultrasound;DME Instruction;Gait training;Stair training;Functional mobility training;Therapeutic activities;Therapeutic exercise;Balance training;Neuromuscular re-education;Patient/family education;Orthotic Fit/Training;Manual techniques;Manual lymph drainage;Compression bandaging;Scar mobilization;Passive range of motion;Dry needling;Energy conservation;Splinting;Taping;Joint Manipulations;Spinal Manipulations    PT Next Visit Plan R knee mobility and strengthening, edema management; progress as able    PT Home Exercise Plan quad set, SLR, heel slides, heel prop, LAQ, ankle pumps, elevation, compression 1/6 heel raise, tandem stance, mini squat    Consulted and Agree with Plan of Care Patient             Patient will benefit from skilled therapeutic intervention in order to improve the following deficits and impairments:  Abnormal gait, Decreased range of motion, Difficulty walking, Decreased endurance, Decreased activity tolerance, Decreased balance, Pain, Impaired flexibility, Improper body mechanics, Decreased mobility, Increased edema, Decreased strength  Visit Diagnosis: Right knee pain, unspecified chronicity  Muscle weakness (generalized)  Other abnormalities of gait and mobility  Other symptoms and signs involving the musculoskeletal system     Problem List Patient Active Problem List   Diagnosis Date Noted   S/P total knee  replacement, right 02/08/21 02/22/2021   Osteoarthritis of right knee 02/08/2021   Sepsis due to pneumonia ([place]) 09/09/2020   Depression    Anxiety    Hyperlipidemia    Arthritis    Sarcoidosis    Right inguinal hernia    MDD (major depressive disorder), recurrent episode, moderate (HCC) 08/24/2016   Acute medial meniscus tear of left knee    Tear of lateral meniscus of right knee, current    Primary osteoarthritis of right knee    Deficiency of anterior cruciate ligament of right knee    Abnormal CT scan, colon 02/01/2011   Constipation 02/01/2011    1:53 PM, 03/07/21 Mearl Latin PT, DPT Physical Therapist at [place], [place], [address] Phone: [phone]   Fax:  [phone]  Name: Albert Gonzalez MRN: [id number] Date of Birth: 05/04/1950

## 2021-03-09 ENCOUNTER — Encounter (HOSPITAL_COMMUNITY): Payer: Medicare Other | Admitting: Physical Therapy

## 2021-03-11 ENCOUNTER — Ambulatory Visit (HOSPITAL_COMMUNITY): Payer: Medicare HMO

## 2021-03-11 ENCOUNTER — Other Ambulatory Visit: Payer: Self-pay

## 2021-03-11 DIAGNOSIS — M25561 Pain in right knee: Secondary | ICD-10-CM

## 2021-03-11 DIAGNOSIS — M6281 Muscle weakness (generalized): Secondary | ICD-10-CM

## 2021-03-11 DIAGNOSIS — R29898 Other symptoms and signs involving the musculoskeletal system: Secondary | ICD-10-CM

## 2021-03-11 DIAGNOSIS — R2689 Other abnormalities of gait and mobility: Secondary | ICD-10-CM

## 2021-03-11 NOTE — Therapy (Signed)
[place], [place], [address] Phone: [phone]   Fax:  [phone]  Physical Therapy Treatment  Patient Details  Name: WILIAM CAUTHORN MRN: [id number] Date of Birth: [birth year] Referring Provider (PT): Arther Abbott MD   Encounter Date: 03/11/2021   PT End of Session - 03/11/21 1306     Visit Number 5    Number of Visits 16    Date for PT Re-Evaluation 04/11/21    Authorization Type Primary Medicare [place] MCR adv supplemental  (no auth)    Progress Note Due on Visit 10    PT Start Time 1305    PT Stop Time 1345    PT Time Calculation (min) 40 min    Activity Tolerance Patient tolerated treatment well    Behavior During Therapy [facility] for tasks assessed/performed             Past Medical History:  Diagnosis Date   Anxiety    Arthritis    Depression    History of kidney stones    Hyperlipidemia    Inguinal hernia right    Insomnia    Myalgia    PPD positive, treated 1979   1 year of INH   Raynaud phenomenon    Sarcoidosis 1980    Past Surgical History:  Procedure Laterality Date   AXILLARY LYMPH NODE BIOPSY Right 1980   COLONOSCOPY  02/08/2011   Procedure: COLONOSCOPY;  Surgeon: Daneil Dolin, MD;  [facility];  Service: Endoscopy;  Laterality: N/A;  1:30 PM   INGUINAL HERNIA REPAIR Right 01/29/2017   Procedure: HERNIA REPAIR INGUINAL ADULT WITH MESH;  Surgeon: Virl Cagey, MD;  Location: [facility];  Service: General;  Laterality: Right;   KNEE ARTHROSCOPY WITH MEDIAL MENISECTOMY Right 04/05/2016   Procedure: RIGHT KNEE ARTHROSCOPY WITH PARTIAL  LATERAL MENISECTOMY, DEBRIDEMENT MEDIAL MENISCUS, ACL DEBRIDEMENT, MICRO FRACTURE OF FEMUR;  Surgeon: Carole Civil, MD;  Location: [facility];  Service: Orthopedics;  Laterality: Right;   KNEE SURGERY     left   KYPHOPLASTY     TONSILLECTOMY     TOTAL KNEE ARTHROPLASTY Right 02/08/2021   Procedure: TOTAL KNEE  ARTHROPLASTY;  Surgeon: Carole Civil, MD;  Location: [facility];  Service: Orthopedics;  Laterality: Right;    There were no vitals filed for this visit.   Subjective Assessment - 03/11/21 1308     Subjective Patient states that he has been doing well, but he is becoming a little frustrated with his rate of progress.    Limitations Standing;Walking;House hold activities    Patient Stated Goals to get his motion back and be able to resume normal lifestyle.    Pain Onset In the past 7 days                               [facility] Adult PT Treatment/Exercise - 03/11/21 0001       Knee/Hip Exercises: Aerobic   Recumbent Bike 7 min warmup full revolution seat 13      Knee/Hip Exercises: Standing   Knee Flexion 3 sets;10 reps   5lbs   Terminal Knee Extension 3 sets;10 reps    Theraband Level (Terminal Knee Extension) --   plate #4   Lateral Step Up Right;2 sets;10 reps;Step Height: 8";Hand Hold: 1    Forward Step Up Right;2 sets;10 reps;Hand Hold: 1;Step Height: 8"    Other  Standing Knee Exercises Lateral gait w/ 5lbs ankle weights in PBs      Knee/Hip Exercises: Seated   Long Arc Quad 3 sets;10 reps    Long Arc Quad Weight 5 lbs.    Sit to Sand 3 sets;10 reps      Knee/Hip Exercises: Supine   Knee Extension Right;AROM    Knee Extension Limitations lacking 10    Knee Flexion Right;AROM    Knee Flexion Limitations 120                       PT Short Term Goals - 02/14/21 1406       PT SHORT TERM GOAL #1   Title Patient will be independent with HEP in order to improve functional outcomes.    Time 4    Period Weeks    Status New    Target Date 03/14/21      PT SHORT TERM GOAL #2   Title Patient will report at least 25% improvement in symptoms for improved quality of life.    Time 4    Period Weeks    Status New    Target Date 03/14/21               PT Long Term Goals - 02/14/21 1406       PT LONG TERM GOAL #1   Title Patient  will report at least 75% improvement in symptoms for improved quality of life.    Time 8    Period Weeks    Status New    Target Date 04/11/21      PT LONG TERM GOAL #2   Title Pt will demonstrate 5/5 strength of BLE grossly to improve functional mobility and return pt to PLOF.     Time 8    Period Weeks    Status New    Target Date 04/11/21      PT LONG TERM GOAL #3   Title Patient will improve FOTO score by at least 15 points in order to indicate improved tolerance to activity.    Time 8    Period Weeks    Status New    Target Date 04/11/21      PT LONG TERM GOAL #4   Title Patient will be able to navigate stairs with reciprocal pattern without compensation in order to demonstrate improved LE strength.    Time 8    Period Weeks    Status New    Target Date 04/11/21      PT LONG TERM GOAL #5   Title Patient will improve ROM for left knee extension/flexion to 0-120 degrees to improve squatting, and other functional mobility.    Time 8    Period Weeks    Status New    Target Date 04/11/21                   Plan - 03/11/21 1346     Clinical Impression Statement Patient tolerated treatment well during today's session well and no major symptoms of muscle fatigue were noted. As there is still a knee extension restriction, he is still having difficulty with exercises such as TKEs and full sit to stand ROM. Patient would benefit from continued PT services in order to address knee extension strength, lack of full knee extension ROM, and ambulation deficits.    Personal Factors and Comorbidities Comorbidity 2;Time since onset of injury/illness/exacerbation    Comorbidities COPD, hx knee surgeries  Examination-Activity Limitations Locomotion Level;Transfers;Caring for Others;Dressing;Hygiene/Grooming;Lift;Stand;Stairs;Squat    Examination-Participation Restrictions Meal Prep;Cleaning;Community Activity;Yard Work;Volunteer;Shop;Laundry    Stability/Clinical Decision Making  Stable/Uncomplicated    Rehab Potential Good    PT Frequency 2x / week    PT Duration 8 weeks    PT Treatment/Interventions ADLs/Self Care Home Management;Aquatic Therapy;Cryotherapy;Electrical Stimulation;Moist Heat;Traction;Parrafin;Ultrasound;DME Instruction;Gait training;Stair training;Functional mobility training;Therapeutic activities;Therapeutic exercise;Balance training;Neuromuscular re-education;Patient/family education;Orthotic Fit/Training;Manual techniques;Manual lymph drainage;Compression bandaging;Scar mobilization;Passive range of motion;Dry needling;Energy conservation;Splinting;Taping;Joint Manipulations;Spinal Manipulations    PT Next Visit Plan R knee mobility and strengthening, edema management; progress as able    PT Home Exercise Plan quad set, SLR, heel slides, heel prop, LAQ, ankle pumps, elevation, compression 1/6 heel raise, tandem stance, mini squat, prone knee hang    Consulted and Agree with Plan of Care Patient             Patient will benefit from skilled therapeutic intervention in order to improve the following deficits and impairments:  Abnormal gait, Decreased range of motion, Difficulty walking, Decreased endurance, Decreased activity tolerance, Decreased balance, Pain, Impaired flexibility, Improper body mechanics, Decreased mobility, Increased edema, Decreased strength  Visit Diagnosis: Right knee pain, unspecified chronicity  Muscle weakness (generalized)  Other abnormalities of gait and mobility  Other symptoms and signs involving the musculoskeletal system     Problem List Patient Active Problem List   Diagnosis Date Noted   S/P total knee replacement, right 02/08/21 02/22/2021   Osteoarthritis of right knee 02/08/2021   Sepsis due to pneumonia ([place]) 09/09/2020   Depression    Anxiety    Hyperlipidemia    Arthritis    Sarcoidosis    Right inguinal hernia    MDD (major depressive disorder), recurrent episode, moderate (HCC) 08/24/2016    Acute medial meniscus tear of left knee    Tear of lateral meniscus of right knee, current    Primary osteoarthritis of right knee    Deficiency of anterior cruciate ligament of right knee    Abnormal CT scan, colon 02/01/2011   Constipation 02/01/2011    Toniann Fail, PT 03/11/2021, 1:59 PM  [place], [place], [address] Phone: [phone]   Fax:  [phone]  Name: KOURTNEY MONTESINOS MRN: [id number] Date of Birth: November 15, 1950

## 2021-03-14 ENCOUNTER — Encounter (HOSPITAL_COMMUNITY): Payer: Self-pay | Admitting: Physical Therapy

## 2021-03-14 ENCOUNTER — Other Ambulatory Visit: Payer: Self-pay

## 2021-03-14 ENCOUNTER — Ambulatory Visit (HOSPITAL_COMMUNITY): Payer: Medicare HMO | Admitting: Physical Therapy

## 2021-03-14 DIAGNOSIS — M25561 Pain in right knee: Secondary | ICD-10-CM | POA: Diagnosis not present

## 2021-03-14 DIAGNOSIS — R2689 Other abnormalities of gait and mobility: Secondary | ICD-10-CM

## 2021-03-14 DIAGNOSIS — M6281 Muscle weakness (generalized): Secondary | ICD-10-CM

## 2021-03-14 DIAGNOSIS — R29898 Other symptoms and signs involving the musculoskeletal system: Secondary | ICD-10-CM

## 2021-03-14 NOTE — Therapy (Signed)
[place], [place], [address] Phone: [phone]   Fax:  [phone]  Physical Therapy Treatment  Patient Details  Name: Albert Gonzalez MRN: [id number] Date of Birth: 1950/10/17 Referring Provider (PT): Arther Abbott MD   Encounter Date: 03/14/2021   PT End of Session - 03/14/21 1403     Visit Number 6    Number of Visits 16    Date for PT Re-Evaluation 04/11/21    Authorization Type [employer]    Authorization Time Period 10 visits requested 1/6 - 2/13 check auth    Progress Note Due on Visit 10    PT Start Time 1317    PT Stop Time 1401    PT Time Calculation (min) 44 min    Activity Tolerance Patient tolerated treatment well    Behavior During Therapy [facility] for tasks assessed/performed             Past Medical History:  Diagnosis Date   Anxiety    Arthritis    Depression    History of kidney stones    Hyperlipidemia    Inguinal hernia right    Insomnia    Myalgia    PPD positive, treated 1979   1 year of INH   Raynaud phenomenon    Sarcoidosis 1980    Past Surgical History:  Procedure Laterality Date   AXILLARY LYMPH NODE BIOPSY Right 1980   COLONOSCOPY  02/08/2011   Procedure: COLONOSCOPY;  Surgeon: Daneil Dolin, MD;  [facility];  Service: Endoscopy;  Laterality: N/A;  1:30 PM   INGUINAL HERNIA REPAIR Right 01/29/2017   Procedure: HERNIA REPAIR INGUINAL ADULT WITH MESH;  Surgeon: Virl Cagey, MD;  Location: [facility];  Service: General;  Laterality: Right;   KNEE ARTHROSCOPY WITH MEDIAL MENISECTOMY Right 04/05/2016   Procedure: RIGHT KNEE ARTHROSCOPY WITH PARTIAL  LATERAL MENISECTOMY, DEBRIDEMENT MEDIAL MENISCUS, ACL DEBRIDEMENT, MICRO FRACTURE OF FEMUR;  Surgeon: Carole Civil, MD;  Location: [facility];  Service: Orthopedics;  Laterality: Right;   KNEE SURGERY     left   KYPHOPLASTY     TONSILLECTOMY     TOTAL KNEE ARTHROPLASTY Right 02/08/2021   Procedure: TOTAL  KNEE ARTHROPLASTY;  Surgeon: Carole Civil, MD;  Location: [facility];  Service: Orthopedics;  Laterality: Right;    There were no vitals filed for this visit.   Subjective Assessment - 03/14/21 1322     Subjective Patient reports that he is having some pain in the lateral aspect of his Rt knee this morning. He states that he did lay on his Rt side during sleep last night.    Limitations Standing;Walking;House hold activities    Patient Stated Goals to get his motion back and be able to resume normal lifestyle.    Currently in Pain? Yes    Pain Score 1     Pain Location Knee    Pain Orientation Right    Pain Descriptors / Indicators Aching    Pain Type Surgical pain    Pain Onset In the past 7 days                               [facility] Adult PT Treatment/Exercise - 03/14/21 0001       Knee/Hip Exercises: Aerobic   Recumbent Bike x3min      Knee/Hip Exercises: Standing   Lateral Step Up 10 reps;3 sets  Lunge Walking - Round Trips 1x10 each in PBs and with foam pad      Knee/Hip Exercises: Seated   Other Seated Knee/Hip Exercises Seated stool drags 2x43ft    Sit to Sand 3 sets;10 reps   with 2in step(for lowered sitting surface)     Knee/Hip Exercises: Supine   Knee Extension AROM;Right    Knee Extension Limitations lacking 7    Knee Flexion AROM;Right    Knee Flexion Limitations 129      Knee/Hip Exercises: Prone   Prone Knee Hang Other (comment)   20s on / 5s off  (49min total)                    PT Education - 03/14/21 1403     Education Details Lateral step down and lunge mechanics    Person(s) Educated Patient    Methods Explanation    Comprehension Verbalized understanding              PT Short Term Goals - 02/14/21 1406       PT SHORT TERM GOAL #1   Title Patient will be independent with HEP in order to improve functional outcomes.    Time 4    Period Weeks    Status New    Target Date 03/14/21      PT SHORT TERM  GOAL #2   Title Patient will report at least 25% improvement in symptoms for improved quality of life.    Time 4    Period Weeks    Status New    Target Date 03/14/21               PT Long Term Goals - 02/14/21 1406       PT LONG TERM GOAL #1   Title Patient will report at least 75% improvement in symptoms for improved quality of life.    Time 8    Period Weeks    Status New    Target Date 04/11/21      PT LONG TERM GOAL #2   Title Pt will demonstrate 5/5 strength of BLE grossly to improve functional mobility and return pt to PLOF.     Time 8    Period Weeks    Status New    Target Date 04/11/21      PT LONG TERM GOAL #3   Title Patient will improve FOTO score by at least 15 points in order to indicate improved tolerance to activity.    Time 8    Period Weeks    Status New    Target Date 04/11/21      PT LONG TERM GOAL #4   Title Patient will be able to navigate stairs with reciprocal pattern without compensation in order to demonstrate improved LE strength.    Time 8    Period Weeks    Status New    Target Date 04/11/21      PT LONG TERM GOAL #5   Title Patient will improve ROM for left knee extension/flexion to 0-120 degrees to improve squatting, and other functional mobility.    Time 8    Period Weeks    Status New    Target Date 04/11/21                   Plan - 03/14/21 1505     Clinical Impression Statement Patient again tolerated treatment well during today's session and there was a slight improvement in Rt knee  ext AROM following completion of prone knee hang stretch as well as a moderate improvement in Rt knee flex AROM. Lateral step up form has improved and fatigue was noted by the end of each set. Increased UE use during the repeated lunge exercise was noted which increased further while the RLE was operating as the forward limb.    Personal Factors and Comorbidities Comorbidity 2;Time since onset of injury/illness/exacerbation     Comorbidities COPD, hx knee surgeries    Examination-Activity Limitations Locomotion Level;Transfers;Caring for Others;Dressing;Hygiene/Grooming;Lift;Stand;Stairs;Squat    Examination-Participation Restrictions Meal Prep;Cleaning;Community Activity;Yard Work;Volunteer;Shop;Laundry    Stability/Clinical Decision Making Stable/Uncomplicated    Rehab Potential Good    PT Frequency 2x / week    PT Duration 8 weeks    PT Treatment/Interventions ADLs/Self Care Home Management;Aquatic Therapy;Cryotherapy;Electrical Stimulation;Moist Heat;Traction;Parrafin;Ultrasound;DME Instruction;Gait training;Stair training;Functional mobility training;Therapeutic activities;Therapeutic exercise;Balance training;Neuromuscular re-education;Patient/family education;Orthotic Fit/Training;Manual techniques;Manual lymph drainage;Compression bandaging;Scar mobilization;Passive range of motion;Dry needling;Energy conservation;Splinting;Taping;Joint Manipulations;Spinal Manipulations    PT Next Visit Plan R knee mobility and strengthening, edema management; progress as able    PT Home Exercise Plan quad set, SLR, heel slides, heel prop, LAQ, ankle pumps, elevation, compression 1/6 heel raise, tandem stance, mini squat, prone knee hang    Consulted and Agree with Plan of Care Patient             Patient will benefit from skilled therapeutic intervention in order to improve the following deficits and impairments:  Abnormal gait, Decreased range of motion, Difficulty walking, Decreased endurance, Decreased activity tolerance, Decreased balance, Pain, Impaired flexibility, Improper body mechanics, Decreased mobility, Increased edema, Decreased strength  Visit Diagnosis: Right knee pain, unspecified chronicity  Muscle weakness (generalized)  Other abnormalities of gait and mobility  Other symptoms and signs involving the musculoskeletal system     Problem List Patient Active Problem List   Diagnosis Date Noted    S/P total knee replacement, right 02/08/21 02/22/2021   Osteoarthritis of right knee 02/08/2021   Sepsis due to pneumonia ([place]) 09/09/2020   Depression    Anxiety    Hyperlipidemia    Arthritis    Sarcoidosis    Right inguinal hernia    MDD (major depressive disorder), recurrent episode, moderate (HCC) 08/24/2016   Acute medial meniscus tear of left knee    Tear of lateral meniscus of right knee, current    Primary osteoarthritis of right knee    Deficiency of anterior cruciate ligament of right knee    Abnormal CT scan, colon 02/01/2011   Constipation 02/01/2011    3:11 PM,03/14/21 Adalberto Cole, DPT [place], [place], [address] Phone: [phone]   Fax:  [phone]  Name: Albert Gonzalez MRN: [id number] Date of Birth: [birth year]

## 2021-03-16 ENCOUNTER — Other Ambulatory Visit: Payer: Self-pay

## 2021-03-16 ENCOUNTER — Encounter (HOSPITAL_COMMUNITY): Payer: Medicare Other | Admitting: Physical Therapy

## 2021-03-16 ENCOUNTER — Encounter: Payer: Self-pay | Admitting: Orthopedic Surgery

## 2021-03-16 ENCOUNTER — Ambulatory Visit (INDEPENDENT_AMBULATORY_CARE_PROVIDER_SITE_OTHER): Payer: Medicare HMO | Admitting: Orthopedic Surgery

## 2021-03-16 DIAGNOSIS — Z96651 Presence of right artificial knee joint: Secondary | ICD-10-CM

## 2021-03-16 NOTE — Progress Notes (Signed)
Chief Complaint  Patient presents with   Post-op Follow-up    02/08/21 right knee replacement    Encounter Diagnosis  Name Primary?   S/P total knee replacement, right 02/08/21 Yes   Albert Gonzalez is happy with his knee he still working on that last 2 to 3 degrees of extension  He walks with just a hint of a limp  His knee flexion is excellent at 125 degrees  I will see him in a month or so

## 2021-03-18 ENCOUNTER — Ambulatory Visit (HOSPITAL_COMMUNITY): Payer: Medicare HMO

## 2021-03-18 ENCOUNTER — Other Ambulatory Visit: Payer: Self-pay

## 2021-03-18 ENCOUNTER — Encounter (HOSPITAL_COMMUNITY): Payer: Self-pay

## 2021-03-18 DIAGNOSIS — M25561 Pain in right knee: Secondary | ICD-10-CM

## 2021-03-18 DIAGNOSIS — R2689 Other abnormalities of gait and mobility: Secondary | ICD-10-CM

## 2021-03-18 DIAGNOSIS — M6281 Muscle weakness (generalized): Secondary | ICD-10-CM

## 2021-03-18 DIAGNOSIS — R29898 Other symptoms and signs involving the musculoskeletal system: Secondary | ICD-10-CM

## 2021-03-18 NOTE — Therapy (Signed)
[place], [place], [address] Phone: [phone]   Fax:  [phone]  Physical Therapy Treatment  Patient Details  Name: Albert Gonzalez MRN: [id number] Date of Birth: August 27, 1950 Referring Provider (PT): Arther Abbott MD   Encounter Date: 03/18/2021   PT End of Session - 03/18/21 1303     Visit Number 7    Number of Visits 16    Date for PT Re-Evaluation 04/11/21    Authorization Type [employer]    Authorization Time Period 10 visits requested 1/6 - 2/13 check auth    Progress Note Due on Visit 10    PT Start Time 1259    PT Stop Time 1345    PT Time Calculation (min) 46 min    Activity Tolerance Patient tolerated treatment well    Behavior During Therapy [facility] for tasks assessed/performed             Past Medical History:  Diagnosis Date   Anxiety    Arthritis    Depression    History of kidney stones    Hyperlipidemia    Inguinal hernia right    Insomnia    Myalgia    PPD positive, treated 1979   1 year of INH   Raynaud phenomenon    Sarcoidosis 1980    Past Surgical History:  Procedure Laterality Date   AXILLARY LYMPH NODE BIOPSY Right 1980   COLONOSCOPY  02/08/2011   Procedure: COLONOSCOPY;  Surgeon: Daneil Dolin, MD;  [facility];  Service: Endoscopy;  Laterality: N/A;  1:30 PM   INGUINAL HERNIA REPAIR Right 01/29/2017   Procedure: HERNIA REPAIR INGUINAL ADULT WITH MESH;  Surgeon: Virl Cagey, MD;  Location: [facility];  Service: General;  Laterality: Right;   KNEE ARTHROSCOPY WITH MEDIAL MENISECTOMY Right 04/05/2016   Procedure: RIGHT KNEE ARTHROSCOPY WITH PARTIAL  LATERAL MENISECTOMY, DEBRIDEMENT MEDIAL MENISCUS, ACL DEBRIDEMENT, MICRO FRACTURE OF FEMUR;  Surgeon: Carole Civil, MD;  Location: [facility];  Service: Orthopedics;  Laterality: Right;   KNEE SURGERY     left   KYPHOPLASTY     TONSILLECTOMY     TOTAL KNEE ARTHROPLASTY Right 02/08/2021   Procedure: TOTAL  KNEE ARTHROPLASTY;  Surgeon: Carole Civil, MD;  Location: [facility];  Service: Orthopedics;  Laterality: Right;    There were no vitals filed for this visit.   Subjective Assessment - 03/18/21 1303     Subjective Patient reports that h had his follow up with the surgeon who was pleased with the progres so far. He states that he was also informed that he no longer has to wear the compression stocking on his leg, but when he tried going without it his leg swelled considerably.    Limitations Standing;Walking;House hold activities    Patient Stated Goals to get his motion back and be able to resume normal lifestyle.    Currently in Pain? Yes    Pain Score 2     Pain Location Knee    Pain Orientation Right    Pain Type Surgical pain    Pain Onset In the past 7 days                               [facility] Adult PT Treatment/Exercise - 03/18/21 0001       Knee/Hip Exercises: Stretches   Gastroc Stretch Both;5 reps;30 seconds;Other (comment)   slant board  and gait belt for knee restriction used     Knee/Hip Exercises: Aerobic   Recumbent Bike x23min      Knee/Hip Exercises: Standing   Terminal Knee Extension 3 sets;10 reps;Other (comment)    Theraband Level (Terminal Knee Extension) --   cable #3 and 3s holds     Knee/Hip Exercises: Seated   Long [employer] Other (comment)   4x8x2s holds on [employer] machine   Long Arc Quad Weight 2 lbs.      Knee/Hip Exercises: Supine   Knee Extension Right;AROM    Knee Extension Limitations lacking 5   lacking 4 after stretches   Knee Flexion Right;AROM    Knee Flexion Limitations 128      Knee/Hip Exercises: Prone   Prone Knee Hang Other (comment)   12x20s holds w/ 1.5lbs AW                      PT Short Term Goals - 02/14/21 1406       PT SHORT TERM GOAL #1   Title Patient will be independent with HEP in order to improve functional outcomes.    Time 4    Period Weeks    Status New    Target Date  03/14/21      PT SHORT TERM GOAL #2   Title Patient will report at least 25% improvement in symptoms for improved quality of life.    Time 4    Period Weeks    Status New    Target Date 03/14/21               PT Long Term Goals - 02/14/21 1406       PT LONG TERM GOAL #1   Title Patient will report at least 75% improvement in symptoms for improved quality of life.    Time 8    Period Weeks    Status New    Target Date 04/11/21      PT LONG TERM GOAL #2   Title Pt will demonstrate 5/5 strength of BLE grossly to improve functional mobility and return pt to PLOF.     Time 8    Period Weeks    Status New    Target Date 04/11/21      PT LONG TERM GOAL #3   Title Patient will improve FOTO score by at least 15 points in order to indicate improved tolerance to activity.    Time 8    Period Weeks    Status New    Target Date 04/11/21      PT LONG TERM GOAL #4   Title Patient will be able to navigate stairs with reciprocal pattern without compensation in order to demonstrate improved LE strength.    Time 8    Period Weeks    Status New    Target Date 04/11/21      PT LONG TERM GOAL #5   Title Patient will improve ROM for left knee extension/flexion to 0-120 degrees to improve squatting, and other functional mobility.    Time 8    Period Weeks    Status New    Target Date 04/11/21                   Plan - 03/18/21 1313     Clinical Impression Statement Patient tolerated treatment well during this session and ther was a slight improvement in Rt knee extension AROM while flexion AROM remains the same and  appropriate. Quadricep fatigue was noted following completion of the knee extension exercise. Decreased knee extension during heel strike aw well as stance phase is still noted during gait and especially following quadricep fatiguing exercise.    Personal Factors and Comorbidities Comorbidity 2;Time since onset of injury/illness/exacerbation    Comorbidities  COPD, hx knee surgeries    Examination-Activity Limitations Locomotion Level;Transfers;Caring for Others;Dressing;Hygiene/Grooming;Lift;Stand;Stairs;Squat    Examination-Participation Restrictions Meal Prep;Cleaning;Community Activity;Yard Work;Volunteer;Shop;Laundry    Stability/Clinical Decision Making Stable/Uncomplicated    Rehab Potential Good    PT Frequency 2x / week    PT Duration 8 weeks    PT Treatment/Interventions ADLs/Self Care Home Management;Aquatic Therapy;Cryotherapy;Electrical Stimulation;Moist Heat;Traction;Parrafin;Ultrasound;DME Instruction;Gait training;Stair training;Functional mobility training;Therapeutic activities;Therapeutic exercise;Balance training;Neuromuscular re-education;Patient/family education;Orthotic Fit/Training;Manual techniques;Manual lymph drainage;Compression bandaging;Scar mobilization;Passive range of motion;Dry needling;Energy conservation;Splinting;Taping;Joint Manipulations;Spinal Manipulations    PT Next Visit Plan Focus on hamstring and gastroc stretches in order to improve knee extension AROM. Continue with quadricep and hamstring strengthening.    PT Home Exercise Plan quad set, SLR, heel slides, heel prop, LAQ, ankle pumps, elevation, compression 1/6 heel raise, tandem stance, mini squat, prone knee hang    Consulted and Agree with Plan of Care Patient             Patient will benefit from skilled therapeutic intervention in order to improve the following deficits and impairments:  Abnormal gait, Decreased range of motion, Difficulty walking, Decreased endurance, Decreased activity tolerance, Decreased balance, Pain, Impaired flexibility, Improper body mechanics, Decreased mobility, Increased edema, Decreased strength  Visit Diagnosis: Right knee pain, unspecified chronicity  Other abnormalities of gait and mobility  Muscle weakness (generalized)  Other symptoms and signs involving the musculoskeletal system     Problem  List Patient Active Problem List   Diagnosis Date Noted   S/P total knee replacement, right 02/08/21 02/22/2021   Osteoarthritis of right knee 02/08/2021   Sepsis due to pneumonia ([place]) 09/09/2020   Depression    Anxiety    Hyperlipidemia    Arthritis    Sarcoidosis    Right inguinal hernia    MDD (major depressive disorder), recurrent episode, moderate (HCC) 08/24/2016   Acute medial meniscus tear of left knee    Tear of lateral meniscus of right knee, current    Primary osteoarthritis of right knee    Deficiency of anterior cruciate ligament of right knee    Abnormal CT scan, colon 02/01/2011   Constipation 02/01/2011   2:02 PM,03/18/21 Adalberto Cole, DPT [facility]   [place], [place], [address] Phone: [phone]   Fax:  [phone]  Name: MACY LINGENFELTER MRN: [id number] Date of Birth: Oct 13, 1950

## 2021-03-22 ENCOUNTER — Ambulatory Visit (HOSPITAL_COMMUNITY): Payer: Medicare HMO

## 2021-03-22 ENCOUNTER — Other Ambulatory Visit: Payer: Self-pay

## 2021-03-22 DIAGNOSIS — M25561 Pain in right knee: Secondary | ICD-10-CM | POA: Diagnosis not present

## 2021-03-22 DIAGNOSIS — R29898 Other symptoms and signs involving the musculoskeletal system: Secondary | ICD-10-CM

## 2021-03-22 DIAGNOSIS — R2689 Other abnormalities of gait and mobility: Secondary | ICD-10-CM

## 2021-03-22 DIAGNOSIS — M6281 Muscle weakness (generalized): Secondary | ICD-10-CM

## 2021-03-22 NOTE — Therapy (Signed)
[place], [place], [address] Phone: [phone]   Fax:  [phone]  Physical Therapy Treatment  Patient Details  Name: Albert Gonzalez MRN: [id number] Date of Birth: 1950/07/29 Referring Provider (PT): Arther Abbott MD   Encounter Date: 03/22/2021   PT End of Session - 03/22/21 1601     Visit Number 8    Number of Visits 16    Date for PT Re-Evaluation 04/11/21    Authorization Type [employer]    Authorization Time Period 10 visits requested 1/6 - 2/13 check auth    Progress Note Due on Visit 10    PT Start Time 1600    PT Stop Time 1645    PT Time Calculation (min) 45 min    Activity Tolerance Patient tolerated treatment well    Behavior During Therapy [facility] for tasks assessed/performed             Past Medical History:  Diagnosis Date   Anxiety    Arthritis    Depression    History of kidney stones    Hyperlipidemia    Inguinal hernia right    Insomnia    Myalgia    PPD positive, treated 1979   1 year of INH   Raynaud phenomenon    Sarcoidosis 1980    Past Surgical History:  Procedure Laterality Date   AXILLARY LYMPH NODE BIOPSY Right 1980   COLONOSCOPY  02/08/2011   Procedure: COLONOSCOPY;  Surgeon: Daneil Dolin, MD;  [facility];  Service: Endoscopy;  Laterality: N/A;  1:30 PM   INGUINAL HERNIA REPAIR Right 01/29/2017   Procedure: HERNIA REPAIR INGUINAL ADULT WITH MESH;  Surgeon: Virl Cagey, MD;  Location: [facility];  Service: General;  Laterality: Right;   KNEE ARTHROSCOPY WITH MEDIAL MENISECTOMY Right 04/05/2016   Procedure: RIGHT KNEE ARTHROSCOPY WITH PARTIAL  LATERAL MENISECTOMY, DEBRIDEMENT MEDIAL MENISCUS, ACL DEBRIDEMENT, MICRO FRACTURE OF FEMUR;  Surgeon: Carole Civil, MD;  Location: [facility];  Service: Orthopedics;  Laterality: Right;   KNEE SURGERY     left   KYPHOPLASTY     TONSILLECTOMY     TOTAL KNEE ARTHROPLASTY Right 02/08/2021   Procedure: TOTAL  KNEE ARTHROPLASTY;  Surgeon: Carole Civil, MD;  Location: [facility];  Service: Orthopedics;  Laterality: Right;    There were no vitals filed for this visit.   Subjective Assessment - 03/22/21 1636     Subjective Feeling pretty good, no new complaints. Wearing compression stocking to keep the swelling    Limitations Standing;Walking;House hold activities    Patient Stated Goals to get his motion back and be able to resume normal lifestyle.    Currently in Pain? Yes    Pain Score 2     Pain Location Knee    Pain Orientation Right    Pain Type Surgical pain    Pain Onset In the past 7 days                [facility] PT Assessment - 03/22/21 0001       Assessment   Medical Diagnosis s/p R TKA    Referring Provider (PT) Arther Abbott MD                           [facility] Adult PT Treatment/Exercise - 03/22/21 0001       Ambulation/Gait   Ambulation/Gait Yes    Gait Comments treadmill 1.7  mph 9% grade to improve stride length and activity tolerance x 9 min with 2 min ramp up to speed/elevation      Knee/Hip Exercises: Stretches   Sports administrator Left;4 reps;30 seconds    Quad Stretch Limitations prone with strap      Knee/Hip Exercises: Machines for Strengthening   Cybex Knee Flexion 6 plates 3x10, 3 sec hold      Knee/Hip Exercises: Standing   Knee Flexion 3 sets;10 reps    Knee Flexion Limitations 5#    Hip Flexion Stengthening;Both    Hip Flexion Limitations 2 min stair taps 5#    Walking with Sports Cord retro-forward 3 plates resistance x2 min. Then 4 plates x 2 min    Other Standing Knee Exercises Lateral gait w/ 5lbs ankle weights in PBs      Knee/Hip Exercises: Seated   Long Arc Quad Strengthening;Both;3 sets;10 reps    Long Arc Quad Weight 5 lbs.                       PT Short Term Goals - 03/22/21 1645       PT SHORT TERM GOAL #1   Title Patient will be independent with HEP in order to improve functional outcomes.    Time  4    Period Weeks    Status On-going    Target Date 03/14/21      PT SHORT TERM GOAL #2   Title Patient will report at least 25% improvement in symptoms for improved quality of life.    Time 4    Period Weeks    Status On-going    Target Date 03/14/21               PT Long Term Goals - 02/14/21 1406       PT LONG TERM GOAL #1   Title Patient will report at least 75% improvement in symptoms for improved quality of life.    Time 8    Period Weeks    Status New    Target Date 04/11/21      PT LONG TERM GOAL #2   Title Pt will demonstrate 5/5 strength of BLE grossly to improve functional mobility and return pt to PLOF.     Time 8    Period Weeks    Status New    Target Date 04/11/21      PT LONG TERM GOAL #3   Title Patient will improve FOTO score by at least 15 points in order to indicate improved tolerance to activity.    Time 8    Period Weeks    Status New    Target Date 04/11/21      PT LONG TERM GOAL #4   Title Patient will be able to navigate stairs with reciprocal pattern without compensation in order to demonstrate improved LE strength.    Time 8    Period Weeks    Status New    Target Date 04/11/21      PT LONG TERM GOAL #5   Title Patient will improve ROM for left knee extension/flexion to 0-120 degrees to improve squatting, and other functional mobility.    Time 8    Period Weeks    Status New    Target Date 04/11/21                   Plan - 03/22/21 1641     Clinical Impression Statement Progressing very well with  POC details and demonstrating improved right knee flexion and extension ROM.  Able to perform resisted exercises wih increased resistance, time, and repetition and able to launch into sustained walking on treadmill at 1.7 mph before onset of fatigue and tolerating prone quad stretch    Personal Factors and Comorbidities Comorbidity 2;Time since onset of injury/illness/exacerbation    Comorbidities COPD, hx knee surgeries     Examination-Activity Limitations Locomotion Level;Transfers;Caring for Others;Dressing;Hygiene/Grooming;Lift;Stand;Stairs;Squat    Examination-Participation Restrictions Meal Prep;Cleaning;Community Activity;Yard Work;Volunteer;Shop;Laundry    Stability/Clinical Decision Making Stable/Uncomplicated    Rehab Potential Good    PT Frequency 2x / week    PT Duration 8 weeks    PT Treatment/Interventions ADLs/Self Care Home Management;Aquatic Therapy;Cryotherapy;Electrical Stimulation;Moist Heat;Traction;Parrafin;Ultrasound;DME Instruction;Gait training;Stair training;Functional mobility training;Therapeutic activities;Therapeutic exercise;Balance training;Neuromuscular re-education;Patient/family education;Orthotic Fit/Training;Manual techniques;Manual lymph drainage;Compression bandaging;Scar mobilization;Passive range of motion;Dry needling;Energy conservation;Splinting;Taping;Joint Manipulations;Spinal Manipulations    PT Next Visit Plan Re-assess STG/LTG and D/C if amenable    PT Home Exercise Plan quad set, SLR, heel slides, heel prop, LAQ, ankle pumps, elevation, compression 1/6 heel raise, tandem stance, mini squat, prone knee hang, prone quad    Consulted and Agree with Plan of Care Patient             Patient will benefit from skilled therapeutic intervention in order to improve the following deficits and impairments:  Abnormal gait, Decreased range of motion, Difficulty walking, Decreased endurance, Decreased activity tolerance, Decreased balance, Pain, Impaired flexibility, Improper body mechanics, Decreased mobility, Increased edema, Decreased strength  Visit Diagnosis: Right knee pain, unspecified chronicity  Other abnormalities of gait and mobility  Muscle weakness (generalized)  Other symptoms and signs involving the musculoskeletal system     Problem List Patient Active Problem List   Diagnosis Date Noted   S/P total knee replacement, right 02/08/21 02/22/2021    Osteoarthritis of right knee 02/08/2021   Sepsis due to pneumonia ([place]) 09/09/2020   Depression    Anxiety    Hyperlipidemia    Arthritis    Sarcoidosis    Right inguinal hernia    MDD (major depressive disorder), recurrent episode, moderate (HCC) 08/24/2016   Acute medial meniscus tear of left knee    Tear of lateral meniscus of right knee, current    Primary osteoarthritis of right knee    Deficiency of anterior cruciate ligament of right knee    Abnormal CT scan, colon 02/01/2011   Constipation 02/01/2011    Toniann Fail, PT 03/22/2021, 4:46 PM  [place], [place], [address] Phone: [phone]   Fax:  [phone]  Name: Albert Gonzalez MRN: [id number] Date of Birth: Jul 23, 1950

## 2021-03-24 ENCOUNTER — Encounter (HOSPITAL_COMMUNITY): Payer: Self-pay | Admitting: Physical Therapy

## 2021-03-24 ENCOUNTER — Other Ambulatory Visit: Payer: Self-pay

## 2021-03-24 ENCOUNTER — Ambulatory Visit (HOSPITAL_COMMUNITY): Payer: Medicare HMO | Admitting: Physical Therapy

## 2021-03-24 DIAGNOSIS — M6281 Muscle weakness (generalized): Secondary | ICD-10-CM

## 2021-03-24 DIAGNOSIS — R2689 Other abnormalities of gait and mobility: Secondary | ICD-10-CM

## 2021-03-24 DIAGNOSIS — R29898 Other symptoms and signs involving the musculoskeletal system: Secondary | ICD-10-CM

## 2021-03-24 DIAGNOSIS — M25561 Pain in right knee: Secondary | ICD-10-CM

## 2021-03-24 NOTE — Therapy (Addendum)
[place], [place], [address] Phone: [phone]   Fax:  [phone]  Physical Therapy Treatment/Discharge Summary ROM lacking 7 to 129  Patient Details  Name: Albert Gonzalez MRN: [id number] Date of Birth: 1950-08-11 Referring Provider (PT): Arther Abbott MD  PHYSICAL THERAPY DISCHARGE SUMMARY  Visits from Start of Care: 9  Current functional level related to goals / functional outcomes: See below   Remaining deficits: See below   Education / Equipment: See below   Patient agrees to discharge. Patient goals were met. Patient is being discharged due to being pleased with the current functional level.   Encounter Date: 03/24/2021   PT End of Session - 03/24/21 1050     Visit Number 9    Number of Visits 16    Date for PT Re-Evaluation 04/11/21    Authorization Type [employer]    Authorization Time Period 10 visits requested 1/6 - 2/13 check auth    Progress Note Due on Visit 10    PT Start Time 1050    PT Stop Time 1108    PT Time Calculation (min) 18 min    Activity Tolerance Patient tolerated treatment well    Behavior During Therapy WFL for tasks assessed/performed             Past Medical History:  Diagnosis Date   Anxiety    Arthritis    Depression    History of kidney stones    Hyperlipidemia    Inguinal hernia right    Insomnia    Myalgia    PPD positive, treated 1979   1 year of INH   Raynaud phenomenon    Sarcoidosis 1980    Past Surgical History:  Procedure Laterality Date   AXILLARY LYMPH NODE BIOPSY Right 1980   COLONOSCOPY  02/08/2011   Procedure: COLONOSCOPY;  Surgeon: Daneil Dolin, MD;  [facility];  Service: Endoscopy;  Laterality: N/A;  1:30 PM   INGUINAL HERNIA REPAIR Right 01/29/2017   Procedure: HERNIA REPAIR INGUINAL ADULT WITH MESH;  Surgeon: Virl Cagey, MD;  Location: [facility];  Service: General;  Laterality: Right;   KNEE ARTHROSCOPY WITH  MEDIAL MENISECTOMY Right 04/05/2016   Procedure: RIGHT KNEE ARTHROSCOPY WITH PARTIAL  LATERAL MENISECTOMY, DEBRIDEMENT MEDIAL MENISCUS, ACL DEBRIDEMENT, MICRO FRACTURE OF FEMUR;  Surgeon: Carole Civil, MD;  Location: [facility];  Service: Orthopedics;  Laterality: Right;   KNEE SURGERY     left   KYPHOPLASTY     TONSILLECTOMY     TOTAL KNEE ARTHROPLASTY Right 02/08/2021   Procedure: TOTAL KNEE ARTHROPLASTY;  Surgeon: Carole Civil, MD;  Location: [facility];  Service: Orthopedics;  Laterality: Right;    There were no vitals filed for this visit.   Subjective Assessment - 03/24/21 1051     Subjective Patient states stiffness in morning but no other major problems. Patient states home exercises are going well. Patient states 100% improvement with PT intervention. Pain is much less than it was and he is getting around well.    Limitations Standing;Walking;House hold activities    Patient Stated Goals to get his motion back and be able to resume normal lifestyle.    Currently in Pain? No/denies                [facility] PT Assessment - 03/24/21 0001       Assessment   Medical Diagnosis s/p R TKA    Referring Provider (PT)  Arther Abbott MD    Onset Date/Surgical Date 02/08/21    Prior Therapy acute      Precautions   Precautions None      Restrictions   Weight Bearing Restrictions No      Balance Screen   Has the patient fallen in the past 6 months No    Has the patient had a decrease in activity level because of a fear of falling?  No    Is the patient reluctant to leave their home because of a fear of falling?  No      Prior Function   Level of Independence Independent    Vocation Retired    Leisure walking      Cognition   Overall Cognitive Status Within Functional Limits for tasks assessed      Observation/Other Assessments   Observations ambulates without AD, compression garment RLE    Focus on Therapeutic Outcomes (FOTO)  99% function      AROM   Right  Knee Extension 7   lacking   Right Knee Flexion 129      Strength   Right Knee Flexion 5/5    Right Knee Extension 5/5      Ambulation/Gait   Gait Comments Stairs: good mechanics with unilateral HHA                           [facility] Adult PT Treatment/Exercise - 03/24/21 0001       Knee/Hip Exercises: Aerobic   Recumbent Bike 5 min for dynamic warm up      Knee/Hip Exercises: Supine   Knee Extension Right;AROM    Knee Extension Limitations lacking 7    Knee Flexion Right;AROM    Knee Flexion Limitations 129                     PT Education - 03/24/21 1050     Education Details HEP, progress made    Person(s) Educated Patient    Methods Explanation;Demonstration    Comprehension Verbalized understanding;Returned demonstration              PT Short Term Goals - 03/24/21 1054       PT SHORT TERM GOAL #1   Title Patient will be independent with HEP in order to improve functional outcomes.    Time 4    Period Weeks    Status Achieved    Target Date 03/14/21      PT SHORT TERM GOAL #2   Title Patient will report at least 25% improvement in symptoms for improved quality of life.    Time 4    Period Weeks    Status Achieved    Target Date 03/14/21               PT Long Term Goals - 03/24/21 1054       PT LONG TERM GOAL #1   Title Patient will report at least 75% improvement in symptoms for improved quality of life.    Time 8    Period Weeks    Status Achieved    Target Date 04/11/21      PT LONG TERM GOAL #2   Title Pt will demonstrate 5/5 strength of BLE grossly to improve functional mobility and return pt to PLOF.     Time 8    Period Weeks    Status Achieved    Target Date 04/11/21  PT LONG TERM GOAL #3   Title Patient will improve FOTO score by at least 15 points in order to indicate improved tolerance to activity.    Time 8    Period Weeks    Status Achieved    Target Date 04/11/21      PT LONG TERM GOAL  #4   Title Patient will be able to navigate stairs with reciprocal pattern without compensation in order to demonstrate improved LE strength.    Time 8    Period Weeks    Status Achieved    Target Date 04/11/21      PT LONG TERM GOAL #5   Title Patient will improve ROM for left knee extension/flexion to 0-120 degrees to improve squatting, and other functional mobility.    Time 8    Period Weeks    Status Partially Met    Target Date 04/11/21                   Plan - 03/24/21 1050     Clinical Impression Statement Patient has met all short and long term goals with ability to complete HEP and improvement in symptoms, strength, gait, ROM, stairs, and functional mobility. He continues to remain limited by end range extension ROM, edema, and minimally decreased strength with ADL. Patient educated on progress made and returning to PT if needed. Patient discharged from physical therapy at this time.    Personal Factors and Comorbidities Comorbidity 2;Time since onset of injury/illness/exacerbation    Comorbidities COPD, hx knee surgeries    Examination-Activity Limitations Locomotion Level;Transfers;Caring for Others;Dressing;Hygiene/Grooming;Lift;Stand;Stairs;Squat    Examination-Participation Restrictions Meal Prep;Cleaning;Community Activity;Yard Work;Volunteer;Shop;Laundry    Stability/Clinical Decision Making Stable/Uncomplicated    Rehab Potential Good    PT Frequency 2x / week    PT Duration 8 weeks    PT Treatment/Interventions ADLs/Self Care Home Management;Aquatic Therapy;Cryotherapy;Electrical Stimulation;Moist Heat;Traction;Parrafin;Ultrasound;DME Instruction;Gait training;Stair training;Functional mobility training;Therapeutic activities;Therapeutic exercise;Balance training;Neuromuscular re-education;Patient/family education;Orthotic Fit/Training;Manual techniques;Manual lymph drainage;Compression bandaging;Scar mobilization;Passive range of motion;Dry needling;Energy  conservation;Splinting;Taping;Joint Manipulations;Spinal Manipulations    PT Next Visit Plan Re-assess STG/LTG and D/C if amenable    PT Home Exercise Plan quad set, SLR, heel slides, heel prop, LAQ, ankle pumps, elevation, compression 1/6 heel raise, tandem stance, mini squat, prone knee hang, prone quad    Consulted and Agree with Plan of Care Patient             Patient will benefit from skilled therapeutic intervention in order to improve the following deficits and impairments:  Abnormal gait, Decreased range of motion, Difficulty walking, Decreased endurance, Decreased activity tolerance, Decreased balance, Pain, Impaired flexibility, Improper body mechanics, Decreased mobility, Increased edema, Decreased strength  Visit Diagnosis: Right knee pain, unspecified chronicity  Other abnormalities of gait and mobility  Muscle weakness (generalized)  Other symptoms and signs involving the musculoskeletal system     Problem List Patient Active Problem List   Diagnosis Date Noted   S/P total knee replacement, right 02/08/21 02/22/2021   Osteoarthritis of right knee 02/08/2021   Sepsis due to pneumonia ([place]) 09/09/2020   Depression    Anxiety    Hyperlipidemia    Arthritis    Sarcoidosis    Right inguinal hernia    MDD (major depressive disorder), recurrent episode, moderate (HCC) 08/24/2016   Acute medial meniscus tear of left knee    Tear of lateral meniscus of right knee, current    Primary osteoarthritis of right knee    Deficiency of anterior cruciate ligament of right  knee    Abnormal CT scan, colon 02/01/2011   Constipation 02/01/2011    11:13 AM, 03/24/21 Mearl Latin PT, DPT Physical Therapist at [place], [place], [address] Phone: [phone]   Fax:  [phone]  Name: DEQUANDRE CORDOVA MRN: [id number] Date of Birth: 16-Nov-1950

## 2021-04-20 ENCOUNTER — Other Ambulatory Visit: Payer: Self-pay

## 2021-04-20 ENCOUNTER — Ambulatory Visit: Payer: Medicare HMO | Admitting: Orthopedic Surgery

## 2021-04-20 ENCOUNTER — Encounter: Payer: Self-pay | Admitting: Orthopedic Surgery

## 2021-04-20 DIAGNOSIS — Z96651 Presence of right artificial knee joint: Secondary | ICD-10-CM

## 2021-04-20 NOTE — Progress Notes (Signed)
Chief Complaint  Patient presents with   Post-op Follow-up    Right 02/08/21 TKR improving     Encounter Diagnosis  Name Primary?   S/P total knee replacement, right 02/08/21 Yes    Postop total knee doing great 0 to 130 degree range of motion no pain walking independently follow-up in December for x-ray

## 2021-06-22 ENCOUNTER — Other Ambulatory Visit: Payer: Self-pay | Admitting: Family Medicine

## 2021-06-22 ENCOUNTER — Encounter: Payer: Self-pay | Admitting: Family Medicine

## 2021-06-22 ENCOUNTER — Ambulatory Visit (INDEPENDENT_AMBULATORY_CARE_PROVIDER_SITE_OTHER): Payer: Medicare HMO | Admitting: Family Medicine

## 2021-06-22 VITALS — BP 122/72 | HR 88 | Ht 71.0 in | Wt 167.0 lb

## 2021-06-22 DIAGNOSIS — M545 Low back pain, unspecified: Secondary | ICD-10-CM | POA: Diagnosis not present

## 2021-06-22 DIAGNOSIS — F321 Major depressive disorder, single episode, moderate: Secondary | ICD-10-CM | POA: Diagnosis not present

## 2021-06-22 DIAGNOSIS — Z1159 Encounter for screening for other viral diseases: Secondary | ICD-10-CM

## 2021-06-22 DIAGNOSIS — Z1211 Encounter for screening for malignant neoplasm of colon: Secondary | ICD-10-CM

## 2021-06-22 DIAGNOSIS — Z23 Encounter for immunization: Secondary | ICD-10-CM

## 2021-06-22 DIAGNOSIS — E559 Vitamin D deficiency, unspecified: Secondary | ICD-10-CM | POA: Diagnosis not present

## 2021-06-22 DIAGNOSIS — R7301 Impaired fasting glucose: Secondary | ICD-10-CM

## 2021-06-22 MED ORDER — CYCLOBENZAPRINE HCL 5 MG PO TABS
5.0000 mg | ORAL_TABLET | Freq: Two times a day (BID) | ORAL | 1 refills | Status: DC
Start: 2021-06-22 — End: 2021-09-21

## 2021-06-22 NOTE — Patient Instructions (Addendum)
I appreciate the opportunity to provide care to you today! ?  ?Follow up: 3 months ? ?-Labs: Please stop by the lab to have your blood drawn (CBC, CMP, TSH, HgA1c, Vit.D, and Lipid Profile) ? ?-Screening: Hep C ? ?-Please pick up your prescription for Flexeril for your back pain ? ?-Referrals today-  Physical therapy(back pain), Psychiatrist  and GI (colonoscopy) ? ?-Please stop by [facility] anytime tomorrow to get an x-ray of your lower back ?  ?Thank you for getting your Tdap and P Pneumococcal polysaccharide vaccine (PPSV23) today! ? ? ?  ?It was a pleasure to see you and I look forward to continuing to work together on your health and well-being. ?Please do not hesitate to call the office if you need care or have questions about your care. ?  ?Have a wonderful day and week. ?With Gratitude, ?[name] MSN, FNP-BC  ?

## 2021-06-22 NOTE — Progress Notes (Addendum)
? ?New Patient Office Visit ? ?Subjective:  ?Patient ID: Albert Gonzalez, male    DOB: 08/30/1950  Age: 71 y.o. MRN: [id number] ? ?CC:  ?Chief Complaint  ?Patient presents with  ? New Patient (Initial Visit)  ?  Here to establish care, previously seen by [facility] Emelia Loron)  has been feeling fatigued for about a few weeks, complains of back pain onset 05/25/2021.   ? ? ?HPI ?Albert Gonzalez is a 71 y.o. pleasant male  who was seen today for establishing care. 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 He reports that his wife(Albert Gonzalez) has severe rheumatoid arthritis, and her health has been debilitating. The patient is emotional and cries as he talks about his wife's current state of health. He reports low back pain for about four weeks. He has tried heat therapy, Advil, aspirin, back brace to no avail. He denies recent injury or trauma to the lower back with no systemic symptoms. He denies bladder or bowel incontinence. ?The patient reports increased fatigue and notes that his wife's condition heightens his depression. The patient is receptive to speaking with a psychiatrist. He notes that his daughter is in [place], only he and his wife. ? ?He received the Tdap and Pneumovax 23 vaccine today. ? ?Past Medical History:  ?Diagnosis Date  ? Anxiety   ? Arthritis   ? Depression   ? History of kidney stones   ? Hyperlipidemia   ? Inguinal hernia right   ? Insomnia   ? Myalgia   ? PPD positive, treated 1979  ? 1 year of INH  ? Raynaud phenomenon   ? Sarcoidosis 1980  ? ? ?Past Surgical History:  ?Procedure Laterality Date  ? AXILLARY LYMPH NODE BIOPSY Right 1980  ? COLONOSCOPY  02/08/2011  ? Procedure: COLONOSCOPY;  Surgeon: Daneil Dolin, MD;  Location: [facility];  Service: Endoscopy;  Laterality: N/A;  1:30 PM  ? INGUINAL HERNIA REPAIR Right 01/29/2017  ? Procedure: HERNIA REPAIR INGUINAL ADULT WITH MESH;  Surgeon: Virl Cagey, MD;  Location: [facility] ORS;  Service:  General;  Laterality: Right;  ? KNEE ARTHROSCOPY WITH MEDIAL MENISECTOMY Right 04/05/2016  ? Procedure: RIGHT KNEE ARTHROSCOPY WITH PARTIAL  LATERAL MENISECTOMY, DEBRIDEMENT MEDIAL MENISCUS, ACL DEBRIDEMENT, MICRO FRACTURE OF FEMUR;  Surgeon: Carole Civil, MD;  Location: [facility];  Service: Orthopedics;  Laterality: Right;  ? KNEE SURGERY    ? left  ? KYPHOPLASTY    ? TONSILLECTOMY    ? TOTAL KNEE ARTHROPLASTY Right 02/08/2021  ? Procedure: TOTAL KNEE ARTHROPLASTY;  Surgeon: Carole Civil, MD;  Location: [facility];  Service: Orthopedics;  Laterality: Right;  ? ? ?Family History  ?Problem Relation Age of Onset  ? Suicidality Father   ? Colon cancer Neg Hx   ? Liver disease Neg Hx   ? Inflammatory bowel disease Neg Hx   ? Anesthesia problems Neg Hx   ? ? ?Social History  ? ?Socioeconomic History  ? Marital status: Married  ?  Spouse name: Not on file  ? Number of children: 1  ? Years of education: Not on file  ? Highest education level: Not on file  ?Occupational History  ? Occupation: respiratory therapist  ?  Employer: [employer]  ?Tobacco Use  ? Smoking status: Former  ?  Packs/day: 0.50  ?  Years: 20.00  ?  Pack years: 10.00  ?  Types: Pipe, Cigarettes  ?  Quit date: 04/03/1990  ?  Years since quitting: 31.2  ? Smokeless tobacco: Never  ? Tobacco comments:  ?  quit many years ago  ?Vaping Use  ? Vaping Use: Never used  ?Substance and Sexual Activity  ? Alcohol use: Yes  ?  Comment: occassional  ? Drug use: No  ? Sexual activity: Yes  ?Other Topics Concern  ? Not on file  ?Social History Narrative  ? Not on file  ? ?Social Determinants of Health  ? ?Financial Resource Strain: Not on file  ?Food Insecurity: Not on file  ?Transportation Needs: Not on file  ?Physical Activity: Not on file  ?Stress: Not on file  ?Social Connections: Not on file  ?Intimate Partner Violence: Not on file  ? ? ?ROS ?Review of Systems  ?Constitutional:  Positive for fatigue. Negative for chills and fever.  ?HENT:   Negative for rhinorrhea, sinus pressure, sinus pain, sneezing and sore throat.   ?Eyes:  Negative for discharge, redness and visual disturbance.  ?Respiratory:  Positive for cough (with the pollen) and wheezing. Negative for chest tightness and shortness of breath.   ?Gastrointestinal:  Positive for constipation. Negative for blood in stool, diarrhea, nausea and vomiting.  ?Endocrine: Negative for polydipsia, polyphagia and polyuria.  ?Genitourinary:  Negative for dysuria, frequency and urgency.  ?Musculoskeletal:  Positive for back pain. Negative for arthralgias and neck pain.  ?Skin:  Negative for rash and wound.  ?Neurological:  Positive for dizziness. Negative for tremors, weakness and light-headedness.  ?Psychiatric/Behavioral:  Negative for confusion, self-injury and suicidal ideas.   ? ?Objective:  ? ?Today's Vitals: BP 122/72 (BP Location: Left Arm, Patient Position: Sitting)   Pulse 88   Ht 5' 11"  (1.803 m)   Wt 167 lb (75.8 kg)   SpO2 98%   BMI 23.29 kg/m?  ? ?Physical Exam ?Constitutional:   ?   Appearance: Normal appearance.  ?HENT:  ?   Head: Normocephalic.  ?   Right Ear: External ear normal.  ?   Left Ear: External ear normal.  ?   Nose: Nose normal. No congestion or rhinorrhea.  ?   Mouth/Throat:  ?   Mouth: Mucous membranes are moist.  ?Eyes:  ?   Extraocular Movements: Extraocular movements intact.  ?   Pupils: Pupils are equal, round, and reactive to light.  ?Cardiovascular:  ?   Rate and Rhythm: Normal rate and regular rhythm.  ?   Pulses: Normal pulses.  ?   Heart sounds: Normal heart sounds.  ?Pulmonary:  ?   Effort: Pulmonary effort is normal.  ?   Breath sounds: Normal breath sounds.  ?Abdominal:  ?   Palpations: Abdomen is soft.  ?Musculoskeletal:  ?   Cervical back: No rigidity or tenderness.  ?   Lumbar back: Spasms and tenderness present.  ?   Right lower leg: No edema.  ?   Left lower leg: No edema.  ?Skin: ?   Findings: No lesion or rash.  ?Neurological:  ?   Mental Status: He is  oriented to person, place, and time.  ?Psychiatric:     ?   Behavior: Behavior normal.  ? ? ?Assessment & Plan:  ? ?Problem List Items Addressed This Visit   ? ?  ? Other  ? Depression  ?  -Referral to psychiatry ?-Patient declines pharmacological treatments at the moment ? ?  ?  ? Relevant Orders  ? Ambulatory referral to Psychiatry  ? Bilateral low back pain - Primary  ?  -  Flexeril ordered with imaging of the back ?-Referral to PT ? ?  ?  ? Relevant Medications  ? aspirin 325 MG tablet  ? ibuprofen (ADVIL) 200 MG tablet  ? cyclobenzaprine (FLEXERIL) 5 MG tablet  ? Other Relevant Orders  ? CBC with Differential/Platelet (Completed)  ? CMP14+EGFR (Completed)  ? Lipid Profile (Completed)  ? TSH + free T4 (Completed)  ? Ambulatory referral to Physical Therapy  ? DG Cervical Spine Complete (Completed)  ? ?Other Visit Diagnoses   ? ? Vitamin D deficiency      ? Relevant Orders  ? Vitamin D (25 hydroxy) (Completed)  ? IFG (impaired fasting glucose)      ? Relevant Orders  ? Hemoglobin A1C (Completed)  ? Need for Tdap vaccination      ? Relevant Orders  ? Tdap vaccine greater than or equal to 7yo IM (Completed)  ? Need for pneumococcal vaccination      ? Relevant Orders  ? Pneumococcal polysaccharide vaccine 23-valent greater than or equal to 2yo subcutaneous/IM (Completed)  ? Encounter for hepatitis C screening test for low risk patient      ? Relevant Orders  ? Hepatitis C Antibody (Completed)  ? Screening for colon cancer      ? Relevant Orders  ? CT VIRTUAL COLONOSCOPY DIAGNOSTIC  ? ?  ? ? ?Outpatient Encounter Medications as of 06/22/2021  ?Medication Sig  ? albuterol (VENTOLIN HFA) 108 (90 Base) MCG/ACT inhaler 2 puffs every 4 (four) hours as needed for wheezing or shortness of breath.  ? ALPRAZolam (XANAX) 0.5 MG tablet Take 1 tablet (0.5 mg total) by mouth daily as needed for anxiety.  ? aspirin 325 MG tablet Take 325 mg by mouth in the morning and at bedtime.  ? atorvastatin (LIPITOR) 40 MG tablet Take 40 mg daily  at 6 PM by mouth.   ? busPIRone (BUSPAR) 10 MG tablet Take 10 mg by mouth 3 (three) times daily.  ? Calcium Citrate-Vitamin D (CALCIUM + D PO) Take 2 tablets by mouth daily.  ? cyclobenzaprine (FLEXERIL) 5 MG tabl

## 2021-06-22 NOTE — Addendum Note (Signed)
Addended by: Eual Fines on: 06/22/2021 04:55 PM ? ? Modules accepted: Orders ? ?

## 2021-06-23 ENCOUNTER — Ambulatory Visit (HOSPITAL_COMMUNITY)
Admission: RE | Admit: 2021-06-23 | Discharge: 2021-06-23 | Disposition: A | Discharge status: Home / Self Care | Payer: Medicare HMO | Source: Ambulatory Visit | Attending: Family Medicine | Admitting: Family Medicine

## 2021-06-23 ENCOUNTER — Telehealth: Payer: Self-pay

## 2021-06-23 ENCOUNTER — Other Ambulatory Visit: Payer: Self-pay

## 2021-06-23 DIAGNOSIS — M545 Low back pain, unspecified: Secondary | ICD-10-CM | POA: Insufficient documentation

## 2021-06-23 NOTE — Assessment & Plan Note (Signed)
-  Referral to psychiatry ?-Patient declines pharmacological treatments at the moment ?

## 2021-06-23 NOTE — Assessment & Plan Note (Addendum)
-  Flexeril ordered with imaging of the back ?-Referral to PT ?

## 2021-06-24 ENCOUNTER — Other Ambulatory Visit: Payer: Self-pay | Admitting: Family Medicine

## 2021-06-24 DIAGNOSIS — M5136 Other intervertebral disc degeneration, lumbar region: Secondary | ICD-10-CM

## 2021-06-24 DIAGNOSIS — D509 Iron deficiency anemia, unspecified: Secondary | ICD-10-CM

## 2021-06-24 LAB — CBC WITH DIFFERENTIAL/PLATELET
Basophils Absolute: 0.1 10*3/uL (ref 0.0–0.2)
Basos: 1 %
EOS (ABSOLUTE): 0.7 10*3/uL — ABNORMAL HIGH (ref 0.0–0.4)
Eos: 6 %
Hematocrit: 31.7 % — ABNORMAL LOW (ref 37.5–51.0)
Hemoglobin: 9.5 g/dL — ABNORMAL LOW (ref 13.0–17.7)
Immature Grans (Abs): 0 10*3/uL (ref 0.0–0.1)
Immature Granulocytes: 0 %
Lymphocytes Absolute: 1.6 10*3/uL (ref 0.7–3.1)
Lymphs: 13 %
MCH: 20.3 pg — ABNORMAL LOW (ref 26.6–33.0)
MCHC: 30 g/dL — ABNORMAL LOW (ref 31.5–35.7)
MCV: 68 fL — ABNORMAL LOW (ref 79–97)
Monocytes Absolute: 0.9 10*3/uL (ref 0.1–0.9)
Monocytes: 8 %
Neutrophils Absolute: 8.7 10*3/uL — ABNORMAL HIGH (ref 1.4–7.0)
Neutrophils: 72 %
Platelets: 606 10*3/uL — ABNORMAL HIGH (ref 150–450)
RBC: 4.67 x10E6/uL (ref 4.14–5.80)
RDW: 18.5 % — ABNORMAL HIGH (ref 11.6–15.4)
WBC: 12 10*3/uL — ABNORMAL HIGH (ref 3.4–10.8)

## 2021-06-24 LAB — CMP14+EGFR
ALT: 14 IU/L (ref 0–44)
AST: 21 IU/L (ref 0–40)
Albumin/Globulin Ratio: 2.2 (ref 1.2–2.2)
Albumin: 4.1 g/dL (ref 3.7–4.7)
Alkaline Phosphatase: 131 IU/L — ABNORMAL HIGH (ref 44–121)
BUN/Creatinine Ratio: 17 (ref 10–24)
BUN: 16 mg/dL (ref 8–27)
Bilirubin Total: 0.4 mg/dL (ref 0.0–1.2)
CO2: 22 mmol/L (ref 20–29)
Calcium: 9.5 mg/dL (ref 8.6–10.2)
Chloride: 106 mmol/L (ref 96–106)
Creatinine, Ser: 0.92 mg/dL (ref 0.76–1.27)
Globulin, Total: 1.9 g/dL (ref 1.5–4.5)
Glucose: 86 mg/dL (ref 70–99)
Potassium: 5.2 mmol/L (ref 3.5–5.2)
Sodium: 140 mmol/L (ref 134–144)
Total Protein: 6 g/dL (ref 6.0–8.5)
eGFR: 88 mL/min/{1.73_m2} (ref >59)

## 2021-06-24 LAB — LIPID PANEL
Chol/HDL Ratio: 4.1 ratio (ref 0.0–5.0)
Cholesterol, Total: 146 mg/dL (ref 100–199)
HDL: 36 mg/dL — ABNORMAL LOW (ref >39)
LDL Chol Calc (NIH): 94 mg/dL (ref 0–99)
Triglycerides: 81 mg/dL (ref 0–149)
VLDL Cholesterol Cal: 16 mg/dL (ref 5–40)

## 2021-06-24 LAB — HEPATITIS C ANTIBODY: Hep C Virus Ab: NONREACTIVE

## 2021-06-24 LAB — TSH+FREE T4
Free T4: 1.09 ng/dL (ref 0.82–1.77)
TSH: 1.53 u[IU]/mL (ref 0.450–4.500)

## 2021-06-24 LAB — HEMOGLOBIN A1C
Est. average glucose Bld gHb Est-mCnc: 105 mg/dL
Hgb A1c MFr Bld: 5.3 % (ref 4.8–5.6)

## 2021-06-24 LAB — VITAMIN D 25 HYDROXY (VIT D DEFICIENCY, FRACTURES): Vit D, 25-Hydroxy: 35 ng/mL (ref 30.0–100.0)

## 2021-06-24 MED ORDER — FUSION PLUS PO CAPS: ORAL_CAPSULE | ORAL | 3 refills | Status: DC

## 2021-07-04 ENCOUNTER — Ambulatory Visit (INDEPENDENT_AMBULATORY_CARE_PROVIDER_SITE_OTHER): Payer: Medicare HMO | Admitting: Pulmonary Disease

## 2021-07-04 ENCOUNTER — Encounter: Payer: Self-pay | Admitting: Primary Care

## 2021-07-04 ENCOUNTER — Ambulatory Visit: Payer: Medicare HMO | Admitting: Primary Care

## 2021-07-04 VITALS — BP 104/64 | HR 83 | Temp 97.7°F | Ht 71.0 in | Wt 168.0 lb

## 2021-07-04 DIAGNOSIS — R0683 Snoring: Secondary | ICD-10-CM | POA: Diagnosis not present

## 2021-07-04 DIAGNOSIS — D869 Sarcoidosis, unspecified: Secondary | ICD-10-CM

## 2021-07-04 DIAGNOSIS — J45909 Unspecified asthma, uncomplicated: Secondary | ICD-10-CM

## 2021-07-04 LAB — PULMONARY FUNCTION TEST
DL/VA % pred: 92 %
DL/VA: 3.68 ml/min/mmHg/L
DLCO cor % pred: 72 %
DLCO cor: 19.16 ml/min/mmHg
DLCO unc % pred: 59 %
DLCO unc: 15.69 ml/min/mmHg
FEF 25-75 Post: 5.36 L/sec
FEF 25-75 Pre: 3.52 L/sec
FEF2575-%Change-Post: 52 %
FEF2575-%Pred-Post: 217 %
FEF2575-%Pred-Pre: 142 %
FEV1-%Change-Post: 3 %
FEV1-%Pred-Post: 96 %
FEV1-%Pred-Pre: 92 %
FEV1-Post: 3.18 L
FEV1-Pre: 3.07 L
FEV1FVC-%Change-Post: 5 %
FEV1FVC-%Pred-Pre: 116 %
FEV6-%Change-Post: -1 %
FEV6-%Pred-Post: 82 %
FEV6-%Pred-Pre: 84 %
FEV6-Post: 3.52 L
FEV6-Pre: 3.59 L
FEV6FVC-%Pred-Post: 106 %
FEV6FVC-%Pred-Pre: 106 %
FVC-%Change-Post: -1 %
FVC-%Pred-Post: 77 %
FVC-%Pred-Pre: 79 %
FVC-Post: 3.52 L
FVC-Pre: 3.59 L
Post FEV1/FVC ratio: 90 %
Post FEV6/FVC ratio: 100 %
Pre FEV1/FVC ratio: 85 %
Pre FEV6/FVC Ratio: 100 %
RV % pred: 39 %
RV: 1 L
TLC % pred: 63 %
TLC: 4.62 L

## 2021-07-04 NOTE — Assessment & Plan Note (Signed)
-   Patient has symptoms of loud snoring, restless sleep and daytime sleepiness. Concern patient could have obstructive sleep apnea, needs home sleep study to evaluate. Discussed risk of untreated sleep apnea including cardiac arrhythmias, pulm HTN, stroke, DM. We briefly reviewed treatment options. Encouraged patient to work on weight loss efforts and focus on side sleeping position/elevate head of bed. Advised against driving if experiencing excessive daytime sleepiness. Follow-up in 4-6 weeks to review sleep study results and discuss treatment options further. ?

## 2021-07-04 NOTE — Assessment & Plan Note (Signed)
-   Patient had positive PPD resting in 1979, treated with INH for 1 year. FU CXR showed new hilar adenopathy, Right axillary lymph node bx was consistent with sarcoidosis. He received no specific therapy for this. He has no chest discomfort or cough. Increased dyspnea last year while working outside but he has a hx of asthma and is not using ICS/LABA as directed. PFTs testing today showed moderately decreased TLC and mild diffusion defect. He is overdue for chest imaging, needs HRCT.  ?

## 2021-07-04 NOTE — Progress Notes (Signed)
Full PFT performed today. °

## 2021-07-04 NOTE — Progress Notes (Signed)
Reviewed and agree with assessment/plan. ? ? ?Rowyn Spilde, MD ?[facility] ?07/04/2021, 7:08 PM ?Pager:  [phone] ? ?

## 2021-07-04 NOTE — Patient Instructions (Signed)
Full PFT performed today. °

## 2021-07-04 NOTE — Progress Notes (Signed)
@Patient  ID: Albert Gonzalez, male    DOB: 09/02/1950, 71 y.o.   MRN: [id number]  Chief Complaint  Patient presents with   Follow-up    PFT performed today.  Pt states he has been doing okay since last visit. States if it is cold weather, he will become SOB.    Referring provider: Carmel Sacramento, NP  HPI: 71 year old male, former smoker. PMH significant for asthma, sarcoidosis, snoring, anxiety, OA, nephrolithiasis, insomnia, depression, HLD. Patient of Dr. Craige Cotta, seen for initial consult on 12/29/20.   Previous [facility] pulmonary encounter  12/29/20- Dr. Craige Cotta  He is here with his wife.  He his a retired Charity fundraiser and Buyer, retail.  He was found to be PPD positive in 1979.  He was seen by Dr. Priscille Heidelberg with pulmonary then.  Treated with INH for 1 year.  Follow up chest xray in 1980 showed new hilar adenopathy.  He had right axillary lymph node biopsy and this was consistent with sarcoidosis.  He didn't need specific therapy and didn't have any symptoms from sarcoidosis.  He was doing well until he was in hospital in July 2022 for fever from pneumonia.  Chest xray from 09/09/20 showed coarse b/l interstitial opacities Rt > Lt.  Since then he developed a dry cough.  This would get worse with allergies and when he was working outside.  His PCP prescribed wixela and this has helped.  He isn't needing to use albuterol as much since he started wixela.  No having sinus congestion, sore throat, dysphagia, reflux, chest pain, wheeze, skin rash, leg swelling, joint swelling, or hemoptysis.  He has been snoring at night.  He gets tired during the day.  He was told he needed a sleepy study, but wants to wait to get this scheduled.  07/04/2021- Interim hx  Patient presents today for overdue follow-up. He was last seen in November, recommended to return in 8 weeks with PFTs. There was a delay in his follow-up d.t his wife having RA and he also had a total knee replacement. He is more short winded recently when  working in the yard or when it is cold. Intermittent chest tightness. He is maintained on Wixela 250-69mcg one puff twice daily for asthma but not using it like he should. States that he can at times be non-compliant. He uses albuterol rescue inhaler 1-2 times daily with improvement.  He was ordered for [facility] to be [facility], does not appear this was done.   He does continue to snore. He has some daytime sleepiness, takes naps daily. His sleep is fragment, his wife has to wake up every hour to use the restroom.   Imaging: 09/09/20 CXR >> IMPRESSION:Coarse bilateral pulmonary interstitial opacity is new since 2011, greater in the right lung, and nonspecific. This could be chronic interstitial lung disease, but acute viral/atypical respiratory infection, or less likely asymmetric edema, are not excluded  Pulmonary function testing: 07/04/2021 >> FVC 3.52 (77%), FEV1 3.18 (96%), ratio 90, TLC 63, DLCOcor 19.16 (72%) Decreased TLC and diffusion capacity    Allergies  Allergen Reactions   Sulfa Antibiotics Rash    Childhood     Immunization History  Administered Date(s) Administered   Moderna Sars-Covid-2 Vaccination 05/30/2019, 07/02/2019   Pneumococcal Polysaccharide-23 06/22/2021   Tdap 06/22/2021    Past Medical History:  Diagnosis Date   Anxiety    Arthritis    Depression    History of kidney stones    Hyperlipidemia  Inguinal hernia right    Insomnia    Myalgia    PPD positive, treated 1979   1 year of INH   Raynaud phenomenon    Sarcoidosis 1980    Tobacco History: Social History   Tobacco Use  Smoking Status Former   Packs/day: 0.50   Years: 20.00   Pack years: 10.00   Types: Pipe, Cigarettes   Quit date: 04/03/1990   Years since quitting: 31.2  Smokeless Tobacco Never  Tobacco Comments   quit many years ago   Counseling given: Not Answered Tobacco comments: quit many years ago   Outpatient Medications Prior to Visit  Medication Sig Dispense  Refill   albuterol (VENTOLIN HFA) 108 (90 Base) MCG/ACT inhaler 2 puffs every 4 (four) hours as needed for wheezing or shortness of breath.     ALPRAZolam (XANAX) 0.5 MG tablet Take 1 tablet (0.5 mg total) by mouth daily as needed for anxiety. 5 tablet 0   aspirin 325 MG tablet Take 325 mg by mouth in the morning and at bedtime.     atorvastatin (LIPITOR) 40 MG tablet Take 40 mg daily at 6 PM by mouth.      busPIRone (BUSPAR) 10 MG tablet Take 10 mg by mouth 3 (three) times daily.     Calcium Citrate-Vitamin D (CALCIUM + D PO) Take 2 tablets by mouth daily.     cyclobenzaprine (FLEXERIL) 5 MG tablet Take 1 tablet (5 mg total) by mouth in the morning and at bedtime. 30 tablet 1   DULoxetine (CYMBALTA) 60 MG capsule Take 1 capsule (60 mg total) by mouth daily. Total of 90 mg daily (60 mg + 30 mg)     fluticasone-salmeterol (ADVAIR) 250-50 MCG/ACT AEPB Inhale 1 puff into the lungs in the morning and at bedtime.     ibuprofen (ADVIL) 200 MG tablet Take 200 mg by mouth every 6 (six) hours as needed.     Iron-FA-B Cmp-C-Biot-Probiotic (FUSION PLUS) CAPS Take 1 (one) tablet by mouth twice daily 30 capsule 3   Misc Natural Products (GLUCOSAMINE CHOND DOUBLE STR PO) Take 2 tablets by mouth daily.     Multiple Vitamin (MULTIVITAMIN) tablet Take 1 tablet by mouth daily. Men's 50+     Multiple Vitamins-Minerals (EQ VISION FORMULA 50+ PO) Take 1 tablet by mouth daily.     polyethylene glycol (MIRALAX / GLYCOLAX) 17 g packet Take 17 g by mouth daily as needed for mild constipation. 14 each 0   tiZANidine (ZANAFLEX) 4 MG tablet Take 4 mg by mouth 3 (three) times daily as needed for muscle spasms.     docusate sodium (COLACE) 100 MG capsule Take 1 capsule (100 mg total) by mouth 2 (two) times daily. 10 capsule 0   traMADol (ULTRAM) 50 MG tablet TAKE ONE TABLET (50MG  TOTAL) BY MOUTH EVERY SIX HOURS 30 tablet 0   traZODone (DESYREL) 50 MG tablet Take 50 mg by mouth at bedtime.     Facility-Administered  Medications Prior to Visit  Medication Dose Route Frequency Provider Last Rate Last Admin   bupivacaine-meloxicam ER (ZYNRELEF) injection 400 mg  400 mg Infiltration Once Vickki Hearing, MD       Review of Systems  Review of Systems  Constitutional:  Positive for fatigue.  HENT: Negative.    Respiratory:  Positive for shortness of breath. Negative for cough, chest tightness and wheezing.   Cardiovascular: Negative.     Physical Exam  BP 104/64 (BP Location: Left Arm, Patient Position: Sitting, Cuff  Size: Normal)   Pulse 83   Temp 97.7 F (36.5 C) (Oral)   Ht 5\' 11"  (1.803 m)   Wt 168 lb (76.2 kg)   SpO2 97% Comment: RA  BMI 23.43 kg/m  Physical Exam Constitutional:      Appearance: Normal appearance.  HENT:     Head: Normocephalic and atraumatic.     Mouth/Throat:     Mouth: Mucous membranes are moist.     Pharynx: Oropharynx is clear.  Cardiovascular:     Rate and Rhythm: Normal rate and regular rhythm.  Pulmonary:     Effort: Pulmonary effort is normal.     Breath sounds: Normal breath sounds. No wheezing, rhonchi or rales.  Musculoskeletal:        General: Normal range of motion.  Skin:    General: Skin is warm and dry.  Neurological:     General: No focal deficit present.     Mental Status: He is alert and oriented to person, place, and time. Mental status is at baseline.  Psychiatric:        Mood and Affect: Mood normal.        Behavior: Behavior normal.        Thought Content: Thought content normal.        Judgment: Judgment normal.     Lab Results:  CBC    Component Value Date/Time   WBC 12.0 (H) 06/23/2021 0944   WBC 13.6 (H) 02/09/2021 0430   RBC 4.67 06/23/2021 0944   RBC 3.63 (L) 02/09/2021 0430   HGB 9.5 (L) 06/23/2021 0944   HCT 31.7 (L) 06/23/2021 0944   PLT 606 (H) 06/23/2021 0944   MCV 68 (L) 06/23/2021 0944   MCH 20.3 (L) 06/23/2021 0944   MCH 22.9 (L) 02/09/2021 0430   MCHC 30.0 (L) 06/23/2021 0944   MCHC 30.3 02/09/2021 0430    RDW 18.5 (H) 06/23/2021 0944   LYMPHSABS 1.6 06/23/2021 0944   MONOABS 0.7 02/04/2021 1118   EOSABS 0.7 (H) 06/23/2021 0944   BASOSABS 0.1 06/23/2021 0944    BMET    Component Value Date/Time   NA 140 06/23/2021 0944   K 5.2 06/23/2021 0944   CL 106 06/23/2021 0944   CO2 22 06/23/2021 0944   GLUCOSE 86 06/23/2021 0944   GLUCOSE 149 (H) 02/09/2021 0430   BUN 16 06/23/2021 0944   CREATININE 0.92 06/23/2021 0944   CALCIUM 9.5 06/23/2021 0944   GFRNONAA >60 02/09/2021 0430   GFRAA >60 01/24/2017 1356    BNP No results found for: BNP  ProBNP No results found for: PROBNP  Imaging: DG Cervical Spine Complete  Result Date: 06/23/2021 CLINICAL DATA:  Chronic back pain EXAM: CERVICAL SPINE - COMPLETE 4+ VIEW COMPARISON:  None. FINDINGS: Straightening of the cervical spine. Trace anterolisthesis C5 on C6 likely degenerative. Diffuse degenerative osteophytes throughout. Mild disc space narrowing C5-C6 and C7-T1 with moderate disc space narrowing C6-C7. Bilateral foraminal narrowing at C5-C6. Dens and lateral masses are within normal limits. Bilateral carotid vascular calcification IMPRESSION: Straightening of the cervical spine with multilevel degenerative changes Electronically Signed   By: Jasmine Pang M.D.   On: 06/23/2021 23:30   DG Thoracic Spine W/Swimmers  Result Date: 06/23/2021 CLINICAL DATA:  Back pain EXAM: THORACIC SPINE - 3 VIEWS COMPARISON:  None. FINDINGS: Mild dextroscoliosis. Vertebral body heights are grossly maintained. Diffuse degenerative osteophytes. IMPRESSION: Degenerative changes and scoliosis.  No acute osseous abnormality Electronically Signed   By: Adrian Prows.D.  On: 06/23/2021 23:33   DG Lumbar Spine Complete  Result Date: 06/23/2021 CLINICAL DATA:  Back pain EXAM: LUMBAR SPINE - COMPLETE 4+ VIEW COMPARISON:  08/10/2014 FINDINGS: Lumbar alignment within normal limits. Treated compression deformity at L1. Mild superior endplate deformity at L4, new  compared to 2016 radiograph. Multilevel degenerative osteophyte. Mild disc space narrowing at L2-L3 and L3-L4. Facet degenerative changes of the lower lumbar spine. IMPRESSION: 1. Mild superior endplate fracture at L4 of uncertain acuity but is new since 2016. 2. Treated compression deformity at L1. Electronically Signed   By: Jasmine Pang M.D.   On: 06/23/2021 23:32     Assessment & Plan:   Snoring - Patient has symptoms of loud snoring, restless sleep and daytime sleepiness. Concern patient could have obstructive sleep apnea, needs home sleep study to evaluate. Discussed risk of untreated sleep apnea including cardiac arrhythmias, pulm HTN, stroke, DM. We briefly reviewed treatment options. Encouraged patient to work on weight loss efforts and focus on side sleeping position/elevate head of bed. Advised against driving if experiencing excessive daytime sleepiness. Follow-up in 4-6 weeks to review sleep study results and discuss treatment options further.  Sarcoidosis - Patient had positive PPD resting in 1979, treated with INH for 1 year. FU CXR showed new hilar adenopathy, Right axillary lymph node bx was consistent with sarcoidosis. He received no specific therapy for this. He has no chest discomfort or cough. Increased dyspnea last year while working outside but he has a hx of asthma and is not using ICS/LABA as directed. PFTs testing today showed moderately decreased TLC and mild diffusion defect. He is overdue for chest imaging, needs HRCT.   Asthma - Not currently exacerbated. Intermittent dyspnea and wheezing. Lungs were clear on exam today. He is using SABA 1-2 times a day but not consistently taking ICS/LABA as prescribed. Encourage patient use Wixela 250-71mcg consistently twice daily and prn  40 mins spent on case: > 50% face to face with patient   Glenford Bayley, NP 07/04/2021

## 2021-07-04 NOTE — Patient Instructions (Addendum)
Pulmonary function testing showed mild restriction, decreased total lung capacity and mild diffusion capacity  ? ?Orders: ?HRCT re: sarcoidosis  ?Polysomnography re: snoring  ? ?Follow-up: ?8 weeks with Dr. Halford Chessman or Eustaquio Maize  ? ? ? ?

## 2021-07-04 NOTE — Assessment & Plan Note (Signed)
-   Not currently exacerbated. Intermittent dyspnea and wheezing. Lungs were clear on exam today. He is using SABA 1-2 times a day but not consistently taking ICS/LABA as prescribed. Encourage patient use Wixela 250-67mg consistently twice daily and prn ?

## 2021-07-12 ENCOUNTER — Other Ambulatory Visit: Payer: Self-pay

## 2021-07-12 DIAGNOSIS — D509 Iron deficiency anemia, unspecified: Secondary | ICD-10-CM

## 2021-07-12 MED ORDER — FUSION PLUS PO CAPS: ORAL_CAPSULE | ORAL | 3 refills | Status: DC

## 2021-07-19 ENCOUNTER — Ambulatory Visit (HOSPITAL_COMMUNITY): Payer: Self-pay | Admitting: Clinical

## 2021-07-26 ENCOUNTER — Other Ambulatory Visit: Payer: Self-pay

## 2021-07-26 ENCOUNTER — Telehealth: Payer: Self-pay | Admitting: Family Medicine

## 2021-07-26 DIAGNOSIS — D509 Iron deficiency anemia, unspecified: Secondary | ICD-10-CM

## 2021-07-26 MED ORDER — FUSION PLUS PO CAPS: ORAL_CAPSULE | ORAL | 3 refills | Status: DC

## 2021-07-26 NOTE — Telephone Encounter (Signed)
Pt is needing the rx for 30 Fusion Plus capsules, 2x daily (every 12 hrs) rx sent to [facility]. States it is too expensive at [facility]. Can you please resend to [facility]?    [place] Phar

## 2021-07-26 NOTE — Telephone Encounter (Signed)
Rx sent 

## 2021-07-26 NOTE — Progress Notes (Signed)
Refill

## 2021-07-27 ENCOUNTER — Encounter: Payer: Self-pay | Admitting: *Deleted

## 2021-08-01 ENCOUNTER — Telehealth: Payer: Self-pay | Admitting: Family Medicine

## 2021-08-01 ENCOUNTER — Telehealth: Payer: Self-pay

## 2021-08-01 NOTE — Telephone Encounter (Signed)
Pt called stating that [facility] never received the referral. They gave him another fax #. Can you please resend this referral?  ATTN: [place] Fax # [phone]

## 2021-08-01 NOTE — Telephone Encounter (Signed)
Patient called need for nurse to return his call about the referral that was discuss. Has not heard back. Call back # [phone].

## 2021-08-01 NOTE — Telephone Encounter (Signed)
Spoke with pt provided him with the phone number to [place].

## 2021-08-02 NOTE — Telephone Encounter (Signed)
Refaxed to [phone] [facility]

## 2021-08-03 ENCOUNTER — Encounter: Payer: Medicare HMO | Admitting: Pulmonary Disease

## 2021-08-04 ENCOUNTER — Other Ambulatory Visit: Payer: Self-pay | Admitting: Family Medicine

## 2021-08-04 ENCOUNTER — Encounter (HOSPITAL_COMMUNITY): Payer: Self-pay

## 2021-08-04 ENCOUNTER — Ambulatory Visit (HOSPITAL_COMMUNITY): Payer: Self-pay | Admitting: Clinical

## 2021-08-09 ENCOUNTER — Encounter: Payer: Self-pay | Admitting: *Deleted

## 2021-08-09 NOTE — Patient Instructions (Signed)
Referring MD/PCP: [name]  Procedure: Colonoscopy  Has patient had this procedure before?  Dr. Gala Romney, 02/08/11  If so, when, by whom and where?    Is there a family history of colon cancer?  no  Who?  What age when diagnosed?    Is patient diabetic? If yes, Type 1 or Type 2   no      Does patient have prosthetic heart valve or mechanical valve?  no  Do you have a pacemaker/defibrillator?  no  Has patient ever had endocarditis/atrial fibrillation? no  Does patient use oxygen? no  Has patient had joint replacement within last 12 months?  Yes, total knee replacement 02/08/21  Is patient constipated or do they take laxatives? yes  Does patient have a history of alcohol/drug use?  no  Have you had a stroke/heart attack last 6 mths? no  Do you take medicine for weight loss?  no   Is patient on blood thinner such as Coumadin, Plavix and/or Aspirin? Yes, aspirin '325mg'$   Medications:  Current Outpatient Medications on File Prior to Visit  Medication Sig Dispense Refill   albuterol (VENTOLIN HFA) 108 (90 Base) MCG/ACT inhaler 2 puffs every 4 (four) hours as needed for wheezing or shortness of breath.     ALPRAZolam (XANAX) 0.5 MG tablet Take 1 tablet (0.5 mg total) by mouth daily as needed for anxiety. 5 tablet 0   aspirin 325 MG tablet Take 325 mg by mouth in the morning and at bedtime.     atorvastatin (LIPITOR) 40 MG tablet Take 40 mg daily at 6 PM by mouth.      busPIRone (BUSPAR) 10 MG tablet Take 10 mg by mouth 3 (three) times daily.     Calcium Citrate-Vitamin D (CALCIUM + D PO) Take 2 tablets by mouth daily.     cyclobenzaprine (FLEXERIL) 5 MG tablet Take 1 tablet (5 mg total) by mouth in the morning and at bedtime. 30 tablet 1   DULoxetine (CYMBALTA) 60 MG capsule Take 1 capsule (60 mg total) by mouth daily. Total of 90 mg daily (60 mg + 30 mg)     fluticasone-salmeterol (ADVAIR) 250-50 MCG/ACT AEPB Inhale 1 puff into the lungs in the morning and at bedtime.      ibuprofen (ADVIL) 200 MG tablet Take 200 mg by mouth every 6 (six) hours as needed.     Iron-FA-B Cmp-C-Biot-Probiotic (FUSION PLUS) CAPS Take 1 (one) tablet by mouth twice daily 30 capsule 3   Misc Natural Products (GLUCOSAMINE CHOND DOUBLE STR PO) Take 2 tablets by mouth daily.     Multiple Vitamin (MULTIVITAMIN) tablet Take 1 tablet by mouth daily. Men's 50+     Multiple Vitamins-Minerals (EQ VISION FORMULA 50+ PO) Take 1 tablet by mouth daily.     polyethylene glycol (MIRALAX / GLYCOLAX) 17 g packet Take 17 g by mouth daily as needed for mild constipation. 14 each 0   tiZANidine (ZANAFLEX) 4 MG tablet Take 4 mg by mouth 3 (three) times daily as needed for muscle spasms.     Current Facility-Administered Medications on File Prior to Visit  Medication Dose Route Frequency Provider Last Rate Last Admin   bupivacaine-meloxicam ER (ZYNRELEF) injection 400 mg  400 mg Infiltration Once Carole Civil, MD         Allergies:  Allergies  Allergen Reactions   Sulfa Antibiotics Rash    Childhood

## 2021-08-10 ENCOUNTER — Ambulatory Visit: Payer: Medicare HMO | Attending: Primary Care | Admitting: Pulmonary Disease

## 2021-08-10 DIAGNOSIS — R0683 Snoring: Secondary | ICD-10-CM | POA: Insufficient documentation

## 2021-08-10 DIAGNOSIS — G4761 Periodic limb movement disorder: Secondary | ICD-10-CM | POA: Diagnosis not present

## 2021-08-11 NOTE — Procedures (Signed)
     Patient Name: Albert Gonzalez, [place] Date: 08/10/2021 Gender: Male D.O.B: [birth year] Age (years): 71 Referring Provider: Geraldo Pitter NP Height (inches): 71 Interpreting Physician: Chesley Mires MD, ABSM Weight (lbs): 167 RPSGT: Rosebud Poles BMI: 23 MRN: [id number] Neck Size: 15.00  CLINICAL INFORMATION Sleep Study Type: NPSG  Indication for sleep study: snoring, sleep disruption and daytime sleepiness.  Epworth Sleepiness Score: 5  SLEEP STUDY TECHNIQUE As per the AASM Manual for the Scoring of Sleep and Associated Events v2.3 (April 2016) with a hypopnea requiring 4% desaturations.  The channels recorded and monitored were frontal, central and occipital EEG, electrooculogram (EOG), submentalis EMG (chin), nasal and oral airflow, thoracic and abdominal wall motion, anterior tibialis EMG, snore microphone, electrocardiogram, and pulse oximetry.  MEDICATIONS Medications self-administered by patient taken the night of the study : N/A  SLEEP ARCHITECTURE The study was initiated at 10:35:19 PM and ended at 5:12:51 AM.  Sleep onset time was 11.8 minutes and the sleep efficiency was 85.1%. The total sleep time was 338.3 minutes.  Stage REM latency was N/A minutes.  The patient spent 11.97% of the night in stage N1 sleep, 88.03% in stage N2 sleep, 0.00% in stage N3 and 0% in REM.  Alpha intrusion was absent.  Supine sleep was 17.00%.  RESPIRATORY PARAMETERS The overall apnea/hypopnea index (AHI) was 1.8 per hour. There were 1 total apneas, including 0 obstructive, 1 central and 0 mixed apneas. There were 9 hypopneas and 33 RERAs.  The AHI during Stage REM sleep was N/A per hour.  AHI while supine was 10.4 per hour.  The mean oxygen saturation was 94.93%. The minimum SpO2 during sleep was 90.00%.  moderate snoring was noted during this study.  CARDIAC DATA The 2 lead EKG demonstrated sinus rhythm. The mean heart rate was 70.79 beats per minute. Other EKG  findings include: None.  LEG MOVEMENT DATA The total PLMS were 226 with a resulting PLMS index of 40.09. Associated arousal with leg movement index was 2.5 .  IMPRESSIONS - No significant obstructive sleep apnea occurred during this study (AHI = 1.8/h). - The patient had minimal or no oxygen desaturation during the study (Min O2 = 90.00%) - The patient snored with moderate snoring volume. - Moderate periodic limb movements of sleep occurred during the study.  DIAGNOSIS - Snoring - Periodic Limb Movements of Sleep  RECOMMENDATIONS - Assess for presence of restless leg syndrome. - Avoid alcohol, sedatives and other CNS depressants that may worsen sleep apnea and disrupt normal sleep architecture. - Sleep hygiene should be reviewed to assess factors that may improve sleep quality. - Weight management and regular exercise should be initiated or continued if appropriate.  [Electronically signed] 08/11/2021 02:11 PM  Chesley Mires MD, ABSM Diplomate, American Board of Sleep Medicine NPI: [id number]  [place] PH: [phone]   FX: [phone] [place]

## 2021-08-12 ENCOUNTER — Ambulatory Visit (HOSPITAL_COMMUNITY)
Admission: RE | Admit: 2021-08-12 | Discharge: 2021-08-12 | Disposition: A | Discharge status: Home / Self Care | Payer: Medicare HMO | Source: Ambulatory Visit | Attending: Primary Care | Admitting: Primary Care

## 2021-08-12 DIAGNOSIS — D869 Sarcoidosis, unspecified: Secondary | ICD-10-CM | POA: Insufficient documentation

## 2021-08-16 NOTE — Progress Notes (Signed)
Hold iron 7 days. For one week before procedure, decrease ASA to '325mg'$  once daily only. ASA 3. Continue miralax one capful once to twice daily to maintain regular soft stools especially for two weeks before colonoscopy.

## 2021-08-17 NOTE — Progress Notes (Signed)
Will call patient to schedule once we receive Dr. Roseanne Kaufman future schedule

## 2021-08-19 ENCOUNTER — Telehealth: Payer: Self-pay | Admitting: Primary Care

## 2021-08-22 ENCOUNTER — Ambulatory Visit (INDEPENDENT_AMBULATORY_CARE_PROVIDER_SITE_OTHER): Payer: Medicare HMO

## 2021-08-22 DIAGNOSIS — Z0000 Encounter for general adult medical examination without abnormal findings: Secondary | ICD-10-CM | POA: Diagnosis not present

## 2021-08-22 NOTE — Progress Notes (Signed)
Subjective:   Albert Gonzalez is a 71 y.o. male who presents for an Initial Medicare Annual Wellness Visit. I connected with  Minda Ditto on 08/22/21 by a audio enabled telemedicine application and verified that I am speaking with the correct person using two identifiers.  Patient Location: Home  Provider Location: Office/Clinic  I discussed the limitations of evaluation and management by telemedicine. The patient expressed understanding and agreed to proceed.  Review of Systems          Objective:    There were no vitals filed for this visit. There is no height or weight on file to calculate BMI.     02/14/2021    2:48 PM 02/08/2021    3:00 PM 02/04/2021   11:44 AM 09/09/2020    9:22 AM 01/24/2017    1:59 PM 04/05/2016    6:27 AM 04/03/2016   11:11 AM  Advanced Directives  Does Patient Have a Medical Advance Directive? No No No No No No No  Does patient want to make changes to medical advance directive?       Yes (ED - Information included in AVS)  Would patient like information on creating a medical advance directive? Yes (MAU/Ambulatory/Procedural Areas - Information given) No - Patient declined No - Patient declined  No - Patient declined  Yes (ED - Information included in AVS)    Current Medications (verified) Outpatient Encounter Medications as of 08/22/2021  Medication Sig   albuterol (VENTOLIN HFA) 108 (90 Base) MCG/ACT inhaler 2 puffs every 4 (four) hours as needed for wheezing or shortness of breath.   ALPRAZolam (XANAX) 0.5 MG tablet Take 1 tablet (0.5 mg total) by mouth daily as needed for anxiety.   aspirin 325 MG tablet Take 325 mg by mouth in the morning and at bedtime.   atorvastatin (LIPITOR) 40 MG tablet Take 40 mg daily at 6 PM by mouth.    busPIRone (BUSPAR) 10 MG tablet Take 10 mg by mouth 3 (three) times daily.   Calcium Citrate-Vitamin D (CALCIUM + D PO) Take 2 tablets by mouth daily.   cyclobenzaprine (FLEXERIL) 5 MG tablet Take 1 tablet (5 mg total)  by mouth in the morning and at bedtime.   DULoxetine (CYMBALTA) 60 MG capsule Take 1 capsule (60 mg total) by mouth daily. Total of 90 mg daily (60 mg + 30 mg)   fluticasone-salmeterol (ADVAIR) 250-50 MCG/ACT AEPB Inhale 1 puff into the lungs in the morning and at bedtime.   ibuprofen (ADVIL) 200 MG tablet Take 200 mg by mouth every 6 (six) hours as needed.   Iron-FA-B Cmp-C-Biot-Probiotic (FUSION PLUS) CAPS Take 1 (one) tablet by mouth twice daily   Misc Natural Products (GLUCOSAMINE CHOND DOUBLE STR PO) Take 2 tablets by mouth daily.   Multiple Vitamin (MULTIVITAMIN) tablet Take 1 tablet by mouth daily. Men's 50+   Multiple Vitamins-Minerals (EQ VISION FORMULA 50+ PO) Take 1 tablet by mouth daily.   polyethylene glycol (MIRALAX / GLYCOLAX) 17 g packet Take 17 g by mouth daily as needed for mild constipation.   tiZANidine (ZANAFLEX) 4 MG tablet Take 4 mg by mouth 3 (three) times daily as needed for muscle spasms.   Facility-Administered Encounter Medications as of 08/22/2021  Medication   bupivacaine-meloxicam ER (ZYNRELEF) injection 400 mg    Allergies (verified) Sulfa antibiotics   History: Past Medical History:  Diagnosis Date   Anxiety    Arthritis    Depression    History of kidney stones  Hyperlipidemia    Inguinal hernia right    Insomnia    Myalgia    PPD positive, treated 1979   1 year of INH   Raynaud phenomenon    Sarcoidosis 1980   Past Surgical History:  Procedure Laterality Date   AXILLARY LYMPH NODE BIOPSY Right 1980   COLONOSCOPY  02/08/2011   Procedure: COLONOSCOPY;  Surgeon: Corbin Ade, MD;  Location: [facility];  Service: Endoscopy;  Laterality: N/A;  1:30 PM   INGUINAL HERNIA REPAIR Right 01/29/2017   Procedure: HERNIA REPAIR INGUINAL ADULT WITH MESH;  Surgeon: Lucretia Roers, MD;  Location: [facility];  Service: General;  Laterality: Right;   KNEE ARTHROSCOPY WITH MEDIAL MENISECTOMY Right 04/05/2016   Procedure: RIGHT KNEE ARTHROSCOPY WITH  PARTIAL  LATERAL MENISECTOMY, DEBRIDEMENT MEDIAL MENISCUS, ACL DEBRIDEMENT, MICRO FRACTURE OF FEMUR;  Surgeon: Vickki Hearing, MD;  Location: [facility];  Service: Orthopedics;  Laterality: Right;   KNEE SURGERY     left   KYPHOPLASTY     TONSILLECTOMY     TOTAL KNEE ARTHROPLASTY Right 02/08/2021   Procedure: TOTAL KNEE ARTHROPLASTY;  Surgeon: Vickki Hearing, MD;  Location: [facility];  Service: Orthopedics;  Laterality: Right;   Family History  Problem Relation Age of Onset   Suicidality Father    Colon cancer Neg Hx    Liver disease Neg Hx    Inflammatory bowel disease Neg Hx    Anesthesia problems Neg Hx    Social History   Socioeconomic History   Marital status: Married    Spouse name: Not on file   Number of children: 1   Years of education: Not on file   Highest education level: Not on file  Occupational History   Occupation: respiratory therapist    Employer: [employer]  Tobacco Use   Smoking status: Former    Packs/day: 0.50    Years: 20.00    Total pack years: 10.00    Types: Pipe, Cigarettes    Quit date: 04/03/1990    Years since quitting: 31.4   Smokeless tobacco: Never   Tobacco comments:    quit many years ago  Vaping Use   Vaping Use: Never used  Substance and Sexual Activity   Alcohol use: Yes    Comment: occassional   Drug use: No   Sexual activity: Yes  Other Topics Concern   Not on file  Social History Narrative   Not on file   Social Determinants of Health   Financial Resource Strain: Not on file  Food Insecurity: Not on file  Transportation Needs: Not on file  Physical Activity: Not on file  Stress: Not on file  Social Connections: Not on file    Tobacco Counseling Counseling given: Not Answered Tobacco comments: quit many years ago   Clinical Intake:                 Diabetic?no         Activities of Daily Living    02/08/2021    3:00 PM 02/04/2021   11:46 AM  In your present state of  health, do you have any difficulty performing the following activities:  Hearing? 0   Vision? 0   Difficulty concentrating or making decisions? 0   Walking or climbing stairs? 0   Dressing or bathing? 0   Doing errands, shopping? 0 0    Patient Care Team: [name], FNP as PCP - General (Family Medicine) [name], [name], MD (  Gastroenterology)  Indicate any recent Medical Services you may have received from other than [facility] providers in the past year (date may be approximate).     Assessment:   This is a routine wellness examination for Albert Gonzalez.  Hearing/Vision screen No results found.  Dietary issues and exercise activities discussed:     Goals Addressed   None    Depression Screen    06/22/2021    3:53 PM  PHQ 2/9 Scores  PHQ - 2 Score 2  PHQ- 9 Score 6    Fall Risk    06/22/2021    3:53 PM 09/26/2018   12:58 PM 09/15/2016    4:19 PM  Fall Risk   Falls in the past year? 0 0 No  Comment  [employer] Telephone Survey: data to providers prior to load [employer] Survey: data to providers prior to load  Number falls in past yr: 0    Injury with Fall? 0    Risk for fall due to : No Fall Risks    Follow up Falls evaluation completed      FALL RISK PREVENTION PERTAINING TO THE HOME:  Any stairs in or around the home? Yes  If so, are there any without handrails? No  Home free of loose throw rugs in walkways, pet beds, electrical cords, etc? Yes  Adequate lighting in your home to reduce risk of falls? Yes   ASSISTIVE DEVICES UTILIZED TO PREVENT FALLS:  Life alert? No  Use of a cane, walker or w/c? No  Grab bars in the bathroom? Yes  Shower chair or bench in shower? Yes  Elevated toilet seat or a handicapped toilet? Yes   TIMED UP AND GO:  Was the test performed? No .  Length of time to ambulate 10 feet:  sec.     Cognitive Function:        Immunizations Immunization History  Administered Date(s) Administered   [employer] Vaccination  05/30/2019, 07/02/2019   Pneumococcal Polysaccharide-23 06/22/2021   Tdap 06/22/2021    TDAP status: Up to date  Flu Vaccine status: Up to date  Pneumococcal vaccine status: Up to date  Covid-19 vaccine status: Information provided on how to obtain vaccines.   Qualifies for Shingles Vaccine? Yes   Zostavax completed No   Shingrix Completed?: Yes  Screening Tests Health Maintenance  Topic Date Due   Zoster Vaccines- Shingrix (1 of 2) Never done   COVID-19 Vaccine (3 - Moderna risk series) 07/30/2019   COLONOSCOPY (Pts 45-16yrs Insurance coverage will need to be confirmed)  02/07/2021   INFLUENZA VACCINE  09/27/2021   Pneumonia Vaccine 30+ Years old (2 - PCV) 06/23/2022   TETANUS/TDAP  06/23/2031   Hepatitis C Screening  Completed   HPV VACCINES  Aged Out    Health Maintenance  Health Maintenance Due  Topic Date Due   Zoster Vaccines- Shingrix (1 of 2) Never done   COVID-19 Vaccine (3 - Moderna risk series) 07/30/2019   COLONOSCOPY (Pts 45-87yrs Insurance coverage will need to be confirmed)  02/07/2021    Colorectal cancer screening: Referral to GI placed  . Pt aware the office will call re: appt.  Lung Cancer Screening: (Low Dose CT Chest recommended if Age 62-80 years, 30 pack-year currently smoking OR have quit w/in 15years.) does not qualify.   Lung Cancer Screening Referral:   Additional Screening:  Hepatitis C Screening: does not qualify; Completed 06/23/2021  Vision Screening: Recommended annual ophthalmology exams for early detection of glaucoma and other disorders  of the eye. Is the patient up to date with their annual eye exam?  No  Who is the provider or what is the name of the office in which the patient attends annual eye exams? My eye doctor  If pt is not established with a provider, would they like to be referred to a provider to establish care? No .   Dental Screening: Recommended annual dental exams for proper oral hygiene  Community Resource  Referral / Chronic Care Management: CRR required this visit?  No   CCM required this visit?  No      Plan:     I have personally reviewed and noted the following in the patient's chart:   Medical and social history Use of alcohol, tobacco or illicit drugs  Current medications and supplements including opioid prescriptions. Patient is not currently taking opioid prescriptions. Functional ability and status Nutritional status Physical activity Advanced directives List of other physicians Hospitalizations, surgeries, and ER visits in previous 12 months Vitals Screenings to include cognitive, depression, and falls Referrals and appointments  In addition, I have reviewed and discussed with patient certain preventive protocols, quality metrics, and best practice recommendations. A written personalized care plan for preventive services as well as general preventive health recommendations were provided to patient.     Harriet Pho, CMA   08/22/2021   Nurse Notes:

## 2021-08-26 ENCOUNTER — Encounter: Payer: Self-pay | Admitting: *Deleted

## 2021-08-26 MED ORDER — PEG 3350-KCL-NA BICARB-NACL 420 G PO SOLR: ORAL | 0 refills | Status: DC

## 2021-08-26 NOTE — Progress Notes (Signed)
PA approved. DOS: 09/28/2021 - 12/27/2021. Auth # [id number]

## 2021-08-26 NOTE — Progress Notes (Signed)
Spoke with pt. Scheduled 8/2. Aware will mail instructions/pre-op appt. Will send rx to pharmacy

## 2021-09-05 ENCOUNTER — Encounter: Payer: Self-pay | Admitting: Pulmonary Disease

## 2021-09-05 ENCOUNTER — Ambulatory Visit: Payer: Medicare HMO | Admitting: Pulmonary Disease

## 2021-09-05 VITALS — BP 124/72 | HR 74 | Temp 97.6°F | Ht 71.0 in | Wt 165.8 lb

## 2021-09-05 DIAGNOSIS — J4540 Moderate persistent asthma, uncomplicated: Secondary | ICD-10-CM | POA: Diagnosis not present

## 2021-09-05 DIAGNOSIS — R0683 Snoring: Secondary | ICD-10-CM

## 2021-09-05 DIAGNOSIS — D869 Sarcoidosis, unspecified: Secondary | ICD-10-CM

## 2021-09-05 NOTE — Progress Notes (Signed)
[facility]  Chief Complaint  Patient presents with   Follow-up    Wheezing and breathing has improved.     Past Surgical History:  He  has a past surgical history that includes Tonsillectomy; Knee surgery; Colonoscopy (02/08/2011); Kyphoplasty; Knee arthroscopy with medial menisectomy (Right, 04/05/2016); Inguinal hernia repair (Right, 01/29/2017); Axillary lymph node biopsy (Right, 1980); and Total knee arthroplasty (Right, 02/08/2021).  Past Medical History:  Anxiety, OA, Depression, Nephrolithiasis, HLD, Insomnia  Constitutional:  BP 124/72 (BP Location: Right Arm, Patient Position: Sitting)   Pulse 74   Temp 97.6 F (36.4 C) (Temporal)   Ht '5\' 11"'$  (1.803 m)   Wt 165 lb 12.8 oz (75.2 kg)   SpO2 98% Comment: ra  BMI 23.12 kg/m   Brief Summary:  Albert Gonzalez is a 72 y.o. male former smoker with sarcoidosis with pulmonary fibrosis, and asthma.  He had PPD positive in 1979 treated with INH for 1 year, and Rt axillary LN biopsy in 1980 that showed sarcoidosis.      Subjective:   His PFT showed mild restriction and moderate diffusion defect.  CT chest showed fibrotic changes of sarcoid, but could have UIP overlap.  He has been using advair and feels much better.  Uses albuterol few times per week, especially when he works outside.  Not having cough, wheeze, or chest congestion.  No leg cramps or swelling.  Sinuses okay.  Sleeping okay.  Sleep study didn't show sleep apnea.  Physical Exam:   Appearance - well kempt   ENMT - no sinus tenderness, no oral exudate, no LAN, Mallampati 3 airway, no stridor  Respiratory - equal breath sounds bilaterally, no wheezing or rales  CV - s1s2 regular rate and rhythm, no murmurs  Ext - no clubbing, no edema  Skin - no rashes  Psych - normal mood and affect    Pulmonary testing:  PFT 07/04/21 >> FEV1 3.18 (96%), FEV1% 90, TLC 4.62 (63%), DLCO 59%  Chest Imaging:  HRCT chest 08/15/21  >> moderate fibrosis with apical basal gradient, irregular peripheral interstitial opacity, septal thickening and nodularity, subpleural BTX and small areas of honeycombing at bases (sarcoidosis versus sarcoidosis +UIP)  Sleep Tests:  PSG 08/10/21 >> AHI 1.8, SpO2 low 90%  Social History:  He  reports that he quit smoking about 31 years ago. His smoking use included pipe and cigarettes. He has a 10.00 pack-year smoking history. He has never used smokeless tobacco. He reports that he does not currently use alcohol. He reports that he does not use drugs.  Family History:  His family history includes Suicidality in his father.     Assessment/Plan:   Allergic asthma. - continue advair 250 one puff bid - prn albuterol  Sarcoidosis. - has fibrotic changes which could have UIP overlap - symptoms improved with inhaler therapy - plan to repeat PFT and CT to monitor >> timing of this will depend on his symptoms  Snoring. - sleep study did not show evidence for sleep apnea   Time Spent Involved in Patient Care on Day of Examination:  35 minutes  Follow up:   Patient Instructions  Follow up in 6 months  Medication List:   Allergies as of 09/05/2021       Reactions   Sulfa Antibiotics Rash   Childhood         Medication List        Accurate as of September 05, 2021 10:55 AM. If you  have any questions, ask your nurse or doctor.          STOP taking these medications    methylPREDNISolone 4 MG Tbpk tablet Commonly known as: MEDROL DOSEPAK Stopped by: Chesley Mires, MD       TAKE these medications    albuterol 108 (90 Base) MCG/ACT inhaler Commonly known as: VENTOLIN HFA 2 puffs every 4 (four) hours as needed for wheezing or shortness of breath.   ALPRAZolam 0.5 MG tablet Commonly known as: XANAX Take 1 tablet (0.5 mg total) by mouth daily as needed for anxiety.   aspirin 325 MG tablet Take 325 mg by mouth in the morning and at bedtime.   atorvastatin 40 MG  tablet Commonly known as: LIPITOR Take 40 mg daily at 6 PM by mouth.   busPIRone 10 MG tablet Commonly known as: BUSPAR Take 10 mg by mouth 3 (three) times daily.   CALCIUM + D PO Take 2 tablets by mouth daily.   cyclobenzaprine 5 MG tablet Commonly known as: FLEXERIL Take 1 tablet (5 mg total) by mouth in the morning and at bedtime.   diclofenac 75 MG EC tablet Commonly known as: VOLTAREN Take 75 mg by mouth 2 (two) times daily as needed.   DULoxetine 60 MG capsule Commonly known as: CYMBALTA Take 1 capsule (60 mg total) by mouth daily. Total of 90 mg daily (60 mg + 30 mg)   EQ VISION FORMULA 50+ PO Take 1 tablet by mouth daily.   fluticasone-salmeterol 250-50 MCG/ACT Aepb Commonly known as: ADVAIR Inhale 1 puff into the lungs in the morning and at bedtime.   Fusion Plus Caps Take 1 (one) tablet by mouth twice daily   GLUCOSAMINE CHOND DOUBLE STR PO Take 2 tablets by mouth daily.   ibuprofen 200 MG tablet Commonly known as: ADVIL Take 200 mg by mouth every 6 (six) hours as needed.   multivitamin tablet Take 1 tablet by mouth daily. Men's 50+   polyethylene glycol 17 g packet Commonly known as: MIRALAX / GLYCOLAX Take 17 g by mouth daily as needed for mild constipation.   polyethylene glycol-electrolytes 420 g solution Commonly known as: NuLYTELY As directed   tiZANidine 4 MG tablet Commonly known as: ZANAFLEX Take 4 mg by mouth 3 (three) times daily as needed for muscle spasms.        Signature:  Chesley Mires, MD [place] Pager - [phone] 09/05/2021, 10:55 AM

## 2021-09-05 NOTE — Patient Instructions (Signed)
Follow up in 6 months 

## 2021-09-14 ENCOUNTER — Encounter (HOSPITAL_COMMUNITY): Payer: Self-pay | Admitting: Physical Therapy

## 2021-09-14 ENCOUNTER — Ambulatory Visit (HOSPITAL_COMMUNITY): Payer: Medicare HMO | Attending: Neurosurgery | Admitting: Physical Therapy

## 2021-09-14 DIAGNOSIS — M6281 Muscle weakness (generalized): Secondary | ICD-10-CM | POA: Diagnosis present

## 2021-09-14 DIAGNOSIS — R2689 Other abnormalities of gait and mobility: Secondary | ICD-10-CM | POA: Diagnosis present

## 2021-09-14 DIAGNOSIS — M5459 Other low back pain: Secondary | ICD-10-CM | POA: Insufficient documentation

## 2021-09-14 DIAGNOSIS — R29898 Other symptoms and signs involving the musculoskeletal system: Secondary | ICD-10-CM | POA: Diagnosis present

## 2021-09-14 NOTE — Therapy (Signed)
OUTPATIENT PHYSICAL THERAPY THORACOLUMBAR EVALUATION   Patient Name: Albert Gonzalez MRN: [id number] DOB:07/21/1950, 71 y.o., male Today's Date: 09/14/2021   PT End of Session - 09/14/21 1413     Visit Number 1    Number of Visits 12    Date for PT Re-Evaluation 10/26/21    Authorization Type [employer]    Authorization Time Period 12 visits requested - check auth    Progress Note Due on Visit 10    PT Start Time 1413    PT Stop Time 1503    PT Time Calculation (min) 50 min    Activity Tolerance Patient tolerated treatment well    Behavior During Therapy WFL for tasks assessed/performed             Past Medical History:  Diagnosis Date   Anxiety    Arthritis    Depression    History of kidney stones    Hyperlipidemia    Inguinal hernia right    Insomnia    Myalgia    PPD positive, treated 1979   1 year of INH   Raynaud phenomenon    Sarcoidosis 1980   Past Surgical History:  Procedure Laterality Date   AXILLARY LYMPH NODE BIOPSY Right 1980   COLONOSCOPY  02/08/2011   Procedure: COLONOSCOPY;  Surgeon: Daneil Dolin, MD;  [facility];  Service: Endoscopy;  Laterality: N/A;  1:30 PM   INGUINAL HERNIA REPAIR Right 01/29/2017   Procedure: HERNIA REPAIR INGUINAL ADULT WITH MESH;  Surgeon: Virl Cagey, MD;  Location: [facility];  Service: General;  Laterality: Right;   KNEE ARTHROSCOPY WITH MEDIAL MENISECTOMY Right 04/05/2016   Procedure: RIGHT KNEE ARTHROSCOPY WITH PARTIAL  LATERAL MENISECTOMY, DEBRIDEMENT MEDIAL MENISCUS, ACL DEBRIDEMENT, MICRO FRACTURE OF FEMUR;  Surgeon: Carole Civil, MD;  Location: [facility];  Service: Orthopedics;  Laterality: Right;   KNEE SURGERY     left   KYPHOPLASTY     TONSILLECTOMY     TOTAL KNEE ARTHROPLASTY Right 02/08/2021   Procedure: TOTAL KNEE ARTHROPLASTY;  Surgeon: Carole Civil, MD;  Location: [facility];  Service: Orthopedics;  Laterality: Right;   Patient Active Problem List   Diagnosis Date Noted    Snoring 07/04/2021   Asthma 07/04/2021   Bilateral low back pain 06/23/2021   S/P total knee replacement, right 02/08/21 02/22/2021   Osteoarthritis of right knee 02/08/2021   Sepsis due to pneumonia ([place]) 09/09/2020   Depression    Anxiety    Hyperlipidemia    Arthritis    Sarcoidosis    Right inguinal hernia    MDD (major depressive disorder), recurrent episode, moderate ([place]) 08/24/2016   Acute medial meniscus tear of left knee    Tear of lateral meniscus of right knee, current    Primary osteoarthritis of right knee    Deficiency of anterior cruciate ligament of right knee    Abnormal CT scan, colon 02/01/2011   Constipation 02/01/2011    PCP: [name] FNP  REFERRING PROVIDER: Newman Pies, MD   REFERRING DIAG: M54.50 chronic midline LBP w/o sciatica   Rationale for Evaluation and Treatment Rehabilitation  THERAPY DIAG:  Other low back pain  Muscle weakness (generalized)  Other abnormalities of gait and mobility  Other symptoms and signs involving the musculoskeletal system  ONSET DATE: Feb 2023  SUBJECTIVE:  SUBJECTIVE STATEMENT: States knee is doing good. Fell in February and hurt his low back. Has been doing simple exercises at home that bother his back. Still having some knee swelling. Has been taking ibuprofen and Tylenol that help for a little bit. He isnt able to do his yard work. Pain with bending, lifting, digging. Once it starts hurting he is limited the rest of the day. It has not been getting better. Some knee pain still with working and walking.  PERTINENT HISTORY:  COPD, hx knee surgeries and TKA  PAIN:  Are you having pain? Yes: NPRS scale: 1-2/10 Pain location: back Pain description: dull Aggravating factors: bending, lifting, digging Relieving factors:  meds, rest   PRECAUTIONS: None  WEIGHT BEARING RESTRICTIONS No  FALLS:  Has patient fallen in last 6 months? Yes. Number of falls 1  LIVING ENVIRONMENT: Lives with: lives with their spouse Lives in: House/apartment Stairs: Yes: External: 1 steps; none Has following equipment at home: Single point cane, Environmental consultant - 2 wheeled, Wheelchair (manual), and Grab bars  OCCUPATION: Retired  PLOF: [place] find out what needs to be done with his back   OBJECTIVE:   DIAGNOSTIC FINDINGS:  XR 06/23/21 IMPRESSION: 1. Mild superior endplate fracture at L4 of uncertain acuity but is new since 2016. 2. Treated compression deformity at L1.    PATIENT SURVEYS:  FOTO 55% function  SCREENING FOR RED FLAGS: Bowel or bladder incontinence: No Spinal tumors: No Cauda equina syndrome: No Compression fracture: No Abdominal aneurysm: No  COGNITION:  Overall cognitive status: Within functional limits for tasks assessed     SENSATION: WFL   POSTURE: rounded shoulders and forward head  PALPATION: Slight TTP upper lumbar paraspinals, grossly hypomobile thoracic and lumbar spine  LUMBAR ROM:   Active  A/PROM  eval  Flexion 0% limited  Extension 25% limited  Right lateral flexion 25%limited  Left lateral flexion 25% limited  Right rotation 0% limited  Left rotation 0% limited   (Blank rows = not tested)  LOWER EXTREMITY ROM:    WFL for tasks assessed  Active  Right eval Left eval  Hip flexion    Hip extension    Hip abduction    Hip adduction    Hip internal rotation    Hip external rotation    Knee flexion    Knee extension    Ankle dorsiflexion    Ankle plantarflexion    Ankle inversion    Ankle eversion     (Blank rows = not tested)  LOWER EXTREMITY MMT:    MMT Right eval Left eval  Hip flexion 5 5  Hip extension 4- 4-  Hip abduction 4+ 4+  Hip adduction    Hip internal rotation    Hip external rotation    Knee flexion 5 5  Knee extension  5 5  Ankle dorsiflexion 5 5  Ankle plantarflexion    Ankle inversion    Ankle eversion     (Blank rows = not tested)    FUNCTIONAL TESTS:  5 times sit to stand: 11.5 seconds with hands on thighs 2 minute walk test: 350 feet   GAIT: Distance walked: 350 feet Assistive device utilized: None Level of assistance: Complete Independence Comments: 2MWT, antalgic on RLE with decreased hip extension, slight trendelenburg on RLE    TODAY'S TREATMENT  09/14/21 Bridge 2x 10  Sidelying clam 2x 10  STS 2x 10    PATIENT EDUCATION:  Education details: Patient educated on exam findings, POC,  scope of PT, HEP, and gradual increase in exercise. Person educated: Patient Education method: Explanation, Demonstration, and Handouts Education comprehension: verbalized understanding, returned demonstration, verbal cues required, and tactile cues required   HOME EXERCISE PROGRAM: 09/14/21 Access Code: [email or web address] - Supine Bridge  - 3 x daily - 7 x weekly - 2 sets - 10 reps - Clamshell  - 3 x daily - 7 x weekly - 2 sets - 10 reps - Sit to Stand with Arms Crossed  - 3 x daily - 7 x weekly - 2 sets - 10 reps  ASSESSMENT:  CLINICAL IMPRESSION: Patient a 71 y.o. y.o. male who was seen today for physical therapy evaluation and treatment for LBP. Patient presents with pain limited deficits in lumbar spine strength, ROM, endurance, activity tolerance, and functional mobility with ADL. Patient only experiences pain in upper lumbar paraspinals following 2MWT. Patient is having to modify and restrict ADL as indicated by outcome measure score as well as subjective information and objective measures which is affecting overall participation. Patient will benefit from skilled physical therapy in order to improve function and reduce impairment.    OBJECTIVE IMPAIRMENTS decreased activity tolerance, decreased endurance, decreased mobility, decreased ROM, decreased strength, increased muscle spasms, impaired  flexibility, improper body mechanics, postural dysfunction, and pain.   ACTIVITY LIMITATIONS lifting, bending, standing, squatting, stairs, transfers, locomotion level, and caring for others  PARTICIPATION LIMITATIONS: meal prep, cleaning, laundry, interpersonal relationship, shopping, community activity, and yard work  PERSONAL FACTORS Time since onset of injury/illness/exacerbation and 1 comorbidity: recent TKA  are also affecting patient's functional outcome.   REHAB POTENTIAL: Good  CLINICAL DECISION MAKING: Stable/uncomplicated  EVALUATION COMPLEXITY: Low   GOALS: Goals reviewed with patient? Yes  SHORT TERM GOALS: Target date: 10/05/2021  Patient will be independent with HEP in order to improve functional outcomes. Baseline:  Goal status: INITIAL  2.  Patient will report at least 25% improvement in symptoms for improved quality of life. Baseline:  Goal status: INITIAL   LONG TERM GOALS: Target date: 10/26/2021  Patient will report at least 75% improvement in symptoms for improved quality of life. Baseline:  Goal status: INITIAL  2.  Patient will improve FOTO score by at least 8 points in order to indicate improved tolerance to activity. Baseline: 09/14/21 55% function Goal status: INITIAL  3.  Patient will demonstrate at least 25% improvement in lumbar ROM in all restricted planes for improved ability to move trunk while completing chores. Baseline: see AROM Goal status: INITIAL  4.  Patient will be able to ambulate at least 450 feet in 2MWT in order to demonstrate improved tolerance to activity. Baseline: 350 feet Goal status: INITIAL  5.  Patient will demonstrate grade of 5/5 MMT grade in all tested musculature as evidence of improved strength to assist with stair ambulation and gait.   Baseline: see MMT Goal status: INITIAL    PLAN: PT FREQUENCY: 2x/week  PT DURATION: 6 weeks  PLANNED INTERVENTIONS: Therapeutic exercises, Therapeutic activity,  Neuromuscular re-education, Balance training, Gait training, Patient/Family education, Joint manipulation, Joint mobilization, Stair training, Orthotic/Fit training, DME instructions, Aquatic Therapy, Dry Needling, Electrical stimulation, Spinal manipulation, Spinal mobilization, Cryotherapy, Moist heat, Compression bandaging, scar mobilization, Splintting, Taping, Traction, Ultrasound, Ionotophoresis '4mg'$ /ml Dexamethasone, and Manual therapy  PLAN FOR NEXT SESSION: f/u with HEP, glute strength, lumbar mobility; functional strength, progress as able   Mearl Latin, PT 09/14/2021, 3:14 PM

## 2021-09-19 ENCOUNTER — Ambulatory Visit (HOSPITAL_COMMUNITY): Payer: Medicare HMO

## 2021-09-19 DIAGNOSIS — M5459 Other low back pain: Secondary | ICD-10-CM

## 2021-09-19 DIAGNOSIS — R2689 Other abnormalities of gait and mobility: Secondary | ICD-10-CM

## 2021-09-19 DIAGNOSIS — M6281 Muscle weakness (generalized): Secondary | ICD-10-CM

## 2021-09-19 DIAGNOSIS — R29898 Other symptoms and signs involving the musculoskeletal system: Secondary | ICD-10-CM

## 2021-09-19 NOTE — Therapy (Signed)
OUTPATIENT PHYSICAL THERAPY THORACOLUMBAR TREATMENT   Patient Name: Albert Gonzalez MRN: [id number] DOB:15-Mar-1950, 71 y.o., male Today's Date: 09/19/2021   PT End of Session - 09/19/21 1516     Visit Number 2    Number of Visits 12    Date for PT Re-Evaluation 10/26/21    Authorization Type [employer]    Authorization Time Period 12 visits requested - check auth    Progress Note Due on Visit 10    PT Start Time 1430    PT Stop Time 1514    PT Time Calculation (min) 44 min    Activity Tolerance Patient tolerated treatment well    Behavior During Therapy WFL for tasks assessed/performed              Past Medical History:  Diagnosis Date   Anxiety    Arthritis    Depression    History of kidney stones    Hyperlipidemia    Inguinal hernia right    Insomnia    Myalgia    PPD positive, treated 1979   1 year of INH   Raynaud phenomenon    Sarcoidosis 1980   Past Surgical History:  Procedure Laterality Date   AXILLARY LYMPH NODE BIOPSY Right 1980   COLONOSCOPY  02/08/2011   Procedure: COLONOSCOPY;  Surgeon: Daneil Dolin, MD;  [facility];  Service: Endoscopy;  Laterality: N/A;  1:30 PM   INGUINAL HERNIA REPAIR Right 01/29/2017   Procedure: HERNIA REPAIR INGUINAL ADULT WITH MESH;  Surgeon: Virl Cagey, MD;  Location: [facility];  Service: General;  Laterality: Right;   KNEE ARTHROSCOPY WITH MEDIAL MENISECTOMY Right 04/05/2016   Procedure: RIGHT KNEE ARTHROSCOPY WITH PARTIAL  LATERAL MENISECTOMY, DEBRIDEMENT MEDIAL MENISCUS, ACL DEBRIDEMENT, MICRO FRACTURE OF FEMUR;  Surgeon: Carole Civil, MD;  Location: [facility];  Service: Orthopedics;  Laterality: Right;   KNEE SURGERY     left   KYPHOPLASTY     TONSILLECTOMY     TOTAL KNEE ARTHROPLASTY Right 02/08/2021   Procedure: TOTAL KNEE ARTHROPLASTY;  Surgeon: Carole Civil, MD;  Location: [facility];  Service: Orthopedics;  Laterality: Right;   Patient Active Problem List   Diagnosis Date Noted    Snoring 07/04/2021   Asthma 07/04/2021   Bilateral low back pain 06/23/2021   S/P total knee replacement, right 02/08/21 02/22/2021   Osteoarthritis of right knee 02/08/2021   Sepsis due to pneumonia ([place]) 09/09/2020   Depression    Anxiety    Hyperlipidemia    Arthritis    Sarcoidosis    Right inguinal hernia    MDD (major depressive disorder), recurrent episode, moderate ([place]) 08/24/2016   Acute medial meniscus tear of left knee    Tear of lateral meniscus of right knee, current    Primary osteoarthritis of right knee    Deficiency of anterior cruciate ligament of right knee    Abnormal CT scan, colon 02/01/2011   Constipation 02/01/2011    PCP: [name] FNP  REFERRING PROVIDER: Newman Pies, MD   REFERRING DIAG: M54.50 chronic midline LBP w/o sciatica   Rationale for Evaluation and Treatment Rehabilitation  THERAPY DIAG:  Other low back pain  Muscle weakness (generalized)  Other abnormalities of gait and mobility  Other symptoms and signs involving the musculoskeletal system  ONSET DATE: Feb 2023  SUBJECTIVE:  SUBJECTIVE STATEMENT: No new issues since eval. Reports biggest complaint is back pain with lifting. PERTINENT HISTORY:  COPD, hx knee surgeries and TKA  PAIN:  Are you having pain? Yes: NPRS scale: 1-2/10 Pain location: back Pain description: dull Aggravating factors: bending, lifting, digging Relieving factors: meds, rest   PRECAUTIONS: None  WEIGHT BEARING RESTRICTIONS No  FALLS:  Has patient fallen in last 6 months? Yes. Number of falls 1  LIVING ENVIRONMENT: Lives with: lives with their spouse Lives in: House/apartment Stairs: Yes: External: 1 steps; none Has following equipment at home: Single point cane, Environmental consultant - 2 wheeled, Wheelchair  (manual), and Grab bars  OCCUPATION: Retired  PLOF: [place] find out what needs to be done with his back   OBJECTIVE:   DIAGNOSTIC FINDINGS:  XR 06/23/21 IMPRESSION: 1. Mild superior endplate fracture at L4 of uncertain acuity but is new since 2016. 2. Treated compression deformity at L1.    PATIENT SURVEYS:  FOTO 55% function  SCREENING FOR RED FLAGS: Bowel or bladder incontinence: No Spinal tumors: No Cauda equina syndrome: No Compression fracture: No Abdominal aneurysm: No  COGNITION:  Overall cognitive status: Within functional limits for tasks assessed     SENSATION: WFL   POSTURE: rounded shoulders and forward head  PALPATION: Slight TTP upper lumbar paraspinals, grossly hypomobile thoracic and lumbar spine  LUMBAR ROM:   Active  A/PROM  eval  Flexion 0% limited  Extension 25% limited  Right lateral flexion 25%limited  Left lateral flexion 25% limited  Right rotation 0% limited  Left rotation 0% limited   (Blank rows = not tested)  LOWER EXTREMITY ROM:    WFL for tasks assessed  Active  Right eval Left eval  Hip flexion    Hip extension    Hip abduction    Hip adduction    Hip internal rotation    Hip external rotation    Knee flexion    Knee extension    Ankle dorsiflexion    Ankle plantarflexion    Ankle inversion    Ankle eversion     (Blank rows = not tested)  LOWER EXTREMITY MMT:    MMT Right eval Left eval  Hip flexion 5 5  Hip extension 4- 4-  Hip abduction 4+ 4+  Hip adduction    Hip internal rotation    Hip external rotation    Knee flexion 5 5  Knee extension 5 5  Ankle dorsiflexion 5 5  Ankle plantarflexion    Ankle inversion    Ankle eversion     (Blank rows = not tested)    FUNCTIONAL TESTS:  5 times sit to stand: 11.5 seconds with hands on thighs 2 minute walk test: 350 feet   GAIT: Distance walked: 350 feet Assistive device utilized: None Level of assistance: Complete  Independence Comments: 2MWT, antalgic on RLE with decreased hip extension, slight trendelenburg on RLE    TODAY'S TREATMENT  09/19/21 Review of HEP and goals  Supine: Bridge 2 x 10 Bridge with ball squeeze for hip abduction x 10 Bridge with belt for hip Abduction x 10  Side lying: Clam 2 x 10 each side  Sit to stand 2 x 10 arms crossed  Standing: RTB shoulder extension and rows 2 x 10 Wall angels x 10 Wall push ups 2 x 10      09/14/21 Bridge 2x 10  Sidelying clam 2x 10  STS 2x 10    PATIENT EDUCATION:  Education details: Patient educated  on exam findings, POC, scope of PT, HEP, and gradual increase in exercise. Person educated: Patient Education method: Explanation, Demonstration, and Handouts Education comprehension: verbalized understanding, returned demonstration, verbal cues required, and tactile cues required   HOME EXERCISE PROGRAM: Access Code: [email or web address] URL: [email or web address] Date: 09/19/2021 Prepared by: [facility]  Exercises - Supine Bridge  - 3 x daily - 7 x weekly - 2 sets - 10 reps - Clamshell  - 3 x daily - 7 x weekly - 2 sets - 10 reps - Sit to Stand with Arms Crossed  - 3 x daily - 7 x weekly - 2 sets - 10 reps - Supine Bridge with Mini Swiss Ball Between Knees  - 3 x daily - 7 x weekly - 1 sets - 10 reps - Bridge with Hip Abduction and Resistance  - 3 x daily - 7 x weekly - 1 sets - 10 reps  ASSESSMENT:  CLINICAL IMPRESSION: Today's session started with review of HEP and goals.  Patient verbalizes understanding and agreement with set rehab goals.  Progressed hip and core strengthening exercises. Updated HEP. Verbally reminded and cued patient for improved sitting posture as she tends to sit with fairly flat lumbar spine; forward head and rounded shoulders. Patient will benefit from skilled physical therapy in order to improve function and reduce impairment.    OBJECTIVE IMPAIRMENTS decreased activity tolerance, decreased  endurance, decreased mobility, decreased ROM, decreased strength, increased muscle spasms, impaired flexibility, improper body mechanics, postural dysfunction, and pain.   ACTIVITY LIMITATIONS lifting, bending, standing, squatting, stairs, transfers, locomotion level, and caring for others  PARTICIPATION LIMITATIONS: meal prep, cleaning, laundry, interpersonal relationship, shopping, community activity, and yard work  PERSONAL FACTORS Time since onset of injury/illness/exacerbation and 1 comorbidity: recent TKA  are also affecting patient's functional outcome.   REHAB POTENTIAL: Good  CLINICAL DECISION MAKING: Stable/uncomplicated  EVALUATION COMPLEXITY: Low   GOALS: Goals reviewed with patient? Yes  SHORT TERM GOALS: Target date: 10/05/2021  Patient will be independent with HEP in order to improve functional outcomes. Baseline:  Goal status: ongoing  2.  Patient will report at least 25% improvement in symptoms for improved quality of life. Baseline:  Goal status: ongoing   LONG TERM GOALS: Target date: 10/26/2021  Patient will report at least 75% improvement in symptoms for improved quality of life. Baseline:  Goal status: ongoing  2.  Patient will improve FOTO score by at least 8 points in order to indicate improved tolerance to activity. Baseline: 09/14/21 55% function Goal status: ongoing  3.  Patient will demonstrate at least 25% improvement in lumbar ROM in all restricted planes for improved ability to move trunk while completing chores. Baseline: see AROM Goal status: ongoing  4.  Patient will be able to ambulate at least 450 feet in 2MWT in order to demonstrate improved tolerance to activity. Baseline: 350 feet Goal status: ongoing  5.  Patient will demonstrate grade of 5/5 MMT grade in all tested musculature as evidence of improved strength to assist with stair ambulation and gait.   Baseline: see MMT Goal status: ongoing    PLAN: PT FREQUENCY: 2x/week  PT  DURATION: 6 weeks  PLANNED INTERVENTIONS: Therapeutic exercises, Therapeutic activity, Neuromuscular re-education, Balance training, Gait training, Patient/Family education, Joint manipulation, Joint mobilization, Stair training, Orthotic/Fit training, DME instructions, Aquatic Therapy, Dry Needling, Electrical stimulation, Spinal manipulation, Spinal mobilization, Cryotherapy, Moist heat, Compression bandaging, scar mobilization, Splintting, Taping, Traction, Ultrasound, Ionotophoresis '4mg'$ /ml Dexamethasone, and Manual therapy  PLAN FOR NEXT SESSION:  glute strength, lumbar mobility; functional strength, progress as able; try decompression exercises   3:18 PM, 09/19/21 Malike Foglio [place] MPT [facility] [place] [phone] [phone]

## 2021-09-21 ENCOUNTER — Ambulatory Visit (INDEPENDENT_AMBULATORY_CARE_PROVIDER_SITE_OTHER): Payer: Medicare HMO | Admitting: Family Medicine

## 2021-09-21 ENCOUNTER — Encounter: Payer: Self-pay | Admitting: Family Medicine

## 2021-09-21 VITALS — BP 122/72 | HR 75 | Ht 71.0 in | Wt 167.6 lb

## 2021-09-21 DIAGNOSIS — D869 Sarcoidosis, unspecified: Secondary | ICD-10-CM

## 2021-09-21 DIAGNOSIS — E559 Vitamin D deficiency, unspecified: Secondary | ICD-10-CM

## 2021-09-21 DIAGNOSIS — F320 Major depressive disorder, single episode, mild: Secondary | ICD-10-CM

## 2021-09-21 DIAGNOSIS — M545 Low back pain, unspecified: Secondary | ICD-10-CM

## 2021-09-21 DIAGNOSIS — E7849 Other hyperlipidemia: Secondary | ICD-10-CM | POA: Diagnosis not present

## 2021-09-21 DIAGNOSIS — I714 Abdominal aortic aneurysm, without rupture, unspecified: Secondary | ICD-10-CM | POA: Diagnosis not present

## 2021-09-21 DIAGNOSIS — R7301 Impaired fasting glucose: Secondary | ICD-10-CM | POA: Diagnosis not present

## 2021-09-21 NOTE — Patient Instructions (Signed)
I appreciate the opportunity to provide care to you today!    Follow up:  4 months CPE  Labs: please stop by the lab during the week to get your blood drawn (CBC, CMP, TSH, Lipid profile, HgA1c, Vit D)   Please stop by [facility] to get an U/S of your abdomen   Please continue to a heart-healthy diet and increase your physical activities. Try to exercise for 6mns at least three times a week.      It was a pleasure to see you and I look forward to continuing to work together on your health and well-being. Please do not hesitate to call the office if you need care or have questions about your care.   Have a wonderful day and week. With Gratitude, GAlvira MondayMSN, FNP-BC

## 2021-09-21 NOTE — Assessment & Plan Note (Signed)
He mentioned an incidental finding of  a 4 cm AAA a year ago No N/V or abdominal pain noted Will get an U/S of the abdomen to reevaluate the size of the aneurysm

## 2021-09-21 NOTE — Assessment & Plan Note (Signed)
Reports improvement in his mood He shared that his appt with the therapist was canceled twice, and he never spoke with the therapist Nonetheless, he feels great and is noted to be in a better space

## 2021-09-21 NOTE — Assessment & Plan Note (Signed)
Reports significant improvement with physical therapy  No complaints or concerns today

## 2021-09-21 NOTE — Assessment & Plan Note (Signed)
He followed up with the pulmonologist on 09/05/21 He is compliant with his Advair and uses his rescue inhaler as needed

## 2021-09-21 NOTE — Progress Notes (Signed)
Established Patient Office Visit  Subjective:  Patient ID: Albert Gonzalez, male    DOB: 10/27/1950  Age: 71 y.o. MRN: [id number]  CC:  Chief Complaint  Patient presents with   Follow-up    3 month follow up    HPI Albert Gonzalez is a 71 y.o. male with past medical history of low back pain, sarcoidosis presents for f/u of  chronic medical conditions. Low back pain: Reports significant improvement with physical therapy. No complaints or concerns today.  Fatigue: Notes improvement in his symptoms. He notes to be taking OTC iron supplements. Depression: Reports improvement in his mood. He shared that his appt with the therapist was canceled twice, and he never spoke with the therapist. Nonetheless, he feels great and is noted to be in a better space.   Sarcoidosis: he followed up with the pulmonologist on 09/05/21. He is compliant with his Advair and uses his rescue inhaler as needed.  AAA: He mentioned an incidental finding of  a 4 cm AAA a year ago. No N/V or abdominal pain noted  Past Medical History:  Diagnosis Date   Anxiety    Arthritis    Depression    History of kidney stones    Hyperlipidemia    Inguinal hernia right    Insomnia    Myalgia    PPD positive, treated 1979   1 year of INH   Raynaud phenomenon    Sarcoidosis 1980    Past Surgical History:  Procedure Laterality Date   AXILLARY LYMPH NODE BIOPSY Right 1980   COLONOSCOPY  02/08/2011   Procedure: COLONOSCOPY;  Surgeon: Daneil Dolin, MD;  Location: [facility];  Service: Endoscopy;  Laterality: N/A;  1:30 PM   INGUINAL HERNIA REPAIR Right 01/29/2017   Procedure: HERNIA REPAIR INGUINAL ADULT WITH MESH;  Surgeon: Virl Cagey, MD;  Location: [facility];  Service: General;  Laterality: Right;   KNEE ARTHROSCOPY WITH MEDIAL MENISECTOMY Right 04/05/2016   Procedure: RIGHT KNEE ARTHROSCOPY WITH PARTIAL  LATERAL MENISECTOMY, DEBRIDEMENT MEDIAL MENISCUS, ACL DEBRIDEMENT, MICRO FRACTURE OF FEMUR;  Surgeon:  Carole Civil, MD;  Location: [facility];  Service: Orthopedics;  Laterality: Right;   KNEE SURGERY     left   KYPHOPLASTY     TONSILLECTOMY     TOTAL KNEE ARTHROPLASTY Right 02/08/2021   Procedure: TOTAL KNEE ARTHROPLASTY;  Surgeon: Carole Civil, MD;  Location: [facility];  Service: Orthopedics;  Laterality: Right;    Family History  Problem Relation Age of Onset   Suicidality Father    Colon cancer Neg Hx    Liver disease Neg Hx    Inflammatory bowel disease Neg Hx    Anesthesia problems Neg Hx     Social History   Socioeconomic History   Marital status: Married    Spouse name: Not on file   Number of children: 1   Years of education: Not on file   Highest education level: Not on file  Occupational History   Occupation: respiratory therapist    Employer: [employer]  Tobacco Use   Smoking status: Former    Packs/day: 0.50    Years: 20.00    Total pack years: 10.00    Types: Pipe, Cigarettes    Quit date: 04/03/1990    Years since quitting: 31.4   Smokeless tobacco: Never   Tobacco comments:    quit many years ago  Vaping Use   Vaping Use: Never used  Substance and  Sexual Activity   Alcohol use: Not Currently    Comment: occassional   Drug use: No   Sexual activity: Yes  Other Topics Concern   Not on file  Social History Narrative   Not on file   Social Determinants of Health   Financial Resource Strain: Not on file  Food Insecurity: Not on file  Transportation Needs: Not on file  Physical Activity: Not on file  Stress: Not on file  Social Connections: Not on file  Intimate Partner Violence: Not on file    Outpatient Medications Prior to Visit  Medication Sig Dispense Refill   albuterol (VENTOLIN HFA) 108 (90 Base) MCG/ACT inhaler Inhale 2 puffs into the lungs 2 (two) times daily.     ALPRAZolam (XANAX) 0.5 MG tablet Take 1 tablet (0.5 mg total) by mouth daily as needed for anxiety. (Patient taking differently: Take 0.5 mg by  mouth at bedtime.) 5 tablet 0   aspirin 325 MG tablet Take 325 mg by mouth daily.     atorvastatin (LIPITOR) 40 MG tablet Take 40 mg daily at 6 PM by mouth.      busPIRone (BUSPAR) 10 MG tablet Take 10 mg by mouth 3 (three) times daily.     Calcium Citrate-Vitamin D (CALCIUM + D PO) Take 2 tablets by mouth daily.     diclofenac (VOLTAREN) 75 MG EC tablet Take 75 mg by mouth 2 (two) times daily.     DULoxetine (CYMBALTA) 60 MG capsule Take 1 capsule (60 mg total) by mouth daily. Total of 90 mg daily (60 mg + 30 mg) (Patient taking differently: Take 90 mg by mouth daily.)     fluticasone-salmeterol (ADVAIR) 250-50 MCG/ACT AEPB Inhale 1 puff into the lungs in the morning and at bedtime.     ibuprofen (ADVIL) 200 MG tablet Take 800 mg by mouth daily.     Iron-FA-B Cmp-C-Biot-Probiotic (FUSION PLUS) CAPS Take 1 (one) tablet by mouth twice daily 30 capsule 3   Misc Natural Products (GLUCOSAMINE CHOND DOUBLE STR PO) Take 2 tablets by mouth daily.     Multiple Vitamin (MULTIVITAMIN) tablet Take 1 tablet by mouth daily. Men's 50+     Multiple Vitamins-Minerals (EQ VISION FORMULA 50+ PO) Take 1 tablet by mouth daily.     polyethylene glycol (MIRALAX / GLYCOLAX) 17 g packet Take 17 g by mouth daily as needed for mild constipation. (Patient taking differently: Take 17 g by mouth daily.) 14 each 0   polyethylene glycol-electrolytes (NULYTELY) 420 g solution As directed 4000 mL 0   tiZANidine (ZANAFLEX) 4 MG tablet Take 4 mg by mouth daily.     ferrous sulfate 325 (65 FE) MG tablet Take 325 mg by mouth daily with breakfast. (Patient not taking: Reported on 09/21/2021)     cyclobenzaprine (FLEXERIL) 5 MG tablet Take 1 tablet (5 mg total) by mouth in the morning and at bedtime. (Patient taking differently: Take 5 mg by mouth daily as needed for muscle spasms.) 30 tablet 1   Facility-Administered Medications Prior to Visit  Medication Dose Route Frequency Provider Last Rate Last Admin   bupivacaine-meloxicam ER  (ZYNRELEF) injection 400 mg  400 mg Infiltration Once Carole Civil, MD        Allergies  Allergen Reactions   Sulfa Antibiotics Rash    Childhood     ROS Review of Systems  Constitutional:  Negative for fatigue and fever.  HENT:  Negative for sneezing and sore throat.   Eyes:  Negative for photophobia,  pain and redness.  Respiratory:  Negative for chest tightness and shortness of breath.   Cardiovascular:  Negative for chest pain and palpitations.  Gastrointestinal:  Negative for diarrhea, nausea and vomiting.  Musculoskeletal:  Negative for back pain and neck pain.  Neurological:  Negative for dizziness.  Psychiatric/Behavioral:  Negative for self-injury and suicidal ideas.       Objective:    Physical Exam HENT:     Head: Normocephalic.  Cardiovascular:     Rate and Rhythm: Normal rate and regular rhythm.     Pulses: Normal pulses.     Heart sounds: Normal heart sounds.  Pulmonary:     Effort: Pulmonary effort is normal.     Breath sounds: Normal breath sounds.  Abdominal:     General: Bowel sounds are normal.     Palpations: There is no mass.     Comments: No bruit auscultated  Neurological:     Mental Status: He is alert.     BP 122/72   Pulse 75   Ht _0  (1.803 m)   Wt 167 lb 9.6 oz (76 kg)   SpO2 97%   BMI 23.38 kg/m  Wt Readings from Last 3 Encounters:  09/21/21 167 lb 9.6 oz (76 kg)  09/05/21 165 lb 12.8 oz (75.2 kg)  07/04/21 168 lb (76.2 kg)    Lab Results  Component Value Date   TSH 1.530 06/23/2021   Lab Results  Component Value Date   WBC 12.0 (H) 06/23/2021   HGB 9.5 (L) 06/23/2021   HCT 31.7 (L) 06/23/2021   MCV 68 (L) 06/23/2021   PLT 606 (H) 06/23/2021   Lab Results  Component Value Date   NA 140 06/23/2021   K 5.2 06/23/2021   CO2 22 06/23/2021   GLUCOSE 86 06/23/2021   BUN 16 06/23/2021   CREATININE 0.92 06/23/2021   BILITOT 0.4 06/23/2021   ALKPHOS 131 (H) 06/23/2021   AST 21 06/23/2021   ALT 14 06/23/2021    PROT 6.0 06/23/2021   ALBUMIN 4.1 06/23/2021   CALCIUM 9.5 06/23/2021   ANIONGAP 8 02/09/2021   EGFR 88 06/23/2021   Lab Results  Component Value Date   CHOL 146 06/23/2021   Lab Results  Component Value Date   HDL 36 (L) 06/23/2021   Lab Results  Component Value Date   LDLCALC 94 06/23/2021   Lab Results  Component Value Date   TRIG 81 06/23/2021   Lab Results  Component Value Date   CHOLHDL 4.1 06/23/2021   Lab Results  Component Value Date   HGBA1C 5.3 06/23/2021      Assessment & Plan:   Problem List Items Addressed This Visit       Cardiovascular and Mediastinum   Abdominal aortic aneurysm (AAA) 3.0 cm to 5.5 cm in diameter in male [facility]) - Primary    He mentioned an incidental finding of  a 4 cm AAA a year ago No N/V or abdominal pain noted Will get an U/S of the abdomen to reevaluate the size of the aneurysm       Relevant Orders   US AORTA DUPLEX COMPLETE     Other   Depression    Reports improvement in his mood He shared that his appt with the therapist was canceled twice, and he never spoke with the therapist Nonetheless, he feels great and is noted to be in a better space        Hyperlipidemia   Relevant Orders  CBC with Differential/Platelet   CMP14+EGFR   TSH + free T4   Lipid Profile   Sarcoidosis    He followed up with the pulmonologist on 09/05/21 He is compliant with his Advair and uses his rescue inhaler as needed      Bilateral low back pain    Reports significant improvement with physical therapy  No complaints or concerns today       Other Visit Diagnoses     Vitamin D deficiency       Relevant Orders   Vitamin D (25 hydroxy)   IFG (impaired fasting glucose)       Relevant Orders   CBC with Differential/Platelet   Hemoglobin A1C       No orders of the defined types were placed in this encounter.   Follow-up: Return in about 4 months (around 01/22/2022) for CPE.    [name], FNP

## 2021-09-22 NOTE — Patient Instructions (Signed)
EPHREM CARRICK  09/22/2021     '@PREFPERIOPPHARMACY'$ @   Your procedure is scheduled on  09/28/2021.   Report to [facility] at  1230  P.M.   Call this number if you have problems the morning of surgery:  [phone]   Remember:  Follow the diet and prep instructions given to you by the office.     Use your inhalers before you come and bring your rescue inhaler with you.     Take these medicines the morning of surgery with A SIP OF WATER        buspar, voltaren, cymbalta, zanaflex (if needed).     Do not wear jewelry, make-up or nail polish.  Do not wear lotions, powders, or perfumes, or deodorant.  Do not shave 48 hours prior to surgery.  Men may shave face and neck.  Do not bring valuables to the hospital.  [facility] is not responsible for any belongings or valuables.  Contacts, dentures or bridgework may not be worn into surgery.  Leave your suitcase in the car.  After surgery it may be brought to your room.  For patients admitted to the hospital, discharge time will be determined by your treatment team.  Patients discharged the day of surgery will not be allowed to drive home and must have someone with them for 24 hours.    Special instructions:   DO NOT smoke tobacco or vape for 24 hors before your procedure.  Please read over the following fact sheets that you were given. Anesthesia Post-op Instructions and Care and Recovery After Surgery      Colonoscopy, Adult, Care After The following information offers guidance on how to care for yourself after your procedure. Your health care provider may also give you more specific instructions. If you have problems or questions, contact your health care provider. What can I expect after the procedure? After the procedure, it is common to have: A small amount of blood in your stool for 24 hours after the procedure. Some gas. Mild cramping or bloating of your abdomen. Follow these instructions at home: Eating and  drinking  Drink enough fluid to keep your urine pale yellow. Follow instructions from your health care provider about eating or drinking restrictions. Resume your normal diet as told by your health care provider. Avoid heavy or fried foods that are hard to digest. Activity Rest as told by your health care provider. Avoid sitting for a long time without moving. Get up to take short walks every 1-2 hours. This is important to improve blood flow and breathing. Ask for help if you feel weak or unsteady. Return to your normal activities as told by your health care provider. Ask your health care provider what activities are safe for you. Managing cramping and bloating  Try walking around when you have cramps or feel bloated. If directed, apply heat to your abdomen as told by your health care provider. Use the heat source that your health care provider recommends, such as a moist heat pack or a heating pad. Place a towel between your skin and the heat source. Leave the heat on for 20-30 minutes. Remove the heat if your skin turns bright red. This is especially important if you are unable to feel pain, heat, or cold. You have a greater risk of getting burned. General instructions If you were given a sedative during the procedure, it can affect you for several hours. Do not drive or operate machinery  until your health care provider says that it is safe. For the first 24 hours after the procedure: Do not sign important documents. Do not drink alcohol. Do your regular daily activities at a slower pace than normal. Eat soft foods that are easy to digest. Take over-the-counter and prescription medicines only as told by your health care provider. Keep all follow-up visits. This is important. Contact a health care provider if: You have blood in your stool 2-3 days after the procedure. Get help right away if: You have more than a small spotting of blood in your stool. You have large blood clots in your  stool. You have swelling of your abdomen. You have nausea or vomiting. You have a fever. You have increasing pain in your abdomen that is not relieved with medicine. These symptoms may be an emergency. Get help right away. Call 911. Do not wait to see if the symptoms will go away. Do not drive yourself to the hospital. Summary After the procedure, it is common to have a small amount of blood in your stool. You may also have mild cramping and bloating of your abdomen. If you were given a sedative during the procedure, it can affect you for several hours. Do not drive or operate machinery until your health care provider says that it is safe. Get help right away if you have a lot of blood in your stool, nausea or vomiting, a fever, or increased pain in your abdomen. This information is not intended to replace advice given to you by your health care provider. Make sure you discuss any questions you have with your health care provider. Document Revised: 10/06/2020 Document Reviewed: 10/06/2020 Elsevier Patient Education  [place] After This sheet gives you information about how to care for yourself after your procedure. Your health care provider may also give you more specific instructions. If you have problems or questions, contact your health care provider. What can I expect after the procedure? After the procedure, it is common to have: Tiredness. Forgetfulness about what happened after the procedure. Impaired judgment for important decisions. Nausea or vomiting. Some difficulty with balance. Follow these instructions at home: For the time period you were told by your health care provider:     Rest as needed. Do not participate in activities where you could fall or become injured. Do not drive or use machinery. Do not drink alcohol. Do not take sleeping pills or medicines that cause drowsiness. Do not make important decisions or sign legal  documents. Do not take care of children on your own. Eating and drinking Follow the diet that is recommended by your health care provider. Drink enough fluid to keep your urine pale yellow. If you vomit: Drink water, juice, or soup when you can drink without vomiting. Make sure you have little or no nausea before eating solid foods. General instructions Have a responsible adult stay with you for the time you are told. It is important to have someone help care for you until you are awake and alert. Take over-the-counter and prescription medicines only as told by your health care provider. If you have sleep apnea, surgery and certain medicines can increase your risk for breathing problems. Follow instructions from your health care provider about wearing your sleep device: Anytime you are sleeping, including during daytime naps. While taking prescription pain medicines, sleeping medicines, or medicines that make you drowsy. Avoid smoking. Keep all follow-up visits as told by your health care provider.  This is important. Contact a health care provider if: You keep feeling nauseous or you keep vomiting. You feel light-headed. You are still sleepy or having trouble with balance after 24 hours. You develop a rash. You have a fever. You have redness or swelling around the IV site. Get help right away if: You have trouble breathing. You have new-onset confusion at home. Summary For several hours after your procedure, you may feel tired. You may also be forgetful and have poor judgment. Have a responsible adult stay with you for the time you are told. It is important to have someone help care for you until you are awake and alert. Rest as told. Do not drive or operate machinery. Do not drink alcohol or take sleeping pills. Get help right away if you have trouble breathing, or if you suddenly become confused. This information is not intended to replace advice given to you by your health care  provider. Make sure you discuss any questions you have with your health care provider. Document Revised: 01/18/2021 Document Reviewed: 01/16/2019 Elsevier Patient Education  [place].

## 2021-09-23 LAB — CMP14+EGFR
ALT: 17 IU/L (ref 0–44)
AST: 22 IU/L (ref 0–40)
Albumin/Globulin Ratio: 2 (ref 1.2–2.2)
Albumin: 4.3 g/dL (ref 3.8–4.8)
Alkaline Phosphatase: 122 IU/L — ABNORMAL HIGH (ref 44–121)
BUN/Creatinine Ratio: 13 (ref 10–24)
BUN: 12 mg/dL (ref 8–27)
Bilirubin Total: 0.2 mg/dL (ref 0.0–1.2)
CO2: 23 mmol/L (ref 20–29)
Calcium: 9.5 mg/dL (ref 8.6–10.2)
Chloride: 105 mmol/L (ref 96–106)
Creatinine, Ser: 0.94 mg/dL (ref 0.76–1.27)
Globulin, Total: 2.1 g/dL (ref 1.5–4.5)
Glucose: 78 mg/dL (ref 70–99)
Potassium: 5.3 mmol/L — ABNORMAL HIGH (ref 3.5–5.2)
Sodium: 142 mmol/L (ref 134–144)
Total Protein: 6.4 g/dL (ref 6.0–8.5)
eGFR: 86 mL/min/1.73

## 2021-09-23 LAB — HEMOGLOBIN A1C
Est. average glucose Bld gHb Est-mCnc: 103 mg/dL
Hgb A1c MFr Bld: 5.2 % (ref 4.8–5.6)

## 2021-09-23 LAB — CBC WITH DIFFERENTIAL/PLATELET
Basophils Absolute: 0.1 10*3/uL (ref 0.0–0.2)
Basos: 1 %
EOS (ABSOLUTE): 0.7 10*3/uL — ABNORMAL HIGH (ref 0.0–0.4)
Eos: 9 %
Hematocrit: 40 % (ref 37.5–51.0)
Hemoglobin: 12.2 g/dL — ABNORMAL LOW (ref 13.0–17.7)
Immature Grans (Abs): 0 10*3/uL (ref 0.0–0.1)
Immature Granulocytes: 0 %
Lymphocytes Absolute: 1.9 10*3/uL (ref 0.7–3.1)
Lymphs: 24 %
MCH: 25.2 pg — ABNORMAL LOW (ref 26.6–33.0)
MCHC: 30.5 g/dL — ABNORMAL LOW (ref 31.5–35.7)
MCV: 83 fL (ref 79–97)
Monocytes Absolute: 0.6 10*3/uL (ref 0.1–0.9)
Monocytes: 8 %
Neutrophils Absolute: 4.3 10*3/uL (ref 1.4–7.0)
Neutrophils: 58 %
Platelets: 426 10*3/uL (ref 150–450)
RBC: 4.84 x10E6/uL (ref 4.14–5.80)
RDW: 18.1 % — ABNORMAL HIGH (ref 11.6–15.4)
WBC: 7.6 10*3/uL (ref 3.4–10.8)

## 2021-09-23 LAB — TSH+FREE T4
Free T4: 1.01 ng/dL (ref 0.82–1.77)
TSH: 2.77 u[IU]/mL (ref 0.450–4.500)

## 2021-09-23 LAB — LIPID PANEL
Chol/HDL Ratio: 3.3 ratio (ref 0.0–5.0)
Cholesterol, Total: 150 mg/dL (ref 100–199)
HDL: 45 mg/dL (ref >39)
LDL Chol Calc (NIH): 84 mg/dL (ref 0–99)
Triglycerides: 117 mg/dL (ref 0–149)
VLDL Cholesterol Cal: 21 mg/dL (ref 5–40)

## 2021-09-23 LAB — VITAMIN D 25 HYDROXY (VIT D DEFICIENCY, FRACTURES): Vit D, 25-Hydroxy: 37.4 ng/mL (ref 30.0–100.0)

## 2021-09-23 NOTE — Progress Notes (Signed)
Please inform the patient to continue taking his iron supplements. His iron levels have improved from 3 months ago.

## 2021-09-26 ENCOUNTER — Encounter (HOSPITAL_COMMUNITY)
Admission: RE | Admit: 2021-09-26 | Discharge: 2021-09-26 | Disposition: A | Discharge status: Home / Self Care | Payer: Medicare HMO | Source: Ambulatory Visit | Attending: Internal Medicine | Admitting: Internal Medicine

## 2021-09-26 ENCOUNTER — Encounter (HOSPITAL_COMMUNITY): Payer: Self-pay

## 2021-09-26 VITALS — BP 133/76 | HR 75 | Temp 97.6°F | Resp 18 | Ht 71.0 in | Wt 167.6 lb

## 2021-09-26 DIAGNOSIS — Z01812 Encounter for preprocedural laboratory examination: Secondary | ICD-10-CM | POA: Diagnosis not present

## 2021-09-26 DIAGNOSIS — D649 Anemia, unspecified: Secondary | ICD-10-CM | POA: Insufficient documentation

## 2021-09-26 HISTORY — DX: Restless legs syndrome: G25.81

## 2021-09-26 HISTORY — DX: Chronic obstructive pulmonary disease, unspecified: J44.9

## 2021-09-26 LAB — CBC WITH DIFFERENTIAL/PLATELET
Abs Immature Granulocytes: 0.02 10*3/uL (ref 0.00–0.07)
Basophils Absolute: 0.1 10*3/uL (ref 0.0–0.1)
Basophils Relative: 2 %
Eosinophils Absolute: 0.5 10*3/uL (ref 0.0–0.5)
Eosinophils Relative: 6 %
HCT: 36.5 % — ABNORMAL LOW (ref 39.0–52.0)
Hemoglobin: 11.7 g/dL — ABNORMAL LOW (ref 13.0–17.0)
Immature Granulocytes: 0 %
Lymphocytes Relative: 22 %
Lymphs Abs: 1.8 10*3/uL (ref 0.7–4.0)
MCH: 25.7 pg — ABNORMAL LOW (ref 26.0–34.0)
MCHC: 32.1 g/dL (ref 30.0–36.0)
MCV: 80 fL (ref 80.0–100.0)
Monocytes Absolute: 0.7 10*3/uL (ref 0.1–1.0)
Monocytes Relative: 9 %
Neutro Abs: 4.8 10*3/uL (ref 1.7–7.7)
Neutrophils Relative %: 61 %
Platelets: 373 10*3/uL (ref 150–400)
RBC: 4.56 MIL/uL (ref 4.22–5.81)
RDW: 17.8 % — ABNORMAL HIGH (ref 11.5–15.5)
WBC: 7.9 10*3/uL (ref 4.0–10.5)
nRBC: 0 % (ref 0.0–0.2)

## 2021-09-27 ENCOUNTER — Ambulatory Visit (HOSPITAL_COMMUNITY): Payer: Medicare HMO | Admitting: Physical Therapy

## 2021-09-28 ENCOUNTER — Ambulatory Visit (HOSPITAL_COMMUNITY)
Admission: RE | Admit: 2021-09-28 | Discharge: 2021-09-28 | Disposition: A | Discharge status: Home / Self Care | Payer: Medicare HMO | Source: Ambulatory Visit | Attending: Internal Medicine | Admitting: Internal Medicine

## 2021-09-28 ENCOUNTER — Ambulatory Visit (HOSPITAL_BASED_OUTPATIENT_CLINIC_OR_DEPARTMENT_OTHER): Payer: Medicare HMO | Admitting: Anesthesiology

## 2021-09-28 ENCOUNTER — Encounter (HOSPITAL_COMMUNITY): Payer: Self-pay | Admitting: Internal Medicine

## 2021-09-28 ENCOUNTER — Encounter (HOSPITAL_COMMUNITY)
Admission: RE | Disposition: A | Discharge status: Home / Self Care | Payer: Self-pay | Source: Ambulatory Visit | Attending: Internal Medicine

## 2021-09-28 ENCOUNTER — Ambulatory Visit (HOSPITAL_COMMUNITY): Payer: Medicare HMO | Admitting: Anesthesiology

## 2021-09-28 ENCOUNTER — Other Ambulatory Visit: Payer: Self-pay

## 2021-09-28 DIAGNOSIS — D128 Benign neoplasm of rectum: Secondary | ICD-10-CM | POA: Insufficient documentation

## 2021-09-28 DIAGNOSIS — C189 Malignant neoplasm of colon, unspecified: Secondary | ICD-10-CM | POA: Insufficient documentation

## 2021-09-28 DIAGNOSIS — K640 First degree hemorrhoids: Secondary | ICD-10-CM

## 2021-09-28 DIAGNOSIS — F32A Depression, unspecified: Secondary | ICD-10-CM | POA: Insufficient documentation

## 2021-09-28 DIAGNOSIS — J449 Chronic obstructive pulmonary disease, unspecified: Secondary | ICD-10-CM | POA: Diagnosis not present

## 2021-09-28 DIAGNOSIS — Z86010 Personal history of colonic polyps: Secondary | ICD-10-CM | POA: Diagnosis not present

## 2021-09-28 DIAGNOSIS — D509 Iron deficiency anemia, unspecified: Secondary | ICD-10-CM | POA: Diagnosis not present

## 2021-09-28 DIAGNOSIS — Z87891 Personal history of nicotine dependence: Secondary | ICD-10-CM | POA: Insufficient documentation

## 2021-09-28 DIAGNOSIS — F419 Anxiety disorder, unspecified: Secondary | ICD-10-CM | POA: Diagnosis not present

## 2021-09-28 DIAGNOSIS — Z538 Procedure and treatment not carried out for other reasons: Secondary | ICD-10-CM | POA: Insufficient documentation

## 2021-09-28 DIAGNOSIS — K621 Rectal polyp: Secondary | ICD-10-CM

## 2021-09-28 HISTORY — PX: BIOPSY: SHX5522

## 2021-09-28 HISTORY — PX: COLONOSCOPY WITH PROPOFOL: SHX5780

## 2021-09-28 HISTORY — PX: POLYPECTOMY: SHX5525

## 2021-09-28 SURGERY — COLONOSCOPY WITH PROPOFOL
Anesthesia: General

## 2021-09-28 MED ORDER — SODIUM CHLORIDE FLUSH 0.9 % IV SOLN
INTRAVENOUS | Status: AC
  Filled 2021-09-28: qty 10

## 2021-09-28 MED ORDER — PROPOFOL 10 MG/ML IV BOLUS
INTRAVENOUS | Status: DC | PRN
  Administered 2021-09-28: 100 mg via INTRAVENOUS
  Administered 2021-09-28: 50 mg via INTRAVENOUS

## 2021-09-28 MED ORDER — SPOT INK MARKER SYRINGE KIT
PACK | SUBMUCOSAL | Status: AC
  Filled 2021-09-28: qty 5

## 2021-09-28 MED ORDER — SPOT INK MARKER SYRINGE KIT
PACK | SUBMUCOSAL | Status: DC | PRN
  Administered 2021-09-28: 5 mL via SUBMUCOSAL

## 2021-09-28 MED ORDER — PROPOFOL 500 MG/50ML IV EMUL
INTRAVENOUS | Status: AC
  Filled 2021-09-28: qty 100

## 2021-09-28 MED ORDER — PROPOFOL 500 MG/50ML IV EMUL
INTRAVENOUS | Status: DC | PRN
  Administered 2021-09-28: 150 ug/kg/min via INTRAVENOUS

## 2021-09-28 MED ORDER — SODIUM CHLORIDE FLUSH 0.9 % IV SOLN
INTRAVENOUS | Status: AC
  Filled 2021-09-28: qty 20

## 2021-09-28 MED ORDER — LACTATED RINGERS IV SOLN: INTRAVENOUS | Status: DC | PRN

## 2021-09-28 NOTE — Progress Notes (Signed)
Pt arrived at 1045 for 1420 procedure. At 1143 pt became angry and agitated that he was having to wait so long for his procedure.  Pt was explained that he was scheduled to arrive at 1230 for his procedure and not at 1100. Explained that it was his PAT appt that was scheduled for 1100. Pt and wife was not in agreement with this. Pt threatening to leave. Adrian Prince RN arrived in pre-op and was briefed. Estill Bamberg talked with pt and wife and they are in a agreement to stay and pt is aware that he would be prepared for his procedure at 1230.

## 2021-09-28 NOTE — Progress Notes (Signed)
Dr Gala Romney in ER to see consults. Mrs. Altier in waiting room waiting on pt. I talked with Mrs Bertz and told her that Dr Gala Romney had an emergency and that Mr Boswell was delayed for his procedure. Voiced understanding.

## 2021-09-28 NOTE — Anesthesia Postprocedure Evaluation (Signed)
Anesthesia Post Note  Patient: Albert Gonzalez  Procedure(s) Performed: COLONOSCOPY WITH PROPOFOL BIOPSY POLYPECTOMY  Patient location during evaluation: Phase II Anesthesia Type: General Level of consciousness: awake Pain management: pain level controlled Vital Signs Assessment: post-procedure vital signs reviewed and stable Respiratory status: spontaneous breathing and respiratory function stable Cardiovascular status: blood pressure returned to baseline and stable Postop Assessment: no headache and no apparent nausea or vomiting Anesthetic complications: no Comments: Late entry   No notable events documented.   Last Vitals:  Vitals:   09/28/21 1300 09/28/21 1539  BP: 132/85 104/74  Pulse: 69 69  Resp: 12 20  Temp: 36.9 C (!) 36.3 C  SpO2: 100% 98%    Last Pain:  Vitals:   09/28/21 1539  TempSrc: Oral  PainSc: 0-No pain                 Louann Sjogren

## 2021-09-28 NOTE — Progress Notes (Signed)
Dr Gala Romney at bedside to talk with pt. Pt told Dr Gala Romney that he had not been informed that he had an emergency. Dr Gala Romney explained situation to pt. Pt voiced understanding and will wait and proceed with procedure.

## 2021-09-28 NOTE — Discharge Instructions (Addendum)
  Colonoscopy Discharge Instructions  Read the instructions outlined below and refer to this sheet in the next few weeks. These discharge instructions provide you with general information on caring for yourself after you leave the hospital. Your doctor may also give you specific instructions. While your treatment has been planned according to the most current medical practices available, unavoidable complications occasionally occur. If you have any problems or questions after discharge, call Dr. Gala Romney at [phone]. ACTIVITY You may resume your regular activity, but move at a slower pace for the next 24 hours.  Take frequent rest periods for the next 24 hours.  Walking will help get rid of the air and reduce the bloated feeling in your belly (abdomen).  No driving for 24 hours (because of the medicine (anesthesia) used during the test).   Do not sign any important legal documents or operate any machinery for 24 hours (because of the anesthesia used during the test).  NUTRITION Drink plenty of fluids.  You may resume your normal diet as instructed by your doctor.  Begin with a light meal and progress to your normal diet. Heavy or fried foods are harder to digest and may make you feel sick to your stomach (nauseated).  Avoid alcoholic beverages for 24 hours or as instructed.  MEDICATIONS You may resume your normal medications unless your doctor tells you otherwise.  WHAT YOU CAN EXPECT TODAY Some feelings of bloating in the abdomen.  Passage of more gas than usual.  Spotting of blood in your stool or on the toilet paper.  IF YOU HAD POLYPS REMOVED DURING THE COLONOSCOPY: No aspirin products for 7 days or as instructed.  No alcohol for 7 days or as instructed.  Eat a soft diet for the next 24 hours.  FINDING OUT THE RESULTS OF YOUR TEST Not all test results are available during your visit. If your test results are not back during the visit, make an appointment with your caregiver to find out the  results. Do not assume everything is normal if you have not heard from your caregiver or the medical facility. It is important for you to follow up on all of your test results.  SEEK IMMEDIATE MEDICAL ATTENTION IF: You have more than a spotting of blood in your stool.  Your belly is swollen (abdominal distention).  You are nauseated or vomiting.  You have a temperature over 101.  You have abdominal pain or discomfort that is severe or gets worse throughout the day.     You had a polyp in your rectum which was removed  The right side of your colon was abnormal.  Biopsies taken.  You will need further evaluation.  Contrast abdominal and pelvic CT mass right colon.  Serum CEA today

## 2021-09-28 NOTE — Progress Notes (Signed)
Mr Albert Gonzalez informed that Dr Gala Romney had an emergency and that there was a delay in his procedure getting started. Pt became angry and threw off his blankets and started disconnecting from the monitor. Stated "I am going home." Pt then ask to see his wife. Wife at bedside. Pt request to speak to Dr Gala Romney. Dr Gala Romney notified pt wants to talk to him. Dr Gala Romney will be in to see pt.

## 2021-09-28 NOTE — H&P (Signed)
$'@LOGO'g$ @   Primary Care Physician:  [name], [place] Primary Gastroenterologist:  Dr. Gala Romney  Pre-Procedure History & Physical: HPI:  Albert Gonzalez is a 71 y.o. male here for screening colonoscopy.  Had a small adenoma removed about 10 years ago.  No bowel symptoms at this time.  He is here for follow-up examination.  Past Medical History:  Diagnosis Date   Anxiety    Arthritis    COPD (chronic obstructive pulmonary disease) ([place])    Depression    History of kidney stones    Hyperlipidemia    Inguinal hernia right    Insomnia    Myalgia    PPD positive, treated 1979   1 year of INH   Raynaud phenomenon    Restless leg syndrome    Sarcoidosis 1980    Past Surgical History:  Procedure Laterality Date   AXILLARY LYMPH NODE BIOPSY Right 1980   COLONOSCOPY  02/08/2011   Procedure: COLONOSCOPY;  Surgeon: Daneil Dolin, MD;  Location: [facility];  Service: Endoscopy;  Laterality: N/A;  1:30 PM   INGUINAL HERNIA REPAIR Right 01/29/2017   Procedure: HERNIA REPAIR INGUINAL ADULT WITH MESH;  Surgeon: Virl Cagey, MD;  Location: [facility];  Service: General;  Laterality: Right;   KNEE ARTHROSCOPY WITH MEDIAL MENISECTOMY Right 04/05/2016   Procedure: RIGHT KNEE ARTHROSCOPY WITH PARTIAL  LATERAL MENISECTOMY, DEBRIDEMENT MEDIAL MENISCUS, ACL DEBRIDEMENT, MICRO FRACTURE OF FEMUR;  Surgeon: Carole Civil, MD;  Location: [facility];  Service: Orthopedics;  Laterality: Right;   KNEE SURGERY     left   KYPHOPLASTY     TONSILLECTOMY     TOTAL KNEE ARTHROPLASTY Right 02/08/2021   Procedure: TOTAL KNEE ARTHROPLASTY;  Surgeon: Carole Civil, MD;  Location: [facility];  Service: Orthopedics;  Laterality: Right;    Prior to Admission medications   Medication Sig Start Date End Date Taking? Authorizing Provider  albuterol (VENTOLIN HFA) 108 (90 Base) MCG/ACT inhaler Inhale 2 puffs into the lungs 2 (two) times daily. 10/04/20  Yes [provider]  ALPRAZolam Duanne Moron) 0.5 MG  tablet Take 1 tablet (0.5 mg total) by mouth daily as needed for anxiety. Patient taking differently: Take 0.5 mg by mouth at bedtime. 09/10/20  Yes Johnson, Clanford L, MD  aspirin 325 MG tablet Take 325 mg by mouth daily.   Yes [provider]  atorvastatin (LIPITOR) 40 MG tablet Take 40 mg daily at 6 PM by mouth.    Yes [provider]  busPIRone (BUSPAR) 10 MG tablet Take 10 mg by mouth 3 (three) times daily. 12/03/20  Yes [provider]  Calcium Citrate-Vitamin D (CALCIUM + D PO) Take 2 tablets by mouth daily.   Yes [provider]  diclofenac (VOLTAREN) 75 MG EC tablet Take 75 mg by mouth 2 (two) times daily. 05/12/21  Yes [provider]  DULoxetine (CYMBALTA) 60 MG capsule Take 1 capsule (60 mg total) by mouth daily. Total of 90 mg daily (60 mg + 30 mg) Patient taking differently: Take 90 mg by mouth daily. 09/10/20  Yes Johnson, Clanford L, MD  ferrous sulfate 325 (65 FE) MG tablet Take 325 mg by mouth daily with breakfast.   Yes [provider]  fluticasone-salmeterol (ADVAIR) 250-50 MCG/ACT AEPB Inhale 1 puff into the lungs in the morning and at bedtime.   Yes [provider]  ibuprofen (ADVIL) 200 MG tablet Take 800 mg by mouth daily.   Yes [provider]  Iron-FA-B Cmp-C-Biot-Probiotic (  FUSION PLUS) CAPS Take 1 (one) tablet by mouth twice daily 07/26/21  Yes [name], FNP  Misc Natural Products (GLUCOSAMINE CHOND DOUBLE STR PO) Take 2 tablets by mouth daily.   Yes [provider]  Multiple Vitamin (MULTIVITAMIN) tablet Take 1 tablet by mouth daily. Men's 50+   Yes [provider]  Multiple Vitamins-Minerals (EQ VISION FORMULA 50+ PO) Take 1 tablet by mouth daily.   Yes [provider]  polyethylene glycol (MIRALAX / GLYCOLAX) 17 g packet Take 17 g by mouth daily as needed for mild constipation. Patient taking differently: Take 17 g by mouth daily. 09/10/20  Yes Johnson, Clanford L, MD   tiZANidine (ZANAFLEX) 4 MG tablet Take 4 mg by mouth daily.   Yes [provider]  polyethylene glycol-electrolytes (NULYTELY) 420 g solution As directed 08/26/21   Iracema Lanagan, Cristopher Estimable, MD    Allergies as of 08/26/2021 - Review Complete 08/22/2021  Allergen Reaction Noted   Sulfa antibiotics Rash 01/31/2011    Family History  Problem Relation Age of Onset   Suicidality Father    Colon cancer Neg Hx    Liver disease Neg Hx    Inflammatory bowel disease Neg Hx    Anesthesia problems Neg Hx     Social History   Socioeconomic History   Marital status: Married    Spouse name: Not on file   Number of children: 1   Years of education: Not on file   Highest education level: Not on file  Occupational History   Occupation: respiratory therapist    Employer: [employer]  Tobacco Use   Smoking status: Former    Packs/day: 0.50    Years: 20.00    Total pack years: 10.00    Types: Pipe, Cigarettes    Quit date: 04/03/1990    Years since quitting: 31.5   Smokeless tobacco: Never   Tobacco comments:    quit many years ago  Vaping Use   Vaping Use: Never used  Substance and Sexual Activity   Alcohol use: Not Currently    Comment: occassional   Drug use: No   Sexual activity: Yes  Other Topics Concern   Not on file  Social History Narrative   Not on file   Social Determinants of Health   Financial Resource Strain: Not on file  Food Insecurity: Not on file  Transportation Needs: Not on file  Physical Activity: Not on file  Stress: Not on file  Social Connections: Not on file  Intimate Partner Violence: Not on file    Review of Systems: See HPI, otherwise negative ROS  Physical Exam: BP 132/85 (BP Location: Right Arm)   Pulse 69   Temp 98.4 F (36.9 C)   Resp 12   SpO2 100%  General:   Alert,  Well-developed, well-nourished, pleasant and cooperative in NAD Neck:  Supple; no masses or thyromegaly. No significant cervical adenopathy. Lungs:   Clear throughout to auscultation.   No wheezes, crackles, or rhonchi. No acute distress. Heart:  Regular rate and rhythm; no murmurs, clicks, rubs,  or gallops. Abdomen: Non-distended, normal bowel sounds.  Soft and nontender without appreciable mass or hepatosplenomegaly.  Pulses:  Normal pulses noted. Extremities:  Without clubbing or edema.  Impression/Plan: 71 year old gentleman here for updated colonoscopy.  Tiny colonic adenoma removed 10 years ago.  Here for follow-up colonoscopy per plan. The risks, benefits, limitations, alternatives and imponderables have been reviewed with the patient. Questions have been answered. All parties are agreeable.  Notice: This dictation was prepared with Dragon dictation along with smaller phrase technology. Any transcriptional errors that result from this process are unintentional and may not be corrected upon review.

## 2021-09-28 NOTE — Anesthesia Preprocedure Evaluation (Signed)
Anesthesia Evaluation  Patient identified by MRN, date of birth, ID band Patient awake    Reviewed: Allergy & Precautions, H&P , NPO status , Patient's Chart, lab work & pertinent test results, reviewed documented beta blocker date and time   Airway Mallampati: II  TM Distance: >3 FB Neck ROM: full    Dental no notable dental hx.    Pulmonary asthma , COPD, former smoker,    Pulmonary exam normal breath sounds clear to auscultation       Cardiovascular Exercise Tolerance: Good negative cardio ROS   Rhythm:regular Rate:Normal     Neuro/Psych PSYCHIATRIC DISORDERS Anxiety Depression negative neurological ROS     GI/Hepatic negative GI ROS, Neg liver ROS,   Endo/Other  negative endocrine ROS  Renal/GU negative Renal ROS  negative genitourinary   Musculoskeletal   Abdominal   Peds  Hematology negative hematology ROS (+)   Anesthesia Other Findings   Reproductive/Obstetrics negative OB ROS                             Anesthesia Physical Anesthesia Plan  ASA: 3  Anesthesia Plan: General   Post-op Pain Management:    Induction:   PONV Risk Score and Plan: Propofol infusion  Airway Management Planned:   Additional Equipment:   Intra-op Plan:   Post-operative Plan:   Informed Consent: I have reviewed the patients History and Physical, chart, labs and discussed the procedure including the risks, benefits and alternatives for the proposed anesthesia with the patient or authorized representative who has indicated his/her understanding and acceptance.     Dental Advisory Given  Plan Discussed with: CRNA  Anesthesia Plan Comments:         Anesthesia Quick Evaluation

## 2021-09-28 NOTE — Transfer of Care (Signed)
Immediate Anesthesia Transfer of Care Note  Patient: Albert Gonzalez  Procedure(s) Performed: COLONOSCOPY WITH PROPOFOL BIOPSY POLYPECTOMY  Patient Location: Short Stay  Anesthesia Type:General  Level of Consciousness: awake  Airway & Oxygen Therapy: Patient Spontanous Breathing  Post-op Assessment: Report given to RN and Post -op Vital signs reviewed and stable  Post vital signs: Reviewed and stable  Last Vitals:  Vitals Value Taken Time  BP 104/74 09/28/21 1539  Temp 36.3 C 09/28/21 1539  Pulse 69 09/28/21 1539  Resp 20 09/28/21 1539  SpO2 98 % 09/28/21 1539    Last Pain:  Vitals:   09/28/21 1539  TempSrc: Oral  PainSc: 0-No pain         Complications: No notable events documented.

## 2021-09-28 NOTE — Progress Notes (Signed)
RN went out to waiting area to get a pt. Linker and his wife were standing in doorway talking loudly to Hardie Shackleton RN. This RN was spoke to by both pt and his wife regarding how long they have been waiting for procedure. RN and Olivia Mackie informed pt and wife that we have patients arrive two hours early for preoperative services. Pt stated "well that's simple math and that's three hours, dont you think that's simple math. Well the nurse that told me the time is a stupid nurse" pt then proceeded to flip the end of my clipboard that I was holding. Pt stated "I am getting upset" RN explained to the patient that what I can see on his chart, his procedure time is 1430. Pt then started to yell, I told him "dont get upset"  pt then decided to yell and his wife was talking at the same time.  RN walked away when another pt walked into doorway and RN escorted pt down the hallway  RN apologized to pt walking down the hallway because the patient stated "I am getting scared"  RN apologized to pt and shook my head stated she didn't have to be scared.    Trupiano began to yell down the hall, "shake your head, idiot. I know you're name, its Apolonio Schneiders"

## 2021-09-28 NOTE — Op Note (Signed)
[facility] Patient Name: Albert Gonzalez Procedure Date: 09/28/2021 2:53 PM MRN: [id number] Date of Birth: [birth year] Attending MD: Norvel Richards , MD CSN: [id number] Age: 71 Admit Type: Outpatient Procedure:                Colonoscopy (incomplete with bx/snare polypectomy Indications:              Iron deficiency anemia Providers:                Norvel Richards, MD, Charlsie Quest. Theda Sers RN, RN,                            Rosina Lowenstein, RN, Raphael Gibney, Technician Referring MD:              Medicines:                Propofol per Anesthesia Complications:            No immediate complications. Estimated Blood Loss:     Estimated blood loss was minimal. Procedure:                After obtaining informed consent, the colonoscope                            was passed under direct vision. Throughout the                            procedure, the patient's blood pressure, pulse, and                            oxygen saturations were monitored continuously. The                            [phone]) scope was introduced through the                            anus and advanced to the the more proximal colon                            where a mass was found.. Examination deemed                            incomplete. Scope In: 3:08:24 PM Scope Out: 3:32:00 PM Scope Withdrawal Time: 0 hours 19 minutes 30 seconds  Total Procedure Duration: 0 hours 23 minutes 36 seconds  Findings:      The perianal and digital rectal examinations were normal. Just inside       the anal verge there was a 1.2 cm sessile polyp. It was soft. The       remainder of the rectal mucosa appeared normal. The colonic mucosa was       examined from the rectosigmoid junction through the left and transverse       colon; I encountered a exophytic, hard fibrotic feeling apple core       neoplastic process in the more proximal colon at a flexure?"with       significant compromise in the lumen. Because of the  angulation and       aperture narrowing, I was not  able to see or advance the scope beyond       this level. Unable to say whether this was at or proximal to the hepatic       flexure or more near the cecum with any certainty (see photos for       perspective).      This lesion was demarcated with ink tattoo injected submucosally in a       semilunar distribution about 3 to 4 cm distal to the distal extent of       the tumor.      The rectal polyp was completely resected with 1 pass of hot snare       cautery. Apple core lesion biopsied multiple times with jumbo biopsy       forceps.      Non-bleeding internal hemorrhoids were found. The hemorrhoids were       moderate, medium-sized and Grade I (internal hemorrhoids that do not       prolapse). Impression:               - Distal rectal polyp removed with hot snare                            cautery. Exophytic, apple core appearing neoplastic                            process in the more proximal colon. Location in                            relation to the hepatic flexure or cecum could not                            be ascertained with certainty. Lesion demarcated                            distally with ink. Non-bleeding internal                            hemorrhoids. Moderate Sedation:      Moderate (conscious) sedation was personally administered by an       anesthesia professional. The following parameters were monitored: oxygen       saturation, heart rate, blood pressure, respiratory rate, EKG, adequacy       of pulmonary ventilation, and response to care. Recommendation:           - Patient has a contact number available for                            emergencies. The signs and symptoms of potential                            delayed complications were discussed with the                            patient. Return to normal activities tomorrow.                            Written discharge instructions were provided  to the                             patient.                           - Advance diet as tolerated. Contrast abdominal                            pelvic CT. Serum CEA today. Anticipate oncology and                            colorectal surgical consultation in the near                            future. I spoke face-to-face with patient and his                            wife in PACU regarding today's findings. I                            explained to them there is likely colorectal cancer                            present and he will need at least an operation                            moving forward along with oncology evaluation.                            Further recommendations to follow. Procedure Code(s):        --- Professional ---                           [phone], 32, Colonoscopy, flexible; diagnostic,                            including collection of specimen(s) by brushing or                            washing, when performed (separate procedure) Diagnosis Code(s):        --- Professional ---                           K64.0, First degree hemorrhoids                           D50.9, Iron deficiency anemia, unspecified CPT copyright 2019 American Medical Association. All rights reserved. The codes documented in this report are preliminary and upon coder review may  be revised to meet current compliance requirements. Cristopher Estimable. Brianne Maina, MD Norvel Richards, MD 09/28/2021 4:02:26 PM This report has been signed electronically. Number of Addenda: 0

## 2021-09-29 ENCOUNTER — Encounter (HOSPITAL_COMMUNITY): Payer: Medicare HMO | Admitting: Physical Therapy

## 2021-09-29 ENCOUNTER — Telehealth: Payer: Self-pay | Admitting: Family Medicine

## 2021-09-29 ENCOUNTER — Ambulatory Visit (HOSPITAL_COMMUNITY): Admission: RE | Admit: 2021-09-29 | Payer: Medicare HMO | Source: Ambulatory Visit

## 2021-09-29 NOTE — Telephone Encounter (Signed)
Pt called stating he had a colonoscopy yesterday & it came back with a mass possible cancer. He is wanting Peter Congo to look at his report. Please call pt when available.

## 2021-09-30 ENCOUNTER — Other Ambulatory Visit: Payer: Self-pay | Admitting: Family Medicine

## 2021-09-30 DIAGNOSIS — G4709 Other insomnia: Secondary | ICD-10-CM

## 2021-09-30 LAB — CEA: CEA: 2.8 ng/mL (ref 0.0–4.7)

## 2021-09-30 MED ORDER — HYDROXYZINE PAMOATE 25 MG PO CAPS: 50.0000 mg | ORAL_CAPSULE | Freq: Every day | ORAL | 0 refills | Status: DC

## 2021-09-30 NOTE — Telephone Encounter (Signed)
Spoke with patient.

## 2021-10-02 ENCOUNTER — Telehealth: Payer: Self-pay | Admitting: Internal Medicine

## 2021-10-02 DIAGNOSIS — C801 Malignant (primary) neoplasm, unspecified: Secondary | ICD-10-CM

## 2021-10-02 NOTE — Telephone Encounter (Signed)
done

## 2021-10-02 NOTE — Telephone Encounter (Signed)
I called patient and his wife today and informed them that he has colon cancer.  His CEA is normal.  Benign polyp removed from his rectum.  I recommended he have an abdominal and pelvic CT in the near future.  We will order.  He will need surgery.  He desires to see Dr. Constance Haw whom he has seen previously.  We will make that referral.

## 2021-10-03 ENCOUNTER — Telehealth: Payer: Self-pay | Admitting: Family Medicine

## 2021-10-03 ENCOUNTER — Other Ambulatory Visit: Payer: Self-pay

## 2021-10-03 ENCOUNTER — Encounter (HOSPITAL_COMMUNITY): Payer: Medicare HMO | Admitting: Physical Therapy

## 2021-10-03 LAB — SURGICAL PATHOLOGY

## 2021-10-03 NOTE — Telephone Encounter (Signed)
Noted, routing to [place] to place referral.

## 2021-10-03 NOTE — Telephone Encounter (Signed)
Controlled med, can you send this?

## 2021-10-03 NOTE — Telephone Encounter (Signed)
Referral placed.  CT A/P approved via [employer]. AUTHORIZATION NUMBER [id number], DOS: 10/03/21-11/02/21 Soonest CT could be done was 8/16, arrival 3:15pm, npo 4 hrs prior, p/u oral prep 1-2 days prior.  Called made pt/spouse aware. She voiced understanding.

## 2021-10-03 NOTE — Addendum Note (Signed)
Addended by: Cheron Every on: 10/03/2021 11:10 AM   Modules accepted: Orders

## 2021-10-03 NOTE — Telephone Encounter (Signed)
Patient needs refill on   DULoxetine (CYMBALTA) 60 MG capsule   [facility]

## 2021-10-04 ENCOUNTER — Other Ambulatory Visit: Payer: Self-pay | Admitting: Family Medicine

## 2021-10-04 ENCOUNTER — Encounter (HOSPITAL_COMMUNITY): Payer: Self-pay | Admitting: Internal Medicine

## 2021-10-04 ENCOUNTER — Encounter (HOSPITAL_COMMUNITY): Payer: Medicare HMO | Admitting: Physical Therapy

## 2021-10-04 MED ORDER — DULOXETINE HCL 60 MG PO CPEP: 60.0000 mg | ORAL_CAPSULE | Freq: Every day | ORAL | 2 refills | Status: DC

## 2021-10-04 NOTE — Telephone Encounter (Signed)
Prescription has been sent.

## 2021-10-05 ENCOUNTER — Encounter (HOSPITAL_COMMUNITY): Payer: Medicare HMO | Admitting: Physical Therapy

## 2021-10-07 ENCOUNTER — Telehealth: Payer: Self-pay | Admitting: Family Medicine

## 2021-10-07 ENCOUNTER — Other Ambulatory Visit: Payer: Self-pay | Admitting: Family Medicine

## 2021-10-07 NOTE — Telephone Encounter (Signed)
Rx sent today.

## 2021-10-07 NOTE — Telephone Encounter (Signed)
Patient needs refill on   DULoxetine (CYMBALTA) 60 MG capsule   Patient is out of med

## 2021-10-12 ENCOUNTER — Encounter (HOSPITAL_COMMUNITY): Payer: Medicare HMO | Admitting: Physical Therapy

## 2021-10-12 ENCOUNTER — Ambulatory Visit (HOSPITAL_COMMUNITY)
Admission: RE | Admit: 2021-10-12 | Discharge: 2021-10-12 | Disposition: A | Discharge status: Home / Self Care | Payer: Medicare HMO | Source: Ambulatory Visit | Attending: Internal Medicine | Admitting: Internal Medicine

## 2021-10-12 ENCOUNTER — Encounter (HOSPITAL_COMMUNITY): Payer: Self-pay | Admitting: Radiology

## 2021-10-12 DIAGNOSIS — I700 Atherosclerosis of aorta: Secondary | ICD-10-CM | POA: Diagnosis not present

## 2021-10-12 DIAGNOSIS — K4020 Bilateral inguinal hernia, without obstruction or gangrene, not specified as recurrent: Secondary | ICD-10-CM | POA: Diagnosis not present

## 2021-10-12 DIAGNOSIS — I7143 Infrarenal abdominal aortic aneurysm, without rupture: Secondary | ICD-10-CM | POA: Diagnosis not present

## 2021-10-12 DIAGNOSIS — C801 Malignant (primary) neoplasm, unspecified: Secondary | ICD-10-CM

## 2021-10-12 MED ORDER — IOHEXOL 300 MG/ML  SOLN
100.0000 mL | Freq: Once | INTRAMUSCULAR | Status: AC | PRN
  Administered 2021-10-12: 100 mL via INTRAVENOUS

## 2021-10-17 ENCOUNTER — Encounter: Payer: Self-pay | Admitting: General Surgery

## 2021-10-17 ENCOUNTER — Ambulatory Visit (INDEPENDENT_AMBULATORY_CARE_PROVIDER_SITE_OTHER): Payer: Medicare HMO | Admitting: General Surgery

## 2021-10-17 VITALS — BP 116/75 | HR 89 | Temp 98.3°F | Resp 16

## 2021-10-17 DIAGNOSIS — I714 Abdominal aortic aneurysm, without rupture, unspecified: Secondary | ICD-10-CM | POA: Diagnosis not present

## 2021-10-17 DIAGNOSIS — D490 Neoplasm of unspecified behavior of digestive system: Secondary | ICD-10-CM | POA: Diagnosis not present

## 2021-10-17 MED ORDER — NEOMYCIN SULFATE 500 MG PO TABS: 1000.0000 mg | ORAL_TABLET | ORAL | 0 refills | Status: DC

## 2021-10-17 MED ORDER — METRONIDAZOLE 500 MG PO TABS: 1000.0000 mg | ORAL_TABLET | ORAL | 0 refills | Status: DC

## 2021-10-17 NOTE — Progress Notes (Signed)
[facility] History and Physical  Reason for Referral:*** Referring Physician: ***  Chief Complaint   New Patient (Initial Visit)     Albert Gonzalez is a 71 y.o. male.  HPI: ***.  The *** started *** and has had a duration of ***.  It is associated with ***.  The *** is improved with ***, and is made worse with ***.    Quality*** Context***  Colonoscopy 09/2021- Dr. Gala Romney  Distal rectal polyp removed with hot snare cautery. Exophytic, apple core appearing neoplastic process in the more proximal colon. Location in relation to the hepatic flexure or cecum could not be ascertained with certainty. Lesion demarcated distally with ink. Non-bleeding internal hemorrhoids.  Pathology: FINAL MICROSCOPIC DIAGNOSIS:   A. COLON, PROXIMAL MASS, BIOPSY:  -  Invasive well-differentiated adenocarcinoma.  -  Immunohistochemical stains for mismatch repair proteins are pending  and will be reported in an addendum.   Note: A definitive precursor lesion is not identified, and the lesion  undermines otherwise normal colonic mucosa.  While morphologically the  findings are consistent with a primary colonic adenocarcinoma,  differentiating between a primary at this site (i.e. sampling effect)  versus direct extension or metastasis is not possible in the current  specimen.  Clinical/endoscopic correlation recommended.  Dr Vic Ripper has  peer reviewed the case and agrees with the interpretation.   B. RECTUM, DISTAL, POLYPECTOMY:  -  Tubular adenoma, fragments.  Past Medical History:  Diagnosis Date  . Anxiety   . Arthritis   . COPD (chronic obstructive pulmonary disease) ([place])   . Depression   . History of kidney stones   . Hyperlipidemia   . Inguinal hernia right   . Insomnia   . Myalgia   . PPD positive, treated 1979   1 year of INH  . Raynaud phenomenon   . Restless leg syndrome   . Sarcoidosis 1980    Past Surgical History:  Procedure Laterality Date  . AXILLARY  LYMPH NODE BIOPSY Right 1980  . BIOPSY  09/28/2021   Procedure: BIOPSY;  Surgeon: Daneil Dolin, MD;  Location: [facility];  Service: Endoscopy;;  . COLONOSCOPY  02/08/2011   Procedure: COLONOSCOPY;  Surgeon: Daneil Dolin, MD;  Location: [facility];  Service: Endoscopy;  Laterality: N/A;  1:30 PM  . COLONOSCOPY WITH PROPOFOL N/A 09/28/2021   Procedure: COLONOSCOPY WITH PROPOFOL;  Surgeon: Daneil Dolin, MD;  Location: [facility];  Service: Endoscopy;  Laterality: N/A;  2:15pm  . INGUINAL HERNIA REPAIR Right 01/29/2017   Procedure: HERNIA REPAIR INGUINAL ADULT WITH MESH;  Surgeon: Virl Cagey, MD;  Location: [facility];  Service: General;  Laterality: Right;  . KNEE ARTHROSCOPY WITH MEDIAL MENISECTOMY Right 04/05/2016   Procedure: RIGHT KNEE ARTHROSCOPY WITH PARTIAL  LATERAL MENISECTOMY, DEBRIDEMENT MEDIAL MENISCUS, ACL DEBRIDEMENT, MICRO FRACTURE OF FEMUR;  Surgeon: Carole Civil, MD;  Location: [facility];  Service: Orthopedics;  Laterality: Right;  . KNEE SURGERY     left  . KYPHOPLASTY    . POLYPECTOMY  09/28/2021   Procedure: POLYPECTOMY;  Surgeon: Daneil Dolin, MD;  Location: [facility];  Service: Endoscopy;;  . TONSILLECTOMY    . TOTAL KNEE ARTHROPLASTY Right 02/08/2021   Procedure: TOTAL KNEE ARTHROPLASTY;  Surgeon: Carole Civil, MD;  Location: [facility];  Service: Orthopedics;  Laterality: Right;    Family History  Problem Relation Age of Onset  . Suicidality Father   . Colon cancer Neg Hx   .  Liver disease Neg Hx   . Inflammatory bowel disease Neg Hx   . Anesthesia problems Neg Hx     Social History   Tobacco Use  . Smoking status: Former    Packs/day: 0.50    Years: 20.00    Total pack years: 10.00    Types: Pipe, Cigarettes    Quit date: 04/03/1990    Years since quitting: 31.5  . Smokeless tobacco: Never  . Tobacco comments:    quit many years ago  Vaping Use  . Vaping Use: Never used  Substance Use Topics  . Alcohol use: Not Currently     Comment: occassional  . Drug use: No    Medications: I have reviewed the patient's current medications. Allergies as of 10/17/2021       Reactions   Sulfa Antibiotics Rash   Childhood         Medication List        Accurate as of October 17, 2021 11:23 AM. If you have any questions, ask your nurse or doctor.          STOP taking these medications    ferrous sulfate 325 (65 FE) MG tablet Stopped by: Virl Cagey, MD   polyethylene glycol-electrolytes 420 g solution Commonly known as: NuLYTELY Stopped by: Virl Cagey, MD       TAKE these medications    albuterol 108 (90 Base) MCG/ACT inhaler Commonly known as: VENTOLIN HFA Inhale 2 puffs into the lungs 2 (two) times daily.   ALPRAZolam 0.5 MG tablet Commonly known as: XANAX Take 1 tablet (0.5 mg total) by mouth daily as needed for anxiety. What changed: when to take this   aspirin 325 MG tablet Take 325 mg by mouth daily.   atorvastatin 40 MG tablet Commonly known as: LIPITOR Take 40 mg daily at 6 PM by mouth.   busPIRone 10 MG tablet Commonly known as: BUSPAR Take 10 mg by mouth 3 (three) times daily.   CALCIUM + D PO Take 2 tablets by mouth daily.   diclofenac 75 MG EC tablet Commonly known as: VOLTAREN Take 75 mg by mouth 2 (two) times daily.   DULoxetine 60 MG capsule Commonly known as: CYMBALTA TAKE 1 (ONE) CAPSULE BY MOUTH TWO TIMES DAILY   EQ VISION FORMULA 50+ PO Take 1 tablet by mouth daily.   fluticasone-salmeterol 250-50 MCG/ACT Aepb Commonly known as: ADVAIR Inhale 1 puff into the lungs in the morning and at bedtime.   Fusion Plus Caps Take 1 (one) tablet by mouth twice daily   GLUCOSAMINE CHOND DOUBLE STR PO Take 2 tablets by mouth daily.   hydrOXYzine 25 MG capsule Commonly known as: VISTARIL Take 2 capsules (50 mg total) by mouth at bedtime. give 30-60mn before bedtime   ibuprofen 200 MG tablet Commonly known as: ADVIL Take 800 mg by mouth daily.    multivitamin tablet Take 1 tablet by mouth daily. Men's 50+   polyethylene glycol 17 g packet Commonly known as: MIRALAX / GLYCOLAX Take 17 g by mouth daily as needed for mild constipation. What changed: when to take this   tiZANidine 4 MG tablet Commonly known as: ZANAFLEX Take 4 mg by mouth daily.         ROS:  {Review of Systems:30496}  Blood pressure 116/75, pulse 89, temperature 98.3 F (36.8 C), temperature source Oral, resp. rate 16, SpO2 95 %. Physical Exam  Results: Personally reviewed- ascending colon hepatic flexure apple core lesion, AAA 4.5 cm  CLINICAL DATA:  Recent diagnosis of colon cancer. Staging. * Tracking Code: BO *   EXAM: CT ABDOMEN AND PELVIS WITH CONTRAST   TECHNIQUE: Multidetector CT imaging of the abdomen and pelvis was performed using the standard protocol following bolus administration of intravenous contrast.   RADIATION DOSE REDUCTION: This exam was performed according to the departmental dose-optimization program which includes automated exposure control, adjustment of the mA and/or kV according to patient size and/or use of iterative reconstruction technique.   CONTRAST:  148m OMNIPAQUE IOHEXOL 300 MG/ML  SOLN   COMPARISON:  06/26/2010   FINDINGS: Lower chest: Subpleural reticulation in the peripheral lung bases suggests underlying component of chronic interstitial lung disease/pulmonary fibrosis.   Hepatobiliary: No suspicious focal abnormality within the liver parenchyma. There is no evidence for gallstones, gallbladder wall thickening, or pericholecystic fluid. No intrahepatic or extrahepatic biliary dilation.   Pancreas: No focal mass lesion. No dilatation of the main duct. No intraparenchymal cyst. No peripancreatic edema.   Spleen: No splenomegaly. No focal mass lesion.   Adrenals/Urinary Tract: No adrenal nodule or mass. Left kidney unremarkable. Tiny hypodensity in the right kidney is too small to characterize  and only minimally increased since 20/12 consistent with benign etiology. No followup recommended. No evidence for hydroureter. The urinary bladder appears normal for the degree of distention.   Stomach/Bowel: Stomach is unremarkable. No gastric wall thickening. No evidence of outlet obstruction. Duodenum is normally positioned as is the ligament of Treitz. No small bowel wall thickening. No small bowel dilatation. The terminal ileum is normal. The appendix is normal. No gross colonic mass. Focal area of circumferential wall thickening and luminal narrowing noted in the hepatic flexure of the colon (image 30/series 2) consistent with apple-core lesion and patient's known primary. Moderate to large stool volume noted right and transverse colon.   Vascular/Lymphatic: Infrarenal abdominal aorta measures up to 4.5 cm diameter, increased from 2.5 cm diameter on the study from 9 years ago. There is no gastrohepatic or hepatoduodenal ligament lymphadenopathy. No retroperitoneal or mesenteric lymphadenopathy. No pelvic sidewall lymphadenopathy.   Reproductive: The prostate gland and seminal vesicles are unremarkable.   Other: No intraperitoneal free fluid.   Musculoskeletal: Small bilateral groin hernias evident. Left anterior bladder protrudes through the fascial defect in the left groin hernia. No worrisome lytic or sclerotic osseous abnormality. Degenerative changes noted both hips, right greater than left. Compression fractures noted at L1 and L4, status post augmentation at the L1 level.   IMPRESSION: 1. Apple-core lesion in the hepatic flexure is compatible with the patient's known primary neoplasm. No evidence for metastatic disease in the abdomen or pelvis. 2. Moderate to large stool volume. Imaging features suggest clinical constipation. 3. 4.5 cm infrarenal abdominal aortic aneurysm, increased from 2.5 cm diameter on the study from 9 years ago. Recommend follow-up  CT/MR every 6 months and vascular consultation. This recommendation follows ACR consensus guidelines: White Paper of the ACR Incidental Findings Committee II on Vascular Findings. J Am Coll Radiol 2013; 10:789-794. 4. Small bilateral groin hernias. Left anterior bladder protrudes through the fascial defect in the left groin hernia. 5. Subpleural reticulation in the peripheral lung bases suggests underlying component of chronic interstitial lung disease/pulmonary fibrosis. 6.  Aortic Atherosclerois (ICD10-170.0)     Electronically Signed   By: EMisty StanleyM.D.   On: 10/14/2021 08:48     Assessment & Plan:  RJAYRON MAQUEDAis a 71y.o. male with *** -*** -*** -Follow up ***  All questions were answered  to the satisfaction of the patient and family***.  The risk and benefits of *** were discussed including but not limited to ***.  After careful consideration, JAVION HOLMER has decided to ***.    Virl Cagey 10/17/2021, 11:24 AM

## 2021-10-17 NOTE — Patient Instructions (Addendum)
Colon preparation:  Buy from the Store: Miralax bottle (288g).  Gatorade 64 oz (not red). Dulcolax tablets.   The Day Prior to Surgery: Take 4 ducolax tablets at 7am with water. Drink plenty of clear liquids all day to avoid dehydration, no solid food.    Mix the bottle of Miralax and 64 oz of Gatorade and drink this mixture starting at 10am. Drink it gradually over the next few hours, 8 ounces every 15-30 minutes until it is gone. Finish this by 2pm.  Take 2 neomycin '500mg'$  tablets and 2 metronidazole '500mg'$  tablets at 2 pm. Take 2 neomycin '500mg'$  tablets and 2 metronidazole '500mg'$  tablets at 3pm. Take 2 neomycin '500mg'$  tablets and 2 metronidazole '500mg'$  tablets at 10pm.    Do not eat or drink anything after midnight the night before your surgery.  Do not eat or drink anything that morning, and take medications as instructed by the hospital staff on your preoperative visit.    Referral to Vascular Surgery to Follow your AAA.

## 2021-10-18 ENCOUNTER — Encounter (HOSPITAL_COMMUNITY): Payer: Medicare HMO | Admitting: Physical Therapy

## 2021-10-18 NOTE — H&P (Signed)
[facility] History and Physical  Reason for Referral: Hepatic flexure cancer  Referring Physician:  Dr. Gala Romney   Chief Complaint   New Patient (Initial Visit)     Albert Gonzalez is a 71 y.o. male.  HPI: Albert Gonzalez is a 71 yo who I know from a prior hernia repair. He reports he has been having some constipation and cigarette thin stools. He says that he was worried something was going on and had his colonoscopy. His previous colonoscopy was 10 years ago and only showed a polyp. He has no family history of family cancer. He denies any BRBPR or dark stools.  He knows about a AAA he first noted on a CT done by [facility] he reports. He has not been referred to Vascular for monitoring of this AAA. He is pretty active in retirement and gets around and is here today with his wife. He is ready to get his colon surgery.   Colonoscopy 09/2021- Dr. Gala Romney  Distal rectal polyp removed with hot snare cautery. Exophytic, apple core appearing neoplastic process in the more proximal colon. Location in relation to the hepatic flexure or cecum could not be ascertained with certainty. Lesion demarcated distally with ink. Non-bleeding internal hemorrhoids.  Pathology: FINAL MICROSCOPIC DIAGNOSIS:   A. COLON, PROXIMAL MASS, BIOPSY:  -  Invasive well-differentiated adenocarcinoma.  -  Immunohistochemical stains for mismatch repair proteins are pending  and will be reported in an addendum.   Note: A definitive precursor lesion is not identified, and the lesion  undermines otherwise normal colonic mucosa.  While morphologically the  findings are consistent with a primary colonic adenocarcinoma,  differentiating between a primary at this site (i.e. sampling effect)  versus direct extension or metastasis is not possible in the current  specimen.  Clinical/endoscopic correlation recommended.  Dr Vic Ripper has  peer reviewed the case and agrees with the interpretation.   B. RECTUM, DISTAL,  POLYPECTOMY:  -  Tubular adenoma, fragments.  Past Medical History:  Diagnosis Date   Anxiety    Arthritis    COPD (chronic obstructive pulmonary disease) ([place])    Depression    History of kidney stones    Hyperlipidemia    Inguinal hernia right    Insomnia    Myalgia    PPD positive, treated 1979   1 year of INH   Raynaud phenomenon    Restless leg syndrome    Sarcoidosis 1980    Past Surgical History:  Procedure Laterality Date   AXILLARY LYMPH NODE BIOPSY Right 1980   BIOPSY  09/28/2021   Procedure: BIOPSY;  Surgeon: Daneil Dolin, MD;  [facility]: [facility];  Service: Endoscopy;;   COLONOSCOPY  02/08/2011   Procedure: COLONOSCOPY;  Surgeon: Daneil Dolin, MD;  [facility]: [facility];  Service: Endoscopy;  Laterality: N/A;  1:30 PM   COLONOSCOPY WITH PROPOFOL N/A 09/28/2021   Procedure: COLONOSCOPY WITH PROPOFOL;  Surgeon: Daneil Dolin, MD;  [facility]: [facility];  Service: Endoscopy;  Laterality: N/A;  2:15pm   INGUINAL HERNIA REPAIR Right 01/29/2017   Procedure: HERNIA REPAIR INGUINAL ADULT WITH MESH;  Surgeon: Virl Cagey, MD;  Location: [facility];  Service: General;  Laterality: Right;   KNEE ARTHROSCOPY WITH MEDIAL MENISECTOMY Right 04/05/2016   Procedure: RIGHT KNEE ARTHROSCOPY WITH PARTIAL  LATERAL MENISECTOMY, DEBRIDEMENT MEDIAL MENISCUS, ACL DEBRIDEMENT, MICRO FRACTURE OF FEMUR;  Surgeon: Carole Civil, MD;  Location: [facility];  Service: Orthopedics;  Laterality: Right;   KNEE  SURGERY     left   KYPHOPLASTY     POLYPECTOMY  09/28/2021   Procedure: POLYPECTOMY;  Surgeon: Daneil Dolin, MD;  [facility]: [facility];  Service: Endoscopy;;   TONSILLECTOMY     TOTAL KNEE ARTHROPLASTY Right 02/08/2021   Procedure: TOTAL KNEE ARTHROPLASTY;  Surgeon: Carole Civil, MD;  Location: [facility];  Service: Orthopedics;  Laterality: Right;    Family History  Problem Relation Age of Onset   Suicidality Father    Colon cancer Neg Hx    Liver disease Neg Hx     Inflammatory bowel disease Neg Hx    Anesthesia problems Neg Hx     Social History   Tobacco Use   Smoking status: Former    Packs/day: 0.50    Years: 20.00    Total pack years: 10.00    Types: Pipe, Cigarettes    Quit date: 04/03/1990    Years since quitting: 31.5   Smokeless tobacco: Never   Tobacco comments:    quit many years ago  Vaping Use   Vaping Use: Never used  Substance Use Topics   Alcohol use: Not Currently    Comment: occassional   Drug use: No    Medications: I have reviewed the patient's current medications. Allergies as of 10/17/2021       Reactions   Sulfa Antibiotics Rash   Childhood         Medication List        Accurate as of October 17, 2021 11:23 AM. If you have any questions, ask your nurse or doctor.          STOP taking these medications    ferrous sulfate 325 (65 FE) MG tablet Stopped by: Virl Cagey, MD   polyethylene glycol-electrolytes 420 g solution Commonly known as: NuLYTELY Stopped by: Virl Cagey, MD       TAKE these medications    albuterol 108 (90 Base) MCG/ACT inhaler Commonly known as: VENTOLIN HFA Inhale 2 puffs into the lungs 2 (two) times daily.   ALPRAZolam 0.5 MG tablet Commonly known as: XANAX Take 1 tablet (0.5 mg total) by mouth daily as needed for anxiety. What changed: when to take this   aspirin 325 MG tablet Take 325 mg by mouth daily.   atorvastatin 40 MG tablet Commonly known as: LIPITOR Take 40 mg daily at 6 PM by mouth.   busPIRone 10 MG tablet Commonly known as: BUSPAR Take 10 mg by mouth 3 (three) times daily.   CALCIUM + D PO Take 2 tablets by mouth daily.   diclofenac 75 MG EC tablet Commonly known as: VOLTAREN Take 75 mg by mouth 2 (two) times daily.   DULoxetine 60 MG capsule Commonly known as: CYMBALTA TAKE 1 (ONE) CAPSULE BY MOUTH TWO TIMES DAILY   EQ VISION FORMULA 50+ PO Take 1 tablet by mouth daily.   fluticasone-salmeterol 250-50 MCG/ACT  Aepb Commonly known as: ADVAIR Inhale 1 puff into the lungs in the morning and at bedtime.   Fusion Plus Caps Take 1 (one) tablet by mouth twice daily   GLUCOSAMINE CHOND DOUBLE STR PO Take 2 tablets by mouth daily.   hydrOXYzine 25 MG capsule Commonly known as: VISTARIL Take 2 capsules (50 mg total) by mouth at bedtime. give 30-25mn before bedtime   ibuprofen 200 MG tablet Commonly known as: ADVIL Take 800 mg by mouth daily.   multivitamin tablet Take 1 tablet by mouth daily. Men's 50+   polyethylene  glycol 17 g packet Commonly known as: MIRALAX / GLYCOLAX Take 17 g by mouth daily as needed for mild constipation. What changed: when to take this   tiZANidine 4 MG tablet Commonly known as: ZANAFLEX Take 4 mg by mouth daily.         ROS:  A comprehensive review of systems was negative except for: Respiratory: positive for SOB Gastrointestinal: positive for abdominal pain, constipation, nausea, and reflux symptoms Musculoskeletal: positive for back pain  Blood pressure 116/75, pulse 89, temperature 98.3 F (36.8 C), temperature source Oral, resp. rate 16, SpO2 95 %. Physical Exam Vitals reviewed.  Constitutional:      Appearance: Normal appearance.  HENT:     Head: Normocephalic.     Nose: Nose normal.  Cardiovascular:     Rate and Rhythm: Normal rate and regular rhythm.  Pulmonary:     Effort: Pulmonary effort is normal.     Breath sounds: Normal breath sounds.  Abdominal:     General: There is no distension.     Palpations: Abdomen is soft.     Tenderness: There is abdominal tenderness.     Comments: Right sided fullness and tenderness   Musculoskeletal:        General: Normal range of motion.     Cervical back: Normal range of motion.  Skin:    General: Skin is warm.  Neurological:     General: No focal deficit present.     Mental Status: He is alert.  Psychiatric:        Mood and Affect: Mood normal.        Thought Content: Thought content  normal.     Results: Personally reviewed- ascending colon hepatic flexure apple core lesion, AAA 4.5 cm, disagree on the inguinal hernia read- there right side hernia repair is intact and plug is noted in the preperitoneal space, no recurrence   CLINICAL DATA:  Recent diagnosis of colon cancer. Staging. * Tracking Code: BO *   EXAM: CT ABDOMEN AND PELVIS WITH CONTRAST   TECHNIQUE: Multidetector CT imaging of the abdomen and pelvis was performed using the standard protocol following bolus administration of intravenous contrast.   RADIATION DOSE REDUCTION: This exam was performed according to the departmental dose-optimization program which includes automated exposure control, adjustment of the mA and/or kV according to patient size and/or use of iterative reconstruction technique.   CONTRAST:  152m OMNIPAQUE IOHEXOL 300 MG/ML  SOLN   COMPARISON:  06/26/2010   FINDINGS: Lower chest: Subpleural reticulation in the peripheral lung bases suggests underlying component of chronic interstitial lung disease/pulmonary fibrosis.   Hepatobiliary: No suspicious focal abnormality within the liver parenchyma. There is no evidence for gallstones, gallbladder wall thickening, or pericholecystic fluid. No intrahepatic or extrahepatic biliary dilation.   Pancreas: No focal mass lesion. No dilatation of the main duct. No intraparenchymal cyst. No peripancreatic edema.   Spleen: No splenomegaly. No focal mass lesion.   Adrenals/Urinary Tract: No adrenal nodule or mass. Left kidney unremarkable. Tiny hypodensity in the right kidney is too small to characterize and only minimally increased since 20/12 consistent with benign etiology. No followup recommended. No evidence for hydroureter. The urinary bladder appears normal for the degree of distention.   Stomach/Bowel: Stomach is unremarkable. No gastric wall thickening. No evidence of outlet obstruction. Duodenum is normally positioned as  is the ligament of Treitz. No small bowel wall thickening. No small bowel dilatation. The terminal ileum is normal. The appendix is normal. No gross colonic mass. Focal  area of circumferential wall thickening and luminal narrowing noted in the hepatic flexure of the colon (image 30/series 2) consistent with apple-core lesion and patient's known primary. Moderate to large stool volume noted right and transverse colon.   Vascular/Lymphatic: Infrarenal abdominal aorta measures up to 4.5 cm diameter, increased from 2.5 cm diameter on the study from 9 years ago. There is no gastrohepatic or hepatoduodenal ligament lymphadenopathy. No retroperitoneal or mesenteric lymphadenopathy. No pelvic sidewall lymphadenopathy.   Reproductive: The prostate gland and seminal vesicles are unremarkable.   Other: No intraperitoneal free fluid.   Musculoskeletal: Small bilateral groin hernias evident. Left anterior bladder protrudes through the fascial defect in the left groin hernia. No worrisome lytic or sclerotic osseous abnormality. Degenerative changes noted both hips, right greater than left. Compression fractures noted at L1 and L4, status post augmentation at the L1 level.   IMPRESSION: 1. Apple-core lesion in the hepatic flexure is compatible with the patient's known primary neoplasm. No evidence for metastatic disease in the abdomen or pelvis. 2. Moderate to large stool volume. Imaging features suggest clinical constipation. 3. 4.5 cm infrarenal abdominal aortic aneurysm, increased from 2.5 cm diameter on the study from 9 years ago. Recommend follow-up CT/MR every 6 months and vascular consultation. This recommendation follows ACR consensus guidelines: White Paper of the ACR Incidental Findings Committee II on Vascular Findings. J Am Coll Radiol 2013; 10:789-794. 4. Small bilateral groin hernias. Left anterior bladder protrudes through the fascial defect in the left groin hernia. 5.  Subpleural reticulation in the peripheral lung bases suggests underlying component of chronic interstitial lung disease/pulmonary fibrosis. 6.  Aortic Atherosclerois (ICD10-170.0)     Electronically Signed   By: Misty Stanley M.D.   On: 10/14/2021 08:48     Assessment & Plan:  Albert Gonzalez is a 71 y.o. male with a hepatic flexure  colon cancer and known AAA that needs to be followed. He has not had any other prior abdominal surgeries.   Discussed laparoscopic right hemicolectomy and risk of bleeding, infection, anastomotic leak, ureter injury. Discussed possibly needing more surgery. Discussed that we will try laparoscopic but could end up needing to be an open surgery. Discussed the bowel preparation and the post operative course.   Colon Preparation:  Buy from the Store: Miralax bottle [phone]).  Gatorade 64 oz (not red). Dulcolax tablets.   The Day Prior to Surgery: Take 4 ducolax tablets at 7am with water. Drink plenty of clear liquids all day to avoid dehydration, no solid food.    Mix the bottle of Miralax and 64 oz of Gatorade and drink this mixture starting at 10am. Drink it gradually over the next few hours, 8 ounces every 15-30 minutes until it is gone. Finish this by 2pm.  Take 2 neomycin 520m tablets and 2 metronidazole 5039mtablets at 2 pm. Take 2 neomycin 50061mablets and 2 metronidazole 500m27mblets at 3pm. Take 2 neomycin 500mg60mlets and 2 metronidazole 500mg 3mets at 10pm.    Do not eat or drink anything after midnight the night before your surgery.  Do not eat or drink anything that morning, and take medications as instructed by the hospital staff on your preoperative visit.    All questions were answered to the satisfaction of the patient and family.   LindsaVirl [email or web address], 11:24 AM

## 2021-10-19 ENCOUNTER — Other Ambulatory Visit: Payer: Self-pay

## 2021-10-19 ENCOUNTER — Telehealth: Payer: Self-pay | Admitting: Family Medicine

## 2021-10-19 DIAGNOSIS — M545 Low back pain, unspecified: Secondary | ICD-10-CM

## 2021-10-19 MED ORDER — TIZANIDINE HCL 4 MG PO TABS: 4.0000 mg | ORAL_TABLET | Freq: Every day | ORAL | 0 refills | Status: DC

## 2021-10-19 NOTE — Telephone Encounter (Signed)
Pt called stating his is needing a refill on tiZANidine (ZANAFLEX) 4 MG tablet . He only has 2 left. Can you please refill?    [name]

## 2021-10-19 NOTE — Telephone Encounter (Signed)
Rx sent pt informed.

## 2021-10-20 ENCOUNTER — Encounter (HOSPITAL_COMMUNITY): Payer: Medicare HMO

## 2021-10-25 ENCOUNTER — Encounter (HOSPITAL_COMMUNITY): Payer: Medicare HMO | Admitting: Physical Therapy

## 2021-10-26 ENCOUNTER — Other Ambulatory Visit: Payer: Self-pay | Admitting: Family Medicine

## 2021-10-26 DIAGNOSIS — I700 Atherosclerosis of aorta: Secondary | ICD-10-CM | POA: Insufficient documentation

## 2021-10-27 ENCOUNTER — Encounter (HOSPITAL_COMMUNITY): Payer: Medicare HMO

## 2021-10-27 NOTE — Patient Instructions (Addendum)
Albert Gonzalez  10/27/2021     '@PREFPERIOPPHARMACY'$ @   Your procedure is scheduled on  11/02/2021.   Report to [facility] at  0600  A.M.   Call this number if you have problems the morning of surgery:  [phone]   Remember:  Do not eat  after midnight.  You may drink clear liquids until  0330 am on 11/02/2021.    Signed                 Past Medical History:  Diagnosis Date   Anxiety     Arthritis     COPD (chronic obstructive pulmonary disease) ([place])     Depression    History of kidney stones     Hyperlipidemia     Inguinal hernia right     Insomnia     Myalgia     PPD positive, treated 1979    1 year of INH   Raynaud phenomenon     Restless leg syndrome     Sarcoidosis 1980           Past Surgical History:  Procedure Laterality Date   AXILLARY LYMPH NODE BIOPSY Right 1980   BIOPSY   09/28/2021    Procedure: BIOPSY;  Surgeon: Daneil Dolin, MD;  [facility]: [facility];  Service: Endoscopy;;   COLONOSCOPY   02/08/2011    Procedure: COLONOSCOPY;  Surgeon: Daneil Dolin, MD;  [facility];  Service: Endoscopy;  Laterality: N/A;  1:30 PM   COLONOSCOPY WITH PROPOFOL N/A 09/28/2021    Procedure: COLONOSCOPY WITH PROPOFOL;  Surgeon: Daneil Dolin, MD;  [facility]: [facility];  Service: Endoscopy;  Laterality: N/A;  2:15pm   INGUINAL HERNIA REPAIR Right 01/29/2017    Procedure: HERNIA REPAIR INGUINAL ADULT WITH MESH;  Surgeon: Virl Cagey, MD;  Location: [facility];  Service: General;  Laterality: Right;   KNEE ARTHROSCOPY WITH MEDIAL MENISECTOMY Right 04/05/2016    Procedure: RIGHT KNEE ARTHROSCOPY WITH PARTIAL  LATERAL MENISECTOMY, DEBRIDEMENT MEDIAL MENISCUS, ACL DEBRIDEMENT, MICRO FRACTURE OF FEMUR;  Surgeon: Carole Civil, MD;  Location: [facility];  Service: Orthopedics;  Laterality: Right;   KNEE SURGERY        left   KYPHOPLASTY       POLYPECTOMY   09/28/2021    Procedure: POLYPECTOMY;  Surgeon: Daneil Dolin, MD;  [facility]  [facility];  Service: Endoscopy;;   TONSILLECTOMY       TOTAL KNEE ARTHROPLASTY Right 02/08/2021    Procedure: TOTAL KNEE ARTHROPLASTY;  Surgeon: Carole Civil, MD;  Location: [facility];  Service: Orthopedics;  Laterality: Right;           Family History  Problem Relation Age of Onset   Suicidality Father     Colon cancer Neg Hx     Liver disease Neg Hx     Inflammatory bowel disease Neg Hx     Anesthesia problems Neg Hx        Social History         Tobacco Use   Smoking status: Former      Packs/day: 0.50      Years: 20.00      Total pack years: 10.00      Types: Pipe, Cigarettes      Quit date: 04/03/1990      Years since quitting: 31.5   Smokeless tobacco: Never   Tobacco comments:  quit many years ago  Vaping Use   Vaping Use: Never used  Substance Use Topics   Alcohol use: Not Currently      Comment: occassional   Drug use: No             Reactions    Sulfa Antibiotics Rash    Childhood             Medication List                        STOP taking these medications     ferrous sulfate 325 (65 FE) MG tablet Stopped by: Virl Cagey, MD    polyethylene glycol-electrolytes 420 g solution Commonly known as: NuLYTELY Stopped by: Virl Cagey, MD           TAKE these medications     albuterol 108 (90 Base) MCG/ACT inhaler Commonly known as: VENTOLIN HFA Inhale 2 puffs into the lungs 2 (two) times daily.    ALPRAZolam 0.5 MG tablet Commonly known as: XANAX Take 1 tablet (0.5 mg total) by mouth daily as needed for anxiety. What changed: when to take this    aspirin 325 MG tablet Take 325 mg by mouth daily.    atorvastatin 40 MG tablet Commonly known as: LIPITOR Take 40 mg daily at 6 PM by mouth.    busPIRone 10 MG tablet Commonly known as: BUSPAR Take 10 mg by mouth 3 (three) times daily.    CALCIUM + D PO Take 2 tablets by mouth daily.    diclofenac 75 MG EC tablet Commonly known as: VOLTAREN Take 75 mg  by mouth 2 (two) times daily.    DULoxetine 60 MG capsule Commonly known as: CYMBALTA TAKE 1 (ONE) CAPSULE BY MOUTH TWO TIMES DAILY    EQ VISION FORMULA 50+ PO Take 1 tablet by mouth daily.    fluticasone-salmeterol 250-50 MCG/ACT Aepb Commonly known as: ADVAIR Inhale 1 puff into the lungs in the morning and at bedtime.    Fusion Plus Caps Take 1 (one) tablet by mouth twice daily    GLUCOSAMINE CHOND DOUBLE STR PO Take 2 tablets by mouth daily.    hydrOXYzine 25 MG capsule Commonly known as: VISTARIL Take 2 capsules (50 mg total) by mouth at bedtime. give 30-39mn before bedtime    ibuprofen 200 MG tablet Commonly known as: ADVIL Take 800 mg by mouth daily.    multivitamin tablet Take 1 tablet by mouth daily. Men's 50+    polyethylene glycol 17 g packet Commonly known as: MIRALAX / GLYCOLAX Take 17 g by mouth daily as needed for mild constipation. What changed: when to take this                      colon Preparation:  Buy from the Store: Miralax bottle (288g).  Gatorade 64 oz (not red). Dulcolax tablets.    The Day Prior to Surgery: Take 4 ducolax tablets at 7am with water. Drink plenty of clear liquids all day to avoid dehydration, no solid food.    Mix the bottle of Miralax and 64 oz of Gatorade and drink this mixture starting at 10am. Drink it gradually over the next few hours, 8 ounces every 15-30 minutes until it is gone. Finish this by 2pm.  Take 2 neomycin '500mg'$  tablets and 2 metronidazole '500mg'$  tablets at 2 pm. Take 2 neomycin '500mg'$  tablets and 2 metronidazole '500mg'$  tablets at 3pm. Take 2 neomycin '500mg'$   tablets and 2 metronidazole '500mg'$  tablets at 10pm.     Drink 2 carb drinks at 10 pm   Do not eat anything after midnight the night before your surgery.   You may have clear liquids until 0330 am the morning of your procedure.    Drink 1 carb drink at 0330 AM. You can have nothing else to drink after this.                     09/06          Use your inhaler before you come and bring your rescue inhaler with you.    Take these medicines the morning of surgery with A SIP OF WATER               Buspar, voltaren, cymbalta, zanaflex (if needed).     Do not wear jewelry, make-up or nail polish.  Do not wear lotions, powders, or perfumes, or deodorant.  Do not shave 48 hours prior to surgery.  Men may shave face and neck.  Do not bring valuables to the hospital.  [facility] is not responsible for any belongings or valuables.  Contacts, dentures or bridgework may not be worn into surgery.  Leave your suitcase in the car.  After surgery it may be brought to your room.  For patients admitted to the hospital, discharge time will be determined by your treatment team.  Patients discharged the day of surgery will not be allowed to drive home and must have someone with them for 24 hours.    Special instructions:   DO NOT smoke tobacco or vape for 24 hours before your procedure.  Please read over the following fact sheets that you were given. Pain Booklet, Coughing and Deep Breathing, Blood Transfusion Information, Surgical Site Infection Prevention, Anesthesia Post-op Instructions, and Care and Recovery After Surgery       Minimally Invasive Partial Colectomy, Adult, Care After The following information offers guidance on how to care for yourself after your procedure. Your health care provider may also give you more specific instructions. If you have problems or questions, contact your health care provider. What can I expect after the surgery? After the procedure, it is common to have: Pain, bruising, and swelling. Bloating. Weakness and tiredness (fatigue). Changes to your bowel movements, especially having bowel movements more often. Follow these instructions at home: Medicines Take over-the-counter and prescription medicines only as told by your health care provider. If you were prescribed an antibiotic medicine,  take it as told by your health care provider. Do not stop using the antibiotic even if you start to feel better. Ask your health care provider if the medicine prescribed to you: Requires you to avoid driving or using machinery. Can cause constipation. You may need to take these actions to prevent or treat constipation: Drink enough fluids to keep your urine pale yellow. Take over-the-counter or prescription medicines. Limit foods that are high in fat and processed sugars, such as fried or sweet foods. Eating and drinking Follow instructions from your health care provider about what you may eat and drink. Do not drink alcohol if your health care provider tells you not to drink. Eat a low-fiber diet for the first 4 weeks after surgery or as told by your health care provider.. Most people on a low-fiber eating plan should eat less than 10 grams (g) of fiber a day. Follow recommendations from your health care provider or dietitian about how much fiber you  should have each day. Always check food labels to know the fiber content of packaged foods. In general, a low-fiber food will have fewer than 2 g of fiber per serving. In general, try to avoid whole grains, raw fruits and vegetables, dried fruit, tough cuts of meat, nuts, and seeds. Incision care  Follow instructions from your health care provider about how to take care of your incisions. Make sure you: Wash your hands with soap and water for at least 20 seconds before and after you change your bandage (dressing). If soap and water are not available, use hand sanitizer. Change your dressing as told by your health care provider. Leave stitches (sutures), skin glue, or adhesive strips in place. These skin closures may need to stay in place for 2 weeks or longer. If adhesive strip edges start to loosen and curl up, you may trim the loose edges. Do not remove adhesive strips completely unless your health care provider tells you to do that. Check your  incision area every day for signs of infection. Check for: More redness, swelling, or pain. Fluid or blood. Warmth. Pus or a bad smell. Activity Rest as told by your health care provider. Avoid sitting for a long time without moving. Get up to take short walks every 1-2 hours. This is important to improve blood flow and breathing. Ask for help if you feel weak or unsteady. You may have to avoid lifting. Ask your health care provider how much you can safely lift. Return to your normal activities as told by your health care provider. Ask your health care provider what activities are safe for you. General instructions Do not use any products that contain nicotine or tobacco. These products include cigarettes, chewing tobacco, and vaping devices, such as e-cigarettes. If you need help quitting, ask your health care provider. Do not take baths, swim, or use a hot tub until your health care provider approves. Ask your health care provider if you may take showers. You may only be allowed to take sponge baths. Wear compression stockings as told by your health care provider. These stockings help to prevent blood clots and reduce swelling in your legs. Keep all follow-up visits. This is important to monitor healing and check for any complications. Contact a health care provider if: Medicine is not controlling your pain. You have chills or fever. You have any signs of infection in your incision areas. You have a persistent cough. You have nausea or vomiting. You develop a rash. You have not had a bowel movement in 3 days. Get help right away if: You have severe pain. Your incisions break open after sutures or staples have been removed. You are bleeding from your rectum or have blood in your stool. You have a warm, tender swelling in your leg. You have chest pain or trouble breathing. You have increased swelling in the abdomen. You feel light-headed or you faint. These symptoms may be an  emergency. Get help right away. Call 911. Do not wait to see if the symptoms will go away. Do not drive yourself to the hospital. Summary After surgery, it is common to have some pain, bruising, swelling, bloating, tiredness, weakness, or changes to your bowel movements. Follow instructions from your health care provider about what to eat and drink. Return to your normal activities as told by your health care provider. Check your incision area every day for signs of infection. Get help right away if you have chest pain or trouble breathing. This information is  not intended to replace advice given to you by your health care provider. Make sure you discuss any questions you have with your health care provider. Document Revised: 06/01/2021 Document Reviewed: 06/01/2021 Elsevier Patient Education  [place] Anesthesia, Adult, Care After The following information offers guidance on how to care for yourself after your procedure. Your health care provider may also give you more specific instructions. If you have problems or questions, contact your health care provider. What can I expect after the procedure? After the procedure, it is common for people to: Have pain or discomfort at the IV site. Have nausea or vomiting. Have a sore throat or hoarseness. Have trouble concentrating. Feel cold or chills. Feel weak, sleepy, or tired (fatigue). Have soreness and body aches. These can affect parts of the body that were not involved in surgery. Follow these instructions at home: For the time period you were told by your health care provider:  Rest. Do not participate in activities where you could fall or become injured. Do not drive or use machinery. Do not drink alcohol. Do not take sleeping pills or medicines that cause drowsiness. Do not make important decisions or sign legal documents. Do not take care of children on your own. General instructions Drink enough fluid to keep  your urine pale yellow. If you have sleep apnea, surgery and certain medicines can increase your risk for breathing problems. Follow instructions from your health care provider about wearing your sleep device: Anytime you are sleeping, including during daytime naps. While taking prescription pain medicines, sleeping medicines, or medicines that make you drowsy. Return to your normal activities as told by your health care provider. Ask your health care provider what activities are safe for you. Take over-the-counter and prescription medicines only as told by your health care provider. Do not use any products that contain nicotine or tobacco. These products include cigarettes, chewing tobacco, and vaping devices, such as e-cigarettes. These can delay incision healing after surgery. If you need help quitting, ask your health care provider. Contact a health care provider if: You have nausea or vomiting that does not get better with medicine. You vomit every time you eat or drink. You have pain that does not get better with medicine. You cannot urinate or have bloody urine. You develop a skin rash. You have a fever. Get help right away if: You have trouble breathing. You have chest pain. You vomit blood. These symptoms may be an emergency. Get help right away. Call 911. Do not wait to see if the symptoms will go away. Do not drive yourself to the hospital. Summary After the procedure, it is common to have a sore throat, hoarseness, nausea, vomiting, or to feel weak, sleepy, or fatigue. For the time period you were told by your health care provider, do not drive or use machinery. Get help right away if you have difficulty breathing, have chest pain, or vomit blood. These symptoms may be an emergency. This information is not intended to replace advice given to you by your health care provider. Make sure you discuss any questions you have with your health care provider. Document Revised: 05/13/2021  Document Reviewed: 05/13/2021 Elsevier Patient Education  [place]. How to Use Chlorhexidine Before Surgery Chlorhexidine gluconate (CHG) is a germ-killing (antiseptic) solution that is used to clean the skin. It can get rid of the bacteria that normally live on the skin and can keep them away for about 24 hours. To clean your skin with  CHG, you may be given: A CHG solution to use in the shower or as part of a sponge bath. A prepackaged cloth that contains CHG. Cleaning your skin with CHG may help lower the risk for infection: While you are staying in the intensive care unit of the hospital. If you have a vascular access, such as a central line, to provide short-term or long-term access to your veins. If you have a catheter to drain urine from your bladder. If you are on a ventilator. A ventilator is a machine that helps you breathe by moving air in and out of your lungs. After surgery. What are the risks? Risks of using CHG include: A skin reaction. Hearing loss, if CHG gets in your ears and you have a perforated eardrum. Eye injury, if CHG gets in your eyes and is not rinsed out. The CHG product catching fire. Make sure that you avoid smoking and flames after applying CHG to your skin. Do not use CHG: If you have a chlorhexidine allergy or have previously reacted to chlorhexidine. On babies younger than 82 months of age. How to use CHG solution Use CHG only as told by your health care provider, and follow the instructions on the label. Use the full amount of CHG as directed. Usually, this is one bottle. During a shower Follow these steps when using CHG solution during a shower (unless your health care provider gives you different instructions): Start the shower. Use your normal soap and shampoo to wash your face and hair. Turn off the shower or move out of the shower stream. Pour the CHG onto a clean washcloth. Do not use any type of brush or rough-edged sponge. Starting at  your neck, lather your body down to your toes. Make sure you follow these instructions: If you will be having surgery, pay special attention to the part of your body where you will be having surgery. Scrub this area for at least 1 minute. Do not use CHG on your head or face. If the solution gets into your ears or eyes, rinse them well with water. Avoid your genital area. Avoid any areas of skin that have broken skin, cuts, or scrapes. Scrub your back and under your arms. Make sure to wash skin folds. Let the lather sit on your skin for 1-2 minutes or as long as told by your health care provider. Thoroughly rinse your entire body in the shower. Make sure that all body creases and crevices are rinsed well. Dry off with a clean towel. Do not put any substances on your body afterward--such as powder, lotion, or perfume--unless you are told to do so by your health care provider. Only use lotions that are recommended by the manufacturer. Put on clean clothes or pajamas. If it is the night before your surgery, sleep in clean sheets.  During a sponge bath Follow these steps when using CHG solution during a sponge bath (unless your health care provider gives you different instructions): Use your normal soap and shampoo to wash your face and hair. Pour the CHG onto a clean washcloth. Starting at your neck, lather your body down to your toes. Make sure you follow these instructions: If you will be having surgery, pay special attention to the part of your body where you will be having surgery. Scrub this area for at least 1 minute. Do not use CHG on your head or face. If the solution gets into your ears or eyes, rinse them well with water. Avoid your  genital area. Avoid any areas of skin that have broken skin, cuts, or scrapes. Scrub your back and under your arms. Make sure to wash skin folds. Let the lather sit on your skin for 1-2 minutes or as long as told by your health care provider. Using a  different clean, wet washcloth, thoroughly rinse your entire body. Make sure that all body creases and crevices are rinsed well. Dry off with a clean towel. Do not put any substances on your body afterward--such as powder, lotion, or perfume--unless you are told to do so by your health care provider. Only use lotions that are recommended by the manufacturer. Put on clean clothes or pajamas. If it is the night before your surgery, sleep in clean sheets. How to use CHG prepackaged cloths Only use CHG cloths as told by your health care provider, and follow the instructions on the label. Use the CHG cloth on clean, dry skin. Do not use the CHG cloth on your head or face unless your health care provider tells you to. When washing with the CHG cloth: Avoid your genital area. Avoid any areas of skin that have broken skin, cuts, or scrapes. Before surgery Follow these steps when using a CHG cloth to clean before surgery (unless your health care provider gives you different instructions): Using the CHG cloth, vigorously scrub the part of your body where you will be having surgery. Scrub using a back-and-forth motion for 3 minutes. The area on your body should be completely wet with CHG when you are done scrubbing. Do not rinse. Discard the cloth and let the area air-dry. Do not put any substances on the area afterward, such as powder, lotion, or perfume. Put on clean clothes or pajamas. If it is the night before your surgery, sleep in clean sheets.  For general bathing Follow these steps when using CHG cloths for general bathing (unless your health care provider gives you different instructions). Use a separate CHG cloth for each area of your body. Make sure you wash between any folds of skin and between your fingers and toes. Wash your body in the following order, switching to a new cloth after each step: The front of your neck, shoulders, and chest. Both of your arms, under your arms, and your  hands. Your stomach and groin area, avoiding the genitals. Your right leg and foot. Your left leg and foot. The back of your neck, your back, and your buttocks. Do not rinse. Discard the cloth and let the area air-dry. Do not put any substances on your body afterward--such as powder, lotion, or perfume--unless you are told to do so by your health care provider. Only use lotions that are recommended by the manufacturer. Put on clean clothes or pajamas. Contact a health care provider if: Your skin gets irritated after scrubbing. You have questions about using your solution or cloth. You swallow any chlorhexidine. Call your local poison control center (1-5851024268 in the [place] Get help right away if: Your eyes itch badly, or they become very red or swollen. Your skin itches badly and is red or swollen. Your hearing changes. You have trouble seeing. You have swelling or tingling in your mouth or throat. You have trouble breathing. These symptoms may represent a serious problem that is an emergency. Do not wait to see if the symptoms will go away. Get medical help right away. Call your local emergency services (911 in the [place] Do not drive yourself to the hospital. Summary Chlorhexidine gluconate (CHG) is  a germ-killing (antiseptic) solution that is used to clean the skin. Cleaning your skin with CHG may help to lower your risk for infection. You may be given CHG to use for bathing. It may be in a bottle or in a prepackaged cloth to use on your skin. Carefully follow your health care provider's instructions and the instructions on the product label. Do not use CHG if you have a chlorhexidine allergy. Contact your health care provider if your skin gets irritated after scrubbing. This information is not intended to replace advice given to you by your health care provider. Make sure you discuss any questions you have with your health care provider. Document Revised: 06/13/2021 Document  Reviewed: 04/26/2020 Elsevier Patient Education  [place].

## 2021-10-28 ENCOUNTER — Encounter (HOSPITAL_COMMUNITY)
Admission: RE | Admit: 2021-10-28 | Discharge: 2021-10-28 | Disposition: A | Discharge status: Home / Self Care | Payer: Medicare HMO | Source: Ambulatory Visit | Attending: General Surgery | Admitting: General Surgery

## 2021-10-28 ENCOUNTER — Encounter (HOSPITAL_COMMUNITY): Payer: Self-pay

## 2021-10-28 VITALS — BP 127/85 | HR 84 | Temp 98.3°F | Resp 16 | Ht 71.0 in | Wt 167.5 lb

## 2021-10-28 DIAGNOSIS — Z01818 Encounter for other preprocedural examination: Secondary | ICD-10-CM | POA: Diagnosis present

## 2021-10-28 DIAGNOSIS — R933 Abnormal findings on diagnostic imaging of other parts of digestive tract: Secondary | ICD-10-CM | POA: Diagnosis not present

## 2021-10-28 DIAGNOSIS — D490 Neoplasm of unspecified behavior of digestive system: Secondary | ICD-10-CM | POA: Diagnosis not present

## 2021-10-28 HISTORY — DX: Malignant (primary) neoplasm, unspecified: C80.1

## 2021-10-28 LAB — BASIC METABOLIC PANEL
Anion gap: 5 (ref 5–15)
BUN: 17 mg/dL (ref 8–23)
CO2: 24 mmol/L (ref 22–32)
Calcium: 8.9 mg/dL (ref 8.9–10.3)
Chloride: 108 mmol/L (ref 98–111)
Creatinine, Ser: 0.91 mg/dL (ref 0.61–1.24)
GFR, Estimated: >60 mL/min (ref >60)
Glucose, Bld: 85 mg/dL (ref 70–99)
Potassium: 4 mmol/L (ref 3.5–5.1)
Sodium: 137 mmol/L (ref 135–145)

## 2021-10-28 LAB — CBC WITH DIFFERENTIAL/PLATELET
Abs Immature Granulocytes: 0.03 10*3/uL (ref 0.00–0.07)
Basophils Absolute: 0.1 10*3/uL (ref 0.0–0.1)
Basophils Relative: 1 %
Eosinophils Absolute: 0.7 10*3/uL — ABNORMAL HIGH (ref 0.0–0.5)
Eosinophils Relative: 9 %
HCT: 34.2 % — ABNORMAL LOW (ref 39.0–52.0)
Hemoglobin: 10.6 g/dL — ABNORMAL LOW (ref 13.0–17.0)
Immature Granulocytes: 0 %
Lymphocytes Relative: 21 %
Lymphs Abs: 1.7 10*3/uL (ref 0.7–4.0)
MCH: 24.6 pg — ABNORMAL LOW (ref 26.0–34.0)
MCHC: 31 g/dL (ref 30.0–36.0)
MCV: 79.4 fL — ABNORMAL LOW (ref 80.0–100.0)
Monocytes Absolute: 0.6 10*3/uL (ref 0.1–1.0)
Monocytes Relative: 7 %
Neutro Abs: 4.9 10*3/uL (ref 1.7–7.7)
Neutrophils Relative %: 62 %
Platelets: 401 10*3/uL — ABNORMAL HIGH (ref 150–400)
RBC: 4.31 MIL/uL (ref 4.22–5.81)
RDW: 16.1 % — ABNORMAL HIGH (ref 11.5–15.5)
WBC: 8 10*3/uL (ref 4.0–10.5)
nRBC: 0 % (ref 0.0–0.2)

## 2021-10-28 LAB — HEMOGLOBIN A1C
Hgb A1c MFr Bld: 5.7 % — ABNORMAL HIGH (ref 4.8–5.6)
Mean Plasma Glucose: 116.89 mg/dL

## 2021-10-28 LAB — TYPE AND SCREEN
ABO/RH(D): O POS
Antibody Screen: NEGATIVE

## 2021-11-01 ENCOUNTER — Encounter (HOSPITAL_COMMUNITY): Payer: Medicare HMO | Admitting: Physical Therapy

## 2021-11-02 ENCOUNTER — Encounter (HOSPITAL_COMMUNITY): Payer: Self-pay | Admitting: General Surgery

## 2021-11-02 ENCOUNTER — Inpatient Hospital Stay (HOSPITAL_COMMUNITY): Payer: Medicare HMO | Admitting: Anesthesiology

## 2021-11-02 ENCOUNTER — Inpatient Hospital Stay (HOSPITAL_COMMUNITY)
Admission: RE | Admit: 2021-11-02 | Discharge: 2021-11-05 | DRG: 330 | Disposition: A | Discharge status: Home / Self Care | Payer: Medicare HMO | Attending: General Surgery | Admitting: General Surgery

## 2021-11-02 ENCOUNTER — Other Ambulatory Visit: Payer: Self-pay

## 2021-11-02 ENCOUNTER — Encounter (HOSPITAL_COMMUNITY)
Admission: RE | Disposition: A | Discharge status: Home / Self Care | Payer: Self-pay | Source: Home / Self Care | Attending: General Surgery

## 2021-11-02 DIAGNOSIS — G2581 Restless legs syndrome: Secondary | ICD-10-CM | POA: Diagnosis present

## 2021-11-02 DIAGNOSIS — J45909 Unspecified asthma, uncomplicated: Secondary | ICD-10-CM | POA: Diagnosis not present

## 2021-11-02 DIAGNOSIS — Z882 Allergy status to sulfonamides status: Secondary | ICD-10-CM | POA: Diagnosis not present

## 2021-11-02 DIAGNOSIS — I959 Hypotension, unspecified: Secondary | ICD-10-CM | POA: Diagnosis not present

## 2021-11-02 DIAGNOSIS — Z87891 Personal history of nicotine dependence: Secondary | ICD-10-CM

## 2021-11-02 DIAGNOSIS — D869 Sarcoidosis, unspecified: Secondary | ICD-10-CM | POA: Diagnosis present

## 2021-11-02 DIAGNOSIS — F419 Anxiety disorder, unspecified: Secondary | ICD-10-CM | POA: Diagnosis present

## 2021-11-02 DIAGNOSIS — D490 Neoplasm of unspecified behavior of digestive system: Secondary | ICD-10-CM | POA: Diagnosis not present

## 2021-11-02 DIAGNOSIS — Z79899 Other long term (current) drug therapy: Secondary | ICD-10-CM

## 2021-11-02 DIAGNOSIS — C184 Malignant neoplasm of transverse colon: Secondary | ICD-10-CM | POA: Diagnosis not present

## 2021-11-02 DIAGNOSIS — C183 Malignant neoplasm of hepatic flexure: Secondary | ICD-10-CM

## 2021-11-02 DIAGNOSIS — K5900 Constipation, unspecified: Secondary | ICD-10-CM | POA: Diagnosis present

## 2021-11-02 DIAGNOSIS — E785 Hyperlipidemia, unspecified: Secondary | ICD-10-CM | POA: Diagnosis present

## 2021-11-02 DIAGNOSIS — K648 Other hemorrhoids: Secondary | ICD-10-CM | POA: Diagnosis present

## 2021-11-02 DIAGNOSIS — D62 Acute posthemorrhagic anemia: Secondary | ICD-10-CM | POA: Diagnosis not present

## 2021-11-02 DIAGNOSIS — J449 Chronic obstructive pulmonary disease, unspecified: Secondary | ICD-10-CM | POA: Diagnosis present

## 2021-11-02 DIAGNOSIS — D72829 Elevated white blood cell count, unspecified: Secondary | ICD-10-CM | POA: Diagnosis not present

## 2021-11-02 DIAGNOSIS — I739 Peripheral vascular disease, unspecified: Secondary | ICD-10-CM | POA: Diagnosis present

## 2021-11-02 DIAGNOSIS — F32A Depression, unspecified: Secondary | ICD-10-CM | POA: Diagnosis present

## 2021-11-02 DIAGNOSIS — F418 Other specified anxiety disorders: Secondary | ICD-10-CM | POA: Diagnosis not present

## 2021-11-02 DIAGNOSIS — C189 Malignant neoplasm of colon, unspecified: Secondary | ICD-10-CM | POA: Diagnosis present

## 2021-11-02 HISTORY — PX: LAPAROSCOPIC PARTIAL COLECTOMY: SHX5907

## 2021-11-02 SURGERY — LAPAROSCOPIC PARTIAL COLECTOMY
Anesthesia: General | Site: Abdomen

## 2021-11-02 MED ORDER — ONDANSETRON HCL 4 MG/2ML IJ SOLN: 4.0000 mg | Freq: Four times a day (QID) | INTRAMUSCULAR | Status: DC | PRN

## 2021-11-02 MED ORDER — ORAL CARE MOUTH RINSE: 15.0000 mL | Freq: Once | OROMUCOSAL | Status: AC

## 2021-11-02 MED ORDER — ONDANSETRON HCL 4 MG/2ML IJ SOLN
INTRAMUSCULAR | Status: DC | PRN
  Administered 2021-11-02: 4 mg via INTRAVENOUS

## 2021-11-02 MED ORDER — ONDANSETRON HCL 4 MG/2ML IJ SOLN
INTRAMUSCULAR | Status: AC
  Filled 2021-11-02: qty 2

## 2021-11-02 MED ORDER — SACCHAROMYCES BOULARDII 250 MG PO CAPS
250.0000 mg | ORAL_CAPSULE | Freq: Two times a day (BID) | ORAL | Status: DC
  Administered 2021-11-02 – 2021-11-05 (×7): 250 mg via ORAL
  Filled 2021-11-02 (×7): qty 1

## 2021-11-02 MED ORDER — ALVIMOPAN 12 MG PO CAPS
12.0000 mg | ORAL_CAPSULE | ORAL | Status: AC
  Administered 2021-11-02: 12 mg via ORAL

## 2021-11-02 MED ORDER — MORPHINE SULFATE (PF) 2 MG/ML IV SOLN
2.0000 mg | INTRAVENOUS | Status: DC | PRN
  Administered 2021-11-02 – 2021-11-03 (×3): 2 mg via INTRAVENOUS
  Filled 2021-11-02 (×3): qty 1

## 2021-11-02 MED ORDER — BUPIVACAINE LIPOSOME 1.3 % IJ SUSP
INTRAMUSCULAR | Status: DC | PRN
  Administered 2021-11-02: 20 mL

## 2021-11-02 MED ORDER — ALVIMOPAN 12 MG PO CAPS
12.0000 mg | ORAL_CAPSULE | Freq: Two times a day (BID) | ORAL | Status: AC
  Administered 2021-11-03 (×2): 12 mg via ORAL
  Filled 2021-11-02 (×2): qty 1

## 2021-11-02 MED ORDER — TIZANIDINE HCL 4 MG PO TABS
4.0000 mg | ORAL_TABLET | Freq: Every day | ORAL | Status: DC
  Administered 2021-11-03 – 2021-11-05 (×3): 4 mg via ORAL
  Filled 2021-11-02 (×3): qty 1

## 2021-11-02 MED ORDER — PROPOFOL 10 MG/ML IV BOLUS
INTRAVENOUS | Status: DC | PRN
  Administered 2021-11-02: 150 mg via INTRAVENOUS
  Administered 2021-11-02: 50 mg via INTRAVENOUS

## 2021-11-02 MED ORDER — PHENYLEPHRINE HCL (PRESSORS) 10 MG/ML IV SOLN
INTRAVENOUS | Status: AC
  Filled 2021-11-02: qty 1

## 2021-11-02 MED ORDER — DULOXETINE HCL 60 MG PO CPEP
60.0000 mg | ORAL_CAPSULE | Freq: Every day | ORAL | Status: DC
  Administered 2021-11-03 – 2021-11-05 (×3): 60 mg via ORAL
  Filled 2021-11-02 (×3): qty 1

## 2021-11-02 MED ORDER — HEPARIN SODIUM (PORCINE) 5000 UNIT/ML IJ SOLN
INTRAMUSCULAR | Status: AC
  Filled 2021-11-02: qty 1

## 2021-11-02 MED ORDER — LIDOCAINE HCL (PF) 2 % IJ SOLN
INTRAMUSCULAR | Status: AC
  Filled 2021-11-02: qty 5

## 2021-11-02 MED ORDER — PHENYLEPHRINE 80 MCG/ML (10ML) SYRINGE FOR IV PUSH (FOR BLOOD PRESSURE SUPPORT)
PREFILLED_SYRINGE | INTRAVENOUS | Status: AC
  Filled 2021-11-02: qty 10

## 2021-11-02 MED ORDER — ADULT MULTIVITAMIN W/MINERALS CH
1.0000 | ORAL_TABLET | Freq: Every day | ORAL | Status: DC
Start: 2021-11-03 — End: 2021-11-05
  Administered 2021-11-03 – 2021-11-05 (×3): 1 via ORAL
  Filled 2021-11-02 (×3): qty 1

## 2021-11-02 MED ORDER — SODIUM CHLORIDE 0.9 % IV SOLN
INTRAVENOUS | Status: AC
  Administered 2021-11-03: 2 g via INTRAVENOUS
  Filled 2021-11-02: qty 2

## 2021-11-02 MED ORDER — ALUM & MAG HYDROXIDE-SIMETH 200-200-20 MG/5ML PO SUSP: 30.0000 mL | Freq: Four times a day (QID) | ORAL | Status: DC | PRN

## 2021-11-02 MED ORDER — SEVOFLURANE IN SOLN
RESPIRATORY_TRACT | Status: AC
  Filled 2021-11-02: qty 250

## 2021-11-02 MED ORDER — CHLORHEXIDINE GLUCONATE 0.12 % MT SOLN
15.0000 mL | Freq: Once | OROMUCOSAL | Status: AC
  Administered 2021-11-02: 15 mL via OROMUCOSAL

## 2021-11-02 MED ORDER — BUSPIRONE HCL 5 MG PO TABS
10.0000 mg | ORAL_TABLET | Freq: Three times a day (TID) | ORAL | Status: DC
  Administered 2021-11-02 – 2021-11-05 (×9): 10 mg via ORAL
  Filled 2021-11-02 (×9): qty 2

## 2021-11-02 MED ORDER — SUGAMMADEX SODIUM 200 MG/2ML IV SOLN
INTRAVENOUS | Status: DC | PRN
  Administered 2021-11-02: 152 mg via INTRAVENOUS

## 2021-11-02 MED ORDER — PROPOFOL 10 MG/ML IV BOLUS
INTRAVENOUS | Status: AC
  Filled 2021-11-02: qty 20

## 2021-11-02 MED ORDER — MOMETASONE FURO-FORMOTEROL FUM 200-5 MCG/ACT IN AERO
2.0000 | INHALATION_SPRAY | Freq: Two times a day (BID) | RESPIRATORY_TRACT | Status: DC
  Administered 2021-11-02 – 2021-11-05 (×6): 2 via RESPIRATORY_TRACT
  Filled 2021-11-02: qty 8.8

## 2021-11-02 MED ORDER — ROCURONIUM BROMIDE 10 MG/ML (PF) SYRINGE
PREFILLED_SYRINGE | INTRAVENOUS | Status: DC | PRN
  Administered 2021-11-02: 60 mg via INTRAVENOUS
  Administered 2021-11-02: 40 mg via INTRAVENOUS

## 2021-11-02 MED ORDER — HYDROXYZINE HCL 25 MG PO TABS
50.0000 mg | ORAL_TABLET | Freq: Every day | ORAL | Status: DC
  Administered 2021-11-02 – 2021-11-04 (×3): 50 mg via ORAL
  Filled 2021-11-02 (×3): qty 2

## 2021-11-02 MED ORDER — ENSURE PRE-SURGERY PO LIQD: 296.0000 mL | Freq: Once | ORAL | Status: DC

## 2021-11-02 MED ORDER — DEXAMETHASONE SODIUM PHOSPHATE 10 MG/ML IJ SOLN
INTRAMUSCULAR | Status: DC | PRN
  Administered 2021-11-02: 10 mg via INTRAVENOUS

## 2021-11-02 MED ORDER — METOPROLOL TARTRATE 5 MG/5ML IV SOLN: 5.0000 mg | Freq: Four times a day (QID) | INTRAVENOUS | Status: DC | PRN

## 2021-11-02 MED ORDER — ALPRAZOLAM 0.5 MG PO TABS
0.5000 mg | ORAL_TABLET | Freq: Every day | ORAL | Status: DC
  Administered 2021-11-02 – 2021-11-04 (×3): 0.5 mg via ORAL
  Filled 2021-11-02 (×3): qty 1

## 2021-11-02 MED ORDER — HEPARIN SODIUM (PORCINE) 5000 UNIT/ML IJ SOLN
5000.0000 [IU] | Freq: Three times a day (TID) | INTRAMUSCULAR | Status: DC
  Administered 2021-11-03 – 2021-11-04 (×4): 5000 [IU] via SUBCUTANEOUS
  Filled 2021-11-02 (×5): qty 1

## 2021-11-02 MED ORDER — ACETAMINOPHEN 500 MG PO TABS
1000.0000 mg | ORAL_TABLET | ORAL | Status: AC
  Administered 2021-11-02: 1000 mg via ORAL

## 2021-11-02 MED ORDER — ENSURE PRE-SURGERY PO LIQD
592.0000 mL | Freq: Once | ORAL | Status: DC
  Administered 2021-11-02: 592 mL via ORAL

## 2021-11-02 MED ORDER — HEPARIN SODIUM (PORCINE) 5000 UNIT/ML IJ SOLN
5000.0000 [IU] | Freq: Once | INTRAMUSCULAR | Status: AC
  Administered 2021-11-02: 5000 [IU] via SUBCUTANEOUS

## 2021-11-02 MED ORDER — GABAPENTIN 300 MG PO CAPS
300.0000 mg | ORAL_CAPSULE | Freq: Two times a day (BID) | ORAL | Status: DC
  Administered 2021-11-02 – 2021-11-05 (×7): 300 mg via ORAL
  Filled 2021-11-02 (×7): qty 1

## 2021-11-02 MED ORDER — DIPHENHYDRAMINE HCL 12.5 MG/5ML PO ELIX: 12.5000 mg | ORAL_SOLUTION | Freq: Four times a day (QID) | ORAL | Status: DC | PRN

## 2021-11-02 MED ORDER — PROCHLORPERAZINE MALEATE 10 MG PO TABS: 10.0000 mg | ORAL_TABLET | Freq: Four times a day (QID) | ORAL | Status: DC | PRN

## 2021-11-02 MED ORDER — SODIUM CHLORIDE 0.9 % IV SOLN
2.0000 g | Freq: Two times a day (BID) | INTRAVENOUS | Status: AC
  Administered 2021-11-02: 2 g via INTRAVENOUS
  Filled 2021-11-02 (×3): qty 2

## 2021-11-02 MED ORDER — LIDOCAINE 2% (20 MG/ML) 5 ML SYRINGE
INTRAMUSCULAR | Status: DC | PRN
  Administered 2021-11-02: 60 mg via INTRAVENOUS

## 2021-11-02 MED ORDER — ONDANSETRON HCL 4 MG PO TABS: 4.0000 mg | ORAL_TABLET | Freq: Four times a day (QID) | ORAL | Status: DC | PRN

## 2021-11-02 MED ORDER — SIMETHICONE 80 MG PO CHEW
40.0000 mg | CHEWABLE_TABLET | Freq: Four times a day (QID) | ORAL | Status: DC | PRN
  Administered 2021-11-03: 40 mg via ORAL
  Filled 2021-11-02: qty 1

## 2021-11-02 MED ORDER — 0.9 % SODIUM CHLORIDE (POUR BTL) OPTIME
TOPICAL | Status: DC | PRN
  Administered 2021-11-02 (×4): 1000 mL

## 2021-11-02 MED ORDER — DIPHENHYDRAMINE HCL 50 MG/ML IJ SOLN: 12.5000 mg | Freq: Four times a day (QID) | INTRAMUSCULAR | Status: DC | PRN

## 2021-11-02 MED ORDER — ENSURE SURGERY PO LIQD
237.0000 mL | Freq: Two times a day (BID) | ORAL | Status: DC
  Administered 2021-11-03 – 2021-11-04 (×4): 237 mL via ORAL

## 2021-11-02 MED ORDER — ROCURONIUM BROMIDE 10 MG/ML (PF) SYRINGE
PREFILLED_SYRINGE | INTRAVENOUS | Status: AC
  Filled 2021-11-02: qty 10

## 2021-11-02 MED ORDER — DEXAMETHASONE SODIUM PHOSPHATE 10 MG/ML IJ SOLN
INTRAMUSCULAR | Status: AC
  Filled 2021-11-02: qty 1

## 2021-11-02 MED ORDER — HEMOSTATIC AGENTS (NO CHARGE) OPTIME
TOPICAL | Status: DC | PRN
  Administered 2021-11-02: 1 via TOPICAL

## 2021-11-02 MED ORDER — ACETAMINOPHEN 500 MG PO TABS
ORAL_TABLET | ORAL | Status: AC
  Filled 2021-11-02: qty 2

## 2021-11-02 MED ORDER — ALBUTEROL SULFATE (2.5 MG/3ML) 0.083% IN NEBU
2.5000 mg | INHALATION_SOLUTION | Freq: Two times a day (BID) | RESPIRATORY_TRACT | Status: DC
  Administered 2021-11-03 – 2021-11-05 (×5): 2.5 mg via RESPIRATORY_TRACT
  Filled 2021-11-02 (×5): qty 3

## 2021-11-02 MED ORDER — ALVIMOPAN 12 MG PO CAPS
ORAL_CAPSULE | ORAL | Status: AC
  Filled 2021-11-02: qty 1

## 2021-11-02 MED ORDER — HYDROMORPHONE HCL 1 MG/ML IJ SOLN: 0.2500 mg | INTRAMUSCULAR | Status: DC | PRN

## 2021-11-02 MED ORDER — ACETAMINOPHEN 500 MG PO TABS
1000.0000 mg | ORAL_TABLET | Freq: Four times a day (QID) | ORAL | Status: DC
  Administered 2021-11-02 – 2021-11-05 (×12): 1000 mg via ORAL
  Filled 2021-11-02 (×12): qty 2

## 2021-11-02 MED ORDER — FENTANYL CITRATE (PF) 250 MCG/5ML IJ SOLN
INTRAMUSCULAR | Status: DC | PRN
  Administered 2021-11-02 (×2): 100 ug via INTRAVENOUS
  Administered 2021-11-02 (×2): 25 ug via INTRAVENOUS
  Administered 2021-11-02: 50 ug via INTRAVENOUS
  Administered 2021-11-02 (×2): 25 ug via INTRAVENOUS

## 2021-11-02 MED ORDER — BOOST / RESOURCE BREEZE PO LIQD CUSTOM
1.0000 | Freq: Three times a day (TID) | ORAL | Status: DC
  Administered 2021-11-02 – 2021-11-05 (×7): 1 via ORAL

## 2021-11-02 MED ORDER — LACTATED RINGERS IV SOLN
INTRAVENOUS | Status: DC
  Administered 2021-11-02: 1000 mL via INTRAVENOUS

## 2021-11-02 MED ORDER — FENTANYL CITRATE (PF) 250 MCG/5ML IJ SOLN
INTRAMUSCULAR | Status: AC
  Filled 2021-11-02: qty 5

## 2021-11-02 MED ORDER — LACTATED RINGERS IV SOLN: INTRAVENOUS | Status: DC

## 2021-11-02 MED ORDER — ONDANSETRON HCL 4 MG/2ML IJ SOLN: 4.0000 mg | Freq: Once | INTRAMUSCULAR | Status: DC | PRN

## 2021-11-02 MED ORDER — PHENYLEPHRINE HCL (PRESSORS) 10 MG/ML IV SOLN
INTRAVENOUS | Status: DC | PRN
  Administered 2021-11-02: 160 ug via INTRAVENOUS
  Administered 2021-11-02 (×2): 80 ug via INTRAVENOUS
  Administered 2021-11-02: 160 ug via INTRAVENOUS
  Administered 2021-11-02 (×5): 80 ug via INTRAVENOUS

## 2021-11-02 MED ORDER — PROCHLORPERAZINE EDISYLATE 10 MG/2ML IJ SOLN: 5.0000 mg | Freq: Four times a day (QID) | INTRAMUSCULAR | Status: DC | PRN

## 2021-11-02 MED ORDER — ATORVASTATIN CALCIUM 40 MG PO TABS
40.0000 mg | ORAL_TABLET | Freq: Every day | ORAL | Status: DC
  Administered 2021-11-02 – 2021-11-04 (×3): 40 mg via ORAL
  Filled 2021-11-02 (×3): qty 1

## 2021-11-02 MED ORDER — CHLORHEXIDINE GLUCONATE CLOTH 2 % EX PADS
6.0000 | MEDICATED_PAD | Freq: Once | CUTANEOUS | Status: DC
  Administered 2021-11-02: 6 via TOPICAL

## 2021-11-02 MED ORDER — ALBUTEROL SULFATE (2.5 MG/3ML) 0.083% IN NEBU
2.5000 mg | INHALATION_SOLUTION | Freq: Two times a day (BID) | RESPIRATORY_TRACT | Status: DC
Start: 2021-11-02 — End: 2021-11-02
  Administered 2021-11-02: 2.5 mg via RESPIRATORY_TRACT
  Filled 2021-11-02: qty 3

## 2021-11-02 MED ORDER — BUPIVACAINE LIPOSOME 1.3 % IJ SUSP
INTRAMUSCULAR | Status: AC
  Filled 2021-11-02: qty 20

## 2021-11-02 MED ORDER — DICLOFENAC SODIUM 75 MG PO TBEC
75.0000 mg | DELAYED_RELEASE_TABLET | Freq: Two times a day (BID) | ORAL | Status: DC
  Administered 2021-11-02 – 2021-11-05 (×7): 75 mg via ORAL
  Filled 2021-11-02 (×7): qty 1

## 2021-11-02 MED ORDER — CEFOTETAN DISODIUM 2 G IJ SOLR
2.0000 g | INTRAMUSCULAR | Status: AC
Start: 2021-11-02 — End: 2021-11-02
  Administered 2021-11-02: 2 g via INTRAVENOUS

## 2021-11-02 MED ORDER — OXYCODONE HCL 5 MG PO TABS
5.0000 mg | ORAL_TABLET | ORAL | Status: DC | PRN
  Administered 2021-11-02: 10 mg via ORAL
  Administered 2021-11-02 – 2021-11-03 (×2): 5 mg via ORAL
  Filled 2021-11-02: qty 1
  Filled 2021-11-02 (×2): qty 2

## 2021-11-02 SURGICAL SUPPLY — 58 items
APPLIER CLIP 13 LRG OPEN (CLIP) ×2
APR CLP LRG 13 20 CLIP (CLIP) ×2
BAG HAMPER (MISCELLANEOUS) ×1 IMPLANT
CELLS DAT CNTRL 66122 CELL SVR (MISCELLANEOUS) ×1 IMPLANT
CLIP APPLIE 13 LRG OPEN (CLIP) IMPLANT
COVER LIGHT HANDLE STERIS (MISCELLANEOUS) ×2 IMPLANT
DRSG OPSITE POSTOP 4X8 (GAUZE/BANDAGES/DRESSINGS) IMPLANT
DRSG TEGADERM 2-3/8X2-3/4 SM (GAUZE/BANDAGES/DRESSINGS) ×3 IMPLANT
ELECT REM PT RETURN 9FT ADLT (ELECTROSURGICAL) ×1
ELECTRODE REM PT RTRN 9FT ADLT (ELECTROSURGICAL) ×1 IMPLANT
GAUZE 4X4 16PLY ~~LOC~~+RFID DBL (SPONGE) IMPLANT
GLOVE BIO SURGEON STRL SZ 6.5 (GLOVE) ×2 IMPLANT
GLOVE BIOGEL PI IND STRL 6.5 (GLOVE) ×2 IMPLANT
GLOVE BIOGEL PI IND STRL 7.0 (GLOVE) ×5 IMPLANT
GLOVE SURG SS PI 7.5 STRL IVOR (GLOVE) IMPLANT
GOWN STRL REUS W/TWL LRG LVL3 (GOWN DISPOSABLE) ×6 IMPLANT
INST SET LAPROSCOPIC AP (KITS) ×1 IMPLANT
INST SET MAJOR GENERAL (KITS) ×1 IMPLANT
KIT TURNOVER CYSTO (KITS) ×1 IMPLANT
LIGASURE IMPACT 36 18CM CVD LR (INSTRUMENTS) IMPLANT
LIGASURE LAP ATLAS 10MM 37CM (INSTRUMENTS) ×1 IMPLANT
MANIFOLD NEPTUNE II (INSTRUMENTS) ×1 IMPLANT
NDL HYPO 21X1.5 SAFETY (NEEDLE) ×1 IMPLANT
NDL INSUFFLATION 14GA 120MM (NEEDLE) ×1 IMPLANT
NEEDLE HYPO 21X1.5 SAFETY (NEEDLE) ×1 IMPLANT
NEEDLE INSUFFLATION 14GA 120MM (NEEDLE) ×1 IMPLANT
NS IRRIG 1000ML POUR BTL (IV SOLUTION) ×3 IMPLANT
PACK COLON (CUSTOM PROCEDURE TRAY) ×1 IMPLANT
PAD ARMBOARD 7.5X6 YLW CONV (MISCELLANEOUS) ×1 IMPLANT
PENCIL HANDSWITCHING (ELECTRODE) IMPLANT
PENCIL SMOKE EVACUATOR COATED (MISCELLANEOUS) ×1 IMPLANT
POWDER SURGICEL 3.0 GRAM (HEMOSTASIS) IMPLANT
RELOAD PROXIMATE 75MM BLUE (ENDOMECHANICALS) ×3 IMPLANT
RELOAD STAPLE 75 3.8 BLU REG (ENDOMECHANICALS) IMPLANT
RETRACTOR WND ALEXIS 18 MED (MISCELLANEOUS) IMPLANT
RTRCTR WOUND ALEXIS 18CM MED (MISCELLANEOUS) ×1
SET TUBE SMOKE EVAC HIGH FLOW (TUBING) ×1 IMPLANT
SHEET LAVH (DRAPES) ×1 IMPLANT
SLEEVE Z-THREAD 5X100MM (TROCAR) IMPLANT
SPONGE GAUZE 2X2 8PLY STRL LF (GAUZE/BANDAGES/DRESSINGS) ×3 IMPLANT
SPONGE SURGIFOAM ABS GEL 100 (HEMOSTASIS) IMPLANT
SPONGE T-LAP 18X18 ~~LOC~~+RFID (SPONGE) ×2 IMPLANT
STAPLER GUN LINEAR PROX 60 (STAPLE) ×1 IMPLANT
STAPLER PROXIMATE 75MM BLUE (STAPLE) IMPLANT
STAPLER VISISTAT (STAPLE) ×1 IMPLANT
SUT CHROMIC 3 0 SH 27 (SUTURE) IMPLANT
SUT PDS AB CT VIOLET #0 27IN (SUTURE) ×2 IMPLANT
SUT SILK 3 0 SH CR/8 (SUTURE) ×1 IMPLANT
SUT VIC AB 0 CT1 27 (SUTURE) ×1
SUT VIC AB 0 CT1 27XBRD ANTBC (SUTURE) IMPLANT
SYR 10ML LL (SYRINGE) ×1 IMPLANT
SYR 20ML LL LF (SYRINGE) ×2 IMPLANT
TRAY FOLEY MTR SLVR 16FR STAT (SET/KITS/TRAYS/PACK) ×1 IMPLANT
TROCAR Z-THRD FIOS HNDL 11X100 (TROCAR) ×1 IMPLANT
TROCAR Z-THREAD FIOS 5X100MM (TROCAR) ×1 IMPLANT
TROCAR Z-THREAD SLEEVE 11X100 (TROCAR) ×1 IMPLANT
WARMER LAPAROSCOPE (MISCELLANEOUS) ×1 IMPLANT
YANKAUER SUCT BULB TIP 10FT TU (MISCELLANEOUS) ×2 IMPLANT

## 2021-11-02 NOTE — Anesthesia Postprocedure Evaluation (Signed)
Anesthesia Post Note  Patient: Albert Gonzalez  Procedure(s) Performed: LAPAROSCOPIC EXTENDED RIGHT HEMICOLECTOMY (Abdomen)  Patient location during evaluation: Phase II Anesthesia Type: General Level of consciousness: awake and alert and oriented Pain management: pain level controlled Vital Signs Assessment: post-procedure vital signs reviewed and stable Respiratory status: spontaneous breathing, nonlabored ventilation and respiratory function stable Cardiovascular status: blood pressure returned to baseline and stable Postop Assessment: no apparent nausea or vomiting Anesthetic complications: no   No notable events documented.   Last Vitals:  Vitals:   11/02/21 1200 11/02/21 1226  BP: 132/88 (!) 142/85  Pulse: 90 91  Resp: 14 18  Temp:  36.8 C  SpO2: 96% 94%    Last Pain:  Vitals:   11/02/21 1226  TempSrc: Oral  PainSc:                  Chelci [place] C Xee Hollman

## 2021-11-02 NOTE — Progress Notes (Signed)
[facility]  Laparoscopic Extended right hemicolectomy with extracorporeal anastomosis.   Update wife.   ERAS protocol Tylenol for pain, home diflofenac oral  PRN narcotics Binder Ambulate Clear diet Foley out, monitor urine output LR @ 50 Labs in AM Blood loss about 200cc SCDs, heparin tomorrow   Curlene Labrum, MD [facility] [address] [place], [place] [id number] [phone] (office)

## 2021-11-02 NOTE — Interval H&P Note (Signed)
History and Physical Interval Note:  11/02/2021 7:21 AM  Albert Gonzalez  has presented today for surgery, with the diagnosis of Colon Cancer.  The various methods of treatment have been discussed with the patient and family. After consideration of risks, benefits and other options for treatment, the patient has consented to  Procedure(s): LAPAROSCOPIC PARTIAL COLECTOMY (N/A) as a surgical intervention.  The patient's history has been reviewed, patient examined, no change in status, stable for surgery.  I have reviewed the patient's chart and labs.  Questions were answered to the patient's satisfaction.    Prep completed. Tattoo distal on colonoscopy note.   Virl Cagey

## 2021-11-02 NOTE — Transfer of Care (Signed)
Immediate Anesthesia Transfer of Care Note  Patient: Albert Gonzalez  Procedure(s) Performed: LAPAROSCOPIC EXTENDED RIGHT HEMICOLECTOMY (Abdomen)  Patient Location: PACU  Anesthesia Type:General  Level of Consciousness: awake, alert , oriented and patient cooperative  Airway & Oxygen Therapy: Patient Spontanous Breathing and Patient connected to face mask oxygen  Post-op Assessment: Report given to RN, Post -op Vital signs reviewed and stable, Patient moving all extremities X 4 and Patient able to stick tongue midline  Post vital signs: Reviewed  Last Vitals:  Vitals Value Taken Time  BP 151/91 11/02/21 1053  Temp 98.9   Pulse 84 11/02/21 1055  Resp 15 11/02/21 1055  SpO2 100 % 11/02/21 1055  Vitals shown include unvalidated device data.  Last Pain:  Vitals:   11/02/21 0652  TempSrc: Oral  PainSc: 0-No pain      Patients Stated Pain Goal: 6 (76/81/15 7262)  Complications: No notable events documented.

## 2021-11-02 NOTE — Anesthesia Preprocedure Evaluation (Signed)
Anesthesia Evaluation  Patient identified by MRN, date of birth, ID band Patient awake    Reviewed: Allergy & Precautions, H&P , NPO status , Patient's Chart, lab work & pertinent test results  Airway Mallampati: II  TM Distance: >3 FB Neck ROM: Full  Mouth opening: Limited Mouth Opening  Dental  (+) Dental Advisory Given, Teeth Intact   Pulmonary asthma , pneumonia, neg COPD, former smoker,  Sarcoidosis     Pulmonary exam normal breath sounds clear to auscultation       Cardiovascular Exercise Tolerance: Good + Peripheral Vascular Disease (AAA - 4.5 CM)  Normal cardiovascular exam Rhythm:Regular Rate:Normal  09-Sep-2020 09:21:22 [facility] ROUTINE RECORD Jul 20, 1950 (39 yr) Male Caucasian Vent. rate 83 BPM PR interval 158 ms QRS duration 95 ms QT/QTcB 341/401 ms P-R-T axes '29 26 31 '$ Sinus rhythm Confirmed by Fredia Sorrow [phone]) on 09/09/2020 9:49:36 AM   Neuro/Psych PSYCHIATRIC DISORDERS Anxiety Depression negative neurological ROS     GI/Hepatic Neg liver ROS, Bowel prep,  Endo/Other  negative endocrine ROS  Renal/GU negative Renal ROS  negative genitourinary   Musculoskeletal  (+) Arthritis , Osteoarthritis,    Abdominal   Peds negative pediatric ROS (+)  Hematology negative hematology ROS (+)   Anesthesia Other Findings Raynaud's phenomenon   Reproductive/Obstetrics negative OB ROS                            Anesthesia Physical  Anesthesia Plan  ASA: 3  Anesthesia Plan: General   Post-op Pain Management: Dilaudid IV   Induction: Intravenous  PONV Risk Score and Plan: Ondansetron and Dexamethasone  Airway Management Planned: Oral ETT and Video Laryngoscope Planned  Additional Equipment:   Intra-op Plan:   Post-operative Plan: Extubation in OR  Informed Consent: I have reviewed the patients History and Physical, chart, labs and discussed  the procedure including the risks, benefits and alternatives for the proposed anesthesia with the patient or authorized representative who has indicated his/her understanding and acceptance.     Dental advisory given  Plan Discussed with: CRNA and Surgeon  Anesthesia Plan Comments: (Possible GA with airway was discussed.)       Anesthesia Quick Evaluation

## 2021-11-02 NOTE — Op Note (Signed)
[facility] Operative Note  11/02/21  Preoperative Diagnosis: Hepatic flexure colon cancer    Postoperative Diagnosis: Same   Procedure(s) Performed:  Laparoscopic extended right hemicolectomy, extracorporeal anastomosis    Surgeon: Lanell Matar. Constance Haw, MD   Assistants: Aviva Signs, MD     Anesthesia: General endotracheal   Anesthesiologist: Denese Killings, MD    Specimens: Right colon   Estimated Blood Loss: 200cc    Blood Replacement: None    Complications: None   Wound Class: Clean contaminated    Operative Indications: Mr. Kanaan is a 71 yo with a newly diagnosed hepatic flexure cancer who was referred for resection. We discussed laparoscopic partial colectomy versus open, risk of bleeding, infection, injury to other organs, injury to ureter, anastomotic leak and need for bowel preparation and post operative course.   Findings: Redundant colon with long mesentery    Procedure: The patient was taken to the operating room and placed supine with is left arm tucked. General endotracheal anesthesia was induced. Intravenous antibiotics were administered per protocol.  An orogastric tube was attempted but was unsuccessful after multiple attempts. A foley was placed. The abdomen was prepared and draped in the usual sterile fashion.   A vertical infraumbilical incision was made and a towel clip was used to elevate the fascia. A Veress needle and saline drop test confirmed intraperitoneal placement and insufflation was started.  A 11 mm trocar was placed under direct visualization and no injury was noted under the entry site. The patient was noted to have splatter of ink in the right upper quadrant but no other signs of implants or areas on the liver. He had a large amount of omentum. An additional 5 mm trocar was placed in the suprapubic region under direct visualization and a 11 mm trocar was placed in the left lower quadrant under direct visualization. My  assistant placed 5 mm trocar in the right lateral abdominal region for assisting with retraction.   The patient was placed head down with the right side up, and starting at the cecum and terminal ileum the white line of Toldt was taken down with hook cautery. He had a very redundant cecum and ascending colon as well as transverse colon. The tattoo was more on the proximal transverse colon that hepatic flexure. My assistant helped with retracting the hepatic flexure and a ligasure was used to get into the space between the stomach and omentum. He had a large and thick omentum and this was difficult but we proceed to connect the lateral to medial mobilization of the white line of Toldt with the medial to lateral mobilization, taking down the hepatic flexure. The duodenum was noted and was very prominent and lateral, it was noted and protected. With gentle sweeping motion the medial to lateral mobilization was completed and the hepatic flexure was taken down further after the patient was placed in head up position to allow for the colon to come out of the right upper quadrant.  From here there appeared to be adequate mobilization for an extracorporeal anastomosis.   The insufflation was released and the port site in the midline was extended superiorly around the umbilicus and a small wound protector was placed on the extraction site.   The cecum and terminal ileum were eviscerated and the point of proximal transection the terminal ileum was taken with a 75 mm linear cutting stapler. The mesentery was taken with a ligasure. The colon as very redundant with a floppy and redundnat cecum and long  mesentery. We could not get the remaining colon eviscerated due to the shear size of the colon.  From here I had to extend the extraction site about 1cm superior and secured the wound protector down further.  This allowed for evisceration of the transverse colon.  The distal point of transection was determined and a 75 mm  linear cutting stapler was used to take the colon. A ligasure was used to take additional omentum adhesions down separating the stomach and the colon and an additional hepatic attachments down.   An area started to bleed under the stomach and a right angle was used to control it until I was better able to delineated out what was bleeding. The remaining attachments to the colon and mesentery were taken with a Ligasure. The duodenum was noted and protected. The mesentery was taken far enough down for an adequate node harvest and the mesentery was noted to be very long.   From here once the specimen was removed, I could see that the area behind the stomach was bleeding was just anterior to the duodenum and overlying the pancreas and was consistent with right gastroepiploic vein. This was controlled with clips. The right angle was removed. Irrigation was performed in the right side of the abdomen and hemostasis was confirmed. Then Surgicel Powder was placed into the area that had been bleeding.  The right ureter was identified and was in the retroperitoneum undisturbed.   The two ends of the bowel was placed in alignment and a side to side anastomosis was performed using a 75 mm linear cutting stapler on the antimesenteric side of the bowel. The common channel was closed with TA 60. The crotch of the staple line was closed with 3-0 Silk X 3 and the staple line was oversewn with 3-0 Silk lembert suture.  The mesenteric defect was closed with a few interrupted 3-0 Chromic gut.   The bowel was placed back into the abdomen with no twisting.    The entire team then changed gown and gloves and new drapes and equipment were used for closing. The extraction site was closed with 0 PDS in the standard fashion. The 62m trocars were closed with 0 Vicryl. Irrigation of the incisions was performed. The skin was closed with staples.   Dr. JArnoldo Moralewas assisting throughout the procedure for the dissection and controlling of  the bleeding, being present for the critical portions of the case.   Final inspection revealed acceptable hemostasis. All counts were correct at the end of the case. The patient was awakened from anesthesia and extubated without complication.  The patient went to the PACU in stable condition.   LCurlene Labrum MD [facility] [phone](office)

## 2021-11-02 NOTE — Anesthesia Procedure Notes (Signed)
Procedure Name: Intubation Date/Time: 11/02/2021 7:54 AM  Performed by: Maude Leriche, CRNAPre-anesthesia Checklist: Patient identified, Emergency Drugs available, Suction available and Patient being monitored Patient Re-evaluated:Patient Re-evaluated prior to induction Oxygen Delivery Method: Circle system utilized Preoxygenation: Pre-oxygenation with 100% oxygen Induction Type: IV induction Ventilation: Mask ventilation without difficulty Laryngoscope Size: Miller and 2 Grade View: Grade II Tube type: Oral Tube size: 7.5 mm Number of attempts: 1 Airway Equipment and Method: Stylet and Oral airway Placement Confirmation: ETT inserted through vocal cords under direct vision, positive ETCO2 and breath sounds checked- equal and bilateral Secured at: 23 cm Tube secured with: Tape Dental Injury: Teeth and Oropharynx as per pre-operative assessment  Difficulty Due To: Difficult Airway- due to anterior larynx Comments: Grade IIb view w/BURP maneuver

## 2021-11-02 NOTE — Progress Notes (Signed)
Patient arrived to floor via stretcher alert and oriented able to coice needs, medicated for pain 8/10 with effectiveness noted within 15 minutes. Surgery site assessed, staff introduced, call bell given able to operate, patient resting quietly with eyes closed.

## 2021-11-03 ENCOUNTER — Encounter (HOSPITAL_COMMUNITY): Payer: Medicare HMO | Admitting: Physical Therapy

## 2021-11-03 LAB — HEMOGLOBIN AND HEMATOCRIT, BLOOD
HCT: 23.7 % — ABNORMAL LOW (ref 39.0–52.0)
Hemoglobin: 7.1 g/dL — ABNORMAL LOW (ref 13.0–17.0)

## 2021-11-03 LAB — BASIC METABOLIC PANEL
Anion gap: 5 (ref 5–15)
BUN: 9 mg/dL (ref 8–23)
CO2: 27 mmol/L (ref 22–32)
Calcium: 8.6 mg/dL — ABNORMAL LOW (ref 8.9–10.3)
Chloride: 109 mmol/L (ref 98–111)
Creatinine, Ser: 0.88 mg/dL (ref 0.61–1.24)
GFR, Estimated: >60 mL/min (ref >60)
Glucose, Bld: 140 mg/dL — ABNORMAL HIGH (ref 70–99)
Potassium: 4.4 mmol/L (ref 3.5–5.1)
Sodium: 141 mmol/L (ref 135–145)

## 2021-11-03 LAB — CBC WITH DIFFERENTIAL/PLATELET
Abs Immature Granulocytes: 0.08 10*3/uL — ABNORMAL HIGH (ref 0.00–0.07)
Basophils Absolute: 0 10*3/uL (ref 0.0–0.1)
Basophils Relative: 0 %
Eosinophils Absolute: 0 10*3/uL (ref 0.0–0.5)
Eosinophils Relative: 0 %
HCT: 25.5 % — ABNORMAL LOW (ref 39.0–52.0)
Hemoglobin: 7.6 g/dL — ABNORMAL LOW (ref 13.0–17.0)
Immature Granulocytes: 1 %
Lymphocytes Relative: 6 %
Lymphs Abs: 0.8 10*3/uL (ref 0.7–4.0)
MCH: 24.1 pg — ABNORMAL LOW (ref 26.0–34.0)
MCHC: 29.8 g/dL — ABNORMAL LOW (ref 30.0–36.0)
MCV: 81 fL (ref 80.0–100.0)
Monocytes Absolute: 1.1 10*3/uL — ABNORMAL HIGH (ref 0.1–1.0)
Monocytes Relative: 7 %
Neutro Abs: 13.3 10*3/uL — ABNORMAL HIGH (ref 1.7–7.7)
Neutrophils Relative %: 86 %
Platelets: 306 10*3/uL (ref 150–400)
RBC: 3.15 MIL/uL — ABNORMAL LOW (ref 4.22–5.81)
RDW: 15.9 % — ABNORMAL HIGH (ref 11.5–15.5)
WBC: 15.4 10*3/uL — ABNORMAL HIGH (ref 4.0–10.5)
nRBC: 0 % (ref 0.0–0.2)

## 2021-11-03 LAB — PHOSPHORUS: Phosphorus: 3.4 mg/dL (ref 2.5–4.6)

## 2021-11-03 LAB — MAGNESIUM: Magnesium: 1.9 mg/dL (ref 1.7–2.4)

## 2021-11-03 MED ORDER — LACTATED RINGERS IV BOLUS
1000.0000 mL | Freq: Once | INTRAVENOUS | Status: AC
  Administered 2021-11-03: 1000 mL via INTRAVENOUS

## 2021-11-03 NOTE — Progress Notes (Signed)
BP 87/49 pulse 94 after morphine '4mg'$ .. Dr. Constance Haw ordered LR bolus and H&H.  Having no symptoms.  Ambulated to bathroom twice with walker and standby with no dizziness.

## 2021-11-03 NOTE — Progress Notes (Signed)
Ate all of grits this morning and denies any increased pain or nausea.  Still rating pain around a 4 and received scheduled tylenol and declined any prn pain medication.

## 2021-11-03 NOTE — Progress Notes (Addendum)
I was present with the medical student for this service. I personally verified the history of present illness, performed the physical exam, and made the plan for this encounter. I have verified the medical student's documentation and made modifications where appropriately. I have personally documented in my own words a brief history, physical, and plan below.     Doing well and having BM. Pain controlled.  Incision covered with honeycomb and dressings, minimally distended and appropriately tender  Full liquid diet Ambulate Labs in AM may need blood RN to call if worsening BP or tachycardia, may have to get H&H sooner   Albert Labrum, MD [facility] [place], [place] [phone] (office)    1 Day Post-Op  Subjective: Albert Gonzalez is a 71 yo with PMH of COPD, current AAA without indication for repair, and prior hernia repair admitted with neoplasm of colon hepatic flexure now s/p extended right hemicolectomy with extracorporeal anastomosis.  This morning, the patient reports that he is feeling well. He reports having a sizeable bowel movement this morning and that he has been passing flatus. He was also able to ambulate down the unit to the nurse's station following the BM while holding onto rails without dizziness or significant pain. The patient reports his pain as a 4/10 on a half a pill of oxycodone 5-10 (pill cut in half due to hypotension) and acetaminophen 1000 mg. He states that his appetite is good and that he will "eat anything they put in front of me." He has been on a thin liquid diet since yesterday and will be transitioned to full liquids today. The patient does not report any additional concerns at this time.  Objective: Vital signs in last 24 hours: Temp:  [98 F (36.7 C)-98.9 F (37.2 C)] 98 F (36.7 C) (09/07 0354) Pulse Rate:  [84-99] 84 (09/07 0354) Resp:  [11-18] 18 (09/06 1226) BP: (92-151)/(56-91) 92/56  (09/07 0354) SpO2:  [94 %-100 %] 94 % (09/07 0354) Weight:  [78.3 kg] 78.3 kg (09/07 0354)   Intake/Output from previous day: 09/06 0701 - 09/07 0700 In: 3660 [P.O.:960; I.V.:2600; IV Piggyback:100] Out: 1400 [Urine:1200; Blood:200] Intake/Output this shift: No intake/output data recorded.  General appearance: alert, appears stated age, and no distress Resp: clear to auscultation bilaterally Cardio: regular rate and rhythm, S1, S2 normal, no murmur, click, rub or gallop GI: Mild tenderness to light palpation in RUQ and epigastrium. No rebound or guarding. Abdomen soft and nondistended. Dressing intact and dry. Extremities: extremities normal, atraumatic, no cyanosis or edema and no edema, redness or tenderness in the calves or thighs Pulses: 2+ and symmetric Neurologic: Grossly normal Incision/Wound: No surrounding erythema, drainage, or bleeding. Honeycomb dressing dry and intact.  Lab Results:  Recent Labs    11/03/21 0428  WBC 15.4*  HGB 7.6*  HCT 25.5*  PLT 306   BMET Recent Labs    11/03/21 0428  NA 141  K 4.4  CL 109  CO2 27  GLUCOSE 140*  BUN 9  CREATININE 0.88  CALCIUM 8.6*   PT/INR No results for input(s): "LABPROT", "INR" in the last 72 hours.  Studies/Results: No results found.  Anti-infectives: Anti-infectives (From admission, onward)    Start     Dose/Rate Route Frequency Ordered Stop   11/02/21 2000  cefoTEtan (CEFOTAN) 2 g in sodium chloride 0.9 % 100 mL IVPB        2 g 200 mL/hr over 30 Minutes Intravenous Every 12 hours 11/02/21 1212  11/03/21 2159   11/02/21 0705  sodium chloride 0.9 % with cefoTEtan (CEFOTAN) ADS Med       Note to Pharmacy: Albert Gonzalez R: cabinet override      11/02/21 0705 11/02/21 0811   11/02/21 0630  cefoTEtan (CEFOTAN) 2 g in sodium chloride 0.9 % 100 mL IVPB        2 g 200 mL/hr over 30 Minutes Intravenous On call to O.R. 11/02/21 0628 11/02/21 0800       Assessment/Plan: Albert Gonzalez is Post Operative  Day 1 s/p LAPAROSCOPIC EXTENDED RIGHT HEMICOLECTOMY.  Neuro Patient's pain is well controlled and no pain medication adjustments needed at this time.  Pulmonary Patient currently taking Albert Gonzalez inhaler BID and duloxetine daily and is not short of breath on room air.  Cardiac Patient was acutely hypotensive this morning, most likely secondary to surgical blood loss. He did not report any concerning symptoms (including dizziness, chills, or confusion) during this time and remained afebrile.  -- Continue to monitor vitals per unit procedure  GI The patient has had a BM and is passing flatus, which is reassuring. He will advance to full liquid diet today and continues on his  -- Advance diet daily with Boost supplement daily -- Continue ERAS protocol with tizanidine 5 mg PO daily and   GU Patient has excellent urine output and does not require Foley catheterization at this time. -- Obtain BMP, mag, and phosphorus levels daily  Heme Patient's hemoglobin has trended down to 7.6 from 10.6 -- Trend H&H daily using CBC w/ diff -- Consider transfusing PRBC if hemoglobin trends below 7 or patient becomes acutely symptomatic  ID Patient has leukocytosis of 15.4 WBC but remains afebrile and the leukocytes are likely an inflammatory post-surgical response. -- Trend WBC daily and continue to monitor for signs of infection, especially fevers.  Prophylaxis Patient on SCDs currently -- Start heparin IM Q8h this afternoon.  Disposition Patient is progressing through postoperative course well but requires continuous hospitalization for postoperative management and observation.    LOS: 1 day    Albert Gonzalez 11/03/2021

## 2021-11-03 NOTE — Progress Notes (Signed)
[facility]  H&h drifted some but nothing major. Plan for labs in Am. No blood right now.  LR bolus.  Monitor Bp.  Curlene Labrum, MD

## 2021-11-04 LAB — CBC WITH DIFFERENTIAL/PLATELET
Abs Immature Granulocytes: 0.07 10*3/uL (ref 0.00–0.07)
Basophils Absolute: 0 10*3/uL (ref 0.0–0.1)
Basophils Relative: 0 %
Eosinophils Absolute: 0.1 10*3/uL (ref 0.0–0.5)
Eosinophils Relative: 0 %
HCT: 22.3 % — ABNORMAL LOW (ref 39.0–52.0)
Hemoglobin: 6.8 g/dL — CL (ref 13.0–17.0)
Immature Granulocytes: 1 %
Lymphocytes Relative: 12 %
Lymphs Abs: 1.5 10*3/uL (ref 0.7–4.0)
MCH: 24.3 pg — ABNORMAL LOW (ref 26.0–34.0)
MCHC: 30.5 g/dL (ref 30.0–36.0)
MCV: 79.6 fL — ABNORMAL LOW (ref 80.0–100.0)
Monocytes Absolute: 0.8 10*3/uL (ref 0.1–1.0)
Monocytes Relative: 6 %
Neutro Abs: 10.3 10*3/uL — ABNORMAL HIGH (ref 1.7–7.7)
Neutrophils Relative %: 81 %
Platelets: 266 10*3/uL (ref 150–400)
RBC: 2.8 MIL/uL — ABNORMAL LOW (ref 4.22–5.81)
RDW: 15.9 % — ABNORMAL HIGH (ref 11.5–15.5)
WBC: 12.7 10*3/uL — ABNORMAL HIGH (ref 4.0–10.5)
nRBC: 0 % (ref 0.0–0.2)

## 2021-11-04 LAB — HEMOGLOBIN AND HEMATOCRIT, BLOOD
HCT: 23.1 % — ABNORMAL LOW (ref 39.0–52.0)
HCT: 26.3 % — ABNORMAL LOW (ref 39.0–52.0)
Hemoglobin: 6.9 g/dL — CL (ref 13.0–17.0)
Hemoglobin: 8.3 g/dL — ABNORMAL LOW (ref 13.0–17.0)

## 2021-11-04 LAB — BASIC METABOLIC PANEL
Anion gap: 5 (ref 5–15)
BUN: 10 mg/dL (ref 8–23)
CO2: 27 mmol/L (ref 22–32)
Calcium: 8.1 mg/dL — ABNORMAL LOW (ref 8.9–10.3)
Chloride: 110 mmol/L (ref 98–111)
Creatinine, Ser: 0.77 mg/dL (ref 0.61–1.24)
GFR, Estimated: >60 mL/min (ref >60)
Glucose, Bld: 92 mg/dL (ref 70–99)
Potassium: 3.6 mmol/L (ref 3.5–5.1)
Sodium: 142 mmol/L (ref 135–145)

## 2021-11-04 LAB — PREPARE RBC (CROSSMATCH)

## 2021-11-04 LAB — MAGNESIUM: Magnesium: 2 mg/dL (ref 1.7–2.4)

## 2021-11-04 LAB — PHOSPHORUS: Phosphorus: 2.3 mg/dL — ABNORMAL LOW (ref 2.5–4.6)

## 2021-11-04 MED ORDER — POTASSIUM & SODIUM PHOSPHATES 280-160-250 MG PO PACK
1.0000 | PACK | Freq: Three times a day (TID) | ORAL | Status: AC
Start: 2021-11-04 — End: 2021-11-04
  Administered 2021-11-04 (×2): 1 via ORAL
  Filled 2021-11-04 (×2): qty 1

## 2021-11-04 MED ORDER — SODIUM CHLORIDE 0.9% IV SOLUTION: Freq: Once | INTRAVENOUS | Status: AC

## 2021-11-04 MED ORDER — FUROSEMIDE 10 MG/ML IJ SOLN
20.0000 mg | Freq: Once | INTRAMUSCULAR | Status: AC
Start: 2021-11-04 — End: 2021-11-04
  Administered 2021-11-04: 20 mg via INTRAVENOUS
  Filled 2021-11-04: qty 2

## 2021-11-04 NOTE — Care Management Important Message (Signed)
Important Message  Patient Details  Name: Albert Gonzalez MRN: [id number] Date of Birth: [birth year]   Medicare Important Message Given:  Yes     [name] 11/04/2021, 1:46 PM

## 2021-11-04 NOTE — Progress Notes (Addendum)
I was present with the medical student for this service. I personally verified the history of present illness, performed the physical exam, and made the plan for this encounter. I have verified the medical student's documentation and made modifications where appropriately. I have personally documented in my own words a brief history, physical, and plan below.     Doing well but H&H drifting and some lower Bps. Plan for RBC today X 2. Will get H&H after. Doing well with diet, ambulating, and Bms. No blood in Bms.  Curlene Labrum, MD [facility] [place], [place] [address] [phone] (office)   2 Days Post-Op  Subjective: Albert Gonzalez is a 71 yo with PMH of COPD, current AAA without indication for repair, and prior hernia repair admitted with neoplasm of colon hepatic flexure now s/p extended right hemicolectomy with extracorporeal anastomosis.  This morning, the patient reports that he continues to feel well and is happy because he slept well overnight.  His pain level is 0/10 on only acetaminophen overnight and he has not had any issues with his He notes that he ambulated across the unit yesterday afternoon and again in the evening (used walker per nursing request, but does not think he needed it).  He denies any lightheadedness or dizziness while ambulating. The patient continues to have bowel movements without blood in stool or urine. He denies nausea/vomiting. He is eating everything he is given and reports that he feels hungry.   Objective: Vital signs in last 24 hours: Temp:  [97.6 F (36.4 C)-98.7 F (37.1 C)] 97.6 F (36.4 C) (09/08 0500) Pulse Rate:  [78-94] 78 (09/08 0500) Resp:  [16-17] 17 (09/08 0500) BP: (87-127)/(49-78) 105/75 (09/08 0500) SpO2:  [96 %-99 %] 97 % (09/08 0756) Weight:  [78.2 kg] 78.2 kg (09/08 0500) Last BM Date : 11/03/21  Intake/Output from previous day: 09/07 0701 - 09/08 0700 In: 1140 [P.O.:1140] Out: 1800  [Urine:1800] Intake/Output this shift: No intake/output data recorded.  General appearance: alert, appears stated age, and no distress Throat: Moist mucous membranes Resp: clear to auscultation bilaterally and normal WOB on room air Cardio: regular rate and rhythm, S1, S2 normal, no murmur, click, rub or gallop GI: No tenderness to light palpation. No guarding or rebound. Abdomen soft and nondistended. Extremities: extremities normal, atraumatic, no cyanosis or edema and capillary refill <2 seconds Pulses: 2+ and symmetric Skin: Skin color, texture, turgor normal. No rashes or lesions Incision/Wound: Honeycomb dressing intact and dry. Appropriate tenderness surrounding incision site and minimal distension.  Lab Results:  Recent Labs    11/03/21 0428 11/03/21 1505 11/04/21 0502 11/04/21 0720  WBC 15.4*  --  12.7*  --   HGB 7.6*   < > 6.8* 6.9*  HCT 25.5*   < > 22.3* 23.1*  PLT 306  --  266  --    < > = values in this interval not displayed.   BMET Recent Labs    11/03/21 0428 11/04/21 0502  NA 141 142  K 4.4 3.6  CL 109 110  CO2 27 27  GLUCOSE 140* 92  BUN 9 10  CREATININE 0.88 0.77  CALCIUM 8.6* 8.1*   PT/INR No results for input(s): "LABPROT", "INR" in the last 72 hours.  Studies/Results: No results found.  Anti-infectives: Anti-infectives (From admission, onward)    Start     Dose/Rate Route Frequency Ordered Stop   11/02/21 2000  cefoTEtan (CEFOTAN) 2 g in sodium chloride 0.9 % 100 mL IVPB  2 g 200 mL/hr over 30 Minutes Intravenous Every 12 hours 11/02/21 1212 11/03/21 1134   11/02/21 0705  sodium chloride 0.9 % with cefoTEtan (CEFOTAN) ADS Med       Note to Pharmacy: Sofie Rower R: cabinet override      11/02/21 0705 11/03/21 1134   11/02/21 0630  cefoTEtan (CEFOTAN) 2 g in sodium chloride 0.9 % 100 mL IVPB        2 g 200 mL/hr over 30 Minutes Intravenous On call to O.R. 11/02/21 0628 11/02/21 0800       Assessment/Plan: Albert Gonzalez is  Post Operative Day 2 s/p LAPAROSCOPIC EXTENDED RIGHT HEMICOLECTOMY.  Neuro Patient's pain is very well controlled on solely acetaminophen and no pain medication adjustments needed at this time. Patient will be given oral opioid medication for breakthrough pain at discharge.   Pulmonary Patient currently taking Dulera inhaler BID and duloxetine daily and is not short of breath on room air or with ambulation.   Cardiac Patient continues to have borderline hypotension likely caused by surgical blood loss and fluid status considering his anemia. His BP improved mildly following 1000 mL LR bolus yesterday, but is still low. His blood transfusion today should help. Will reassess the need for further fluid boluses or maintenance IV fluids following transfusion. -- Continue to monitor vitals per unit procedure  GI The patient continues to have BMs and pass flatus with appropriate PO intake. -- Continue daily Boost supplementation -- Continue ERAS protocol   GU Patient continues to have excellent urine output and does not require Foley catheterization at this time. -- Obtain BMP, mag, and phosphorus levels daily   Heme Patient's hemoglobin has trended down to 6.8 from 7.6 yesterday and was 6.9 on repeat.  Patient continues to have borderline hypotensive blood pressures, though is otherwise asymptomatic.  This continued drop in hemoglobin is likely due to hydration status and surgical blood loss following an extensive surgery and he will likely benefit from a blood transfusion. -- Transfuse 2 units PRBC with LASIX 20 mg IV between transfusions and follow-up H&H -- Trend H&H daily   ID Patient's leukocytes trended down to 12.7 from 15.4, further supporting the likelihood that his leukocytosis is an inflammatory post-surgical response. He does not report any concerning symptoms (including dizziness, chills, or confusion) and remains afebrile. -- Trend WBC daily and continue to monitor for signs of  infection, especially fevers   Prophylaxis Patient on SCDs while in bed. -- Continue heparin IM Q8h this afternoon.   Disposition Consider discharge tomorrow if patient is hemodynamically stable and H&H trends up following transfusion.    LOS: 2 days    Dimitry [facility] 11/04/2021

## 2021-11-04 NOTE — Progress Notes (Signed)
[facility]  H&H responded appropriately. Will continue to monitor blood pressure. Patient is asymptomatic with no dizziness, able to ambulate, no chest pain, or shortness of breath.   Curlene Labrum, MD

## 2021-11-04 NOTE — Progress Notes (Signed)
Date and time results received: 11/04/21 0614 (use smartphrase ".now" to insert current time)  Test: Hgb Critical Value: 6.8  Name of Provider Notified: Dr. Josephine Cables  Orders Received? Or Actions Taken?: NNO  at this time

## 2021-11-05 LAB — BPAM RBC
Blood Product Expiration Date: 202310032359
Blood Product Expiration Date: 202310102359
ISSUE DATE / TIME: 202309081114
ISSUE DATE / TIME: 202309081416
Unit Type and Rh: 5100
Unit Type and Rh: 5100

## 2021-11-05 LAB — CBC WITH DIFFERENTIAL/PLATELET
Abs Immature Granulocytes: 0.05 10*3/uL (ref 0.00–0.07)
Basophils Absolute: 0.1 10*3/uL (ref 0.0–0.1)
Basophils Relative: 1 %
Eosinophils Absolute: 0.4 10*3/uL (ref 0.0–0.5)
Eosinophils Relative: 4 %
HCT: 28.8 % — ABNORMAL LOW (ref 39.0–52.0)
Hemoglobin: 9.1 g/dL — ABNORMAL LOW (ref 13.0–17.0)
Immature Granulocytes: 1 %
Lymphocytes Relative: 19 %
Lymphs Abs: 2.1 10*3/uL (ref 0.7–4.0)
MCH: 25.5 pg — ABNORMAL LOW (ref 26.0–34.0)
MCHC: 31.6 g/dL (ref 30.0–36.0)
MCV: 80.7 fL (ref 80.0–100.0)
Monocytes Absolute: 0.7 10*3/uL (ref 0.1–1.0)
Monocytes Relative: 6 %
Neutro Abs: 7.4 10*3/uL (ref 1.7–7.7)
Neutrophils Relative %: 69 %
Platelets: 294 10*3/uL (ref 150–400)
RBC: 3.57 MIL/uL — ABNORMAL LOW (ref 4.22–5.81)
RDW: 16.6 % — ABNORMAL HIGH (ref 11.5–15.5)
WBC: 10.6 10*3/uL — ABNORMAL HIGH (ref 4.0–10.5)
nRBC: 0 % (ref 0.0–0.2)

## 2021-11-05 LAB — BASIC METABOLIC PANEL
Anion gap: 4 — ABNORMAL LOW (ref 5–15)
BUN: 13 mg/dL (ref 8–23)
CO2: 27 mmol/L (ref 22–32)
Calcium: 8.1 mg/dL — ABNORMAL LOW (ref 8.9–10.3)
Chloride: 109 mmol/L (ref 98–111)
Creatinine, Ser: 0.71 mg/dL (ref 0.61–1.24)
GFR, Estimated: >60 mL/min (ref >60)
Glucose, Bld: 93 mg/dL (ref 70–99)
Potassium: 3.7 mmol/L (ref 3.5–5.1)
Sodium: 140 mmol/L (ref 135–145)

## 2021-11-05 LAB — TYPE AND SCREEN
ABO/RH(D): O POS
Antibody Screen: NEGATIVE
Unit division: 0
Unit division: 0

## 2021-11-05 LAB — PHOSPHORUS: Phosphorus: 2.7 mg/dL (ref 2.5–4.6)

## 2021-11-05 MED ORDER — OXYCODONE HCL 5 MG PO TABS
5.0000 mg | ORAL_TABLET | ORAL | 0 refills | Status: DC | PRN
Start: 2021-11-05 — End: 2021-11-16

## 2021-11-05 MED ORDER — ONDANSETRON HCL 4 MG PO TABS: 4.0000 mg | ORAL_TABLET | Freq: Four times a day (QID) | ORAL | 0 refills | Status: DC | PRN

## 2021-11-05 NOTE — TOC Progression Note (Signed)
  Transition of Care [facility]) Screening Note   Patient Details  Name: Albert Gonzalez Date of Birth: [birth year]   Transition of Care [facility] CM/SW Contact:    Boneta Lucks, RN Phone Number: 11/05/2021, 9:21 AM    Transition of Care Department [facility]) has reviewed patient and no TOC needs have been identified at this time. We will continue to monitor patient advancement through interdisciplinary progression rounds. If new patient transition needs arise, please place a TOC consult.         Living arrangements for the past 2 months: [place]

## 2021-11-05 NOTE — Discharge Instructions (Addendum)
Discharge Open Abdominal Surgery Instructions:  Monitor your BP twice a day to make sure it is trending back to normal. You were in the 90W systolic at times in the hospital.   Common Complaints: Pain at the incision site is common. This will improve with time. Take your pain medications as described below. Some nausea is common and poor appetite. The main goal is to stay hydrated the first few days after surgery.   Diet/ Activity: Diet as tolerated.  Low fiber, easy to digest foods for the next month.   You may not have a large appetite, but it is important to stay hydrated. Drink 64 ounces of water a day. Your appetite will return with time.  Keep a dry dressing in place over your staples daily or as needed. Some minor pink/ blood tinged drainage is expected. This will stop in a few days after surgery.  Shower per your regular routine daily.  Do not take hot showers. Take warm showers that are less than 10 minutes. Path the incision dry. Wear an abdominal binder daily with activity. You do not have to wear this while sleeping or sitting.  Rest and listen to your body, but do not remain in bed all day.  Walk everyday for at least 15-20 minutes. Deep cough and move around every 1-2 hours in the first few days after surgery.  Do not lift > 10 lbs, perform excessive bending, pushing, pulling, squatting for 6-8 weeks after surgery.  The activity restrictions and the abdominal binder are to prevent hernia formation at your incision while you are healing.  Do not place lotions or balms on your incision unless instructed to specifically by Dr. Constance Haw.   Pain Expectations and Narcotics: -After surgery you will have pain associated with your incisions and this is normal. The pain is muscular and nerve pain, and will get better with time. -You are encouraged and expected to take non narcotic medications like tylenol and ibuprofen (when able) to treat pain as multiple modalities can aid with pain  treatment. -Narcotics are only used when pain is severe or there is breakthrough pain. -You are not expected to have a pain score of 0 after surgery, as we cannot prevent pain. A pain score of 3-4 that allows you to be functional, move, walk, and tolerate some activity is the goal. The pain will continue to improve over the days after surgery and is dependent on your surgery. -Due to [place] law, we are only able to give a certain amount of pain medication to treat post operative pain, and we only give additional narcotics on a patient by patient basis.  -For most laparoscopic surgery, studies have shown that the majority of patients only need 10-15 narcotic pills, and for open surgeries most patients only need 15-20.   -Having appropriate expectations of pain and knowledge of pain management with non narcotics is important as we do not want anyone to become addicted to narcotic pain medication.  -Using ice packs in the first 48 hours and heating pads after 48 hours, wearing an abdominal binder (when recommended), and using over the counter medications are all ways to help with pain management.   -Simple acts like meditation and mindfulness practices after surgery can also help with pain control and research has proven the benefit of these practices.  Medication: Take tylenol and ibuprofen as needed for pain control, alternating every 4-6 hours.  Example:  Tylenol '1000mg'$  @ 6am, 12noon, 6pm, 24mdnight (Do not exceed '4000mg'$  of  tylenol a day). Ibuprofen '800mg'$  @ 9am, 3pm, 9pm, 3am (Do not exceed '3600mg'$  of ibuprofen a day).  Take Roxicodone for breakthrough pain every 4 hours.  Take Colace for constipation related to narcotic pain medication. If you do not have a bowel movement in 2 days, take Miralax over the counter.  Drink plenty of water to also prevent constipation.   Contact Information: If you have questions or concerns, please call our office, [phone], Monday- Thursday 8AM-5PM and Friday  8AM-12Noon.  If it is after hours or on the weekend, please call [facility] Main Number, [phone], [phone], and ask to speak to the surgeon on call for Dr. Constance Haw at [facility].

## 2021-11-05 NOTE — Discharge Summary (Signed)
Physician Discharge Summary  Patient ID: Albert Gonzalez MRN: [id number] DOB/AGE: February 17, 1951 71 y.o.  Admit date: 11/02/2021 Discharge date: 11/05/2021  Admission Diagnoses: Colon cancer   Discharge Diagnoses:  Principal Problem:   Neoplasm of hepatic flexure of colon Active Problems:   Colon cancer [facility])   Discharged Condition: good  Hospital Course: Mr. Albert Gonzalez is doing well after his laparoscopic right hemicolectomy and extracorporeal anastomosis. He did have a drift in his H&H and required transfusion and this responded appropriately. He had some systolic Bps to 96-22 and this was always asymptomatic without dizziness or chest pain but given his age and drifting H&H he was transfused.   He is doing well today, eating, ambulating, having Bms and pain controlled. He wants to go home.   Consults: None  Significant Diagnostic Studies:   Latest Reference Range & Units 11/04/21 05:02 11/04/21 07:20 11/04/21 17:31 11/05/21 05:42  WBC 4.0 - 10.5 K/uL 12.7 (H)   10.6 (H)  RBC 4.22 - 5.81 MIL/uL 2.80 (L)   3.57 (L)  Hemoglobin 13.0 - 17.0 g/dL 6.8 (LL) 6.9 (LL) 8.3 (L) 9.1 (L)  HCT 39.0 - 52.0 % 22.3 (L) 23.1 (L) 26.3 (L) 28.8 (L)  (LL): Data is critically low (H): Data is abnormally high (L): Data is abnormally low   Treatments: 11/02/21 laparoscopic extended right hemicolectomy; blood transfusion 11/04/21   Discharge Exam: Blood pressure 129/89, pulse 93, temperature 98.2 F (36.8 C), temperature source Oral, resp. rate 18, height '5\' 11"'$  (1.803 m), weight 77.4 kg, SpO2 97 %. General appearance: alert and no distress Resp: normal work of breathing GI: soft, minimally distended, appropriately tender, staples c/d/I without erythema or drainage, all dressings removed   Disposition: Discharge disposition: 01-Home or Self Care       Discharge Instructions     Call MD for:  difficulty breathing, headache or visual disturbances   Complete by: As directed    Call MD for:  extreme  fatigue   Complete by: As directed    Call MD for:  hives   Complete by: As directed    Call MD for:  persistant dizziness or light-headedness   Complete by: As directed    Call MD for:  persistant nausea and vomiting   Complete by: As directed    Call MD for:  redness, tenderness, or signs of infection (pain, swelling, redness, odor or green/yellow discharge around incision site)   Complete by: As directed    Call MD for:  severe uncontrolled pain   Complete by: As directed    Call MD for:  temperature >100.4   Complete by: As directed    Increase activity slowly   Complete by: As directed       Allergies as of 11/05/2021       Reactions   Sulfa Antibiotics Rash   Childhood         Medication List     TAKE these medications    albuterol 108 (90 Base) MCG/ACT inhaler Commonly known as: VENTOLIN HFA Inhale 2 puffs into the lungs 2 (two) times daily.   ALPRAZolam 0.5 MG tablet Commonly known as: XANAX Take 1 tablet (0.5 mg total) by mouth daily as needed for anxiety. What changed: when to take this   aspirin 325 MG tablet Take 325 mg by mouth daily.   atorvastatin 40 MG tablet Commonly known as: LIPITOR Take 40 mg daily at 6 PM by mouth.   busPIRone 10 MG tablet Commonly known as: BUSPAR Take  10 mg by mouth 3 (three) times daily.   CALCIUM + D PO Take 2 tablets by mouth daily.   diclofenac 75 MG EC tablet Commonly known as: VOLTAREN Take 75 mg by mouth 2 (two) times daily.   DULoxetine 60 MG capsule Commonly known as: CYMBALTA TAKE 1 (ONE) CAPSULE BY MOUTH TWO TIMES DAILY What changed: See the new instructions.   fluticasone-salmeterol 250-50 MCG/ACT Aepb Commonly known as: ADVAIR Inhale 1 puff into the lungs in the morning and at bedtime.   GLUCOSAMINE CHOND DOUBLE STR PO Take 2 tablets by mouth daily.   hydrOXYzine 25 MG capsule Commonly known as: VISTARIL Take 2 capsules (50 mg total) by mouth at bedtime. give 30-53mn before bedtime    ibuprofen 200 MG tablet Commonly known as: ADVIL Take 800 mg by mouth daily.   multivitamin tablet Take 1 tablet by mouth daily. Men's 50+   ondansetron 4 MG tablet Commonly known as: ZOFRAN Take 1 tablet (4 mg total) by mouth every 6 (six) hours as needed for nausea.   oxyCODONE 5 MG immediate release tablet Commonly known as: Oxy IR/ROXICODONE Take 1 tablet (5 mg total) by mouth every 4 (four) hours as needed for breakthrough pain or severe pain.   polyethylene glycol 17 g packet Commonly known as: MIRALAX / GLYCOLAX Take 17 g by mouth daily as needed for mild constipation. What changed: when to take this   tiZANidine 4 MG tablet Commonly known as: ZANAFLEX Take 1 tablet (4 mg total) by mouth daily.        Follow-up Information     BVirl Cagey MD Follow up on 11/16/2021.   Specialty: General Surgery Why: staple removal Contact information: [address] HoffNSt [phone]                Signed: LVirl Cagey9/10/2021, 12:02 PM

## 2021-11-07 ENCOUNTER — Telehealth: Payer: Self-pay

## 2021-11-07 LAB — SURGICAL PATHOLOGY

## 2021-11-07 NOTE — Telephone Encounter (Signed)
Transition Care Management Follow-up Telephone Call Date of discharge and from where: 11/05/21 [place] How have you been since you were released from the hospital? OK  Any questions or concerns? No  Items Reviewed: Did the pt receive and understand the discharge instructions provided? Yes  Medications obtained and verified? Yes  Other?  N/A Any new allergies since your discharge? No  Dietary orders reviewed? Yes Do you have support at home? Yes   Home Care and Equipment/Supplies: Were home health services ordered? no If so, what is the name of the agency? N/A  Has the agency set up a time to come to the patient's home? not applicable Were any new equipment or medical supplies ordered?  No What is the name of the medical supply agency? N/A Were you able to get the supplies/equipment? not applicable Do you have any questions related to the use of the equipment or supplies? No  Functional Questionnaire: (I = Independent and D = Dependent) ADLs: I  Bathing/Dressing- I  Meal Prep- I  Eating- I  Maintaining continence- I  Transferring/Ambulation- I  Managing Meds- I  Follow up appointments reviewed:  PCP Hospital f/u appt confirmed? Yes  Scheduled to see gLORIA on 11/14/21 @ 1:20. [place] [facility] f/u appt confirmed? Yes  Scheduled to see Dr Constance Haw on 11/16/21 Are transportation arrangements needed? No  If their condition worsens, is the pt aware to call PCP or go to the Emergency Dept.? Yes Was the patient provided with contact information for the PCP's office or ED? Yes Was to pt encouraged to call back with questions or concerns? Yes

## 2021-11-08 ENCOUNTER — Other Ambulatory Visit: Payer: Self-pay | Admitting: Internal Medicine

## 2021-11-08 ENCOUNTER — Encounter (HOSPITAL_COMMUNITY): Payer: Self-pay | Admitting: General Surgery

## 2021-11-14 ENCOUNTER — Ambulatory Visit (INDEPENDENT_AMBULATORY_CARE_PROVIDER_SITE_OTHER): Payer: Medicare HMO | Admitting: Family Medicine

## 2021-11-14 ENCOUNTER — Encounter: Payer: Self-pay | Admitting: Family Medicine

## 2021-11-14 VITALS — BP 102/67 | HR 97 | Ht 71.0 in | Wt 165.0 lb

## 2021-11-14 DIAGNOSIS — Z9049 Acquired absence of other specified parts of digestive tract: Secondary | ICD-10-CM | POA: Diagnosis not present

## 2021-11-14 DIAGNOSIS — M545 Low back pain, unspecified: Secondary | ICD-10-CM | POA: Diagnosis not present

## 2021-11-14 DIAGNOSIS — Z23 Encounter for immunization: Secondary | ICD-10-CM

## 2021-11-14 MED ORDER — TIZANIDINE HCL 4 MG PO TABS: 4.0000 mg | ORAL_TABLET | Freq: Every day | ORAL | 0 refills | Status: DC

## 2021-11-14 NOTE — Progress Notes (Signed)
[facility]  During hospitalization patient received transfusion. He had some anemia at baseline but did have blood loss form the surgery.  Acute on chronic anemia due to blood loss. Chronic blood loss from the cancer and acute blood loss from the surgery.  Curlene Labrum, MD [facility] [address] [place], [place] [address] [phone] (office)

## 2021-11-14 NOTE — Progress Notes (Unsigned)
Established Patient Office Visit  Subjective:  Patient ID: Albert Gonzalez, male    DOB: [birth year]  Age: 71 y.o. MRN: [id number]  CC:  Chief Complaint  Patient presents with   Follow-up    Pt f/u from hospital visit, pt states he has been doing quite well, surgery went well, sees Dr. Constance Haw on Wednesday to remove stitches.     HPI Albert Gonzalez is a 71 y.o. male with past medical history of Asthma, colon cancer, arthritis presents for f/u of hospital visit.    S/P Hemicolectomy: The patient had a laparoscopic right hemicolectomy on 11/02/21  for neoplasm of hepatic flexure of the colon. He received 2 units of blood for hemoglobin of 6.3. His hemoglobin at discharged on 11/05/21 was 9.1. He denies symptoms of anemia, noting that he has not resumed his oral iron supplement.  He reports doing well after the surgery, with no signs of infection around the surgical sites. He has an appointment for stable removal on 11/16/21 with Dr. Constance Haw.   Past Medical History:  Diagnosis Date   Anxiety    Arthritis    Cancer ([place])    COPD (chronic obstructive pulmonary disease) ([place])    Depression    History of kidney stones    Hyperlipidemia    Inguinal hernia right    Insomnia    Myalgia    PPD positive, treated 1979   1 year of INH   Raynaud phenomenon    Restless leg syndrome    Sarcoidosis 1980    Past Surgical History:  Procedure Laterality Date   AXILLARY LYMPH NODE BIOPSY Right 1980   BIOPSY  09/28/2021   Procedure: BIOPSY;  Surgeon: Daneil Dolin, MD;  [facility];  Service: Endoscopy;;   COLONOSCOPY  02/08/2011   Procedure: COLONOSCOPY;  Surgeon: Daneil Dolin, MD;  [facility];  Service: Endoscopy;  Laterality: N/A;  1:30 PM   COLONOSCOPY WITH PROPOFOL N/A 09/28/2021   Procedure: COLONOSCOPY WITH PROPOFOL;  Surgeon: Daneil Dolin, MD;  [facility];  Service: Endoscopy;  Laterality: N/A;  2:15pm   INGUINAL HERNIA REPAIR Right 01/29/2017    Procedure: HERNIA REPAIR INGUINAL ADULT WITH MESH;  Surgeon: Virl Cagey, MD;  Location: [facility];  Service: General;  Laterality: Right;   KNEE ARTHROSCOPY WITH MEDIAL MENISECTOMY Right 04/05/2016   Procedure: RIGHT KNEE ARTHROSCOPY WITH PARTIAL  LATERAL MENISECTOMY, DEBRIDEMENT MEDIAL MENISCUS, ACL DEBRIDEMENT, MICRO FRACTURE OF FEMUR;  Surgeon: Carole Civil, MD;  Location: [facility];  Service: Orthopedics;  Laterality: Right;   KNEE SURGERY     left   KYPHOPLASTY     LAPAROSCOPIC PARTIAL COLECTOMY N/A 11/02/2021   Procedure: LAPAROSCOPIC EXTENDED RIGHT HEMICOLECTOMY;  Surgeon: Virl Cagey, MD;  Location: [facility];  Service: General;  Laterality: N/A;   POLYPECTOMY  09/28/2021   Procedure: POLYPECTOMY;  Surgeon: Daneil Dolin, MD;  [facility];  Service: Endoscopy;;   TONSILLECTOMY     TOTAL KNEE ARTHROPLASTY Right 02/08/2021   Procedure: TOTAL KNEE ARTHROPLASTY;  Surgeon: Carole Civil, MD;  Location: [facility];  Service: Orthopedics;  Laterality: Right;    Family History  Problem Relation Age of Onset   Suicidality Father    Colon cancer Neg Hx    Liver disease Neg Hx    Inflammatory bowel disease Neg Hx    Anesthesia problems Neg Hx     Social History   Socioeconomic History  Marital status: Married    Spouse name: Not on file   Number of children: 1   Years of education: Not on file   Highest education level: Not on file  Occupational History   Occupation: respiratory therapist    Employer: [employer]  Tobacco Use   Smoking status: Former    Packs/day: 0.50    Years: 20.00    Total pack years: 10.00    Types: Pipe, Cigarettes    Quit date: 04/03/1990    Years since quitting: 31.6   Smokeless tobacco: Never   Tobacco comments:    quit many years ago  Vaping Use   Vaping Use: Never used  Substance and Sexual Activity   Alcohol use: Not Currently    Comment: occassional   Drug use: No   Sexual activity: Yes  Other  Topics Concern   Not on file  Social History Narrative   Not on file   Social Determinants of Health   Financial Resource Strain: Not on file  Food Insecurity: No Food Insecurity (11/02/2021)   Hunger Vital Sign    Worried About Running Out of Food in the Last Year: Never true    Ran Out of Food in the Last Year: Never true  Transportation Needs: No Transportation Needs (11/02/2021)   PRAPARE - Hydrologist (Medical): No    Lack of Transportation (Non-Medical): No  Physical Activity: Not on file  Stress: Not on file  Social Connections: Not on file  Intimate Partner Violence: Not At Risk (11/02/2021)   Humiliation, Afraid, Rape, and Kick questionnaire    Fear of Current or Ex-Partner: No    Emotionally Abused: No    Physically Abused: No    Sexually Abused: No    Outpatient Medications Prior to Visit  Medication Sig Dispense Refill   albuterol (VENTOLIN HFA) 108 (90 Base) MCG/ACT inhaler Inhale 2 puffs into the lungs 2 (two) times daily.     ALPRAZolam (XANAX) 0.5 MG tablet Take 1 tablet (0.5 mg total) by mouth daily as needed for anxiety. (Patient taking differently: Take 0.5 mg by mouth at bedtime.) 5 tablet 0   aspirin 325 MG tablet Take 325 mg by mouth daily.     atorvastatin (LIPITOR) 40 MG tablet TAKE ONE TABLET BY MOUTH EVERY NIGHT AT BEDTIME 90 tablet 0   busPIRone (BUSPAR) 10 MG tablet Take 10 mg by mouth 3 (three) times daily.     Calcium Citrate-Vitamin D (CALCIUM + D PO) Take 2 tablets by mouth daily.     diclofenac (VOLTAREN) 75 MG EC tablet Take 75 mg by mouth 2 (two) times daily.     DULoxetine (CYMBALTA) 60 MG capsule TAKE 1 (ONE) CAPSULE BY MOUTH TWO TIMES DAILY (Patient taking differently: Take 60 mg by mouth 2 (two) times daily.) 180 capsule 1   fluticasone-salmeterol (ADVAIR) 250-50 MCG/ACT AEPB Inhale 1 puff into the lungs in the morning and at bedtime.     hydrOXYzine (VISTARIL) 25 MG capsule Take 2 capsules (50 mg total) by mouth at  bedtime. give 30-37mn before bedtime 30 capsule 0   ibuprofen (ADVIL) 200 MG tablet Take 800 mg by mouth daily.     Misc Natural Products (GLUCOSAMINE CHOND DOUBLE STR PO) Take 2 tablets by mouth daily.     Multiple Vitamin (MULTIVITAMIN) tablet Take 1 tablet by mouth daily. Men's 50+     ondansetron (ZOFRAN) 4 MG tablet Take 1 tablet (4 mg total) by  mouth every 6 (six) hours as needed for nausea. 20 tablet 0   oxyCODONE (OXY IR/ROXICODONE) 5 MG immediate release tablet Take 1 tablet (5 mg total) by mouth every 4 (four) hours as needed for breakthrough pain or severe pain. 20 tablet 0   polyethylene glycol (MIRALAX / GLYCOLAX) 17 g packet Take 17 g by mouth daily as needed for mild constipation. (Patient taking differently: Take 17 g by mouth daily.) 14 each 0   tiZANidine (ZANAFLEX) 4 MG tablet Take 1 tablet (4 mg total) by mouth daily. 30 tablet 0   No facility-administered medications prior to visit.    Allergies  Allergen Reactions   Sulfa Antibiotics Rash    Childhood     ROS Review of Systems  Constitutional:  Negative for chills, fatigue and fever.  Respiratory:  Negative for chest tightness and shortness of breath.   Gastrointestinal:  Negative for diarrhea, nausea and vomiting.  Skin:  Positive for wound.  Neurological:  Negative for dizziness.  Psychiatric/Behavioral:  Negative for self-injury and suicidal ideas.       Objective:    Physical Exam HENT:     Head: Normocephalic.  Cardiovascular:     Rate and Rhythm: Normal rate and regular rhythm.     Pulses: Normal pulses.  Pulmonary:     Effort: Pulmonary effort is normal.     Breath sounds: Normal breath sounds.  Skin:    Comments: The surgical site is free from redness, pain, warmth, and swelling  Neurological:     Mental Status: He is alert.     BP 102/67   Pulse 97   Ht 5' 11"  (1.803 m)   Wt 165 lb 0.6 oz (74.9 kg)   SpO2 94%   BMI 23.02 kg/m  Wt Readings from Last 3 Encounters:  11/14/21 165 lb  0.6 oz (74.9 kg)  11/05/21 170 lb 10.2 oz (77.4 kg)  10/28/21 167 lb 8.8 oz (76 kg)    Lab Results  Component Value Date   TSH 2.770 09/22/2021   Lab Results  Component Value Date   WBC 11.9 (H) 11/14/2021   HGB 10.8 (L) 11/14/2021   HCT 36.6 (L) 11/14/2021   MCV 82 11/14/2021   PLT 589 (H) 11/14/2021   Lab Results  Component Value Date   NA 138 11/14/2021   K 5.0 11/14/2021   CO2 24 11/14/2021   GLUCOSE 91 11/14/2021   BUN 14 11/14/2021   CREATININE 0.90 11/14/2021   BILITOT 0.2 09/22/2021   ALKPHOS 122 (H) 09/22/2021   AST 22 09/22/2021   ALT 17 09/22/2021   PROT 6.4 09/22/2021   ALBUMIN 4.3 09/22/2021   CALCIUM 9.8 11/14/2021   ANIONGAP 4 (L) 11/05/2021   EGFR 91 11/14/2021   Lab Results  Component Value Date   CHOL 150 09/22/2021   Lab Results  Component Value Date   HDL 45 09/22/2021   Lab Results  Component Value Date   LDLCALC 84 09/22/2021   Lab Results  Component Value Date   TRIG 117 09/22/2021   Lab Results  Component Value Date   CHOLHDL 3.3 09/22/2021   Lab Results  Component Value Date   HGBA1C 5.7 (H) 10/28/2021      Assessment & Plan:   Problem List Items Addressed This Visit       Other   Bilateral low back pain   Relevant Medications   tiZANidine (ZANAFLEX) 4 MG tablet   Other Relevant Orders   Flu Vaccine QUAD  High Dose(Fluad) (Completed)   S/P right hemicolectomy - Primary    Will get labs today to assess the patient's hemoglobin The Surgical  Site is free from infection Noted doing well after surgery with post-op appointment with Dr. Constance Haw on 11/16/21 Pending BMP and CBC      Relevant Orders   CBC with Differential/Platelet (Completed)   Basic Metabolic Panel (BMET) (Completed)   Need for immunization against influenza    Patient educated on CDC recommendation for the vaccine. Verbal consent was obtained from the patient, vaccine administered by nurse, no sign of adverse reactions noted at this time. Patient  education on arm soreness and use of tylenol or ibuprofen for this patient  was discussed. Patient educated on the signs and symptoms of adverse effect and advise to contact the office if they occur.        Meds ordered this encounter  Medications   tiZANidine (ZANAFLEX) 4 MG tablet    Sig: Take 1 tablet (4 mg total) by mouth daily.    Dispense:  30 tablet    Refill:  0    Follow-up: No follow-ups on file.    [name], FNP

## 2021-11-14 NOTE — Patient Instructions (Addendum)
I appreciate the opportunity to provide care to you today!    Follow up:  01/23/22  Labs: please stop by the lab today to get your blood drawn (CBC and BMP)  Please pick up your medication at the pharmacy   Please continue to a heart-healthy diet and increase your physical activities. Try to exercise for 34mns at least three times a week.      It was a pleasure to see you and I look forward to continuing to work together on your health and well-being. Please do not hesitate to call the office if you need care or have questions about your care.   Have a wonderful day and week. With Gratitude, GAlvira MondayMSN, FNP-BC

## 2021-11-15 DIAGNOSIS — Z23 Encounter for immunization: Secondary | ICD-10-CM | POA: Insufficient documentation

## 2021-11-15 DIAGNOSIS — Z9049 Acquired absence of other specified parts of digestive tract: Secondary | ICD-10-CM | POA: Insufficient documentation

## 2021-11-15 LAB — CBC WITH DIFFERENTIAL/PLATELET
Basophils Absolute: 0.1 10*3/uL (ref 0.0–0.2)
Basos: 1 %
EOS (ABSOLUTE): 0.5 10*3/uL — ABNORMAL HIGH (ref 0.0–0.4)
Eos: 4 %
Hematocrit: 36.6 % — ABNORMAL LOW (ref 37.5–51.0)
Hemoglobin: 10.8 g/dL — ABNORMAL LOW (ref 13.0–17.7)
Immature Grans (Abs): 0 10*3/uL (ref 0.0–0.1)
Immature Granulocytes: 0 %
Lymphocytes Absolute: 1.5 10*3/uL (ref 0.7–3.1)
Lymphs: 13 %
MCH: 24.3 pg — ABNORMAL LOW (ref 26.6–33.0)
MCHC: 29.5 g/dL — ABNORMAL LOW (ref 31.5–35.7)
MCV: 82 fL (ref 79–97)
Monocytes Absolute: 0.7 10*3/uL (ref 0.1–0.9)
Monocytes: 6 %
Neutrophils Absolute: 9 10*3/uL — ABNORMAL HIGH (ref 1.4–7.0)
Neutrophils: 76 %
Platelets: 589 10*3/uL — ABNORMAL HIGH (ref 150–450)
RBC: 4.45 x10E6/uL (ref 4.14–5.80)
RDW: 16 % — ABNORMAL HIGH (ref 11.6–15.4)
WBC: 11.9 10*3/uL — ABNORMAL HIGH (ref 3.4–10.8)

## 2021-11-15 LAB — BASIC METABOLIC PANEL
BUN/Creatinine Ratio: 16 (ref 10–24)
BUN: 14 mg/dL (ref 8–27)
CO2: 24 mmol/L (ref 20–29)
Calcium: 9.8 mg/dL (ref 8.6–10.2)
Chloride: 100 mmol/L (ref 96–106)
Creatinine, Ser: 0.9 mg/dL (ref 0.76–1.27)
Glucose: 91 mg/dL (ref 70–99)
Potassium: 5 mmol/L (ref 3.5–5.2)
Sodium: 138 mmol/L (ref 134–144)
eGFR: 91 mL/min/{1.73_m2} (ref >59)

## 2021-11-15 NOTE — Assessment & Plan Note (Signed)
Will get labs today to assess the patient's hemoglobin The Surgical  Site is free from infection Noted doing well after surgery with post-op appointment with Dr. Constance Haw on 11/16/21 Pending BMP and CBC

## 2021-11-15 NOTE — Assessment & Plan Note (Signed)
Patient educated on CDC recommendation for the vaccine. Verbal consent was obtained from the patient, vaccine administered by nurse, no sign of adverse reactions noted at this time. Patient education on arm soreness and use of tylenol or ibuprofen for this patient  was discussed. Patient educated on the signs and symptoms of adverse effect and advise to contact the office if they occur.  

## 2021-11-16 ENCOUNTER — Encounter: Payer: Self-pay | Admitting: General Surgery

## 2021-11-16 ENCOUNTER — Encounter: Payer: Self-pay | Admitting: Vascular Surgery

## 2021-11-16 ENCOUNTER — Ambulatory Visit: Payer: Medicare HMO | Admitting: Vascular Surgery

## 2021-11-16 ENCOUNTER — Ambulatory Visit (INDEPENDENT_AMBULATORY_CARE_PROVIDER_SITE_OTHER): Payer: Medicare HMO | Admitting: General Surgery

## 2021-11-16 VITALS — BP 111/74 | HR 87 | Resp 18 | Ht 71.0 in | Wt 162.4 lb

## 2021-11-16 VITALS — BP 129/72 | HR 82 | Temp 98.6°F | Ht 71.0 in | Wt 165.0 lb

## 2021-11-16 DIAGNOSIS — I7143 Infrarenal abdominal aortic aneurysm, without rupture: Secondary | ICD-10-CM | POA: Diagnosis not present

## 2021-11-16 DIAGNOSIS — D490 Neoplasm of unspecified behavior of digestive system: Secondary | ICD-10-CM

## 2021-11-16 NOTE — Progress Notes (Signed)
Patient name: Albert Gonzalez MRN: [id number] DOB: [birth year] Sex: male  REASON FOR CONSULT: Evaluation of abdominal aortic aneurysm  HPI: Albert Gonzalez is a 71 y.o. male, who is here today for evaluation of abdominal aortic aneurysm.  He had this diagnosis many years passes very small aneurysm.  He recently was diagnosed with colon cancer and recently underwent uneventful resection by Dr. Constance Haw approximately 3 weeks ago.  He looks incredibly good today.  He has recovered nicely.  He had a repeat CT scan around the time of his colon resection and was found to have a 4.5 cm infrarenal abdominal aortic aneurysm.  He is here today for further discussion.  He has no symptoms referable to his aneurysm  Past Medical History:  Diagnosis Date   Anxiety    Arthritis    Cancer ([place])    COPD (chronic obstructive pulmonary disease) ([facility])    Depression    History of kidney stones    Hyperlipidemia    Inguinal hernia right    Insomnia    Myalgia    PPD positive, treated 1979   1 year of INH   Raynaud phenomenon    Restless leg syndrome    Sarcoidosis 1980    Family History  Problem Relation Age of Onset   Suicidality Father    Colon cancer Neg Hx    Liver disease Neg Hx    Inflammatory bowel disease Neg Hx    Anesthesia problems Neg Hx     SOCIAL HISTORY: Social History   Socioeconomic History   Marital status: Married    Spouse name: Not on file   Number of children: 1   Years of education: Not on file   Highest education level: Not on file  Occupational History   Occupation: respiratory therapist    Employer: [employer]  Tobacco Use   Smoking status: Former    Packs/day: 0.50    Years: 20.00    Total pack years: 10.00    Types: Pipe, Cigarettes    Quit date: 04/03/1990    Years since quitting: 31.6   Smokeless tobacco: Never   Tobacco comments:    quit many years ago  Vaping Use   Vaping Use: Never used   Substance and Sexual Activity   Alcohol use: Not Currently    Comment: occassional   Drug use: No   Sexual activity: Yes  Other Topics Concern   Not on file  Social History Narrative   Not on file   Social Determinants of Health   Financial Resource Strain: Not on file  Food Insecurity: No Food Insecurity (11/02/2021)   Hunger Vital Sign    Worried About Running Out of Food in the Last Year: Never true    Ran Out of Food in the Last Year: Never true  Transportation Needs: No Transportation Needs (11/02/2021)   PRAPARE - Hydrologist (Medical): No    Lack of Transportation (Non-Medical): No  Physical Activity: Not on file  Stress: Not on file  Social Connections: Not on file  Intimate Partner Violence: Not At Risk (11/02/2021)   Humiliation, Afraid, Rape, and Kick questionnaire    Fear of Current or Ex-Partner: No    Emotionally Abused: No    Physically Abused: No    Sexually Abused: No    Allergies  Allergen Reactions   Sulfa Antibiotics Rash    Childhood     Current Outpatient Medications  Medication  Sig Dispense Refill   albuterol (VENTOLIN HFA) 108 (90 Base) MCG/ACT inhaler Inhale 2 puffs into the lungs 2 (two) times daily.     ALPRAZolam (XANAX) 0.5 MG tablet Take 1 tablet (0.5 mg total) by mouth daily as needed for anxiety. (Patient taking differently: Take 0.5 mg by mouth at bedtime.) 5 tablet 0   aspirin 325 MG tablet Take 325 mg by mouth daily.     atorvastatin (LIPITOR) 40 MG tablet TAKE ONE TABLET BY MOUTH EVERY NIGHT AT BEDTIME 90 tablet 0   busPIRone (BUSPAR) 10 MG tablet Take 10 mg by mouth 3 (three) times daily.     Calcium Citrate-Vitamin D (CALCIUM + D PO) Take 2 tablets by mouth daily.     diclofenac (VOLTAREN) 75 MG EC tablet Take 75 mg by mouth 2 (two) times daily.     DULoxetine (CYMBALTA) 60 MG capsule TAKE 1 (ONE) CAPSULE BY MOUTH TWO TIMES DAILY (Patient taking differently: Take 60 mg by mouth 2 (two) times daily.) 180  capsule 1   fluticasone-salmeterol (ADVAIR) 250-50 MCG/ACT AEPB Inhale 1 puff into the lungs in the morning and at bedtime.     hydrOXYzine (VISTARIL) 25 MG capsule Take 2 capsules (50 mg total) by mouth at bedtime. give 30-79mn before bedtime 30 capsule 0   ibuprofen (ADVIL) 200 MG tablet Take 800 mg by mouth daily.     Misc Natural Products (GLUCOSAMINE CHOND DOUBLE STR PO) Take 2 tablets by mouth daily.     Multiple Vitamin (MULTIVITAMIN) tablet Take 1 tablet by mouth daily. Men's 50+     polyethylene glycol (MIRALAX / GLYCOLAX) 17 g packet Take 17 g by mouth daily as needed for mild constipation. (Patient taking differently: Take 17 g by mouth daily.) 14 each 0   tiZANidine (ZANAFLEX) 4 MG tablet Take 1 tablet (4 mg total) by mouth daily. 30 tablet 0   ondansetron (ZOFRAN) 4 MG tablet Take 1 tablet (4 mg total) by mouth every 6 (six) hours as needed for nausea. 20 tablet 0   oxyCODONE (OXY IR/ROXICODONE) 5 MG immediate release tablet Take 1 tablet (5 mg total) by mouth every 4 (four) hours as needed for breakthrough pain or severe pain. 20 tablet 0   No current facility-administered medications for this visit.    REVIEW OF SYSTEMS:  '[X]'$  denotes positive finding, '[ ]'$  denotes negative finding Cardiac  Comments:  Chest pain or chest pressure:    Shortness of breath upon exertion:    Short of breath when lying flat:    Irregular heart rhythm:        Vascular    Pain in calf, thigh, or hip brought on by ambulation:    Pain in feet at night that wakes you up from your sleep:     Blood clot in your veins:    Leg swelling:         Pulmonary    Oxygen at home:    Productive cough:     Wheezing:         Neurologic    Sudden weakness in arms or legs:     Sudden numbness in arms or legs:     Sudden onset of difficulty speaking or slurred speech:    Temporary loss of vision in one eye:     Problems with dizziness:         Gastrointestinal    Blood in stool:     Vomited blood:  Genitourinary    Burning when urinating:     Blood in urine:        Psychiatric    Major depression:         Hematologic    Bleeding problems:    Problems with blood clotting too easily:        Skin    Rashes or ulcers:        Constitutional    Fever or chills:      PHYSICAL EXAM: Vitals:   11/16/21 1403  BP: 111/74  Pulse: 87  Resp: 18  SpO2: 96%  Weight: 162 lb 6.4 oz (73.7 kg)  Height: '5\' 11"'$  (1.803 m)    GENERAL: The patient is a well-nourished male, in no acute distress. The vital signs are documented above. CARDIOVASCULAR: 2+ radial pulses bilaterally.  I do not palpate an abdominal aortic aneurysm.  He has 2+ femoral and 2+ popliteal pulses with no evidence of peripheral aneurysm.  He has 2+ dorsalis pedis pulses bilaterally PULMONARY: There is good air exchange  MUSCULOSKELETAL: There are no major deformities or cyanosis. NEUROLOGIC: No focal weakness or paresthesias are detected. SKIN: There are no ulcers or rashes noted. PSYCHIATRIC: The patient has a normal affect.  DATA:  I reviewed his actual CT images with the patient.  This does show a 4.5 cm infrarenal abdominal aortic aneurysm.  This does not extend into the iliac arteries and he has a long normal caliber infrarenal aortic neck prior to initiation of the aneurysm  MEDICAL ISSUES: I discussed this at length with the patient.  He did have a CT scan 9 years ago showing a 2.5 cm dilatation of his aorta.  I did discuss symptoms of leaking aneurysm and he knows to report immediately to the emergency room should this occur.  I explained that the typical recommendation for repair would be 5.5 cm diameter or rapid growth.  I recommend that we see him again in 6 months with duplex of his aorta.  I did discuss open and stent graft repair.  He appears to be an excellent anatomic candidate for stent graft repair currently.   Rosetta Posner, MD FACS [facility] [phone] Pager [phone]  Note: Portions of this report may have been transcribed using voice recognition software.  Every effort has been made to ensure accuracy; however, inadvertent computerized transcription errors may still be present.

## 2021-11-16 NOTE — Progress Notes (Signed)
Please inform the patient to resume his iron supplements. His labs show iron deficiency anemia

## 2021-11-16 NOTE — Progress Notes (Unsigned)
[facility]  Future Appointments  Date Time Provider [place]  12/01/2021  1:30 PM Derek Jack, MD [facility] None  12/13/2021 11:45 AM Virl Cagey, MD RS-RS None  01/23/2022  1:00 PM [name], [place] [facility] [place]  02/16/2022  3:00 PM Carole Civil, MD [facility] None  09/04/2022  1:00 PM R[facility] NURSE [facility]

## 2021-11-16 NOTE — Patient Instructions (Addendum)
Will refer to Oncology.  Steristrips will peel off in the next 5-7 days. You can remove them once they are peeling off. It is ok to shower. Pat the area dry.   No heavy lifting > 10 lbs, excessive bending, pushing, pulling, or squatting for 6-8 weeks after surgery.   Diet as tolerated. Keep stools regular and soft.

## 2021-11-23 ENCOUNTER — Other Ambulatory Visit: Payer: Self-pay

## 2021-11-23 DIAGNOSIS — I7143 Infrarenal abdominal aortic aneurysm, without rupture: Secondary | ICD-10-CM

## 2021-11-24 ENCOUNTER — Other Ambulatory Visit: Payer: Self-pay | Admitting: Family Medicine

## 2021-12-01 ENCOUNTER — Encounter: Payer: Self-pay | Admitting: Hematology

## 2021-12-01 ENCOUNTER — Inpatient Hospital Stay: Payer: Medicare HMO | Attending: Hematology | Admitting: Hematology

## 2021-12-01 VITALS — BP 115/82 | HR 77 | Temp 98.1°F | Resp 18 | Ht 71.0 in | Wt 167.0 lb

## 2021-12-01 DIAGNOSIS — C182 Malignant neoplasm of ascending colon: Secondary | ICD-10-CM | POA: Diagnosis present

## 2021-12-01 DIAGNOSIS — C184 Malignant neoplasm of transverse colon: Secondary | ICD-10-CM

## 2021-12-01 DIAGNOSIS — D869 Sarcoidosis, unspecified: Secondary | ICD-10-CM | POA: Insufficient documentation

## 2021-12-01 DIAGNOSIS — D649 Anemia, unspecified: Secondary | ICD-10-CM | POA: Insufficient documentation

## 2021-12-01 NOTE — Progress Notes (Signed)
[facility] NOTE  Patient Care Team: [name], [place] as PCP - General (Family Medicine) Gala Romney, Cristopher Estimable, MD (Gastroenterology) Derek Jack, MD as Medical Oncologist (Medical Oncology) Brien Mates, RN as Oncology Nurse Navigator (Medical Oncology)  CHIEF COMPLAINTS/PURPOSE OF CONSULTATION:  Newly diagnosed right colon adenocarcinoma  HISTORY OF PRESENTING ILLNESS:  Albert Gonzalez 71 y.o. male is seen in consultation today at the request of Dr. Constance Haw for newly diagnosed right-sided colonic adenocarcinoma.  He developed pencil shaped dark stools for the past few months.  Colonoscopy on 09/28/2021 by Dr. Gala Romney showed exophytic apple core appearing neoplastic process in the proximal colon, location in relation to the hepatic flexure or cecum could not be ascertained with certainty.  CTAP with contrast on 10/12/2021 confirmed apple core lesion in the hepatic flexure without any evidence of metastatic disease in the abdomen or pelvis.  He underwent laparoscopic extended right hemicolectomy by Dr. Constance Haw on 11/02/2021.  He has recovered very well from surgery.  He is mildly constipated since surgery and is using laxative with stool softener and MiraLAX as needed.  He reports weight loss of about 10 pounds in the last 3 months.  He reportedly had a history of sarcoidosis in the past from a biopsy of the left axillary lymph node.  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  Non-smoker.  Maternal uncle had esophageal cancer.  MEDICAL HISTORY:  Past Medical History:  Diagnosis Date   Anxiety    Arthritis    Cancer ([place])    COPD (chronic obstructive pulmonary disease) ([place])    Depression    History of kidney stones    Hyperlipidemia    Inguinal hernia right    Insomnia    Myalgia    PPD positive, treated 1979   1 year of INH   Raynaud phenomenon    Restless leg syndrome    Sarcoidosis 1980    SURGICAL  HISTORY: Past Surgical History:  Procedure Laterality Date   AXILLARY LYMPH NODE BIOPSY Right 1980   BIOPSY  09/28/2021   Procedure: BIOPSY;  Surgeon: Daneil Dolin, MD;  Location: [facility];  Service: Endoscopy;;   COLON SURGERY     COLONOSCOPY  02/08/2011   Procedure: COLONOSCOPY;  Surgeon: Daneil Dolin, MD;  Location: [facility];  Service: Endoscopy;  Laterality: N/A;  1:30 PM   COLONOSCOPY WITH PROPOFOL N/A 09/28/2021   Procedure: COLONOSCOPY WITH PROPOFOL;  Surgeon: Daneil Dolin, MD;  Location[facility];  Service: Endoscopy;  Laterality: N/A;  2:15pm   INGUINAL HERNIA REPAIR Right 01/29/2017   Procedure: HERNIA REPAIR INGUINAL ADULT WITH MESH;  Surgeon: Virl Cagey, MD;  Location: [facility];  Service: General;  Laterality: Right;   KNEE ARTHROSCOPY WITH MEDIAL MENISECTOMY Right 04/05/2016   Procedure: RIGHT KNEE ARTHROSCOPY WITH PARTIAL  LATERAL MENISECTOMY, DEBRIDEMENT MEDIAL MENISCUS, ACL DEBRIDEMENT, MICRO FRACTURE OF FEMUR;  Surgeon: Carole Civil, MD;  Location: [facility];  Service: Orthopedics;  Laterality: Right;   KNEE SURGERY     left   KYPHOPLASTY     LAPAROSCOPIC PARTIAL COLECTOMY N/A 11/02/2021   Procedure: LAPAROSCOPIC EXTENDED RIGHT HEMICOLECTOMY;  Surgeon: Virl Cagey, MD;  Location: [facility];  Service: General;  Laterality: N/A;   POLYPECTOMY  09/28/2021   Procedure: POLYPECTOMY;  Surgeon: Daneil Dolin, MD;  [facility];  Service: Endoscopy;;   TONSILLECTOMY     TOTAL  KNEE ARTHROPLASTY Right 02/08/2021   Procedure: TOTAL KNEE ARTHROPLASTY;  Surgeon: Carole Civil, MD;  Location: [facility];  Service: Orthopedics;  Laterality: Right;    SOCIAL HISTORY: Social History   Socioeconomic History   Marital status: Married    Spouse name: Not on file   Number of children: 1   Years of education: Not on file   Highest education level: Not on file  Occupational History   Occupation: respiratory therapist    Employer:  [employer]  Tobacco Use   Smoking status: Former    Packs/day: 0.50    Years: 20.00    Total pack years: 10.00    Types: Pipe, Cigarettes    Quit date: 04/03/1990    Years since quitting: 31.6   Smokeless tobacco: Never   Tobacco comments:    quit many years ago  Vaping Use   Vaping Use: Never used  Substance and Sexual Activity   Alcohol use: Not Currently    Comment: occassional   Drug use: No   Sexual activity: Yes  Other Topics Concern   Not on file  Social History Narrative   Not on file   Social Determinants of Health   Financial Resource Strain: Not on file  Food Insecurity: No Food Insecurity (11/02/2021)   Hunger Vital Sign    Worried About Running Out of Food in the Last Year: Never true    Ran Out of Food in the Last Year: Never true  Transportation Needs: No Transportation Needs (11/02/2021)   PRAPARE - Hydrologist (Medical): No    Lack of Transportation (Non-Medical): No  Physical Activity: Not on file  Stress: Not on file  Social Connections: Not on file  Intimate Partner Violence: Not At Risk (11/02/2021)   Humiliation, Afraid, Rape, and Kick questionnaire    Fear of Current or Ex-Partner: No    Emotionally Abused: No    Physically Abused: No    Sexually Abused: No    FAMILY HISTORY: Family History  Problem Relation Age of Onset   Suicidality Father    Colon cancer Neg Hx    Liver disease Neg Hx    Inflammatory bowel disease Neg Hx    Anesthesia problems Neg Hx     ALLERGIES:  is allergic to sulfa antibiotics.  MEDICATIONS:  Current Outpatient Medications  Medication Sig Dispense Refill   albuterol (VENTOLIN HFA) 108 (90 Base) MCG/ACT inhaler Inhale 2 puffs into the lungs 2 (two) times daily.     ALPRAZolam (XANAX) 0.5 MG tablet Take 1 tablet (0.5 mg total) by mouth daily as needed for anxiety. (Patient taking differently: Take 0.5 mg by mouth at bedtime.) 5 tablet 0   aspirin 325 MG tablet Take 325  mg by mouth daily.     atorvastatin (LIPITOR) 40 MG tablet TAKE ONE TABLET BY MOUTH EVERY NIGHT AT BEDTIME 90 tablet 0   busPIRone (BUSPAR) 10 MG tablet Take 10 mg by mouth 3 (three) times daily.     Calcium Citrate-Vitamin D (CALCIUM + D PO) Take 2 tablets by mouth daily.     diclofenac (VOLTAREN) 75 MG EC tablet Take 75 mg by mouth 2 (two) times daily.     DULoxetine (CYMBALTA) 60 MG capsule TAKE 1 (ONE) CAPSULE BY MOUTH TWO TIMES DAILY (Patient taking differently: Take 60 mg by mouth 2 (two) times daily.) 180 capsule 1   fluticasone-salmeterol (ADVAIR) 250-50 MCG/ACT AEPB Inhale 1 puff into the lungs in  the morning and at bedtime.     hydrOXYzine (VISTARIL) 25 MG capsule Take 2 capsules (50 mg total) by mouth at bedtime. give 30-19mn before bedtime 30 capsule 0   ibuprofen (ADVIL) 200 MG tablet Take 800 mg by mouth daily.     Misc Natural Products (GLUCOSAMINE CHOND DOUBLE STR PO) Take 2 tablets by mouth daily.     Multiple Vitamin (MULTIVITAMIN) tablet Take 1 tablet by mouth daily. Men's 50+     polyethylene glycol (MIRALAX / GLYCOLAX) 17 g packet Take 17 g by mouth daily as needed for mild constipation. (Patient taking differently: Take 17 g by mouth daily.) 14 each 0   tiZANidine (ZANAFLEX) 4 MG tablet Take 1 tablet (4 mg total) by mouth daily. 30 tablet 0   No current facility-administered medications for this visit.    REVIEW OF SYSTEMS:   Constitutional: Denies fevers, chills or abnormal night sweats Eyes: Denies blurriness of vision, double vision or watery eyes Ears, nose, mouth, throat, and face: Denies mucositis or sore throat Respiratory: Denies cough, dyspnea or wheezes Cardiovascular: Denies palpitation, chest discomfort or lower extremity swelling Gastrointestinal: Positive for mild constipation since surgery. Skin: Denies abnormal skin rashes Lymphatics: Denies new lymphadenopathy or easy bruising Neurological:Denies numbness, tingling or new weaknesses Behavioral/Psych:  Mood is stable, no new changes.  Positive for sleep disturbance. All other systems were reviewed with the patient and are negative.  PHYSICAL EXAMINATION: ECOG PERFORMANCE STATUS: 1 - Symptomatic but completely ambulatory  Vitals:   12/01/21 1335  BP: 115/82  Pulse: 77  Resp: 18  Temp: 98.1 F (36.7 C)  SpO2: 95%   Filed Weights   12/01/21 1335  Weight: 167 lb (75.8 kg)    GENERAL:alert, no distress and comfortable SKIN: skin color, texture, turgor are normal, no rashes or significant lesions EYES: normal, conjunctiva are pink and non-injected, sclera clear OROPHARYNX:no exudate, no erythema and lips, buccal mucosa, and tongue normal  NECK: supple, thyroid normal size, non-tender, without nodularity LYMPH:  no palpable lymphadenopathy in the cervical, axillary or inguinal LUNGS: clear to auscultation and percussion with normal breathing effort HEART: regular rate & rhythm and no murmurs and no lower extremity edema ABDOMEN:abdomen soft, non-tender and normal bowel sounds.  Supraumbilical scar is well-healed. Musculoskeletal:no cyanosis of digits and no clubbing  PSYCH: alert & oriented x 3 with fluent speech NEURO: no focal motor/sensory deficits  LABORATORY DATA:  I have reviewed the data as listed Lab Results  Component Value Date   WBC 11.9 (H) 11/14/2021   HGB 10.8 (L) 11/14/2021   HCT 36.6 (L) 11/14/2021   MCV 82 11/14/2021   PLT 589 (H) 11/14/2021     Chemistry      Component Value Date/Time   NA 138 11/14/2021 1443   K 5.0 11/14/2021 1443   CL 100 11/14/2021 1443   CO2 24 11/14/2021 1443   BUN 14 11/14/2021 1443   CREATININE 0.90 11/14/2021 1443      Component Value Date/Time   CALCIUM 9.8 11/14/2021 1443   ALKPHOS 122 (H) 09/22/2021 1014   AST 22 09/22/2021 1014   ALT 17 09/22/2021 1014   BILITOT 0.2 09/22/2021 1014       RADIOGRAPHIC STUDIES: I have personally reviewed the radiological images as listed and agreed with the findings in the  report. No results found.  ASSESSMENT:  1.  Stage II (PT3 N0) ascending colon adenocarcinoma: - Colonoscopy (09/28/2021): Exophytic apple core appearing mass in the proximal colon.  Location in relation  to the hepatic flexure or cecum could not be ascertained with certainty.  Nonbleeding internal hemorrhoids. - CEA on 09/28/2021-2.8 - CT [facility] (10/12/2021): Apple core lesion in the hepatic flexure with no evidence of metastatic disease in the abdomen or pelvis. - Laparoscopic right hemicolectomy by Dr. Constance Haw on 11/02/2021 - Pathology: Moderately differentiated adenocarcinoma extending into pericolonic connective tissue, margins negative, 0/24 lymph nodes involved, negative LVI and PNI, MMR preserved. - Personal history of sarcoidosis from a left axillary lymph node biopsy prior to 2000  2.  Social/family history: - He lives at home with his wife.  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  He smoked a pipe briefly for less than a year but was never cigarette smoker.  Maternal uncle had esophageal cancer.   PLAN:  1.  Stage II (PT3N0) ascending colon adenocarcinoma: - We reviewed the pathology reports.  We reviewed prognosis of stage II cancer in detail. - Recommend CT scan of the chest with contrast to complete staging work-up. - I discussed surveillance plan with CEA, physical exam and other labs every 3 months and a CT [facility] with contrast every 6 months for the first 2 years followed by annual CT scan and every 17-monthfollow-up visits for a total of 5 years. - RTC after CT scan.   Orders Placed This Encounter  Procedures   CT Chest W Contrast    Standing Status:   Future    Standing Expiration Date:   12/01/2022    Order Specific Question:   If indicated for the ordered procedure, I authorize the administration of contrast media per Radiology protocol    Answer:   Yes    Order Specific Question:   [facility]    [facility]   Order Specific Question:   Release to patient    Answer:   Immediate    All questions were answered. The patient knows to call the clinic with any problems, questions or concerns.      SDerek Jack MD 12/01/2021 2:07 PM

## 2021-12-01 NOTE — Patient Instructions (Addendum)
[facility]  Discharge Instructions  You were seen and examined today by Dr. Delton Coombes. Dr. Delton Coombes is a medical oncologist, meaning that he specializes in the treatment of cancer diagnoses. Dr. Delton Coombes discussed your past medical history, family history of cancers, and the events that led to you being here today.  You were referred to Dr. Delton Coombes due to a new diagnosis of Colon Cancer. This was completely removed during surgery and there were no lymph nodes impacted by cancer. This is great news. This means that you have been diagnosed with a Stage II Colon Cancer.  There is no need for further treatment or chemotherapy.  Dr. Delton Coombes has recommended routine follow-up. First, we will do a CT scan of your chest to complete the work-up.  Dr. Constance Haw checked a CEA. This is a tumor marker specific to colon cancer. Yours was normal. Dr. Delton Coombes will continue to check this routinely with follow-up.  Follow-ups will be every 3 months with lab work and a CT scan every 6 months for the first two years. For an additional three years, you will have a follow-up every 6 months with labs and a CT scan every year. Cancer is considered cured once you reach 5 years cancer-free.  Follow-up as scheduled.  Thank you for choosing [facility] to provide your oncology and hematology care.   To afford each patient quality time with our provider, please arrive at least 15 minutes before your scheduled appointment time. You may need to reschedule your appointment if you arrive late (10 or more minutes). Arriving late affects you and other patients whose appointments are after yours.  Also, if you miss three or more appointments without notifying the office, you may be dismissed from the clinic at the provider's discretion.    Again, thank you for choosing [facility].  Our hope is that these requests will decrease the amount of time that  you wait before being seen by our physicians.   If you have a lab appointment with the [facility] please come in thru the Main Entrance and check in at the main information desk.           _____________________________________________________________  Should you have questions after your visit to [facility], please contact our office at [phone] and follow the prompts.  Our office hours are 8:00 a.m. to 4:30 p.m. Monday - Thursday and 8:00 a.m. to 2:30 p.m. Friday.  Please note that voicemails left after 4:00 p.m. may not be returned until the following business day.  We are closed weekends and all major holidays.  You do have access to a nurse 24-7, just call the main number to the clinic [phone] and do not press any options, hold on the line and a nurse will answer the phone.    For prescription refill requests, have your pharmacy contact our office and allow 72 hours.    Masks are optional in the cancer centers. If you would like for your care team to wear a mask while they are taking care of you, please let them know. You may have one support person who is at least 72 years old accompany you for your appointments.

## 2021-12-12 ENCOUNTER — Other Ambulatory Visit: Payer: Self-pay | Admitting: Family Medicine

## 2021-12-12 DIAGNOSIS — M545 Low back pain, unspecified: Secondary | ICD-10-CM

## 2021-12-13 ENCOUNTER — Encounter: Payer: Self-pay | Admitting: General Surgery

## 2021-12-13 ENCOUNTER — Ambulatory Visit (INDEPENDENT_AMBULATORY_CARE_PROVIDER_SITE_OTHER): Payer: Medicare HMO | Admitting: General Surgery

## 2021-12-13 VITALS — BP 145/74 | HR 81 | Temp 98.2°F | Resp 12 | Ht 71.0 in | Wt 168.0 lb

## 2021-12-13 DIAGNOSIS — D490 Neoplasm of unspecified behavior of digestive system: Secondary | ICD-10-CM

## 2021-12-13 NOTE — Patient Instructions (Addendum)
Diet and activity as tolerated.  Follow up with Dr. Delton Coombes Call with issues.

## 2021-12-13 NOTE — Progress Notes (Signed)
[facility] Note   HPI:  71 y.o. Male presents to clinic for post-op follow-up evaluation after a laparoscopic right hemicolectomy. Patient reports he is doing well and eating and having no pain. He has seen Oncology and is scheduled for a CT chest tomorrow.   Review of Systems:  Regular Bms Eating well  All other review of systems: otherwise negative   Vital Signs:  BP (!) 145/74   Pulse 81   Temp 98.2 F (36.8 C) (Oral)   Resp 12   Ht '5\' 11"'$  (1.803 m)   Wt 168 lb (76.2 kg)   SpO2 94%   BMI 23.43 kg/m    Physical Exam:  Physical Exam Vitals reviewed.  Cardiovascular:     Rate and Rhythm: Normal rate.  Pulmonary:     Effort: Pulmonary effort is normal.  Abdominal:     General: There is no distension.     Palpations: Abdomen is soft.     Tenderness: There is no abdominal tenderness.     Hernia: No hernia is present.     Comments: Sites healing, no erythema or drainage       Assessment:  71 y.o. yo Male s/p laparoscopic right hemicolectomy for cancer. Doing well.  Plan:  Diet and activity as tolerated.  Follow up with Dr. Delton Coombes Call with issues.   PRN follow up    Curlene Labrum, MD [facility] [address] [place], [place] [phone] (office)

## 2021-12-14 ENCOUNTER — Ambulatory Visit (HOSPITAL_COMMUNITY)
Admission: RE | Admit: 2021-12-14 | Discharge: 2021-12-14 | Disposition: A | Discharge status: Home / Self Care | Payer: Medicare HMO | Source: Ambulatory Visit | Attending: Hematology | Admitting: Hematology

## 2021-12-14 DIAGNOSIS — C184 Malignant neoplasm of transverse colon: Secondary | ICD-10-CM | POA: Insufficient documentation

## 2021-12-14 MED ORDER — IOHEXOL 300 MG/ML  SOLN
100.0000 mL | Freq: Once | INTRAMUSCULAR | Status: AC | PRN
  Administered 2021-12-14: 75 mL via INTRAVENOUS

## 2021-12-14 NOTE — Telephone Encounter (Signed)
Only refill buspar, advair and tizanidine

## 2021-12-15 ENCOUNTER — Telehealth: Payer: Self-pay | Admitting: Pulmonary Disease

## 2021-12-15 MED ORDER — ALBUTEROL SULFATE HFA 108 (90 BASE) MCG/ACT IN AERS: INHALATION_SPRAY | RESPIRATORY_TRACT | 1 refills | Status: DC

## 2021-12-15 MED ORDER — FLUTICASONE-SALMETEROL 250-50 MCG/ACT IN AEPB: 1.0000 | INHALATION_SPRAY | Freq: Two times a day (BID) | RESPIRATORY_TRACT | 1 refills | Status: DC

## 2021-12-15 NOTE — Telephone Encounter (Signed)
Called and spoke with patient. Patient verified pharmacy. Prescriptions have been sent to the pharmacy.   Nothing further needed.

## 2021-12-19 MED ORDER — TIZANIDINE HCL 4 MG PO TABS: 4.0000 mg | ORAL_TABLET | Freq: Every day | ORAL | 0 refills | Status: DC

## 2021-12-19 MED ORDER — BUSPIRONE HCL 10 MG PO TABS: 10.0000 mg | ORAL_TABLET | Freq: Three times a day (TID) | ORAL | 1 refills | Status: DC

## 2021-12-21 ENCOUNTER — Inpatient Hospital Stay: Payer: Medicare HMO | Admitting: Hematology

## 2021-12-21 ENCOUNTER — Encounter: Payer: Self-pay | Admitting: Hematology

## 2021-12-21 VITALS — BP 132/80 | HR 92 | Temp 98.3°F | Resp 16 | Wt 169.0 lb

## 2021-12-21 DIAGNOSIS — C182 Malignant neoplasm of ascending colon: Secondary | ICD-10-CM | POA: Diagnosis not present

## 2021-12-21 DIAGNOSIS — C184 Malignant neoplasm of transverse colon: Secondary | ICD-10-CM

## 2021-12-21 NOTE — Progress Notes (Signed)
Albert Gonzalez, [name]   CLINIC:  Medical Oncology/Hematology  PCP:  [name], [facility] [address] [place] [address] [phone]   REASON FOR VISIT:  Follow-up for stage II right colon cancer  PRIOR THERAPY: Right hemicolectomy on 11/02/2021  NGS Results: MMR preserved  CURRENT THERAPY: Surveillance  BRIEF ONCOLOGIC HISTORY:  Oncology History   No history exists.    CANCER STAGING: Cancer Staging  No matching staging information was found for the patient.   INTERVAL HISTORY:  Albert Gonzalez 71 y.o. male seen for follow-up of colon cancer.  He had CT scan of the chest done.  Denies any shortness of breath on exertion or dry cough.  Energy levels are reported 50%.   REVIEW OF SYSTEMS:  Review of Systems  All other systems reviewed and are negative.    PAST MEDICAL/SURGICAL HISTORY:  Past Medical History:  Diagnosis Date   Anxiety    Arthritis    Cancer ([place])    COPD (chronic obstructive pulmonary disease) ([place])    Depression    History of kidney stones    Hyperlipidemia    Inguinal hernia right    Insomnia    Myalgia    PPD positive, treated 1979   1 year of INH   Raynaud phenomenon    Restless leg syndrome    Sarcoidosis 1980   Past Surgical History:  Procedure Laterality Date   AXILLARY LYMPH NODE BIOPSY Right 1980   BIOPSY  09/28/2021   Procedure: BIOPSY;  Surgeon: Daneil Dolin, MD;  [facility];  Service: Endoscopy;;   COLON SURGERY     COLONOSCOPY  02/08/2011   Procedure: COLONOSCOPY;  Surgeon: Daneil Dolin, MD;  [facility];  Service: Endoscopy;  Laterality: N/A;  1:30 PM   COLONOSCOPY WITH PROPOFOL N/A 09/28/2021   Procedure: COLONOSCOPY WITH PROPOFOL;  Surgeon: Daneil Dolin, MD;  [facility];  Service: Endoscopy;  Laterality: N/A;  2:15pm   INGUINAL HERNIA REPAIR Right 01/29/2017   Procedure: HERNIA REPAIR INGUINAL ADULT WITH MESH;  Surgeon:  Virl Cagey, MD;  Location: [facility];  Service: General;  Laterality: Right;   KNEE ARTHROSCOPY WITH MEDIAL MENISECTOMY Right 04/05/2016   Procedure: RIGHT KNEE ARTHROSCOPY WITH PARTIAL  LATERAL MENISECTOMY, DEBRIDEMENT MEDIAL MENISCUS, ACL DEBRIDEMENT, MICRO FRACTURE OF FEMUR;  Surgeon: Carole Civil, MD;  Location: [facility];  Service: Orthopedics;  Laterality: Right;   KNEE SURGERY     left   KYPHOPLASTY     LAPAROSCOPIC PARTIAL COLECTOMY N/A 11/02/2021   Procedure: LAPAROSCOPIC EXTENDED RIGHT HEMICOLECTOMY;  Surgeon: Virl Cagey, MD;  Location: [facility];  Service: General;  Laterality: N/A;   POLYPECTOMY  09/28/2021   Procedure: POLYPECTOMY;  Surgeon: Daneil Dolin, MD;  [facility];  Service: Endoscopy;;   TONSILLECTOMY     TOTAL KNEE ARTHROPLASTY Right 02/08/2021   Procedure: TOTAL KNEE ARTHROPLASTY;  Surgeon: Carole Civil, MD;  Location: [facility];  Service: Orthopedics;  Laterality: Right;     SOCIAL HISTORY:  Social History   Socioeconomic History   Marital status: Married    Spouse name: Not on file   Number of children: 1   Years of education: Not on file   Highest education level: Not on file  Occupational History   Occupation: respiratory therapist    Employer: [employer]  Tobacco Use   Smoking status: Former  Packs/day: 0.50    Years: 20.00    Total pack years: 10.00    Types: Pipe, Cigarettes    Quit date: 04/03/1990    Years since quitting: 31.7   Smokeless tobacco: Never   Tobacco comments:    quit many years ago  Vaping Use   Vaping Use: Never used  Substance and Sexual Activity   Alcohol use: Not Currently    Comment: occassional   Drug use: No   Sexual activity: Yes  Other Topics Concern   Not on file  Social History Narrative   Not on file   Social Determinants of Health   Financial Resource Strain: Not on file  Food Insecurity: No Food Insecurity (11/02/2021)   Hunger Vital Sign    Worried  About Running Out of Food in the Last Year: Never true    Ran Out of Food in the Last Year: Never true  Transportation Needs: No Transportation Needs (11/02/2021)   PRAPARE - Hydrologist (Medical): No    Lack of Transportation (Non-Medical): No  Physical Activity: Not on file  Stress: Not on file  Social Connections: Not on file  Intimate Partner Violence: Not At Risk (11/02/2021)   Humiliation, Afraid, Rape, and Kick questionnaire    Fear of Current or Ex-Partner: No    Emotionally Abused: No    Physically Abused: No    Sexually Abused: No    FAMILY HISTORY:  Family History  Problem Relation Age of Onset   Suicidality Father    Colon cancer Neg Hx    Liver disease Neg Hx    Inflammatory bowel disease Neg Hx    Anesthesia problems Neg Hx     CURRENT MEDICATIONS:  Outpatient Encounter Medications as of 12/21/2021  Medication Sig Note   albuterol (VENTOLIN HFA) 108 (90 Base) MCG/ACT inhaler INHALE ONE PUFF INTO THE LUNGS FOUR TIMES DAILY AS NEEDED    ALPRAZolam (XANAX) 0.5 MG tablet Take 1 tablet (0.5 mg total) by mouth daily as needed for anxiety. (Patient taking differently: Take 0.5 mg by mouth at bedtime.)    aspirin 325 MG tablet Take 325 mg by mouth daily.    atorvastatin (LIPITOR) 40 MG tablet TAKE ONE TABLET BY MOUTH EVERY NIGHT AT BEDTIME    busPIRone (BUSPAR) 10 MG tablet TAKE ONE (1) TABLET BY MOUTH 3 TIMES DAILY    busPIRone (BUSPAR) 10 MG tablet Take 1 tablet (10 mg total) by mouth 3 (three) times daily.    Calcium Citrate-Vitamin D (CALCIUM + D PO) Take 2 tablets by mouth daily.    diclofenac (VOLTAREN) 75 MG EC tablet Take 75 mg by mouth 2 (two) times daily. 11/01/2021: Last filled 05/12/2021 75 MG TBEC (disp 60, 30d supply)    DULoxetine (CYMBALTA) 60 MG capsule TAKE 1 (ONE) CAPSULE BY MOUTH TWO TIMES DAILY (Patient taking differently: Take 60 mg by mouth 2 (two) times daily.)    fluticasone-salmeterol (ADVAIR) 250-50 MCG/ACT AEPB Inhale 1  puff into the lungs in the morning and at bedtime.    ibuprofen (ADVIL) 200 MG tablet Take 800 mg by mouth daily.    Misc Natural Products (GLUCOSAMINE CHOND DOUBLE STR PO) Take 2 tablets by mouth daily.    Multiple Vitamin (MULTIVITAMIN) tablet Take 1 tablet by mouth daily. Men's 50+    polyethylene glycol (MIRALAX / GLYCOLAX) 17 g packet Take 17 g by mouth daily as needed for mild constipation. (Patient taking differently: Take 17 g by mouth daily.)  tiZANidine (ZANAFLEX) 4 MG tablet Take 1 tablet (4 mg total) by mouth daily.    No facility-administered encounter medications on file as of 12/21/2021.    ALLERGIES:  Allergies  Allergen Reactions   Sulfa Antibiotics Rash    Childhood      PHYSICAL EXAM:  ECOG Performance status: 1  Vitals:   12/21/21 1410  BP: 132/80  Pulse: 92  Resp: 16  Temp: 98.3 F (36.8 C)  SpO2: 95%   Filed Weights   12/21/21 1410  Weight: 169 lb (76.7 kg)   Physical Exam Vitals reviewed.  Constitutional:      Appearance: Normal appearance.  Cardiovascular:     Rate and Rhythm: Normal rate and regular rhythm.     Heart sounds: Normal heart sounds.  Pulmonary:     Breath sounds: Normal breath sounds.  Abdominal:     Palpations: Abdomen is soft. There is no mass.  Neurological:     Mental Status: He is alert.  Psychiatric:        Mood and Affect: Mood normal.        Behavior: Behavior normal.     LABORATORY DATA:  I have reviewed the labs as listed.  CBC    Component Value Date/Time   WBC 11.9 (H) 11/14/2021 1443   WBC 10.6 (H) 11/05/2021 0542   RBC 4.45 11/14/2021 1443   RBC 3.57 (L) 11/05/2021 0542   HGB 10.8 (L) 11/14/2021 1443   HCT 36.6 (L) 11/14/2021 1443   PLT 589 (H) 11/14/2021 1443   MCV 82 11/14/2021 1443   MCH 24.3 (L) 11/14/2021 1443   MCH 25.5 (L) 11/05/2021 0542   MCHC 29.5 (L) 11/14/2021 1443   MCHC 31.6 11/05/2021 0542   RDW 16.0 (H) 11/14/2021 1443   LYMPHSABS 1.5 11/14/2021 1443   MONOABS 0.7 11/05/2021  0542   EOSABS 0.5 (H) 11/14/2021 1443   BASOSABS 0.1 11/14/2021 1443      Latest Ref Rng & Units 11/14/2021    2:43 PM 11/05/2021    5:42 AM 11/04/2021    5:02 AM  CMP  Glucose 70 - 99 mg/dL 91  93  92   BUN 8 - 27 mg/dL _0 Creatinine 0.76 - 1.27 mg/dL 0.90  0.71  0.77   Sodium 134 - 144 mmol/L 138  140  142   Potassium 3.5 - 5.2 mmol/L 5.0  3.7  3.6   Chloride 96 - 106 mmol/L 100  109  110   CO2 20 - 29 mmol/L _1 Calcium 8.6 - 10.2 mg/dL 9.8  8.1  8.1     DIAGNOSTIC IMAGING:  I have independently reviewed the scans and discussed with the patient.  ASSESSMENT:  1.  Stage II (PT3 N0) ascending colon adenocarcinoma: - Colonoscopy (09/28/2021): Exophytic apple core appearing mass in the proximal colon.  Location in relation to the hepatic flexure or cecum could not be ascertained with certainty.  Nonbleeding internal hemorrhoids. - CEA on 09/28/2021-2.8 - CT [facility] (10/12/2021): Apple core lesion in the hepatic flexure with no evidence of metastatic disease in the abdomen or pelvis. - Laparoscopic right hemicolectomy by Dr. Constance Haw on 11/02/2021 - Pathology: Moderately differentiated adenocarcinoma extending into pericolonic connective tissue, margins negative, 0/24 lymph nodes involved, negative LVI and PNI, MMR preserved. - Personal history of sarcoidosis from a left axillary lymph node biopsy prior to 2000   2.  Social/family history: - He lives at home with  his wife.  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  He smoked a pipe briefly for less than a year but was never cigarette smoker.  Maternal uncle had esophageal cancer.  PLAN:   1.  Stage II (PT3N0) ascending colon adenocarcinoma: - CT chest (12/14/2021): No evidence of metastatic disease within the thorax.  Stable chronic interstitial lung disease and pulmonary fibrosis. - He will follow-up with Dr. Halford Chessman for interstitial lung disease. - Discussed surveillance plan with CEA and  labs every 3 months and CT scan [facility] every 6 months for the first 2 years. - RTC 3 months with repeat labs and CEA.  2.  Normocytic anemia: - Continue iron tablet twice daily. - We will plan to check ferritin and iron panel at next visit.    Orders placed this encounter:  No orders of the defined types were placed in this encounter.     Derek Jack, MD [facility] [phone]

## 2021-12-21 NOTE — Patient Instructions (Signed)
[facility]  Discharge Instructions  You were seen and examined today by Dr. Delton Coombes.  Dr. Delton Coombes discussed your most recent lab work and CT scan which revealed that everything looks stable.  Follow-up as scheduled in 3 months.    Thank you for choosing [facility] to provide your oncology and hematology care.   To afford each patient quality time with our provider, please arrive at least 15 minutes before your scheduled appointment time. You may need to reschedule your appointment if you arrive late (10 or more minutes). Arriving late affects you and other patients whose appointments are after yours.  Also, if you miss three or more appointments without notifying the office, you may be dismissed from the clinic at the provider's discretion.    Again, thank you for choosing [facility].  Our hope is that these requests will decrease the amount of time that you wait before being seen by our physicians.   If you have a lab appointment with the [facility] please come in thru the Main Entrance and check in at the main information desk.           _____________________________________________________________  Should you have questions after your visit to [facility], please contact our office at [phone] and follow the prompts.  Our office hours are 8:00 a.m. to 4:30 p.m. Monday - Thursday and 8:00 a.m. to 2:30 p.m. Friday.  Please note that voicemails left after 4:00 p.m. may not be returned until the following business day.  We are closed weekends and all major holidays.  You do have access to a nurse 24-7, just call the main number to the clinic [phone] and do not press any options, hold on the line and a nurse will answer the phone.    For prescription refill requests, have your pharmacy contact our office and allow 72 hours.    Masks are optional in the cancer centers. If you would like for  your care team to wear a mask while they are taking care of you, please let them know. You may have one support person who is at least 72 years old accompany you for your appointments.

## 2022-01-23 ENCOUNTER — Encounter: Payer: Self-pay | Admitting: Family Medicine

## 2022-01-23 ENCOUNTER — Ambulatory Visit (INDEPENDENT_AMBULATORY_CARE_PROVIDER_SITE_OTHER): Payer: Medicare HMO | Admitting: Family Medicine

## 2022-01-23 VITALS — BP 126/78 | HR 87 | Ht 71.0 in | Wt 174.0 lb

## 2022-01-23 DIAGNOSIS — Z96651 Presence of right artificial knee joint: Secondary | ICD-10-CM | POA: Diagnosis not present

## 2022-01-23 DIAGNOSIS — M545 Low back pain, unspecified: Secondary | ICD-10-CM

## 2022-01-23 DIAGNOSIS — F419 Anxiety disorder, unspecified: Secondary | ICD-10-CM | POA: Diagnosis not present

## 2022-01-23 DIAGNOSIS — Z0001 Encounter for general adult medical examination with abnormal findings: Secondary | ICD-10-CM | POA: Insufficient documentation

## 2022-01-23 MED ORDER — ALPRAZOLAM 0.5 MG PO TABS: 0.5000 mg | ORAL_TABLET | Freq: Every day | ORAL | 0 refills | Status: DC | PRN

## 2022-01-23 MED ORDER — TIZANIDINE HCL 4 MG PO TABS: 4.0000 mg | ORAL_TABLET | Freq: Every day | ORAL | 1 refills | Status: DC

## 2022-01-23 MED ORDER — ALPRAZOLAM 0.25 MG PO TABS: 0.2500 mg | ORAL_TABLET | Freq: Two times a day (BID) | ORAL | 0 refills | Status: DC | PRN

## 2022-01-23 NOTE — Assessment & Plan Note (Signed)
Patient requested a refill of his muscle relaxant Zanaflex 4 mg refilled and sent to the pharmacy

## 2022-01-23 NOTE — Assessment & Plan Note (Signed)
He denies suicidal ideation homicidal ideation He requested a refill of his Xanax Refill sent to his pharmacy

## 2022-01-23 NOTE — Patient Instructions (Signed)
I appreciate the opportunity to provide care to you today!    Follow up:  2 months  Labs:  next visit  Please pick up your medications at the pharmacy   Please continue to a heart-healthy diet and increase your physical activities. Try to exercise for 41mns at least three times a week.      It was a pleasure to see you and I look forward to continuing to work together on your health and well-being. Please do not hesitate to call the office if you need care or have questions about your care.   Have a wonderful day and week. With Gratitude, GAlvira MondayMSN, FNP-BC

## 2022-01-23 NOTE — Assessment & Plan Note (Signed)
Physical exam as documented Counseling is done on healthy lifestyle involving commitment to 150 minutes of exercise per week,  Discussed heart-healthy diet  The patient is up-to-date with his required immunization

## 2022-01-23 NOTE — Progress Notes (Signed)
Complete physical exam  Patient: Albert Gonzalez   DOB: 1951-02-09   71 y.o. Male  MRN: [id number]  Subjective:    Chief Complaint  Patient presents with   Annual Exam    Cpe today. Pt would like a refill on tizanidine for his knee pain, and xanax for anxiety.     Albert Gonzalez is a 71 y.o. male who presents today for a complete physical exam. He reports consuming a general diet.  He reports walking daily at home, and has recently purchased a stationary bicycle that he plans on using.  He generally feels well. He reports sleeping well. He does not have additional problems to discuss today.   He requested a refills of  his Xanax and tizanidine. Most recent fall risk assessment:    01/23/2022    1:09 PM  [place] in the past year? 0  Number falls in past yr: 0  Injury with Fall? 0  Risk for fall due to : No Fall Risks  Follow up Falls evaluation completed     Most recent depression screenings:    01/23/2022    1:09 PM 11/14/2021    1:54 PM  PHQ 2/9 Scores  PHQ - 2 Score 0 0  PHQ- 9 Score 2     Vision:Not within last year   Patient Active Problem List   Diagnosis Date Noted   Encounter for general adult medical examination with abnormal findings 01/23/2022   S/P right hemicolectomy 11/15/2021   Need for immunization against influenza 11/15/2021   Colon cancer ([place]) 11/02/2021   Aortic atherosclerosis ([place]) 10/26/2021   Neoplasm of hepatic flexure of colon 10/17/2021   Abdominal aortic aneurysm (AAA) 3.0 cm to 5.5 cm in diameter in male ([place]) 09/21/2021   Snoring 07/04/2021   Asthma 07/04/2021   Bilateral low back pain 06/23/2021   S/P total knee replacement, right 02/08/21 02/22/2021   Osteoarthritis of right knee 02/08/2021   Sepsis due to pneumonia ([place]) 09/09/2020   Depression    Anxiety    Hyperlipidemia    Arthritis    Sarcoidosis    Right inguinal hernia    MDD (major depressive disorder), recurrent episode, moderate ([place]) 08/24/2016   Acute  medial meniscus tear of left knee    Tear of lateral meniscus of right knee, current    Primary osteoarthritis of right knee    Deficiency of anterior cruciate ligament of right knee    Abnormal CT scan, colon 02/01/2011   Constipation 02/01/2011   Past Medical History:  Diagnosis Date   Anxiety    Arthritis    Cancer ([place])    COPD (chronic obstructive pulmonary disease) ([place])    Depression    History of kidney stones    Hyperlipidemia    Inguinal hernia right    Insomnia    Myalgia    PPD positive, treated 1979   1 year of INH   Raynaud phenomenon    Restless leg syndrome    Sarcoidosis 1980   Past Surgical History:  Procedure Laterality Date   AXILLARY LYMPH NODE BIOPSY Right 1980   BIOPSY  09/28/2021   Procedure: BIOPSY;  Surgeon: Daneil Dolin, MD;  [facility];  Service: Endoscopy;;   COLON SURGERY     COLONOSCOPY  02/08/2011   Procedure: COLONOSCOPY;  Surgeon: Daneil Dolin, MD;  [facility];  Service: Endoscopy;  Laterality: N/A;  1:30 PM   COLONOSCOPY WITH PROPOFOL  N/A 09/28/2021   Procedure: COLONOSCOPY WITH PROPOFOL;  Surgeon: Daneil Dolin, MD;  [facility];  Service: Endoscopy;  Laterality: N/A;  2:15pm   INGUINAL HERNIA REPAIR Right 01/29/2017   Procedure: HERNIA REPAIR INGUINAL ADULT WITH MESH;  Surgeon: Virl Cagey, MD;  Location: [facility];  Service: General;  Laterality: Right;   KNEE ARTHROSCOPY WITH MEDIAL MENISECTOMY Right 04/05/2016   Procedure: RIGHT KNEE ARTHROSCOPY WITH PARTIAL  LATERAL MENISECTOMY, DEBRIDEMENT MEDIAL MENISCUS, ACL DEBRIDEMENT, MICRO FRACTURE OF FEMUR;  Surgeon: Carole Civil, MD;  Location: [facility];  Service: Orthopedics;  Laterality: Right;   KNEE SURGERY     left   KYPHOPLASTY     LAPAROSCOPIC PARTIAL COLECTOMY N/A 11/02/2021   Procedure: LAPAROSCOPIC EXTENDED RIGHT HEMICOLECTOMY;  Surgeon: Virl Cagey, MD;  Location: [facility];  Service: General;  Laterality: N/A;    POLYPECTOMY  09/28/2021   Procedure: POLYPECTOMY;  Surgeon: Daneil Dolin, MD;  [facility];  Service: Endoscopy;;   TONSILLECTOMY     TOTAL KNEE ARTHROPLASTY Right 02/08/2021   Procedure: TOTAL KNEE ARTHROPLASTY;  Surgeon: Carole Civil, MD;  Location: [facility];  Service: Orthopedics;  Laterality: Right;   Social History   Tobacco Use   Smoking status: Former    Packs/day: 0.50    Years: 20.00    Total pack years: 10.00    Types: Pipe, Cigarettes    Quit date: 04/03/1990    Years since quitting: 31.8   Smokeless tobacco: Never   Tobacco comments:    quit many years ago  Vaping Use   Vaping Use: Never used  Substance Use Topics   Alcohol use: Not Currently    Comment: occassional   Drug use: No   Social History   Socioeconomic History   Marital status: Married    Spouse name: Not on file   Number of children: 1   Years of education: Not on file   Highest education level: Not on file  Occupational History   Occupation: respiratory therapist    Employer: [employer]  Tobacco Use   Smoking status: Former    Packs/day: 0.50    Years: 20.00    Total pack years: 10.00    Types: Pipe, Cigarettes    Quit date: 04/03/1990    Years since quitting: 31.8   Smokeless tobacco: Never   Tobacco comments:    quit many years ago  Vaping Use   Vaping Use: Never used  Substance and Sexual Activity   Alcohol use: Not Currently    Comment: occassional   Drug use: No   Sexual activity: Yes  Other Topics Concern   Not on file  Social History Narrative   Not on file   Social Determinants of Health   Financial Resource Strain: Not on file  Food Insecurity: No Food Insecurity (11/02/2021)   Hunger Vital Sign    Worried About Running Out of Food in the Last Year: Never true    [place] in the Last Year: Never true  Transportation Needs: No Transportation Needs (11/02/2021)   PRAPARE - Hydrologist (Medical): No     Lack of Transportation (Non-Medical): No  Physical Activity: Not on file  Stress: Not on file  Social Connections: Not on file  Intimate Partner Violence: Not At Risk (11/02/2021)   Humiliation, Afraid, Rape, and Kick questionnaire    Fear of Current or Ex-Partner: No  Emotionally Abused: No    Physically Abused: No    Sexually Abused: No   Family Status  Relation Name Status   Father  (Not Specified)   Neg Hx  (Not Specified)   Family History  Problem Relation Age of Onset   Suicidality Father    Colon cancer Neg Hx    Liver disease Neg Hx    Inflammatory bowel disease Neg Hx    Anesthesia problems Neg Hx       Patient Care Team: [name], [place] as PCP - General (Family Medicine) Gala Romney, Cristopher Estimable, MD (Gastroenterology) Derek Jack, MD as Medical Oncologist (Medical Oncology) Brien Mates, RN as Oncology Nurse Navigator (Medical Oncology)   Outpatient Medications Prior to Visit  Medication Sig   albuterol (VENTOLIN HFA) 108 (90 Base) MCG/ACT inhaler INHALE ONE PUFF INTO THE LUNGS FOUR TIMES DAILY AS NEEDED   aspirin 325 MG tablet Take 325 mg by mouth daily.   atorvastatin (LIPITOR) 40 MG tablet TAKE ONE TABLET BY MOUTH EVERY NIGHT AT BEDTIME   busPIRone (BUSPAR) 10 MG tablet TAKE ONE (1) TABLET BY MOUTH 3 TIMES DAILY   busPIRone (BUSPAR) 10 MG tablet Take 1 tablet (10 mg total) by mouth 3 (three) times daily.   Calcium Citrate-Vitamin D (CALCIUM + D PO) Take 2 tablets by mouth daily.   diclofenac (VOLTAREN) 75 MG EC tablet Take 75 mg by mouth 2 (two) times daily.   DULoxetine (CYMBALTA) 60 MG capsule TAKE 1 (ONE) CAPSULE BY MOUTH TWO TIMES DAILY (Patient taking differently: Take 60 mg by mouth 2 (two) times daily.)   fluticasone-salmeterol (ADVAIR) 250-50 MCG/ACT AEPB Inhale 1 puff into the lungs in the morning and at bedtime.   ibuprofen (ADVIL) 200 MG tablet Take 800 mg by mouth daily.   Misc Natural Products (GLUCOSAMINE CHOND DOUBLE STR PO) Take 2  tablets by mouth daily.   Multiple Vitamin (MULTIVITAMIN) tablet Take 1 tablet by mouth daily. Men's 50+   polyethylene glycol (MIRALAX / GLYCOLAX) 17 g packet Take 17 g by mouth daily as needed for mild constipation. (Patient taking differently: Take 17 g by mouth daily.)   [DISCONTINUED] ALPRAZolam (XANAX) 0.5 MG tablet Take 1 tablet (0.5 mg total) by mouth daily as needed for anxiety. (Patient taking differently: Take 0.5 mg by mouth at bedtime.)   [DISCONTINUED] tiZANidine (ZANAFLEX) 4 MG tablet Take 1 tablet (4 mg total) by mouth daily.   No facility-administered medications prior to visit.    Review of Systems  Constitutional:  Negative for chills and fever.  HENT:  Negative for congestion and sinus pain.   Eyes:  Negative for redness.  Respiratory:  Negative for cough and shortness of breath.   Cardiovascular:  Negative for chest pain and palpitations.  Gastrointestinal:  Negative for constipation, nausea and vomiting.  Genitourinary:  Negative for frequency and urgency.  Neurological:  Negative for dizziness and headaches.       Objective:    BP 126/78   Pulse 87   Ht _0  (1.803 m)   Wt 174 lb 0.6 oz (78.9 kg)   SpO2 100%   BMI 24.27 kg/m  BP Readings from Last 3 Encounters:  01/23/22 126/78  12/21/21 132/80  12/13/21 (!) 145/74   Wt Readings from Last 3 Encounters:  01/23/22 174 lb 0.6 oz (78.9 kg)  12/21/21 169 lb (76.7 kg)  12/13/21 168 lb (76.2 kg)      Physical Exam HENT:     Head: Normocephalic.  Right Ear: External ear normal.     Left Ear: External ear normal.     Nose: No congestion.  Eyes:     Extraocular Movements: Extraocular movements intact.     Pupils: Pupils are equal, round, and reactive to light.  Cardiovascular:     Rate and Rhythm: Normal rate and regular rhythm.     Pulses: Normal pulses.     Heart sounds: Normal heart sounds.  Pulmonary:     Effort: Pulmonary effort is normal.     Breath sounds: Normal breath sounds.   Abdominal:     Palpations: Abdomen is soft.     Tenderness: There is no right CVA tenderness or left CVA tenderness.  Musculoskeletal:     Cervical back: No rigidity.     Right lower leg: No edema.     Left lower leg: No edema.  Skin:    Findings: No bruising or erythema.  Neurological:     Mental Status: He is alert and oriented to person, place, and time.     GCS: GCS eye subscore is 4. GCS verbal subscore is 5. GCS motor subscore is 6.     Cranial Nerves: No facial asymmetry.     Motor: No weakness.     Coordination: Romberg sign negative. Finger-Nose-Finger Test normal.     Gait: Gait normal.  Psychiatric:     Comments: Normal affect     No results found for any visits on 01/23/22. Last CBC Lab Results  Component Value Date   WBC 11.9 (H) 11/14/2021   HGB 10.8 (L) 11/14/2021   HCT 36.6 (L) 11/14/2021   MCV 82 11/14/2021   MCH 24.3 (L) 11/14/2021   RDW 16.0 (H) 11/14/2021   PLT 589 (H) 32/35/5732   Last metabolic panel Lab Results  Component Value Date   GLUCOSE 91 11/14/2021   NA 138 11/14/2021   K 5.0 11/14/2021   CL 100 11/14/2021   CO2 24 11/14/2021   BUN 14 11/14/2021   CREATININE 0.90 11/14/2021   EGFR 91 11/14/2021   CALCIUM 9.8 11/14/2021   PHOS 2.7 11/05/2021   PROT 6.4 09/22/2021   ALBUMIN 4.3 09/22/2021   LABGLOB 2.1 09/22/2021   AGRATIO 2.0 09/22/2021   BILITOT 0.2 09/22/2021   ALKPHOS 122 (H) 09/22/2021   AST 22 09/22/2021   ALT 17 09/22/2021   ANIONGAP 4 (L) 11/05/2021   Last lipids Lab Results  Component Value Date   CHOL 150 09/22/2021   HDL 45 09/22/2021   LDLCALC 84 09/22/2021   TRIG 117 09/22/2021   CHOLHDL 3.3 09/22/2021   Last hemoglobin A1c Lab Results  Component Value Date   HGBA1C 5.7 (H) 10/28/2021   Last thyroid functions Lab Results  Component Value Date   TSH 2.770 09/22/2021   Last vitamin D Lab Results  Component Value Date   VD25OH 37.4 09/22/2021   Last vitamin B12 and Folate No results found for:  "VITAMINB12", "FOLATE"      Assessment & Plan:    Routine Health Maintenance and Physical Exam  Immunization History  Administered Date(s) Administered   Fluad Quad(high Dose 65+) 11/14/2021   Moderna Sars-Covid-2 Vaccination 05/30/2019, 07/02/2019   Pneumococcal Polysaccharide-23 06/22/2021   Tdap 06/22/2021   Zoster Recombinat (Shingrix) 11/03/2019, 01/05/2020    Health Maintenance  Topic Date Due   COVID-19 Vaccine (3 - Moderna risk series) 07/30/2019   Pneumonia Vaccine 35+ Years old (2 - PCV) 06/23/2022   Medicare Annual Wellness (AWV)  08/23/2022  COLONOSCOPY (Pts 45-24yr Insurance coverage will need to be confirmed)  09/29/2031   INFLUENZA VACCINE  Completed   Hepatitis C Screening  Completed   Zoster Vaccines- Shingrix  Completed   HPV VACCINES  Aged Out    Discussed health benefits of physical activity, and encouraged him to engage in regular exercise appropriate for his age and condition.  Encounter for general adult medical examination with abnormal findings Assessment & Plan: Physical exam as documented Counseling is done on healthy lifestyle involving commitment to 150 minutes of exercise per week,  Discussed heart-healthy diet  The patient is up-to-date with his required immunization       S/P total knee replacement, right 02/08/21 Assessment & Plan: He reports intermittent swelling of his right knee recently with increased pain He reports ice application and elevation of the affected knee with relief of symptoms No swelling or signs of inflammation noted on physical examination of the affected knee Encouraged to follow-up with orthopedics as indicated Encouraged to alternate Tylenol and ibuprofen as needed for pain control   Anxiety Assessment & Plan: He denies suicidal ideation homicidal ideation He requested a refill of his Xanax Refill sent to his pharmacy  Orders: -     ALPRAZolam; Take 1 tablet (0.25 mg total) by mouth 2 (two) times daily  as needed for anxiety.  Dispense: 20 tablet; Refill: 0  Acute bilateral low back pain without sciatica Assessment & Plan: Patient requested a refill of his muscle relaxant Zanaflex 4 mg refilled and sent to the pharmacy  Orders: -     tiZANidine HCl; Take 1 tablet (4 mg total) by mouth daily.  Dispense: 30 tablet; Refill: 1    Return in about 2 months (around 03/25/2022).     GAlvira Monday FNP

## 2022-01-23 NOTE — Assessment & Plan Note (Addendum)
He reports intermittent swelling of his right knee recently with increased pain He reports ice application and elevation of the affected knee with relief of symptoms No swelling or signs of inflammation noted on physical examination of the affected knee Encouraged to follow-up with orthopedics as indicated Encouraged to alternate Tylenol and ibuprofen as needed for pain control

## 2022-02-16 ENCOUNTER — Ambulatory Visit (INDEPENDENT_AMBULATORY_CARE_PROVIDER_SITE_OTHER): Payer: Medicare HMO | Admitting: Orthopedic Surgery

## 2022-02-16 ENCOUNTER — Ambulatory Visit (INDEPENDENT_AMBULATORY_CARE_PROVIDER_SITE_OTHER): Payer: Medicare HMO

## 2022-02-16 ENCOUNTER — Encounter: Payer: Self-pay | Admitting: Orthopedic Surgery

## 2022-02-16 VITALS — Ht 71.0 in | Wt 178.0 lb

## 2022-02-16 DIAGNOSIS — Z96651 Presence of right artificial knee joint: Secondary | ICD-10-CM | POA: Diagnosis not present

## 2022-02-16 DIAGNOSIS — M1711 Unilateral primary osteoarthritis, right knee: Secondary | ICD-10-CM

## 2022-02-16 NOTE — Progress Notes (Signed)
Chief Complaint  Patient presents with   Routine Post Op    R TKR 02/08/21   Patient ID: Albert Gonzalez, male   DOB: 1950/03/09, 71 y.o.   MRN: [id number]  Chief Complaint  Patient presents with   Routine Post Op    R TKR 02/08/21    HPI Albert Gonzalez is a 71 y.o. male.   Status post right  total knee arthroplasty. The patient is doing well the knee implant is functioning well.  Physical Exam Ht '5\' 11"'$  (1.803 m)   Wt 178 lb (80.7 kg)   BMI 24.83 kg/m   Gait: normal   Assist device no  ROM: 120 Stability in extension yes Stability in flexion  yes  Data Reviewed Today's imaging shows stable total knee implant without loosening or complication  Assessment Doing well  Encounter Diagnoses  Name Primary?   S/P total knee replacement, right. 2022    Primary osteoarthritis of right knee Yes     Plan Return 2 yrs xrays     Arther Abbott 02/16/2022, 3:02 PM

## 2022-03-03 ENCOUNTER — Telehealth: Payer: Self-pay | Admitting: Family Medicine

## 2022-03-03 NOTE — Telephone Encounter (Signed)
Patient called ? Bill for [employer] in June.  The claim was not paid.  I have sent a request to [facility] Case to review this.  I think it may should have been [id number] and not [id number].      Not to Suanne Marker- Will you look at MRN [id number] - Fran Mcree?  I think this may should have been [id number].   Not sure but Insurance did not pay it.  Please let me know what you find out.  Thanks for your help.

## 2022-03-16 ENCOUNTER — Inpatient Hospital Stay: Payer: Medicare HMO | Attending: Hematology

## 2022-03-16 DIAGNOSIS — C182 Malignant neoplasm of ascending colon: Secondary | ICD-10-CM | POA: Insufficient documentation

## 2022-03-16 DIAGNOSIS — F17290 Nicotine dependence, other tobacco product, uncomplicated: Secondary | ICD-10-CM | POA: Diagnosis not present

## 2022-03-16 DIAGNOSIS — D649 Anemia, unspecified: Secondary | ICD-10-CM | POA: Diagnosis not present

## 2022-03-16 DIAGNOSIS — C184 Malignant neoplasm of transverse colon: Secondary | ICD-10-CM

## 2022-03-16 DIAGNOSIS — Z79899 Other long term (current) drug therapy: Secondary | ICD-10-CM | POA: Insufficient documentation

## 2022-03-16 LAB — COMPREHENSIVE METABOLIC PANEL
ALT: 29 U/L (ref 0–44)
AST: 30 U/L (ref 15–41)
Albumin: 4.5 g/dL (ref 3.5–5.0)
Alkaline Phosphatase: 117 U/L (ref 38–126)
Anion gap: 11 (ref 5–15)
BUN: 17 mg/dL (ref 8–23)
CO2: 25 mmol/L (ref 22–32)
Calcium: 9.3 mg/dL (ref 8.9–10.3)
Chloride: 99 mmol/L (ref 98–111)
Creatinine, Ser: 0.95 mg/dL (ref 0.61–1.24)
GFR, Estimated: >60 mL/min (ref >60)
Glucose, Bld: 95 mg/dL (ref 70–99)
Potassium: 4.2 mmol/L (ref 3.5–5.1)
Sodium: 135 mmol/L (ref 135–145)
Total Bilirubin: 0.8 mg/dL (ref 0.3–1.2)
Total Protein: 7.4 g/dL (ref 6.5–8.1)

## 2022-03-16 LAB — CBC WITH DIFFERENTIAL/PLATELET
Abs Immature Granulocytes: 0.01 10*3/uL (ref 0.00–0.07)
Basophils Absolute: 0.1 10*3/uL (ref 0.0–0.1)
Basophils Relative: 1 %
Eosinophils Absolute: 0.9 10*3/uL — ABNORMAL HIGH (ref 0.0–0.5)
Eosinophils Relative: 11 %
HCT: 49.8 % (ref 39.0–52.0)
Hemoglobin: 16 g/dL (ref 13.0–17.0)
Immature Granulocytes: 0 %
Lymphocytes Relative: 25 %
Lymphs Abs: 2 10*3/uL (ref 0.7–4.0)
MCH: 27.7 pg (ref 26.0–34.0)
MCHC: 32.1 g/dL (ref 30.0–36.0)
MCV: 86.2 fL (ref 80.0–100.0)
Monocytes Absolute: 0.7 10*3/uL (ref 0.1–1.0)
Monocytes Relative: 8 %
Neutro Abs: 4.5 10*3/uL (ref 1.7–7.7)
Neutrophils Relative %: 55 %
Platelets: 266 10*3/uL (ref 150–400)
RBC: 5.78 MIL/uL (ref 4.22–5.81)
RDW: 14.7 % (ref 11.5–15.5)
WBC: 8.1 10*3/uL (ref 4.0–10.5)
nRBC: 0 % (ref 0.0–0.2)

## 2022-03-16 LAB — IRON AND TIBC
Iron: 108 ug/dL (ref 45–182)
Saturation Ratios: 24 % (ref 17.9–39.5)
TIBC: 450 ug/dL (ref 250–450)
UIBC: 342 ug/dL

## 2022-03-16 LAB — FERRITIN: Ferritin: 22 ng/mL — ABNORMAL LOW (ref 24–336)

## 2022-03-18 LAB — CEA: CEA: 3.1 ng/mL (ref 0.0–4.7)

## 2022-03-23 ENCOUNTER — Inpatient Hospital Stay: Payer: Medicare HMO | Admitting: Hematology

## 2022-03-23 ENCOUNTER — Encounter: Payer: Self-pay | Admitting: Hematology

## 2022-03-23 VITALS — BP 142/94 | HR 82 | Temp 97.6°F | Resp 18 | Wt 173.8 lb

## 2022-03-23 DIAGNOSIS — D649 Anemia, unspecified: Secondary | ICD-10-CM | POA: Diagnosis not present

## 2022-03-23 DIAGNOSIS — Z79899 Other long term (current) drug therapy: Secondary | ICD-10-CM | POA: Diagnosis not present

## 2022-03-23 DIAGNOSIS — F17290 Nicotine dependence, other tobacco product, uncomplicated: Secondary | ICD-10-CM | POA: Diagnosis not present

## 2022-03-23 DIAGNOSIS — C184 Malignant neoplasm of transverse colon: Secondary | ICD-10-CM | POA: Diagnosis not present

## 2022-03-23 DIAGNOSIS — C182 Malignant neoplasm of ascending colon: Secondary | ICD-10-CM | POA: Diagnosis not present

## 2022-03-23 NOTE — Progress Notes (Signed)
[place], [place] [address]   CLINIC:  Medical Oncology/Hematology  PCP:  [name], [address] [place] [address] [phone]   REASON FOR VISIT:  Follow-up for stage II right colon cancer  PRIOR THERAPY: Right hemicolectomy on 11/02/2021  NGS Results: MMR preserved  CURRENT THERAPY: Surveillance  BRIEF ONCOLOGIC HISTORY:  Oncology History   No history exists.    CANCER STAGING:  Cancer Staging  No matching staging information was found for the patient.   INTERVAL HISTORY:  Albert Gonzalez 72 y.o. male seen for follow-up of colon cancer.  Denies any bleeding per rectum or melena.  Denies any change in bowel habits.  Energy levels are 100%.  Denies any abdominal pains.  REVIEW OF SYSTEMS:  Review of Systems  All other systems reviewed and are negative.    PAST MEDICAL/SURGICAL HISTORY:  Past Medical History:  Diagnosis Date   Anxiety    Arthritis    Cancer ([place])    COPD (chronic obstructive pulmonary disease) ([place])    Depression    History of kidney stones    Hyperlipidemia    Inguinal hernia right    Insomnia    Myalgia    PPD positive, treated 1979   1 year of INH   Raynaud phenomenon    Restless leg syndrome    Sarcoidosis 1980   Past Surgical History:  Procedure Laterality Date   AXILLARY LYMPH NODE BIOPSY Right 1980   BIOPSY  09/28/2021   Procedure: BIOPSY;  Surgeon: Daneil Dolin, MD;  [facility];  Service: Endoscopy;;   COLON SURGERY     COLONOSCOPY  02/08/2011   Procedure: COLONOSCOPY;  Surgeon: Daneil Dolin, MD;  [facility];  Service: Endoscopy;  Laterality: N/A;  1:30 PM   COLONOSCOPY WITH PROPOFOL N/A 09/28/2021   Procedure: COLONOSCOPY WITH PROPOFOL;  Surgeon: Daneil Dolin, MD;  [facility];  Service: Endoscopy;  Laterality: N/A;  2:15pm   INGUINAL HERNIA REPAIR Right 01/29/2017   Procedure: HERNIA REPAIR INGUINAL ADULT WITH MESH;   Surgeon: Virl Cagey, MD;  Location: [facility];  Service: General;  Laterality: Right;   KNEE ARTHROSCOPY WITH MEDIAL MENISECTOMY Right 04/05/2016   Procedure: RIGHT KNEE ARTHROSCOPY WITH PARTIAL  LATERAL MENISECTOMY, DEBRIDEMENT MEDIAL MENISCUS, ACL DEBRIDEMENT, MICRO FRACTURE OF FEMUR;  Surgeon: Carole Civil, MD;  Location: [facility];  Service: Orthopedics;  Laterality: Right;   KNEE SURGERY     left   KYPHOPLASTY     LAPAROSCOPIC PARTIAL COLECTOMY N/A 11/02/2021   Procedure: LAPAROSCOPIC EXTENDED RIGHT HEMICOLECTOMY;  Surgeon: Virl Cagey, MD;  Location: [facility];  Service: General;  Laterality: N/A;   POLYPECTOMY  09/28/2021   Procedure: POLYPECTOMY;  Surgeon: Daneil Dolin, MD;  [facility];  Service: Endoscopy;;   TONSILLECTOMY     TOTAL KNEE ARTHROPLASTY Right 02/08/2021   Procedure: TOTAL KNEE ARTHROPLASTY;  Surgeon: Carole Civil, MD;  Location: [facility];  Service: Orthopedics;  Laterality: Right;     SOCIAL HISTORY:  Social History   Socioeconomic History   Marital status: Married    Spouse name: Not on file   Number of children: 1   Years of education: Not on file   Highest education level: Not on file  Occupational History   Occupation: respiratory therapist    Employer: [employer]  Tobacco Use   Smoking status: Former  Packs/day: 0.50    Years: 20.00    Total pack years: 10.00    Types: Pipe, Cigarettes    Quit date: 04/03/1990    Years since quitting: 31.9   Smokeless tobacco: Never   Tobacco comments:    quit many years ago  Vaping Use   Vaping Use: Never used  Substance and Sexual Activity   Alcohol use: Not Currently    Comment: occassional   Drug use: No   Sexual activity: Yes  Other Topics Concern   Not on file  Social History Narrative   Not on file   Social Determinants of Health   Financial Resource Strain: Not on file  Food Insecurity: No Food Insecurity (11/02/2021)   Hunger Vital Sign     Worried About Running Out of Food in the Last Year: Never true    Ran Out of Food in the Last Year: Never true  Transportation Needs: No Transportation Needs (11/02/2021)   PRAPARE - Hydrologist (Medical): No    Lack of Transportation (Non-Medical): No  Physical Activity: Not on file  Stress: Not on file  Social Connections: Not on file  Intimate Partner Violence: Not At Risk (11/02/2021)   Humiliation, Afraid, Rape, and Kick questionnaire    Fear of Current or Ex-Partner: No    Emotionally Abused: No    Physically Abused: No    Sexually Abused: No    FAMILY HISTORY:  Family History  Problem Relation Age of Onset   Suicidality Father    Colon cancer Neg Hx    Liver disease Neg Hx    Inflammatory bowel disease Neg Hx    Anesthesia problems Neg Hx     CURRENT MEDICATIONS:  Outpatient Encounter Medications as of 03/23/2022  Medication Sig Note   albuterol (VENTOLIN HFA) 108 (90 Base) MCG/ACT inhaler INHALE ONE PUFF INTO THE LUNGS FOUR TIMES DAILY AS NEEDED    ALPRAZolam (XANAX) 0.25 MG tablet Take 1 tablet (0.25 mg total) by mouth 2 (two) times daily as needed for anxiety.    aspirin 325 MG tablet Take 325 mg by mouth daily.    atorvastatin (LIPITOR) 40 MG tablet TAKE ONE TABLET BY MOUTH EVERY NIGHT AT BEDTIME    busPIRone (BUSPAR) 10 MG tablet TAKE ONE (1) TABLET BY MOUTH 3 TIMES DAILY    busPIRone (BUSPAR) 10 MG tablet Take 1 tablet (10 mg total) by mouth 3 (three) times daily.    Calcium Citrate-Vitamin D (CALCIUM + D PO) Take 2 tablets by mouth daily.    diclofenac (VOLTAREN) 75 MG EC tablet Take 75 mg by mouth 2 (two) times daily. 11/01/2021: Last filled 05/12/2021 75 MG TBEC (disp 60, 30d supply)    DULoxetine (CYMBALTA) 60 MG capsule TAKE 1 (ONE) CAPSULE BY MOUTH TWO TIMES DAILY (Patient taking differently: Take 60 mg by mouth 2 (two) times daily.)    fluticasone-salmeterol (ADVAIR) 250-50 MCG/ACT AEPB Inhale 1 puff into the lungs in the morning and at  bedtime.    ibuprofen (ADVIL) 200 MG tablet Take 800 mg by mouth daily.    Misc Natural Products (GLUCOSAMINE CHOND DOUBLE STR PO) Take 2 tablets by mouth daily.    Multiple Vitamin (MULTIVITAMIN) tablet Take 1 tablet by mouth daily. Men's 50+    polyethylene glycol (MIRALAX / GLYCOLAX) 17 g packet Take 17 g by mouth daily as needed for mild constipation. (Patient taking differently: Take 17 g by mouth daily.)    tiZANidine (ZANAFLEX) 4 MG  tablet Take 1 tablet (4 mg total) by mouth daily.    No facility-administered encounter medications on file as of 03/23/2022.    ALLERGIES:  Allergies  Allergen Reactions   Sulfa Antibiotics Rash    Childhood      PHYSICAL EXAM:  ECOG Performance status: 1  Vitals:   03/23/22 1454  BP: (!) 142/94  Pulse: 82  Resp: 18  Temp: 97.6 F (36.4 C)  SpO2: 96%   Filed Weights   03/23/22 1453  Weight: 173 lb 12.8 oz (78.8 kg)   Physical Exam Vitals reviewed.  Constitutional:      Appearance: Normal appearance.  Cardiovascular:     Rate and Rhythm: Normal rate and regular rhythm.     Heart sounds: Normal heart sounds.  Pulmonary:     Breath sounds: Normal breath sounds.  Abdominal:     Palpations: Abdomen is soft. There is no mass.  Neurological:     Mental Status: He is alert.  Psychiatric:        Mood and Affect: Mood normal.        Behavior: Behavior normal.      LABORATORY DATA:  I have reviewed the labs as listed.  CBC    Component Value Date/Time   WBC 8.1 03/16/2022 1335   RBC 5.78 03/16/2022 1335   HGB 16.0 03/16/2022 1335   HGB 10.8 (L) 11/14/2021 1443   HCT 49.8 03/16/2022 1335   HCT 36.6 (L) 11/14/2021 1443   PLT 266 03/16/2022 1335   PLT 589 (H) 11/14/2021 1443   MCV 86.2 03/16/2022 1335   MCV 82 11/14/2021 1443   MCH 27.7 03/16/2022 1335   MCHC 32.1 03/16/2022 1335   RDW 14.7 03/16/2022 1335   RDW 16.0 (H) 11/14/2021 1443   LYMPHSABS 2.0 03/16/2022 1335   LYMPHSABS 1.5 11/14/2021 1443   MONOABS 0.7  03/16/2022 1335   EOSABS 0.9 (H) 03/16/2022 1335   EOSABS 0.5 (H) 11/14/2021 1443   BASOSABS 0.1 03/16/2022 1335   BASOSABS 0.1 11/14/2021 1443      Latest Ref Rng & Units 03/16/2022    1:35 PM 11/14/2021    2:43 PM 11/05/2021    5:42 AM  CMP  Glucose 70 - 99 mg/dL 95  91  93   BUN 8 - 23 mg/dL '17  14  13   '$ Creatinine 0.61 - 1.24 mg/dL 0.95  0.90  0.71   Sodium 135 - 145 mmol/L 135  138  140   Potassium 3.5 - 5.1 mmol/L 4.2  5.0  3.7   Chloride 98 - 111 mmol/L 99  100  109   CO2 22 - 32 mmol/L '25  24  27   '$ Calcium 8.9 - 10.3 mg/dL 9.3  9.8  8.1   Total Protein 6.5 - 8.1 g/dL 7.4     Total Bilirubin 0.3 - 1.2 mg/dL 0.8     Alkaline Phos 38 - 126 U/L 117     AST 15 - 41 U/L 30     ALT 0 - 44 U/L 29       DIAGNOSTIC IMAGING:  I have independently reviewed the scans and discussed with the patient.  ASSESSMENT:  1.  Stage II (PT3 N0) ascending colon adenocarcinoma: - Colonoscopy (09/28/2021): Exophytic apple core appearing mass in the proximal colon.  Location in relation to the hepatic flexure or cecum could not be ascertained with certainty.  Nonbleeding internal hemorrhoids. - CEA on 09/28/2021-2.8 - CT [facility] (10/12/2021): Apple core lesion in  the hepatic flexure with no evidence of metastatic disease in the abdomen or pelvis. - Laparoscopic right hemicolectomy by Dr. Constance Haw on 11/02/2021 - Pathology: Moderately differentiated adenocarcinoma extending into pericolonic connective tissue, margins negative, 0/24 lymph nodes involved, negative LVI and PNI, MMR preserved. - Personal history of sarcoidosis from a left axillary lymph node biopsy prior to 2000   2.  Social/family history: - He lives at home with his wife.  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  He smoked a pipe briefly for less than a year but was never cigarette smoker.  Maternal uncle had esophageal cancer.  PLAN:   1.  Stage II (PT3N0) ascending colon adenocarcinoma: - Previous CT  on 12/14/2021 with no evidence of metastatic disease in the thorax. - LFTs are within normal limits.  CBC was normal.  CEA was 3.1 on 03/16/2022. - No change in bowel habits or bleeding per rectum/melena. - RTC 3 months with repeat CT [facility], CEA and routine labs.  2.  Normocytic anemia: - He is taking iron tablet twice daily.  Hemoglobin improved from 10.8-16.  Ferritin is 22. - He will continue iron twice daily.  Plan to repeat ferritin and iron panel at next visit.    Orders placed this encounter:  No orders of the defined types were placed in this encounter.     Derek Jack, MD [place] [phone]

## 2022-03-23 NOTE — Patient Instructions (Signed)
[facility]  Discharge Instructions  You were seen and examined today by Dr. Delton Coombes.  Dr. Delton Coombes discussed your most recent lab work which revealed that everything looks good your iron is still low but this is because it is being used up to make your blood cells.  Continue taking iron twice daily.  Follow-up as scheduled in 3 months with labs and CT scan.    Thank you for choosing [facility] to provide your oncology and hematology care.   To afford each patient quality time with our provider, please arrive at least 15 minutes before your scheduled appointment time. You may need to reschedule your appointment if you arrive late (10 or more minutes). Arriving late affects you and other patients whose appointments are after yours.  Also, if you miss three or more appointments without notifying the office, you may be dismissed from the clinic at the provider's discretion.    Again, thank you for choosing [facility].  Our hope is that these requests will decrease the amount of time that you wait before being seen by our physicians.   If you have a lab appointment with the [facility] please come in thru the Main Entrance and check in at the main information desk.           _____________________________________________________________  Should you have questions after your visit to [facility], please contact our office at [phone] and follow the prompts.  Our office hours are 8:00 a.m. to 4:30 p.m. Monday - Thursday and 8:00 a.m. to 2:30 p.m. Friday.  Please note that voicemails left after 4:00 p.m. may not be returned until the following business day.  We are closed weekends and all major holidays.  You do have access to a nurse 24-7, just call the main number to the clinic [phone] and do not press any options, hold on the line and a nurse will answer the phone.    For prescription refill  requests, have your pharmacy contact our office and allow 72 hours.    Masks are optional in the cancer centers. If you would like for your care team to wear a mask while they are taking care of you, please let them know. You may have one support person who is at least 73 years old accompany you for your appointments.

## 2022-03-27 ENCOUNTER — Ambulatory Visit: Payer: Medicare HMO | Admitting: Family Medicine

## 2022-03-31 ENCOUNTER — Ambulatory Visit (INDEPENDENT_AMBULATORY_CARE_PROVIDER_SITE_OTHER): Payer: Medicare HMO | Admitting: Family Medicine

## 2022-03-31 ENCOUNTER — Ambulatory Visit (HOSPITAL_COMMUNITY)
Admission: RE | Admit: 2022-03-31 | Discharge: 2022-03-31 | Disposition: A | Discharge status: Home / Self Care | Payer: Medicare HMO | Source: Ambulatory Visit | Attending: Family Medicine | Admitting: Family Medicine

## 2022-03-31 ENCOUNTER — Encounter: Payer: Self-pay | Admitting: Family Medicine

## 2022-03-31 VITALS — BP 125/84 | HR 79 | Ht 71.0 in | Wt 174.0 lb

## 2022-03-31 DIAGNOSIS — E559 Vitamin D deficiency, unspecified: Secondary | ICD-10-CM | POA: Diagnosis not present

## 2022-03-31 DIAGNOSIS — F419 Anxiety disorder, unspecified: Secondary | ICD-10-CM

## 2022-03-31 DIAGNOSIS — R7301 Impaired fasting glucose: Secondary | ICD-10-CM

## 2022-03-31 DIAGNOSIS — M79645 Pain in left finger(s): Secondary | ICD-10-CM

## 2022-03-31 DIAGNOSIS — E785 Hyperlipidemia, unspecified: Secondary | ICD-10-CM

## 2022-03-31 DIAGNOSIS — M545 Low back pain, unspecified: Secondary | ICD-10-CM

## 2022-03-31 DIAGNOSIS — J449 Chronic obstructive pulmonary disease, unspecified: Secondary | ICD-10-CM | POA: Diagnosis not present

## 2022-03-31 DIAGNOSIS — E038 Other specified hypothyroidism: Secondary | ICD-10-CM

## 2022-03-31 DIAGNOSIS — M19032 Primary osteoarthritis, left wrist: Secondary | ICD-10-CM | POA: Diagnosis not present

## 2022-03-31 DIAGNOSIS — M19042 Primary osteoarthritis, left hand: Secondary | ICD-10-CM | POA: Diagnosis not present

## 2022-03-31 MED ORDER — ALPRAZOLAM 0.25 MG PO TABS: 0.2500 mg | ORAL_TABLET | Freq: Two times a day (BID) | ORAL | 0 refills | Status: DC | PRN

## 2022-03-31 MED ORDER — TIZANIDINE HCL 4 MG PO TABS: 4.0000 mg | ORAL_TABLET | Freq: Every day | ORAL | 1 refills | Status: DC

## 2022-03-31 NOTE — Patient Instructions (Addendum)
I appreciate the opportunity to provide care to you today!    Follow up:  3 months  Labs: please stop by the lab today/ during the week to get your blood drawn ( TSH, Lipid profile, HgA1c, Vit D)   I recommend conservative treatment for left thumb pain I recommend avoiding activities that aggravate or intensify the pain in your left thumb Continue taking Advil as needed for thumb pain   Please continue to a heart-healthy diet and increase your physical activities. Try to exercise for 34mns at least five times a week.      It was a pleasure to see you and I look forward to continuing to work together on your health and well-being. Please do not hesitate to call the office if you need care or have questions about your care.   Have a wonderful day and week. With Gratitude, GAlvira MondayMSN, FNP-BC

## 2022-03-31 NOTE — Progress Notes (Unsigned)
Established Patient Office Visit  Subjective:  Patient ID: Albert Gonzalez, male    DOB: 1950/08/29  Age: 72 y.o. MRN: [id number]  CC:  Chief Complaint  Patient presents with   Follow-up    2 month f/u. Needs refills today, pt reports thumb pain on left hand injured it doing yard work around 03/03/2022.     HPI LC JOYNT is a 72 y.o. male with past medical history of *** presents for f/u of *** chronic medical conditions.  Past Medical History:  Diagnosis Date   Anxiety    Arthritis    Cancer ([place])    COPD (chronic obstructive pulmonary disease) ([place])    Depression    History of kidney stones    Hyperlipidemia    Inguinal hernia right    Insomnia    Myalgia    PPD positive, treated 1979   1 year of INH   Raynaud phenomenon    Restless leg syndrome    Sarcoidosis 1980    Past Surgical History:  Procedure Laterality Date   AXILLARY LYMPH NODE BIOPSY Right 1980   BIOPSY  09/28/2021   Procedure: BIOPSY;  Surgeon: Daneil Dolin, MD;  [facility];  Service: Endoscopy;;   COLON SURGERY     COLONOSCOPY  02/08/2011   Procedure: COLONOSCOPY;  Surgeon: Daneil Dolin, MD;  [facility];  Service: Endoscopy;  Laterality: N/A;  1:30 PM   COLONOSCOPY WITH PROPOFOL N/A 09/28/2021   Procedure: COLONOSCOPY WITH PROPOFOL;  Surgeon: Daneil Dolin, MD;  [facility];  Service: Endoscopy;  Laterality: N/A;  2:15pm   INGUINAL HERNIA REPAIR Right 01/29/2017   Procedure: HERNIA REPAIR INGUINAL ADULT WITH MESH;  Surgeon: Virl Cagey, MD;  Location: [facility];  Service: General;  Laterality: Right;   KNEE ARTHROSCOPY WITH MEDIAL MENISECTOMY Right 04/05/2016   Procedure: RIGHT KNEE ARTHROSCOPY WITH PARTIAL  LATERAL MENISECTOMY, DEBRIDEMENT MEDIAL MENISCUS, ACL DEBRIDEMENT, MICRO FRACTURE OF FEMUR;  Surgeon: Carole Civil, MD;  Location: [facility];  Service: Orthopedics;  Laterality: Right;   KNEE SURGERY     left   KYPHOPLASTY     LAPAROSCOPIC  PARTIAL COLECTOMY N/A 11/02/2021   Procedure: LAPAROSCOPIC EXTENDED RIGHT HEMICOLECTOMY;  Surgeon: Virl Cagey, MD;  Location: [facility];  Service: General;  Laterality: N/A;   POLYPECTOMY  09/28/2021   Procedure: POLYPECTOMY;  Surgeon: Daneil Dolin, MD;  [facility];  Service: Endoscopy;;   TONSILLECTOMY     TOTAL KNEE ARTHROPLASTY Right 02/08/2021   Procedure: TOTAL KNEE ARTHROPLASTY;  Surgeon: Carole Civil, MD;  Location: [facility];  Service: Orthopedics;  Laterality: Right;    Family History  Problem Relation Age of Onset   Suicidality Father    Colon cancer Neg Hx    Liver disease Neg Hx    Inflammatory bowel disease Neg Hx    Anesthesia problems Neg Hx     Social History   Socioeconomic History   Marital status: Married    Spouse name: Not on file   Number of children: 1   Years of education: Not on file   Highest education level: Not on file  Occupational History   Occupation: respiratory therapist    Employer: [employer]  Tobacco Use   Smoking status: Former    Packs/day: 0.50    Years: 20.00    Total pack years: 10.00    Types: Pipe, Cigarettes    Quit date:  04/03/1990    Years since quitting: 32.0   Smokeless tobacco: Never   Tobacco comments:    quit many years ago  Vaping Use   Vaping Use: Never used  Substance and Sexual Activity   Alcohol use: Not Currently    Comment: occassional   Drug use: No   Sexual activity: Yes  Other Topics Concern   Not on file  Social History Narrative   Not on file   Social Determinants of Health   Financial Resource Strain: Not on file  Food Insecurity: No Food Insecurity (11/02/2021)   Hunger Vital Sign    Worried About Running Out of Food in the Last Year: Never true    Ran Out of Food in the Last Year: Never true  Transportation Needs: No Transportation Needs (11/02/2021)   PRAPARE - Hydrologist (Medical): No    Lack of Transportation  (Non-Medical): No  Physical Activity: Not on file  Stress: Not on file  Social Connections: Not on file  Intimate Partner Violence: Not At Risk (11/02/2021)   Humiliation, Afraid, Rape, and Kick questionnaire    Fear of Current or Ex-Partner: No    Emotionally Abused: No    Physically Abused: No    Sexually Abused: No    Outpatient Medications Prior to Visit  Medication Sig Dispense Refill   albuterol (VENTOLIN HFA) 108 (90 Base) MCG/ACT inhaler INHALE ONE PUFF INTO THE LUNGS FOUR TIMES DAILY AS NEEDED 8.5 g 1   aspirin 325 MG tablet Take 325 mg by mouth daily.     atorvastatin (LIPITOR) 40 MG tablet TAKE ONE TABLET BY MOUTH EVERY NIGHT AT BEDTIME 90 tablet 0   busPIRone (BUSPAR) 10 MG tablet TAKE ONE (1) TABLET BY MOUTH 3 TIMES DAILY 90 tablet 1   busPIRone (BUSPAR) 10 MG tablet Take 1 tablet (10 mg total) by mouth 3 (three) times daily. 90 tablet 1   Calcium Citrate-Vitamin D (CALCIUM + D PO) Take 2 tablets by mouth daily.     diclofenac (VOLTAREN) 75 MG EC tablet Take 75 mg by mouth 2 (two) times daily.     DULoxetine (CYMBALTA) 60 MG capsule TAKE 1 (ONE) CAPSULE BY MOUTH TWO TIMES DAILY (Patient taking differently: Take 60 mg by mouth 2 (two) times daily.) 180 capsule 1   fluticasone-salmeterol (ADVAIR) 250-50 MCG/ACT AEPB Inhale 1 puff into the lungs in the morning and at bedtime. 60 each 1   ibuprofen (ADVIL) 200 MG tablet Take 800 mg by mouth daily.     Misc Natural Products (GLUCOSAMINE CHOND DOUBLE STR PO) Take 2 tablets by mouth daily.     Multiple Vitamin (MULTIVITAMIN) tablet Take 1 tablet by mouth daily. Men's 50+     polyethylene glycol (MIRALAX / GLYCOLAX) 17 g packet Take 17 g by mouth daily as needed for mild constipation. (Patient taking differently: Take 17 g by mouth daily.) 14 each 0   ALPRAZolam (XANAX) 0.25 MG tablet Take 1 tablet (0.25 mg total) by mouth 2 (two) times daily as needed for anxiety. 20 tablet 0   tiZANidine (ZANAFLEX) 4 MG tablet Take 1 tablet (4 mg  total) by mouth daily. 30 tablet 1   No facility-administered medications prior to visit.    Allergies  Allergen Reactions   Sulfa Antibiotics Rash    Childhood     ROS Review of Systems    Objective:    Physical Exam  BP 125/84   Pulse 79   Ht 5'  11" (1.803 m)   Wt 174 lb 0.6 oz (78.9 kg)   SpO2 94%   BMI 24.27 kg/m  Wt Readings from Last 3 Encounters:  03/31/22 174 lb 0.6 oz (78.9 kg)  03/23/22 173 lb 12.8 oz (78.8 kg)  02/16/22 178 lb (80.7 kg)    Lab Results  Component Value Date   TSH 2.770 09/22/2021   Lab Results  Component Value Date   WBC 8.1 03/16/2022   HGB 16.0 03/16/2022   HCT 49.8 03/16/2022   MCV 86.2 03/16/2022   PLT 266 03/16/2022   Lab Results  Component Value Date   NA 135 03/16/2022   K 4.2 03/16/2022   CO2 25 03/16/2022   GLUCOSE 95 03/16/2022   BUN 17 03/16/2022   CREATININE 0.95 03/16/2022   BILITOT 0.8 03/16/2022   ALKPHOS 117 03/16/2022   AST 30 03/16/2022   ALT 29 03/16/2022   PROT 7.4 03/16/2022   ALBUMIN 4.5 03/16/2022   CALCIUM 9.3 03/16/2022   ANIONGAP 11 03/16/2022   EGFR 91 11/14/2021   Lab Results  Component Value Date   CHOL 150 09/22/2021   Lab Results  Component Value Date   HDL 45 09/22/2021   Lab Results  Component Value Date   LDLCALC 84 09/22/2021   Lab Results  Component Value Date   TRIG 117 09/22/2021   Lab Results  Component Value Date   CHOLHDL 3.3 09/22/2021   Lab Results  Component Value Date   HGBA1C 5.7 (H) 10/28/2021      Assessment & Plan:  Anxiety -     ALPRAZolam; Take 1 tablet (0.25 mg total) by mouth 2 (two) times daily as needed for anxiety.  Dispense: 20 tablet; Refill: 0  Acute bilateral low back pain without sciatica -     tiZANidine HCl; Take 1 tablet (4 mg total) by mouth daily.  Dispense: 30 tablet; Refill: 1    Follow-up: No follow-ups on file.   [name], FNP

## 2022-04-01 DIAGNOSIS — M79645 Pain in left finger(s): Secondary | ICD-10-CM | POA: Insufficient documentation

## 2022-04-01 NOTE — Assessment & Plan Note (Signed)
Refilled Xanax 0.25 mg as needed Denies suicidal thoughts and ideation

## 2022-04-01 NOTE — Assessment & Plan Note (Addendum)
Refilled Zanaflex 4 mg daily

## 2022-04-01 NOTE — Assessment & Plan Note (Signed)
Reports injury to his left thumb around 03/03/2022 He has been treated conservatively with an over-the-counter analgesic Reports minimal pain with specific movements of the thumb Will get imaging studies today Encouraged to continue taking Tylenol as needed for pain relief

## 2022-04-03 NOTE — Progress Notes (Signed)
Please inform the patient that the x-ray was negative for fractures and dislocations. He does have osteoarthritis of the joints of his fingers and wrist. Please encourage the patient to follow up with orthopedics for worsening symptoms.

## 2022-04-04 DIAGNOSIS — E785 Hyperlipidemia, unspecified: Secondary | ICD-10-CM | POA: Diagnosis not present

## 2022-04-04 DIAGNOSIS — E038 Other specified hypothyroidism: Secondary | ICD-10-CM | POA: Diagnosis not present

## 2022-04-04 DIAGNOSIS — R7301 Impaired fasting glucose: Secondary | ICD-10-CM | POA: Diagnosis not present

## 2022-04-05 LAB — LIPID PANEL
Chol/HDL Ratio: 3.7 ratio (ref 0.0–5.0)
Cholesterol, Total: 161 mg/dL (ref 100–199)
HDL: 44 mg/dL (ref >39)
LDL Chol Calc (NIH): 85 mg/dL (ref 0–99)
Triglycerides: 185 mg/dL — ABNORMAL HIGH (ref 0–149)
VLDL Cholesterol Cal: 32 mg/dL (ref 5–40)

## 2022-04-05 LAB — TSH+FREE T4
Free T4: 0.99 ng/dL (ref 0.82–1.77)
TSH: 1.88 u[IU]/mL (ref 0.450–4.500)

## 2022-04-05 LAB — HEMOGLOBIN A1C
Est. average glucose Bld gHb Est-mCnc: 108 mg/dL
Hgb A1c MFr Bld: 5.4 % (ref 4.8–5.6)

## 2022-04-05 NOTE — Progress Notes (Signed)
Please inform the patient that his triglyceride is elevated.  I recommend taking fish oil 2000 mg twice daily over-the-counter. I also recommend avoiding simple carbohydrates including cakes, sweet desserts, ice cream, soda (diet or regular), sweet tea, candies, chips, cookies, store-bought juices, alcohol in excess of 1-2 drinks a day, lemonade, artificial sweeteners, donuts, coffee creamers, and sugar-free products.    His hemoglobin A1c, and thyroid levels are stable.

## 2022-04-18 ENCOUNTER — Encounter: Payer: Self-pay | Admitting: Pulmonary Disease

## 2022-04-18 ENCOUNTER — Ambulatory Visit: Payer: Medicare HMO | Admitting: Pulmonary Disease

## 2022-04-18 VITALS — BP 130/78 | HR 68 | Ht 71.0 in | Wt 175.2 lb

## 2022-04-18 DIAGNOSIS — D869 Sarcoidosis, unspecified: Secondary | ICD-10-CM | POA: Diagnosis not present

## 2022-04-18 DIAGNOSIS — J4540 Moderate persistent asthma, uncomplicated: Secondary | ICD-10-CM | POA: Diagnosis not present

## 2022-04-18 NOTE — Patient Instructions (Signed)
Follow up in 6 months 

## 2022-04-18 NOTE — Progress Notes (Signed)
[facility]  Chief Complaint  Patient presents with   Follow-up    Breathing doing well no new concerns     Past Surgical History:  He  has a past surgical history that includes Tonsillectomy; Knee surgery; Colonoscopy (02/08/2011); Kyphoplasty; Knee arthroscopy with medial menisectomy (Right, 04/05/2016); Inguinal hernia repair (Right, 01/29/2017); Axillary lymph node biopsy (Right, 1980); Total knee arthroplasty (Right, 02/08/2021); Colonoscopy with propofol (N/A, 09/28/2021); biopsy (09/28/2021); polypectomy (09/28/2021); Laparoscopic partial colectomy (N/A, 11/02/2021); and Colon surgery.  Past Medical History:  Anxiety, OA, Depression, Nephrolithiasis, HLD, Insomnia  Constitutional:  BP 130/78   Pulse 68   Ht 5' 11"$  (1.803 m)   Wt 175 lb 3.2 oz (79.5 kg)   SpO2 97% Comment: ra  BMI 24.44 kg/m   Brief Summary:  Albert Gonzalez is a 72 y.o. male former smoker with sarcoidosis with pulmonary fibrosis, and asthma.  He had PPD positive in 1979 treated with INH for 1 year, and Rt axillary LN biopsy in 1980 that showed sarcoidosis.      Subjective:   He had CT chest in October with oncology.  Showed stable changes of pulmonary fibrosis.  Breathing much better since he started advair.  Not needing albuterol for past few months.  Not having cough, wheeze, sputum, fever, sweats.  Sleeping okay.  Not feeling like his breathing limits his activity.  Physical Exam:   Appearance - well kempt   ENMT - no sinus tenderness, no oral exudate, no LAN, Mallampati 2 airway, no stridor  Respiratory - equal breath sounds bilaterally, no wheezing or rales  CV - s1s2 regular rate and rhythm, no murmurs  Ext - no clubbing, no edema  Skin - no rashes  Psych - normal mood and affect    Pulmonary testing:  PFT 07/04/21 >> FEV1 3.18 (96%), FEV1% 90, TLC 4.62 (63%), DLCO 59%  Chest Imaging:  HRCT chest 08/15/21 >> moderate fibrosis with apical  basal gradient, irregular peripheral interstitial opacity, septal thickening and nodularity, subpleural BTX and small areas of honeycombing at bases (sarcoidosis versus sarcoidosis +UIP) CT chest 12/15/21 >> no change  Sleep Tests:  PSG 08/10/21 >> AHI 1.8, SpO2 low 90%  Social History:  He  reports that he quit smoking about 32 years ago. His smoking use included pipe and cigarettes. He has a 10.00 pack-year smoking history. He has never used smokeless tobacco. He reports that he does not currently use alcohol. He reports that he does not use drugs.  Family History:  His family history includes Suicidality in his father.     Assessment/Plan:   Allergic asthma. - continue advair 250 one puff bid - prn albuterol - might be able to step down therapy at next visit if he continues to do well  Sarcoidosis. - has fibrotic changes which could have UIP overlap - most recent CT chest stable - monitor clinically and repeat testing if he has symptom progression  Colon cancer. - followed by Dr. Delton Coombes with oncology   Time Spent Involved in Patient Care on Day of Examination:  27 minutes  Follow up:   Patient Instructions  Follow up in 6 months  Medication List:   Allergies as of 04/18/2022       Reactions   Sulfa Antibiotics Rash   Childhood         Medication List        Accurate as of April 18, 2022 11:33 AM. If you have any questions, ask  your nurse or doctor.          albuterol 108 (90 Base) MCG/ACT inhaler Commonly known as: VENTOLIN HFA INHALE ONE PUFF INTO THE LUNGS FOUR TIMES DAILY AS NEEDED   ALPRAZolam 0.25 MG tablet Commonly known as: XANAX Take 1 tablet (0.25 mg total) by mouth 2 (two) times daily as needed for anxiety.   aspirin 325 MG tablet Take 325 mg by mouth daily.   atorvastatin 40 MG tablet Commonly known as: LIPITOR TAKE ONE TABLET BY MOUTH EVERY NIGHT AT BEDTIME   busPIRone 10 MG tablet Commonly known as: BUSPAR TAKE ONE (1)  TABLET BY MOUTH 3 TIMES DAILY   busPIRone 10 MG tablet Commonly known as: BUSPAR Take 1 tablet (10 mg total) by mouth 3 (three) times daily.   CALCIUM + D PO Take 2 tablets by mouth daily.   diclofenac 75 MG EC tablet Commonly known as: VOLTAREN Take 75 mg by mouth 2 (two) times daily.   DULoxetine 60 MG capsule Commonly known as: CYMBALTA TAKE 1 (ONE) CAPSULE BY MOUTH TWO TIMES DAILY What changed: See the new instructions.   fluticasone-salmeterol 250-50 MCG/ACT Aepb Commonly known as: ADVAIR Inhale 1 puff into the lungs in the morning and at bedtime.   GLUCOSAMINE CHOND DOUBLE STR PO Take 2 tablets by mouth daily.   ibuprofen 200 MG tablet Commonly known as: ADVIL Take 800 mg by mouth daily.   multivitamin tablet Take 1 tablet by mouth daily. Men's 50+   polyethylene glycol 17 g packet Commonly known as: MIRALAX / GLYCOLAX Take 17 g by mouth daily as needed for mild constipation. What changed: when to take this   tiZANidine 4 MG tablet Commonly known as: ZANAFLEX Take 1 tablet (4 mg total) by mouth daily.        Signature:  Chesley Mires, MD [place] Pager - [phone] 04/18/2022, 11:33 AM

## 2022-04-27 ENCOUNTER — Encounter: Payer: Self-pay | Admitting: Radiology

## 2022-05-24 ENCOUNTER — Other Ambulatory Visit: Payer: Self-pay | Admitting: Family Medicine

## 2022-06-19 ENCOUNTER — Other Ambulatory Visit: Payer: Self-pay | Admitting: Family Medicine

## 2022-06-20 ENCOUNTER — Inpatient Hospital Stay: Payer: Medicare HMO | Attending: Hematology

## 2022-06-20 ENCOUNTER — Ambulatory Visit (HOSPITAL_COMMUNITY)
Admission: RE | Admit: 2022-06-20 | Discharge: 2022-06-20 | Disposition: A | Discharge status: Home / Self Care | Payer: Medicare HMO | Source: Ambulatory Visit | Attending: Hematology | Admitting: Hematology

## 2022-06-20 DIAGNOSIS — D649 Anemia, unspecified: Secondary | ICD-10-CM | POA: Insufficient documentation

## 2022-06-20 DIAGNOSIS — C189 Malignant neoplasm of colon, unspecified: Secondary | ICD-10-CM | POA: Diagnosis not present

## 2022-06-20 DIAGNOSIS — C184 Malignant neoplasm of transverse colon: Secondary | ICD-10-CM

## 2022-06-20 DIAGNOSIS — Z85038 Personal history of other malignant neoplasm of large intestine: Secondary | ICD-10-CM | POA: Diagnosis not present

## 2022-06-20 DIAGNOSIS — Z08 Encounter for follow-up examination after completed treatment for malignant neoplasm: Secondary | ICD-10-CM | POA: Diagnosis not present

## 2022-06-20 LAB — COMPREHENSIVE METABOLIC PANEL
ALT: 24 U/L (ref 0–44)
AST: 26 U/L (ref 15–41)
Albumin: 4.5 g/dL (ref 3.5–5.0)
Alkaline Phosphatase: 101 U/L (ref 38–126)
Anion gap: 8 (ref 5–15)
BUN: 20 mg/dL (ref 8–23)
CO2: 28 mmol/L (ref 22–32)
Calcium: 9.7 mg/dL (ref 8.9–10.3)
Chloride: 101 mmol/L (ref 98–111)
Creatinine, Ser: 0.96 mg/dL (ref 0.61–1.24)
GFR, Estimated: >60 mL/min (ref >60)
Glucose, Bld: 90 mg/dL (ref 70–99)
Potassium: 4.8 mmol/L (ref 3.5–5.1)
Sodium: 137 mmol/L (ref 135–145)
Total Bilirubin: 0.2 mg/dL — ABNORMAL LOW (ref 0.3–1.2)
Total Protein: 7.7 g/dL (ref 6.5–8.1)

## 2022-06-20 LAB — CBC WITH DIFFERENTIAL/PLATELET
Abs Immature Granulocytes: 0.01 10*3/uL (ref 0.00–0.07)
Basophils Absolute: 0.1 10*3/uL (ref 0.0–0.1)
Basophils Relative: 2 %
Eosinophils Absolute: 1 10*3/uL — ABNORMAL HIGH (ref 0.0–0.5)
Eosinophils Relative: 12 %
HCT: 50.1 % (ref 39.0–52.0)
Hemoglobin: 16.5 g/dL (ref 13.0–17.0)
Immature Granulocytes: 0 %
Lymphocytes Relative: 23 %
Lymphs Abs: 2 10*3/uL (ref 0.7–4.0)
MCH: 29.5 pg (ref 26.0–34.0)
MCHC: 32.9 g/dL (ref 30.0–36.0)
MCV: 89.6 fL (ref 80.0–100.0)
Monocytes Absolute: 0.7 10*3/uL (ref 0.1–1.0)
Monocytes Relative: 9 %
Neutro Abs: 4.8 10*3/uL (ref 1.7–7.7)
Neutrophils Relative %: 54 %
Platelets: 291 10*3/uL (ref 150–400)
RBC: 5.59 MIL/uL (ref 4.22–5.81)
RDW: 13.1 % (ref 11.5–15.5)
WBC: 8.6 10*3/uL (ref 4.0–10.5)
nRBC: 0 % (ref 0.0–0.2)

## 2022-06-20 LAB — IRON AND TIBC
Iron: 93 ug/dL (ref 45–182)
Saturation Ratios: 22 % (ref 17.9–39.5)
TIBC: 432 ug/dL (ref 250–450)
UIBC: 339 ug/dL

## 2022-06-20 LAB — FERRITIN: Ferritin: 38 ng/mL (ref 24–336)

## 2022-06-20 MED ORDER — IOHEXOL 300 MG/ML  SOLN
100.0000 mL | Freq: Once | INTRAMUSCULAR | Status: AC | PRN
  Administered 2022-06-20: 100 mL via INTRAVENOUS

## 2022-06-21 ENCOUNTER — Ambulatory Visit: Payer: Medicare HMO | Admitting: Vascular Surgery

## 2022-06-21 ENCOUNTER — Encounter: Payer: Self-pay | Admitting: Vascular Surgery

## 2022-06-21 ENCOUNTER — Ambulatory Visit (INDEPENDENT_AMBULATORY_CARE_PROVIDER_SITE_OTHER): Payer: Medicare HMO

## 2022-06-21 VITALS — BP 129/82 | HR 66 | Temp 97.2°F | Ht 71.0 in | Wt 174.0 lb

## 2022-06-21 DIAGNOSIS — I7143 Infrarenal abdominal aortic aneurysm, without rupture: Secondary | ICD-10-CM

## 2022-06-21 NOTE — Progress Notes (Signed)
[facility]  Patient name: Albert Gonzalez MRN: [id number] DOB: 1950/07/19 Sex: male  REASON FOR VISIT: Follow-up known infrarenal abdominal aortic aneurysm  HPI: Albert Gonzalez is a 72 y.o. male today for follow-up.  He had an incidental finding of 0.5 cm infrarenal abdominal aortic aneurysm during workup for colon cancer.  He did quite well with his colon cancer resection in September 2023.  He looks quite good today.  Has returned to his normal health.  He has no symptoms referable to his aneurysm.  Past Medical History:  Diagnosis Date   Anxiety    Arthritis    Cancer    COPD (chronic obstructive pulmonary disease)    Depression    History of kidney stones    Hyperlipidemia    Inguinal hernia right    Insomnia    Myalgia    PPD positive, treated 1979   1 year of INH   Raynaud phenomenon    Restless leg syndrome    Sarcoidosis 1980    Family History  Problem Relation Age of Onset   Suicidality Father    Colon cancer Neg Hx    Liver disease Neg Hx    Inflammatory bowel disease Neg Hx    Anesthesia problems Neg Hx     SOCIAL HISTORY: Social History   Tobacco Use   Smoking status: Former    Packs/day: 0.50    Years: 20.00    Additional pack years: 0.00    Total pack years: 10.00    Types: Pipe, Cigarettes    Quit date: 04/03/1990    Years since quitting: 32.2   Smokeless tobacco: Never   Tobacco comments:    quit many years ago  Substance Use Topics   Alcohol use: Not Currently    Comment: occassional    Allergies  Allergen Reactions   Sulfa Antibiotics Rash    Childhood     Current Outpatient Medications  Medication Sig Dispense Refill   albuterol (VENTOLIN HFA) 108 (90 Base) MCG/ACT inhaler INHALE ONE PUFF INTO THE LUNGS FOUR TIMES DAILY AS NEEDED 8.5 g 1   ALPRAZolam (XANAX) 0.25 MG tablet Take 1 tablet (0.25 mg total) by mouth 2 (two) times daily as needed for anxiety. 20 tablet 0    aspirin 325 MG tablet Take 325 mg by mouth daily.     atorvastatin (LIPITOR) 40 MG tablet TAKE ONE TABLET BY MOUTH EVERY NIGHT AT BEDTIME 90 tablet 0   busPIRone (BUSPAR) 10 MG tablet TAKE ONE (1) TABLET BY MOUTH 3 TIMES DAILY 90 tablet 1   busPIRone (BUSPAR) 10 MG tablet Take 1 tablet (10 mg total) by mouth 3 (three) times daily. 90 tablet 1   Calcium Citrate-Vitamin D (CALCIUM + D PO) Take 2 tablets by mouth daily.     diclofenac (VOLTAREN) 75 MG EC tablet TAKE ONE TABLET BY MOUTH TWICE DAILY AS NEEDED 60 tablet 0   DULoxetine (CYMBALTA) 60 MG capsule TAKE 1 (ONE) CAPSULE BY MOUTH TWO TIMES DAILY 180 capsule 1   fluticasone-salmeterol (ADVAIR) 250-50 MCG/ACT AEPB Inhale 1 puff into the lungs in the morning and at bedtime. 60 each 1   ibuprofen (ADVIL) 200 MG tablet Take 800 mg by mouth daily.     Misc Natural Products (GLUCOSAMINE CHOND DOUBLE STR PO) Take 2 tablets by mouth daily.     Multiple Vitamin (MULTIVITAMIN) tablet Take 1 tablet by mouth daily. Men's 50+     polyethylene glycol (MIRALAX /  GLYCOLAX) 17 g packet Take 17 g by mouth daily as needed for mild constipation. (Patient taking differently: Take 17 g by mouth daily.) 14 each 0   tiZANidine (ZANAFLEX) 4 MG tablet Take 1 tablet (4 mg total) by mouth daily. 30 tablet 1   No current facility-administered medications for this visit.    REVIEW OF SYSTEMS:   denotes positive finding,  denotes negative finding Cardiac  Comments:  Chest pain or chest pressure:    Shortness of breath upon exertion:    Short of breath when lying flat:    Irregular heart rhythm:        Vascular    Pain in calf, thigh, or hip brought on by ambulation:    Pain in feet at night that wakes you up from your sleep:     Blood clot in your veins:    Leg swelling:           PHYSICAL EXAM: Vitals:   06/21/22 0832  BP: 129/82  Pulse: 66  Temp: (!) 97.2 F (36.2 C)  SpO2: 96%  Weight: 174 lb (78.9 kg)  Height:  (1.803 m)     GENERAL: The patient is a well-nourished male, in no acute distress. The vital signs are documented above. CARDIOVASCULAR: Plus radial and 2+ popliteal pulses 2+ femoral pulses with no evidence of peripheral aneurysm.  I do not palpate an abdominal aortic aneurysm PULMONARY: There is good air exchange  MUSCULOSKELETAL: There are no major deformities or cyanosis. NEUROLOGIC: No focal weakness or paresthesias are detected. SKIN: There are no ulcers or rashes noted. PSYCHIATRIC: The patient has a normal affect.  DATA:  Ultrasound today suggest 4.9 cm infrarenal abdominal aortic aneurysm  MEDICAL ISSUES: I discussed these findings with patient.  He has had some growth since September with CT versus ultrasound.  I have recommended that we see him in 6 months with CT angiogram for continued evaluation.  He does note symptoms of aneurysm and the need to report immediately should this occur.    Larina Earthly, MD FACS [facility] [phone]  Note: Portions of this report may have been transcribed using voice recognition software.  Every effort has been made to ensure accuracy; however, inadvertent computerized transcription errors may still be present.

## 2022-06-22 ENCOUNTER — Other Ambulatory Visit: Payer: Self-pay | Admitting: Family Medicine

## 2022-06-22 DIAGNOSIS — F419 Anxiety disorder, unspecified: Secondary | ICD-10-CM

## 2022-06-22 LAB — CEA: CEA: 4.2 ng/mL (ref 0.0–4.7)

## 2022-06-22 MED ORDER — ALPRAZOLAM 0.25 MG PO TABS
0.2500 mg | ORAL_TABLET | Freq: Two times a day (BID) | ORAL | 0 refills | Status: DC | PRN
Start: 2022-06-22 — End: 2022-09-22

## 2022-06-25 NOTE — Progress Notes (Signed)
[facility] [address] [place] [address]    Clinic Day:  06/26/2022  Referring physician: [name], FNP  Patient Care Team: [name], FNP as PCP - General (Family Medicine) [name], MD (Gastroenterology) [name], MD as Medical Oncologist (Medical Oncology) [name], RN as Oncology Nurse Navigator (Medical Oncology)   ASSESSMENT & PLAN:   Assessment: 1.  Stage II (PT3 N0) ascending colon adenocarcinoma: - Colonoscopy (09/28/2021): Exophytic apple core appearing mass in the proximal colon.  Location in relation to the hepatic flexure or cecum could not be ascertained with certainty.  Nonbleeding internal hemorrhoids. - CEA on 09/28/2021-2.8 - CT AP (10/12/2021): Apple core lesion in the hepatic flexure with no evidence of metastatic disease in the abdomen or pelvis. - Laparoscopic right hemicolectomy by Dr. Henreitta Leber on 11/02/2021 - Pathology: Moderately differentiated adenocarcinoma extending into pericolonic connective tissue, margins negative, 0/24 lymph nodes involved, negative LVI and PNI, MMR preserved. - Personal history of sarcoidosis from a left axillary lymph node biopsy prior to 2000   2.  Social/family history: - He lives at home with his wife.  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  He smoked a pipe briefly for less than a year but was never cigarette smoker.  Maternal uncle had esophageal cancer.    Plan: 1.  Stage II (PT3N0) ascending colon adenocarcinoma: - He does not report any change in bowel habits or bleeding per rectum. - CEA is 4.2.  LFTs are normal.  CBC was normal. - CTAP (06/20/2022): Stable postsurgical changes with no evidence of recurrence.  Infrarenal abdominal aortic aneurysm measures 4.6 x 4.4 cm. - RTC 3 months with repeat CBC, CEA and CMP.  We will repeat CT scan in 6 months.   2.  Normocytic anemia: - He was taking iron tablet daily and stopped  it 3 weeks ago. - We reviewed ferritin which has increased to 38 from 22 previously. - He was told to restart iron 1 tablet daily which he tolerated well.    Orders Placed This Encounter  Procedures   CBC with Differential/Platelet    Standing Status:   Future    Standing Expiration Date:   06/26/2023    Order Specific Question:   Release to patient    Answer:   Immediate   Comprehensive metabolic panel    Standing Status:   Future    Standing Expiration Date:   06/26/2023    Order Specific Question:   Release to patient    Answer:   Immediate   Ferritin    Standing Status:   Future    Standing Expiration Date:   06/26/2023    Order Specific Question:   Release to patient    Answer:   Immediate   Iron and TIBC    Standing Status:   Future    Standing Expiration Date:   06/26/2023    Order Specific Question:   Release to patient    Answer:   Immediate   CEA    Standing Status:   Future    Standing Expiration Date:   06/26/2023      I,[name],acting as a scribe for [name], MD.,have documented all relevant documentation on the behalf of [name], MD,as directed by  [name], MD while in the presence of [name], MD.   I, [name] MD, have reviewed the above documentation for accuracy and completeness, and I  agree with the above.   [name], MD   4/29/20244:05 PM  CHIEF COMPLAINT:   Diagnosis: stage II right colon cancer    Cancer Staging  No matching staging information was found for the patient.   Prior Therapy: Right hemicolectomy on 11/02/2021   Current Therapy:  Surveillance    HISTORY OF PRESENT ILLNESS:   Oncology History   No history exists.     INTERVAL HISTORY:   Albert Gonzalez is a 72 y.o. male presenting to clinic today for follow up of stage II right colon cancer. He was last seen by me on 03/23/22.  Since his last visit, he underwent surveillance CT A/P on 06/20/22 showing: no  findings for recurrent tumor, locoregional adenopathy, or distant metastatic disease.   Today, he states that he is doing well overall. His appetite level is at 100%. His energy level is at 40%.  PAST MEDICAL HISTORY:   Past Medical History: Past Medical History:  Diagnosis Date   Anxiety    Arthritis    Cancer (HCC)    COPD (chronic obstructive pulmonary disease) (HCC)    Depression    History of kidney stones    Hyperlipidemia    Inguinal hernia right    Insomnia    Myalgia    PPD positive, treated 1979   1 year of INH   Raynaud phenomenon    Restless leg syndrome    Sarcoidosis 1980    Surgical History: Past Surgical History:  Procedure Laterality Date   AXILLARY LYMPH NODE BIOPSY Right 1980   BIOPSY  09/28/2021   Procedure: BIOPSY;  Surgeon: Corbin Ade, MD;  [facility];  Service: Endoscopy;;   COLON SURGERY     COLONOSCOPY  02/08/2011   Procedure: COLONOSCOPY;  Surgeon: Corbin Ade, MD;  [facility];  Service: Endoscopy;  Laterality: N/A;  1:30 PM   COLONOSCOPY WITH PROPOFOL N/A 09/28/2021   Procedure: COLONOSCOPY WITH PROPOFOL;  Surgeon: Corbin Ade, MD;  [facility];  Service: Endoscopy;  Laterality: N/A;  2:15pm   INGUINAL HERNIA REPAIR Right 01/29/2017   Procedure: HERNIA REPAIR INGUINAL ADULT WITH MESH;  Surgeon: Lucretia Roers, MD;  Location: [facility];  Service: General;  Laterality: Right;   KNEE ARTHROSCOPY WITH MEDIAL MENISECTOMY Right 04/05/2016   Procedure: RIGHT KNEE ARTHROSCOPY WITH PARTIAL  LATERAL MENISECTOMY, DEBRIDEMENT MEDIAL MENISCUS, ACL DEBRIDEMENT, MICRO FRACTURE OF FEMUR;  Surgeon: Vickki Hearing, MD;  Location: [facility];  Service: Orthopedics;  Laterality: Right;   KNEE SURGERY     left   KYPHOPLASTY     LAPAROSCOPIC PARTIAL COLECTOMY N/A 11/02/2021   Procedure: LAPAROSCOPIC EXTENDED RIGHT HEMICOLECTOMY;  Surgeon: Lucretia Roers, MD;  Location: [facility];  Service: General;  Laterality: N/A;    POLYPECTOMY  09/28/2021   Procedure: POLYPECTOMY;  Surgeon: Corbin Ade, MD;  [facility];  Service: Endoscopy;;   TONSILLECTOMY     TOTAL KNEE ARTHROPLASTY Right 02/08/2021   Procedure: TOTAL KNEE ARTHROPLASTY;  Surgeon: Vickki Hearing, MD;  Location: [facility];  Service: Orthopedics;  Laterality: Right;    Social History: Social History   Socioeconomic History   Marital status: Married    Spouse name: Not on file   Number of children: 1   Years of education: Not on file   Highest education level: Not on file  Occupational History   Occupation: respiratory therapist    Employer: [employer]  Tobacco Use  Smoking status: Former    Packs/day: 0.50    Years: 20.00    Additional pack years: 0.00    Total pack years: 10.00    Types: Pipe, Cigarettes    Quit date: 04/03/1990    Years since quitting: 32.2   Smokeless tobacco: Never   Tobacco comments:    quit many years ago  Vaping Use   Vaping Use: Never used  Substance and Sexual Activity   Alcohol use: Not Currently    Comment: occassional   Drug use: No   Sexual activity: Yes  Other Topics Concern   Not on file  Social History Narrative   Not on file   Social Determinants of Health   Financial Resource Strain: Not on file  Food Insecurity: No Food Insecurity (11/02/2021)   Hunger Vital Sign    Worried About Running Out of Food in the Last Year: Never true    Ran Out of Food in the Last Year: Never true  Transportation Needs: No Transportation Needs (11/02/2021)   PRAPARE - Administrator, Civil Service (Medical): No    Lack of Transportation (Non-Medical): No  Physical Activity: Not on file  Stress: Not on file  Social Connections: Not on file  Intimate Partner Violence: Not At Risk (11/02/2021)   Humiliation, Afraid, Rape, and Kick questionnaire    Fear of Current or Ex-Partner: No    Emotionally Abused: No    Physically Abused: No    Sexually Abused: No     Family History: Family History  Problem Relation Age of Onset   Suicidality Father    Colon cancer Neg Hx    Liver disease Neg Hx    Inflammatory bowel disease Neg Hx    Anesthesia problems Neg Hx     Current Medications:  Current Outpatient Medications:    albuterol (VENTOLIN HFA) 108 (90 Base) MCG/ACT inhaler, INHALE ONE PUFF INTO THE LUNGS FOUR TIMES DAILY AS NEEDED, Disp: 8.5 g, Rfl: 1   ALPRAZolam (XANAX) 0.25 MG tablet, Take 1 tablet (0.25 mg total) by mouth 2 (two) times daily as needed for anxiety., Disp: 20 tablet, Rfl: 0   aspirin 325 MG tablet, Take 325 mg by mouth daily., Disp: , Rfl:    atorvastatin (LIPITOR) 40 MG tablet, TAKE ONE TABLET BY MOUTH EVERY NIGHT AT BEDTIME, Disp: 90 tablet, Rfl: 0   busPIRone (BUSPAR) 10 MG tablet, TAKE ONE (1) TABLET BY MOUTH 3 TIMES DAILY, Disp: 90 tablet, Rfl: 1   busPIRone (BUSPAR) 10 MG tablet, Take 1 tablet (10 mg total) by mouth 3 (three) times daily., Disp: 90 tablet, Rfl: 1   Calcium Citrate-Vitamin D (CALCIUM + D PO), Take 2 tablets by mouth daily., Disp: , Rfl:    diclofenac (VOLTAREN) 75 MG EC tablet, TAKE ONE TABLET BY MOUTH TWICE DAILY AS NEEDED, Disp: 60 tablet, Rfl: 0   DULoxetine (CYMBALTA) 60 MG capsule, TAKE 1 (ONE) CAPSULE BY MOUTH TWO TIMES DAILY, Disp: 180 capsule, Rfl: 1   fluticasone-salmeterol (ADVAIR) 250-50 MCG/ACT AEPB, Inhale 1 puff into the lungs in the morning and at bedtime., Disp: 60 each, Rfl: 1   ibuprofen (ADVIL) 200 MG tablet, Take 800 mg by mouth daily., Disp: , Rfl:    Misc Natural Products (GLUCOSAMINE CHOND DOUBLE STR PO), Take 2 tablets by mouth daily., Disp: , Rfl:    Multiple Vitamin (MULTIVITAMIN) tablet, Take 1 tablet by mouth daily. Men's 50+, Disp: , Rfl:    polyethylene glycol (MIRALAX / GLYCOLAX) 17  g packet, Take 17 g by mouth daily as needed for mild constipation. (Patient taking differently: Take 17 g by mouth daily.), Disp: 14 each, Rfl: 0   tiZANidine (ZANAFLEX) 4 MG tablet, Take 1  tablet (4 mg total) by mouth daily., Disp: 30 tablet, Rfl: 1   Allergies: Allergies  Allergen Reactions   Sulfa Antibiotics Rash    Childhood     REVIEW OF SYSTEMS:   Review of Systems  Constitutional:  Negative for chills, fatigue and fever.  HENT:   Negative for lump/mass, mouth sores, nosebleeds, sore throat and trouble swallowing.   Eyes:  Negative for eye problems.  Respiratory:  Positive for cough and shortness of breath.   Cardiovascular:  Negative for chest pain, leg swelling and palpitations.  Gastrointestinal:  Negative for abdominal pain, constipation, diarrhea, nausea and vomiting.  Genitourinary:  Negative for bladder incontinence, difficulty urinating, dysuria, frequency, hematuria and nocturia.   Musculoskeletal:  Negative for arthralgias, back pain, flank pain, myalgias and neck pain.  Skin:  Negative for itching and rash.  Neurological:  Negative for dizziness, headaches and numbness.  Hematological:  Does not bruise/bleed easily.  Psychiatric/Behavioral:  Positive for depression. Negative for sleep disturbance and suicidal ideas. The patient is nervous/anxious.   All other systems reviewed and are negative.    VITALS:   Blood pressure (!) 134/96, pulse 85, temperature 98.9 F (37.2 C), temperature source Tympanic, resp. rate 18, weight 176 lb 3.2 oz (79.9 kg), SpO2 97 %.  Wt Readings from Last 3 Encounters:  06/26/22 176 lb 3.2 oz (79.9 kg)  06/21/22 174 lb (78.9 kg)  04/18/22 175 lb 3.2 oz (79.5 kg)    Body mass index is 24.57 kg/m.  Performance status (ECOG): 1 - Symptomatic but completely ambulatory  PHYSICAL EXAM:   Physical Exam Vitals and nursing note reviewed. Exam conducted with a chaperone present.  Constitutional:      Appearance: Normal appearance.  Cardiovascular:     Rate and Rhythm: Normal rate and regular rhythm.     Pulses: Normal pulses.     Heart sounds: Normal heart sounds.  Pulmonary:     Effort: Pulmonary effort is normal.      Breath sounds: Normal breath sounds.  Abdominal:     Palpations: Abdomen is soft. There is no hepatomegaly, splenomegaly or mass.     Tenderness: There is no abdominal tenderness.  Musculoskeletal:     Right lower leg: No edema.     Left lower leg: No edema.  Lymphadenopathy:     Cervical: No cervical adenopathy.     Right cervical: No superficial, deep or posterior cervical adenopathy.    Left cervical: No superficial, deep or posterior cervical adenopathy.     Upper Body:     Right upper body: No supraclavicular or axillary adenopathy.     Left upper body: No supraclavicular or axillary adenopathy.  Neurological:     General: No focal deficit present.     Mental Status: He is alert and oriented to person, place, and time.  Psychiatric:        Mood and Affect: Mood normal.        Behavior: Behavior normal.     LABS:      Latest Ref Rng & Units 06/20/2022   12:24 PM 03/16/2022    1:35 PM 11/14/2021    2:43 PM  CBC  WBC 4.0 - 10.5 K/uL 8.6  8.1  11.9   Hemoglobin 13.0 - 17.0 g/dL 16.1  16.0  10.8   Hematocrit 39.0 - 52.0 % 50.1  49.8  36.6   Platelets 150 - 400 K/uL 291  266  589       Latest Ref Rng & Units 06/20/2022   12:24 PM 03/16/2022    1:35 PM 11/14/2021    2:43 PM  CMP  Glucose 70 - 99 mg/dL 90  95  91   BUN 8 - 23 mg/dL 20  17  14    Creatinine 0.61 - 1.24 mg/dL 4.03  4.74  2.59   Sodium 135 - 145 mmol/L 137  135  138   Potassium 3.5 - 5.1 mmol/L 4.8  4.2  5.0   Chloride 98 - 111 mmol/L 101  99  100   CO2 22 - 32 mmol/L 28  25  24    Calcium 8.9 - 10.3 mg/dL 9.7  9.3  9.8   Total Protein 6.5 - 8.1 g/dL 7.7  7.4    Total Bilirubin 0.3 - 1.2 mg/dL 0.2  0.8    Alkaline Phos 38 - 126 U/L 101  117    AST 15 - 41 U/L 26  30    ALT 0 - 44 U/L 24  29       Lab Results  Component Value Date   CEA1 4.2 06/20/2022   /  CEA  Date Value Ref Range Status  06/20/2022 4.2 0.0 - 4.7 ng/mL Final    Comment:    (NOTE)                             Nonsmokers           <3.9                             Smokers             <5.6 Roche [employer]ostics Electrochemiluminescence Immunoassay (ECLIA) Values obtained with different assay methods or kits cannot be used interchangeably.  Results cannot be interpreted as absolute evidence of the presence or absence of malignant disease. Performed At: [facility] [address] [place], [place] [id number] Jolene Schimke MD PI:[id number]    No results found for: "PSA1" No results found for: "CAN199" No results found for: "CAN125"  No results found for: "TOTALPROTELP", "ALBUMINELP", "A1GS", "A2GS", "BETS", "BETA2SER", "GAMS", "MSPIKE", "SPEI" Lab Results  Component Value Date   TIBC 432 06/20/2022   TIBC 450 03/16/2022   FERRITIN 38 06/20/2022   FERRITIN 22 (L) 03/16/2022   IRONPCTSAT 22 06/20/2022   IRONPCTSAT 24 03/16/2022   No results found for: "LDH"   STUDIES:   CT Abdomen Pelvis W Contrast  Result Date: 06/23/2022 CLINICAL DATA:  Restaging colon cancer.  * Tracking Code: BO * EXAM: CT ABDOMEN AND PELVIS WITH CONTRAST TECHNIQUE: Multidetector CT imaging of the abdomen and pelvis was performed using the standard protocol following bolus administration of intravenous contrast. RADIATION DOSE REDUCTION: This exam was performed according to the departmental dose-optimization program which includes automated exposure control, adjustment of the mA and/or kV according to patient size and/or use of iterative reconstruction technique. CONTRAST:  OMNIPAQUE IOHEXOL 300 MG/ML  SOLN COMPARISON:  CT scan 10/12/2021 FINDINGS: Lower chest: The heart is normal in size. No pericardial effusion. Aortic and coronary artery calcifications are noted. Fusiform aneurysmal dilatation of the ascending thoracic aorta with maximum measurement of 4.0 cm. Recommend annual imaging followup by CTA or MRA.  This recommendation follows 2010 ACCF/AHA/AATS/ACR/ASA/SCA/SCAI/SIR/STS/SVM Guidelines for the Diagnosis and Management of  Patients with Thoracic Aortic Disease. Circulation. 2010; 121: Q034-V425. Aortic aneurysm NOS (ICD10-I71.9). Stable appearing changes of interstitial lung disease with pulmonary fibrosis. No pleural effusions or focal infiltrates. Hepatobiliary: No worrisome hepatic lesions to suggest metastatic disease. No intrahepatic biliary dilatation. The gallbladder is unremarkable. No common bile dilatation. Pancreas: No mass, inflammation or ductal dilatation. Spleen: Normal size.  No focal lesions. Adrenals/Urinary Tract: The adrenal glands are normal. No renal lesions, renal calculi or hydronephrosis. The bladder is unremarkable. Stomach/Bowel: The stomach, duodenum, small bowel and are. No acute inflammatory process, mass lesions or obstructive findings. Stable surgical changes from right hemicolectomy. Vascular/Lymphatic: Infrarenal abdominal aortic aneurysm with maximum measurements of 4.6 x 4.4 cm. This represents a slight enlargement since the prior study where it measured 4.3 x 4.3 cm. Recommend follow-up CT/MR every 6 months and vascular consultation. This recommendation follows ACR consensus guidelines: White Paper of the ACR Incidental Findings Committee II on Vascular Findings. J Am Coll Radiol 2013; 10:789-794. the aortic branch vessels are patent. Scattered atherosclerotic calcifications. The major venous structures are patent. No mesenteric or retroperitoneal mass or lymphadenopathy. Reproductive: The prostate gland and seminal vesicles unremarkable. Other: No pelvic mass or adenopathy. No free pelvic fluid collections. No inguinal mass or adenopathy. No abdominal wall hernia or subcutaneous lesions. Soft tissue density in the right inguinal canal is likely related to prior hernia repair. There is a left inguinal hernia containing fat. Musculoskeletal: No significant bony findings. IMPRESSION: 1. Stable surgical changes from right hemicolectomy. No findings for recurrent tumor, locoregional adenopathy or  distant metastatic disease. 2. Infrarenal abdominal aortic aneurysm with maximum measurements of 4.6 x 4.4 cm. This represents a slight enlargement since the prior study where it measured 4.3 x 4.3 cm. Recommend follow-up CT/MR every 6 months and vascular consultation. 3. Fusiform aneurysmal dilatation of the ascending thoracic aorta with maximum measurement of 4.0 cm. Aortic Atherosclerosis (ICD10-I70.0). Electronically Signed   By: Rudie Meyer M.D.   On: 06/23/2022 08:39   VAS [place] AAA DUPLEX  Result Date: 06/21/2022 ABDOMINAL AORTA STUDY Patient Name:  Albert Gonzalez  Date of Exam:   06/21/2022 Medical Rec #: [id number]       Accession #:    [id number] Date of Birth: [birth year]       Patient Gender: M Patient Age:   3 years Exam Location:  [facility] Procedure:      VAS [place] AAA DUPLEX Referring Phys: TODD EARLY --------------------------------------------------------------------------------  Indications: Follow up exam for known AAA. Risk Factors: Past history of smoking.  Comparison Study: CT 10/12/21: AAA 4.5 cm Performing Technologist: Thereasa Parkin RVT  Examination Guidelines: A complete evaluation includes B-mode imaging, spectral Doppler, color Doppler, and power Doppler as needed of all accessible portions of each vessel. Bilateral testing is considered an integral part of a complete examination. Limited examinations for reoccurring indications may be performed as noted.  Abdominal Aorta Findings: +-------------+-------+----------+----------+----------+--------+--------+ Location     AP (cm)Trans (cm)PSV (cm/s)Waveform  ThrombusComments +-------------+-------+----------+----------+----------+--------+--------+ Proximal     1.59   1.67      71                                   +-------------+-------+----------+----------+----------+--------+--------+ Mid          4.40   4.94      92                                    +-------------+-------+----------+----------+----------+--------+--------+  Distal       1.77   1.93      61                                   +-------------+-------+----------+----------+----------+--------+--------+ RT CIA Prox  1.2    1.3       60        biphasic                   +-------------+-------+----------+----------+----------+--------+--------+ RT CIA Mid                    67        monophasic                 +-------------+-------+----------+----------+----------+--------+--------+ RT CIA Distal                 66        biphasic                   +-------------+-------+----------+----------+----------+--------+--------+ RT EIA Prox                   104       triphasic                  +-------------+-------+----------+----------+----------+--------+--------+ RT EIA Mid                    88        triphasic                  +-------------+-------+----------+----------+----------+--------+--------+ RT EIA Distal                 61        biphasic                   +-------------+-------+----------+----------+----------+--------+--------+ LT CIA Prox  1.1    1.0       73        triphasic                  +-------------+-------+----------+----------+----------+--------+--------+ LT CIA Mid                    67        triphasic                  +-------------+-------+----------+----------+----------+--------+--------+ LT CIA Distal                 76        triphasic                  +-------------+-------+----------+----------+----------+--------+--------+ LT EIA Prox                   38        monophasic                 +-------------+-------+----------+----------+----------+--------+--------+ LT EIA Mid                    83        biphasic                   +-------------+-------+----------+----------+----------+--------+--------+ LT EIA Distal                 72        triphasic                   +-------------+-------+----------+----------+----------+--------+--------+  Summary: Abdominal Aorta: There is evidence of abnormal dilatation of the mid Abdominal aorta. The largest aortic diameter has increased compared to prior exam. Previous diameter measurement was 4.5 cm obtained on 10/12/21.  *See table(s) above for measurements and observations.  Electronically signed by Lemar Livings MD on 06/21/2022 at 10:28:09 AM.    Final

## 2022-06-26 ENCOUNTER — Inpatient Hospital Stay: Payer: Medicare HMO | Admitting: Hematology

## 2022-06-26 VITALS — BP 134/96 | HR 85 | Temp 98.9°F | Resp 18 | Wt 176.2 lb

## 2022-06-26 DIAGNOSIS — Z85038 Personal history of other malignant neoplasm of large intestine: Secondary | ICD-10-CM | POA: Diagnosis not present

## 2022-06-26 DIAGNOSIS — Z08 Encounter for follow-up examination after completed treatment for malignant neoplasm: Secondary | ICD-10-CM | POA: Diagnosis not present

## 2022-06-26 DIAGNOSIS — C184 Malignant neoplasm of transverse colon: Secondary | ICD-10-CM | POA: Diagnosis not present

## 2022-06-26 DIAGNOSIS — D649 Anemia, unspecified: Secondary | ICD-10-CM | POA: Diagnosis not present

## 2022-06-26 NOTE — Patient Instructions (Addendum)
[facility]  Discharge Instructions  You were seen and examined today by Dr. Ellin Saba.  Dr. Ellin Saba discussed your most recent lab work which revealed everything looks good except your ferritin level is 38.  Continue taking oral iron once daily.  Follow-up as scheduled in 3 months.    Thank you for choosing [facility] to provide your oncology and hematology care.   To afford each patient quality time with our provider, please arrive at least 15 minutes before your scheduled appointment time. You may need to reschedule your appointment if you arrive late (10 or more minutes). Arriving late affects you and other patients whose appointments are after yours.  Also, if you miss three or more appointments without notifying the office, you may be dismissed from the clinic at the provider's discretion.    Again, thank you for choosing [facility].  Our hope is that these requests will decrease the amount of time that you wait before being seen by our physicians.   If you have a lab appointment with the Cancer Center - please note that after April 8th, all labs will be drawn in the cancer center.  You do not have to check in or register with the main entrance as you have in the past but will complete your check-in at the cancer center.            _____________________________________________________________  Should you have questions after your visit to [facility], please contact our office at [phone] and follow the prompts.  Our office hours are 8:00 a.m. to 4:30 p.m. Monday - Thursday and 8:00 a.m. to 2:30 p.m. Friday.  Please note that voicemails left after 4:00 p.m. may not be returned until the following business day.  We are closed weekends and all major holidays.  You do have access to a nurse 24-7, just call the main number to the clinic [phone] and do not press any options, hold on the line and  a nurse will answer the phone.    For prescription refill requests, have your pharmacy contact our office and allow 72 hours.    Masks are no longer required in the cancer centers. If you would like for your care team to wear a mask while they are taking care of you, please let them know. You may have one support person who is at least 73 years old accompany you for your appointments.

## 2022-07-03 ENCOUNTER — Ambulatory Visit: Payer: Medicare HMO | Admitting: Family Medicine

## 2022-07-11 ENCOUNTER — Ambulatory Visit (INDEPENDENT_AMBULATORY_CARE_PROVIDER_SITE_OTHER): Payer: Medicare HMO | Admitting: Family Medicine

## 2022-07-11 ENCOUNTER — Encounter: Payer: Self-pay | Admitting: Family Medicine

## 2022-07-11 ENCOUNTER — Ambulatory Visit (HOSPITAL_COMMUNITY)
Admission: RE | Admit: 2022-07-11 | Discharge: 2022-07-11 | Disposition: A | Discharge status: Home / Self Care | Payer: Medicare HMO | Source: Ambulatory Visit | Attending: Family Medicine | Admitting: Family Medicine

## 2022-07-11 VITALS — BP 130/83 | HR 82 | Wt 175.1 lb

## 2022-07-11 DIAGNOSIS — K219 Gastro-esophageal reflux disease without esophagitis: Secondary | ICD-10-CM

## 2022-07-11 DIAGNOSIS — E038 Other specified hypothyroidism: Secondary | ICD-10-CM | POA: Diagnosis not present

## 2022-07-11 DIAGNOSIS — G8929 Other chronic pain: Secondary | ICD-10-CM

## 2022-07-11 DIAGNOSIS — M79645 Pain in left finger(s): Secondary | ICD-10-CM | POA: Insufficient documentation

## 2022-07-11 DIAGNOSIS — M19012 Primary osteoarthritis, left shoulder: Secondary | ICD-10-CM | POA: Diagnosis not present

## 2022-07-11 DIAGNOSIS — E559 Vitamin D deficiency, unspecified: Secondary | ICD-10-CM

## 2022-07-11 DIAGNOSIS — R7301 Impaired fasting glucose: Secondary | ICD-10-CM | POA: Diagnosis not present

## 2022-07-11 DIAGNOSIS — E7849 Other hyperlipidemia: Secondary | ICD-10-CM | POA: Diagnosis not present

## 2022-07-11 DIAGNOSIS — M25512 Pain in left shoulder: Secondary | ICD-10-CM

## 2022-07-11 MED ORDER — MELOXICAM 7.5 MG PO TABS
7.5000 mg | ORAL_TABLET | Freq: Every day | ORAL | 0 refills | Status: DC
Start: 2022-07-11 — End: 2022-09-18

## 2022-07-11 MED ORDER — FAMOTIDINE 10 MG PO TABS
10.0000 mg | ORAL_TABLET | Freq: Every day | ORAL | 0 refills | Status: DC
Start: 2022-07-11 — End: 2023-07-31

## 2022-07-11 NOTE — Assessment & Plan Note (Signed)
Onset of symptoms since 2023 Reports trauma to the left shoulder that precipitated the onset of his pain Pain is worse with activity and relieved with rest Pain is worse when lying on the affected side Increased pain in the left shoulder has affected his ability to work in his yard Pain is not radiating No numbness or tingling in the affected site reported No stiffness reported Range of motion is intact with tenderness noted We will get imaging study of the left shoulder Referral placed to [facility] for further evaluation Will treat today with meloxicam 7.5 mg daily Encouraged to take Pepcid for GI prophylaxis

## 2022-07-11 NOTE — Patient Instructions (Addendum)
I appreciate the opportunity to provide care to you today!    Follow up:  3 months  Labs: please stop by the lab tomorrow to get your blood drawn (CMP, TSH, Lipid profile, HgA1c, Vit D)  Here are some tips for helping shoulder pain get better:  Put ice on the shoulder area for 15 minutes, then leave it off for 15 minutes. Do this 3 to 4 times a day for 2 to 3 days. Wrap the ice in cloth. Do not put ice directly on the skin because this can result in frostbite. Rest your shoulder for the next few days. Slowly return to your regular activities Take meloxicam 7.5 to help reduce inflammation and pain Take Pepcid 10 mg daily while taking meloxicam 7.5 mg daily for GI bleeding  prophylaxis  Please stop by [facility] hospital anytime to get an x-ray of of the left shoulder  Referrals today- Orthopedic surgery ( [facility])   Please continue to a heart-healthy diet and increase your physical activities. Try to exercise for at least five days a week.      It was a pleasure to see you and I look forward to continuing to work together on your health and well-being. Please do not hesitate to call the office if you need care or have questions about your care.   Have a wonderful day and week. With Gratitude, [name] MSN, FNP-BC

## 2022-07-11 NOTE — Assessment & Plan Note (Signed)
Images of the left thumb revealed arthritis in the joints of the left thumb Will treat today with meloxicam 7.5 mg daily Referral placed to orthopedic surgery

## 2022-07-11 NOTE — Progress Notes (Signed)
Established Patient Office Visit  Subjective:  Patient ID: Albert Gonzalez, male    DOB: 1951-02-19  Age: 72 y.o. MRN: [id number]  CC:  Chief Complaint  Patient presents with   Chronic Care Management    Follow up appt   Hand Pain    Pt reports thumb pain on left hand and sometimes radiates up to shoulder. Has been using brace at times.     HPI Albert Gonzalez is a 72 y.o. male with past medical history of anxiety and depression, hyperlipidemia, and left thumb pain presents for f/u of  chronic medical conditions. For the details of today's visit, please refer to the assessment and plan.     Past Medical History:  Diagnosis Date   Anxiety    Arthritis    Cancer (HCC)    COPD (chronic obstructive pulmonary disease) (HCC)    Depression    History of kidney stones    Hyperlipidemia    Inguinal hernia right    Insomnia    Myalgia    PPD positive, treated 1979   1 year of INH   Raynaud phenomenon    Restless leg syndrome    Sarcoidosis 1980    Past Surgical History:  Procedure Laterality Date   AXILLARY LYMPH NODE BIOPSY Right 1980   BIOPSY  09/28/2021   Procedure: BIOPSY;  Surgeon: Corbin Ade, MD;  [facility];  Service: Endoscopy;;   COLON SURGERY     COLONOSCOPY  02/08/2011   Procedure: COLONOSCOPY;  Surgeon: Corbin Ade, MD;  [facility]: [facility];  Service: Endoscopy;  Laterality: N/A;  1:30 PM   COLONOSCOPY WITH PROPOFOL N/A 09/28/2021   Procedure: COLONOSCOPY WITH PROPOFOL;  Surgeon: Corbin Ade, MD;  Location: [facility];  Service: Endoscopy;  Laterality: N/A;  2:15pm   INGUINAL HERNIA REPAIR Right 01/29/2017   Procedure: HERNIA REPAIR INGUINAL ADULT WITH MESH;  Surgeon: Lucretia Roers, MD;  Location: [facility];  Service: General;  Laterality: Right;   KNEE ARTHROSCOPY WITH MEDIAL MENISECTOMY Right 04/05/2016   Procedure: RIGHT KNEE ARTHROSCOPY WITH PARTIAL  LATERAL MENISECTOMY, DEBRIDEMENT MEDIAL MENISCUS, ACL DEBRIDEMENT, MICRO  FRACTURE OF FEMUR;  Surgeon: Vickki Hearing, MD;  Location: [facility];  Service: Orthopedics;  Laterality: Right;   KNEE SURGERY     left   KYPHOPLASTY     LAPAROSCOPIC PARTIAL COLECTOMY N/A 11/02/2021   Procedure: LAPAROSCOPIC EXTENDED RIGHT HEMICOLECTOMY;  Surgeon: Lucretia Roers, MD;  Location: [facility];  Service: General;  Laterality: N/A;   POLYPECTOMY  09/28/2021   Procedure: POLYPECTOMY;  Surgeon: Corbin Ade, MD;  [facility];  Service: Endoscopy;;   TONSILLECTOMY     TOTAL KNEE ARTHROPLASTY Right 02/08/2021   Procedure: TOTAL KNEE ARTHROPLASTY;  Surgeon: Vickki Hearing, MD;  Location: [facility];  Service: Orthopedics;  Laterality: Right;    Family History  Problem Relation Age of Onset   Suicidality Father    Colon cancer Neg Hx    Liver disease Neg Hx    Inflammatory bowel disease Neg Hx    Anesthesia problems Neg Hx     Social History   Socioeconomic History   Marital status: Married    Spouse name: Not on file   Number of children: 1   Years of education: Not on file   Highest education level: Not on file  Occupational History   Occupation: respiratory therapist    Employer: [employer]  Tobacco  Use   Smoking status: Former    Packs/day: 0.50    Years: 20.00    Additional pack years: 0.00    Total pack years: 10.00    Types: Pipe, Cigarettes    Quit date: 04/03/1990    Years since quitting: 32.2   Smokeless tobacco: Never   Tobacco comments:    quit many years ago  Vaping Use   Vaping Use: Never used  Substance and Sexual Activity   Alcohol use: Not Currently    Comment: occassional   Drug use: No   Sexual activity: Yes  Other Topics Concern   Not on file  Social History Narrative   Not on file   Social Determinants of Health   Financial Resource Strain: Not on file  Food Insecurity: No Food Insecurity (11/02/2021)   Hunger Vital Sign    Worried About Running Out of Food in the Last Year: Never true    Ran  Out of Food in the Last Year: Never true  Transportation Needs: No Transportation Needs (11/02/2021)   PRAPARE - Administrator, Civil Service (Medical): No    Lack of Transportation (Non-Medical): No  Physical Activity: Not on file  Stress: Not on file  Social Connections: Not on file  Intimate Partner Violence: Not At Risk (11/02/2021)   Humiliation, Afraid, Rape, and Kick questionnaire    Fear of Current or Ex-Partner: No    Emotionally Abused: No    Physically Abused: No    Sexually Abused: No    Outpatient Medications Prior to Visit  Medication Sig Dispense Refill   albuterol (VENTOLIN HFA) 108 (90 Base) MCG/ACT inhaler INHALE ONE PUFF INTO THE LUNGS FOUR TIMES DAILY AS NEEDED 8.5 g 1   ALPRAZolam (XANAX) 0.25 MG tablet Take 1 tablet (0.25 mg total) by mouth 2 (two) times daily as needed for anxiety. 20 tablet 0   aspirin 325 MG tablet Take 325 mg by mouth daily.     atorvastatin (LIPITOR) 40 MG tablet TAKE ONE TABLET BY MOUTH EVERY NIGHT AT BEDTIME 90 tablet 0   busPIRone (BUSPAR) 10 MG tablet TAKE ONE (1) TABLET BY MOUTH 3 TIMES DAILY 90 tablet 1   busPIRone (BUSPAR) 10 MG tablet Take 1 tablet (10 mg total) by mouth 3 (three) times daily. 90 tablet 1   Calcium Citrate-Vitamin D (CALCIUM + D PO) Take 2 tablets by mouth daily.     diclofenac (VOLTAREN) 75 MG EC tablet TAKE ONE TABLET BY MOUTH TWICE DAILY AS NEEDED 60 tablet 0   DULoxetine (CYMBALTA) 60 MG capsule TAKE 1 (ONE) CAPSULE BY MOUTH TWO TIMES DAILY 180 capsule 1   ferrous sulfate 325 (65 FE) MG tablet Take 325 mg by mouth daily with breakfast.     fluticasone-salmeterol (ADVAIR) 250-50 MCG/ACT AEPB Inhale 1 puff into the lungs in the morning and at bedtime. 60 each 1   Misc Natural Products (GLUCOSAMINE CHOND DOUBLE STR PO) Take 2 tablets by mouth daily.     Multiple Vitamin (MULTIVITAMIN) tablet Take 1 tablet by mouth daily. Men's 50+     polyethylene glycol (MIRALAX / GLYCOLAX) 17 g packet Take 17 g by mouth  daily as needed for mild constipation. (Patient taking differently: Take 17 g by mouth daily.) 14 each 0   tiZANidine (ZANAFLEX) 4 MG tablet Take 1 tablet (4 mg total) by mouth daily. 30 tablet 1   ibuprofen (ADVIL) 200 MG tablet Take 800 mg by mouth daily.     No  facility-administered medications prior to visit.    Allergies  Allergen Reactions   Sulfa Antibiotics Rash    Childhood     ROS Review of Systems  Constitutional:  Negative for fatigue and fever.  Eyes:  Negative for visual disturbance.  Respiratory:  Negative for chest tightness and shortness of breath.   Cardiovascular:  Negative for chest pain and palpitations.  Musculoskeletal:        Left shoulder pain, left thumb pain  Neurological:  Negative for dizziness and headaches.      Objective:    Physical Exam HENT:     Head: Normocephalic.     Right Ear: External ear normal.     Left Ear: External ear normal.     Nose: No congestion or rhinorrhea.     Mouth/Throat:     Mouth: Mucous membranes are moist.  Cardiovascular:     Rate and Rhythm: Regular rhythm.     Heart sounds: No murmur heard. Pulmonary:     Effort: No respiratory distress.     Breath sounds: Normal breath sounds.  Musculoskeletal:     Left shoulder: Tenderness present.     Comments: Mild pain noted with flexion extension of the left shoulder No pain noted with abduction and adduction Negative empty can test Mild pain noted with Neer test   Neurological:     Mental Status: He is alert.     BP 130/83   Pulse 82   Wt 175 lb 1.3 oz (79.4 kg)   SpO2 95%   BMI 24.42 kg/m  Wt Readings from Last 3 Encounters:  07/11/22 175 lb 1.3 oz (79.4 kg)  06/26/22 176 lb 3.2 oz (79.9 kg)  06/21/22 174 lb (78.9 kg)    Lab Results  Component Value Date   TSH 1.880 04/04/2022   Lab Results  Component Value Date   WBC 8.6 06/20/2022   HGB 16.5 06/20/2022   HCT 50.1 06/20/2022   MCV 89.6 06/20/2022   PLT 291 06/20/2022   Lab Results   Component Value Date   NA 137 06/20/2022   K 4.8 06/20/2022   CO2 28 06/20/2022   GLUCOSE 90 06/20/2022   BUN 20 06/20/2022   CREATININE 0.96 06/20/2022   BILITOT 0.2 (L) 06/20/2022   ALKPHOS 101 06/20/2022   AST 26 06/20/2022   ALT 24 06/20/2022   PROT 7.7 06/20/2022   ALBUMIN 4.5 06/20/2022   CALCIUM 9.7 06/20/2022   ANIONGAP 8 06/20/2022   EGFR 91 11/14/2021   Lab Results  Component Value Date   CHOL 161 04/04/2022   Lab Results  Component Value Date   HDL 44 04/04/2022   Lab Results  Component Value Date   LDLCALC 85 04/04/2022   Lab Results  Component Value Date   TRIG 185 (H) 04/04/2022   Lab Results  Component Value Date   CHOLHDL 3.7 04/04/2022   Lab Results  Component Value Date   HGBA1C 5.4 04/04/2022      Assessment & Plan:  Thumb pain, left Assessment & Plan: Images of the left thumb revealed arthritis in the joints of the left thumb Will treat today with meloxicam 7.5 mg daily Referral placed to orthopedic surgery   Chronic thumb pain, left -     Meloxicam; Take 1 tablet (7.5 mg total) by mouth daily.  Dispense: 30 tablet; Refill: 0 -     Ambulatory referral to Orthopedic Surgery -     DG Shoulder Left  Chronic left shoulder pain Assessment &  Plan: Onset of symptoms since 2023 Reports trauma to the left shoulder that precipitated the onset of his pain Pain is worse with activity and relieved with rest Pain is worse when lying on the affected side Increased pain in the left shoulder has affected his ability to work in his yard Pain is not radiating No numbness or tingling in the affected site reported No stiffness reported Range of motion is intact with tenderness noted We will get imaging study of the left shoulder Referral placed to [facility] for further evaluation Will treat today with meloxicam 7.5 mg daily Encouraged to take Pepcid for GI prophylaxis   Gastroesophageal reflux disease without esophagitis -     Famotidine;  Take 1 tablet (10 mg total) by mouth daily.  Dispense: 30 tablet; Refill: 0  IFG (impaired fasting glucose) -     Hemoglobin A1c  Vitamin D deficiency -     VITAMIN D 25 Hydroxy (Vit-D Deficiency, Fractures)  Other specified hypothyroidism -     TSH + free T4  Other hyperlipidemia -     Lipid panel -     CMP14+EGFR   Note: This chart has been completed using Engineer, civil (consulting) software, and while attempts have been made to ensure accuracy, certain words and phrases may not be transcribed as intended.    Follow-up: Return in about 3 months (around 10/11/2022).   [name], FNP

## 2022-07-12 ENCOUNTER — Other Ambulatory Visit: Payer: Self-pay | Admitting: Family Medicine

## 2022-07-12 DIAGNOSIS — E559 Vitamin D deficiency, unspecified: Secondary | ICD-10-CM | POA: Diagnosis not present

## 2022-07-12 DIAGNOSIS — E038 Other specified hypothyroidism: Secondary | ICD-10-CM | POA: Diagnosis not present

## 2022-07-12 DIAGNOSIS — G8929 Other chronic pain: Secondary | ICD-10-CM

## 2022-07-12 DIAGNOSIS — E7849 Other hyperlipidemia: Secondary | ICD-10-CM | POA: Diagnosis not present

## 2022-07-12 DIAGNOSIS — R7301 Impaired fasting glucose: Secondary | ICD-10-CM | POA: Diagnosis not present

## 2022-07-13 LAB — CMP14+EGFR
ALT: 32 IU/L (ref 0–44)
AST: 23 IU/L (ref 0–40)
Albumin/Globulin Ratio: 2.1 (ref 1.2–2.2)
Albumin: 4.4 g/dL (ref 3.8–4.8)
Alkaline Phosphatase: 138 IU/L — ABNORMAL HIGH (ref 44–121)
BUN/Creatinine Ratio: 17 (ref 10–24)
BUN: 16 mg/dL (ref 8–27)
Bilirubin Total: 0.4 mg/dL (ref 0.0–1.2)
CO2: 24 mmol/L (ref 20–29)
Calcium: 9.6 mg/dL (ref 8.6–10.2)
Chloride: 103 mmol/L (ref 96–106)
Creatinine, Ser: 0.96 mg/dL (ref 0.76–1.27)
Globulin, Total: 2.1 g/dL (ref 1.5–4.5)
Glucose: 89 mg/dL (ref 70–99)
Potassium: 5.3 mmol/L — ABNORMAL HIGH (ref 3.5–5.2)
Sodium: 140 mmol/L (ref 134–144)
Total Protein: 6.5 g/dL (ref 6.0–8.5)
eGFR: 83 mL/min/{1.73_m2} (ref >59)

## 2022-07-13 LAB — LIPID PANEL
Chol/HDL Ratio: 3.8 ratio (ref 0.0–5.0)
Cholesterol, Total: 171 mg/dL (ref 100–199)
HDL: 45 mg/dL (ref >39)
LDL Chol Calc (NIH): 94 mg/dL (ref 0–99)
Triglycerides: 187 mg/dL — ABNORMAL HIGH (ref 0–149)
VLDL Cholesterol Cal: 32 mg/dL (ref 5–40)

## 2022-07-13 LAB — TSH+FREE T4
Free T4: 1.13 ng/dL (ref 0.82–1.77)
TSH: 1.99 u[IU]/mL (ref 0.450–4.500)

## 2022-07-13 LAB — VITAMIN D 25 HYDROXY (VIT D DEFICIENCY, FRACTURES): Vit D, 25-Hydroxy: 35.9 ng/mL (ref 30.0–100.0)

## 2022-07-13 LAB — HEMOGLOBIN A1C
Est. average glucose Bld gHb Est-mCnc: 111 mg/dL
Hgb A1c MFr Bld: 5.5 % (ref 4.8–5.6)

## 2022-07-13 NOTE — Progress Notes (Signed)
Please inform the patient that his triglyceride cholesterol is elevated.  I recommend a diet low in sugar, saturated fat, and simple carbohydrates.  Please advise the patient to take over-the-counter fish oil 2000 mg twice daily to lower his triglyceride levels.  His potassium level is slightly elevated.  I recommend decreasing his dietary intake of potassium rich foods.

## 2022-07-16 NOTE — Progress Notes (Signed)
Please inform the patient that his x-ray showed no fracture or dislocation. He does have arthritis in the acromioclavicular joint of the left shoulder.I recommend conservative treatments

## 2022-07-20 DIAGNOSIS — H5203 Hypermetropia, bilateral: Secondary | ICD-10-CM | POA: Diagnosis not present

## 2022-07-20 DIAGNOSIS — H52223 Regular astigmatism, bilateral: Secondary | ICD-10-CM | POA: Diagnosis not present

## 2022-07-20 DIAGNOSIS — H524 Presbyopia: Secondary | ICD-10-CM | POA: Diagnosis not present

## 2022-09-18 ENCOUNTER — Other Ambulatory Visit: Payer: Self-pay | Admitting: Family Medicine

## 2022-09-18 DIAGNOSIS — G8929 Other chronic pain: Secondary | ICD-10-CM

## 2022-09-19 MED ORDER — MELOXICAM 7.5 MG PO TABS
7.5000 mg | ORAL_TABLET | Freq: Every day | ORAL | 0 refills | Status: DC
Start: 2022-09-19 — End: 2023-07-31

## 2022-09-22 ENCOUNTER — Ambulatory Visit (HOSPITAL_BASED_OUTPATIENT_CLINIC_OR_DEPARTMENT_OTHER): Payer: Medicare HMO | Admitting: Pulmonary Disease

## 2022-09-22 ENCOUNTER — Encounter (HOSPITAL_BASED_OUTPATIENT_CLINIC_OR_DEPARTMENT_OTHER): Payer: Self-pay | Admitting: Pulmonary Disease

## 2022-09-22 VITALS — BP 108/60 | HR 77 | Resp 23 | Ht 71.0 in | Wt 177.6 lb

## 2022-09-22 DIAGNOSIS — J4540 Moderate persistent asthma, uncomplicated: Secondary | ICD-10-CM

## 2022-09-22 DIAGNOSIS — D869 Sarcoidosis, unspecified: Secondary | ICD-10-CM | POA: Diagnosis not present

## 2022-09-22 NOTE — Progress Notes (Signed)
[facility]  Chief Complaint  Patient presents with   Follow-up    6 month follow pt has no know concerns     Past Surgical History:  He  has a past surgical history that includes Tonsillectomy; Knee surgery; Colonoscopy (02/08/2011); Kyphoplasty; Knee arthroscopy with medial menisectomy (Right, 04/05/2016); Inguinal hernia repair (Right, 01/29/2017); Axillary lymph node biopsy (Right, 1980); Total knee arthroplasty (Right, 02/08/2021); Colonoscopy with propofol (N/A, 09/28/2021); biopsy (09/28/2021); polypectomy (09/28/2021); Laparoscopic partial colectomy (N/A, 11/02/2021); and Colon surgery.  Past Medical History:  Anxiety, OA, Depression, Nephrolithiasis, HLD, Insomnia  Constitutional:  BP 108/60   Pulse 77   Resp (!) 23   Ht 5\' 11"  (1.803 m)   Wt 177 lb 9.6 oz (80.6 kg)   SpO2 96%   BMI 24.77 kg/m   Brief Summary:  Albert Gonzalez is a 72 y.o. male former smoker with sarcoidosis with pulmonary fibrosis, and asthma.  He had PPD positive in 1979 treated with INH for 1 year, and Rt axillary LN biopsy in 1980 that showed sarcoidosis.      Subjective:   Breathing has been okay except when it is hot and humid outside.  Not having cough, wheeze, or sputum.  He is taking care of his wife who had RA and recurrent bladder infections.  He has to help with in/out catheterization for her and this disrupts his sleep sometimes.  Physical Exam:   Appearance - well kempt   ENMT - no sinus tenderness, no oral exudate, no LAN, Mallampati 2 airway, no stridor  Respiratory - equal breath sounds bilaterally, no wheezing or rales  CV - s1s2 regular rate and rhythm, no murmurs  Ext - no clubbing, no edema  Skin - no rashes  Psych - normal mood and affect    Pulmonary testing:  PFT 07/04/21 >> FEV1 3.18 (96%), FEV1% 90, TLC 4.62 (63%), DLCO 59%  Chest Imaging:  HRCT chest 08/15/21 >> moderate fibrosis with apical basal gradient, irregular  peripheral interstitial opacity, septal thickening and nodularity, subpleural BTX and small areas of honeycombing at bases (sarcoidosis versus sarcoidosis +UIP) CT chest 12/15/21 >> no change  Sleep Tests:  PSG 08/10/21 >> AHI 1.8, SpO2 low 90%  Social History:  He  reports that he quit smoking about 32 years ago. His smoking use included pipe and cigarettes. He started smoking about 52 years ago. He has a 10 pack-year smoking history. He has never used smokeless tobacco. He reports that he does not currently use alcohol. He reports that he does not use drugs.  Family History:  His family history includes Suicidality in his father.     Assessment/Plan:   Allergic asthma. - continue advair 250 one puff bid - prn albuterol  Sarcoidosis. - has fibrotic changes which could have UIP overlap - most recent CT chest stable - monitor clinically and repeat testing if he has symptom progression  Colon cancer. - followed by Dr. Ellin Saba with oncology  Time Spent Involved in Patient Care on Day of Examination:  26 minutes  Follow up:   Patient Instructions  Follow up in 1 year  Medication List:   Allergies as of 09/22/2022       Reactions   Sulfa Antibiotics Rash   Childhood         Medication List        Accurate as of September 22, 2022 11:47 AM. If you have any questions, ask your nurse or doctor.  STOP taking these medications    ALPRAZolam 0.25 MG tablet Commonly known as: XANAX Stopped by: Coralyn Helling   tiZANidine 4 MG tablet Commonly known as: ZANAFLEX Stopped by: Coralyn Helling       TAKE these medications    albuterol 108 (90 Base) MCG/ACT inhaler Commonly known as: VENTOLIN HFA INHALE ONE PUFF INTO THE LUNGS FOUR TIMES DAILY AS NEEDED   aspirin 325 MG tablet Take 325 mg by mouth daily.   atorvastatin 40 MG tablet Commonly known as: LIPITOR TAKE ONE TABLET BY MOUTH EVERY NIGHT AT BEDTIME   busPIRone 10 MG tablet Commonly known as:  BUSPAR TAKE ONE (1) TABLET BY MOUTH 3 TIMES DAILY What changed: Another medication with the same name was removed. Continue taking this medication, and follow the directions you see here. Changed by: Coralyn Helling   CALCIUM + D PO Take 2 tablets by mouth daily.   diclofenac 75 MG EC tablet Commonly known as: VOLTAREN TAKE ONE TABLET BY MOUTH TWICE DAILY AS NEEDED   DULoxetine 60 MG capsule Commonly known as: CYMBALTA TAKE 1 (ONE) CAPSULE BY MOUTH TWO TIMES DAILY   famotidine 10 MG tablet Commonly known as: PEPCID Take 1 tablet (10 mg total) by mouth daily.   ferrous sulfate 325 (65 FE) MG tablet Take 325 mg by mouth daily with breakfast.   fluticasone-salmeterol 250-50 MCG/ACT Aepb Commonly known as: ADVAIR Inhale 1 puff into the lungs in the morning and at bedtime.   GLUCOSAMINE CHOND DOUBLE STR PO Take 2 tablets by mouth daily.   meloxicam 7.5 MG tablet Commonly known as: MOBIC Take 1 tablet (7.5 mg total) by mouth daily.   multivitamin tablet Take 1 tablet by mouth daily. Men's 50+   polyethylene glycol 17 g packet Commonly known as: MIRALAX / GLYCOLAX Take 17 g by mouth daily as needed for mild constipation. What changed: when to take this        Signature:  Coralyn Helling, MD [facility] Pager - [phone] 09/22/2022, 11:47 AM

## 2022-09-22 NOTE — Patient Instructions (Signed)
Follow up in 1 year.

## 2022-09-26 ENCOUNTER — Inpatient Hospital Stay: Payer: Medicare HMO | Attending: Hematology

## 2022-09-26 DIAGNOSIS — Z85038 Personal history of other malignant neoplasm of large intestine: Secondary | ICD-10-CM | POA: Diagnosis not present

## 2022-09-26 DIAGNOSIS — C184 Malignant neoplasm of transverse colon: Secondary | ICD-10-CM

## 2022-09-26 DIAGNOSIS — D649 Anemia, unspecified: Secondary | ICD-10-CM | POA: Insufficient documentation

## 2022-09-26 LAB — CBC WITH DIFFERENTIAL/PLATELET
Abs Immature Granulocytes: 0.04 10*3/uL (ref 0.00–0.07)
Basophils Absolute: 0.1 10*3/uL (ref 0.0–0.1)
Basophils Relative: 1 %
Eosinophils Absolute: 0.5 10*3/uL (ref 0.0–0.5)
Eosinophils Relative: 5 %
HCT: 44.8 % (ref 39.0–52.0)
Hemoglobin: 14.8 g/dL (ref 13.0–17.0)
Immature Granulocytes: 0 %
Lymphocytes Relative: 16 %
Lymphs Abs: 1.4 10*3/uL (ref 0.7–4.0)
MCH: 29.2 pg (ref 26.0–34.0)
MCHC: 33 g/dL (ref 30.0–36.0)
MCV: 88.5 fL (ref 80.0–100.0)
Monocytes Absolute: 0.8 10*3/uL (ref 0.1–1.0)
Monocytes Relative: 8 %
Neutro Abs: 6.2 10*3/uL (ref 1.7–7.7)
Neutrophils Relative %: 70 %
Platelets: 253 10*3/uL (ref 150–400)
RBC: 5.06 MIL/uL (ref 4.22–5.81)
RDW: 13.5 % (ref 11.5–15.5)
WBC: 8.9 10*3/uL (ref 4.0–10.5)
nRBC: 0 % (ref 0.0–0.2)

## 2022-09-26 LAB — IRON AND TIBC
Iron: 67 ug/dL (ref 45–182)
Saturation Ratios: 18 % (ref 17.9–39.5)
TIBC: 373 ug/dL (ref 250–450)
UIBC: 306 ug/dL

## 2022-09-26 LAB — COMPREHENSIVE METABOLIC PANEL
ALT: 24 U/L (ref 0–44)
AST: 23 U/L (ref 15–41)
Albumin: 4 g/dL (ref 3.5–5.0)
Alkaline Phosphatase: 94 U/L (ref 38–126)
Anion gap: 7 (ref 5–15)
BUN: 17 mg/dL (ref 8–23)
CO2: 27 mmol/L (ref 22–32)
Calcium: 9.3 mg/dL (ref 8.9–10.3)
Chloride: 103 mmol/L (ref 98–111)
Creatinine, Ser: 0.91 mg/dL (ref 0.61–1.24)
GFR, Estimated: >60 mL/min (ref >60)
Glucose, Bld: 69 mg/dL — ABNORMAL LOW (ref 70–99)
Potassium: 3.9 mmol/L (ref 3.5–5.1)
Sodium: 137 mmol/L (ref 135–145)
Total Bilirubin: 0.7 mg/dL (ref 0.3–1.2)
Total Protein: 6.7 g/dL (ref 6.5–8.1)

## 2022-09-26 LAB — FERRITIN: Ferritin: 38 ng/mL (ref 24–336)

## 2022-10-02 NOTE — Progress Notes (Signed)
[name] [address], [place] [address]    Clinic Day:  10/03/2022  Referring physician: [name], FNP  Patient Care Team: Albert Laroche, FNP as PCP - General (Family Medicine) Albert Gonzalez Albert Friends, MD (Gastroenterology) Albert Massed, MD as Medical Oncologist (Medical Oncology) Albert Sarah, RN as Oncology Nurse Navigator (Medical Oncology)   ASSESSMENT & PLAN:   Assessment: 1.  Stage II (PT3 N0) ascending colon adenocarcinoma: - Colonoscopy (09/28/2021): Exophytic apple core appearing mass in the proximal colon.  Location in relation to the hepatic flexure or cecum could not be ascertained with certainty.  Nonbleeding internal hemorrhoids. - CEA on 09/28/2021-2.8 - CT AP (10/12/2021): Apple core lesion in the hepatic flexure with no evidence of metastatic disease in the abdomen or pelvis. - Laparoscopic right hemicolectomy by Dr. Henreitta Gonzalez on 11/02/2021 - Pathology: Moderately differentiated adenocarcinoma extending into pericolonic connective tissue, margins negative, 0/24 lymph nodes involved, negative LVI and PNI, MMR preserved. - Personal history of sarcoidosis from a left axillary lymph node biopsy prior to 2000   2.  Social/family history: - He lives at home with his wife.  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  He smoked a pipe briefly for less than a year but was never cigarette smoker.  Maternal uncle had esophageal cancer.    Plan: 1.  Stage II (PT3N0) ascending colon adenocarcinoma: - He does not report any change in bowel habits or bleeding per rectum. - CEA is 4.2.  LFTs are normal.  CBC was normal. - CTAP (06/20/2022): Stable postsurgical changes with no evidence of recurrence.  Infrarenal abdominal aortic aneurysm measures 4.6 x 4.4 cm. - RTC 3 months with repeat CBC, CEA and CMP.  We will repeat CT scan in 6 months.   2.  Normocytic anemia: - He was taking iron tablet daily and stopped it  3 weeks ago. - We reviewed ferritin which has increased to 38 from 22 previously. - He was told to restart iron 1 tablet daily which he tolerated well.    No orders of the defined types were placed in this encounter.     Albert Gonzalez,acting as a Neurosurgeon for Albert Massed, MD.,have documented all relevant documentation on the behalf of Albert Massed, MD,as directed by  Albert Massed, MD while in the presence of Albert Massed, MD.  ***   Albert Gonzalez   8/5/20248:56 PM  CHIEF COMPLAINT:   Diagnosis: stage II right colon cancer    Cancer Staging  No matching staging information was found for the patient.    Prior Therapy: Right hemicolectomy on 11/02/2021   Current Therapy:  Surveillance    HISTORY OF PRESENT ILLNESS:   Oncology History   No history exists.     INTERVAL HISTORY:   Albert Gonzalez is a 72 y.o. male presenting to clinic today for follow up of stage II right colon cancer. He was last seen by me on 06/26/22.  Today, he states that he is doing well overall. His appetite level is at ***%. His energy level is at ***%.  PAST MEDICAL HISTORY:   Past Medical History: Past Medical History:  Diagnosis Date   Anxiety    Arthritis    Cancer (HCC)    COPD (chronic obstructive pulmonary disease) (HCC)    Depression    History of kidney stones    Hyperlipidemia    Inguinal hernia right    Insomnia    Myalgia  PPD positive, treated 1979   1 year of INH   Raynaud phenomenon    Restless leg syndrome    Sarcoidosis 1980    Surgical History: Past Surgical History:  Procedure Laterality Date   AXILLARY LYMPH NODE BIOPSY Right 1980   BIOPSY  09/28/2021   Procedure: BIOPSY;  Surgeon: Albert Ade, MD;  [facility];  Service: Endoscopy;;   COLON SURGERY     COLONOSCOPY  02/08/2011   Procedure: COLONOSCOPY;  Surgeon: Albert Ade, MD;  [facility];  Service: Endoscopy;  Laterality: N/A;  1:30 PM    COLONOSCOPY WITH PROPOFOL N/A 09/28/2021   Procedure: COLONOSCOPY WITH PROPOFOL;  Surgeon: Albert Ade, MD;  [facility];  Service: Endoscopy;  Laterality: N/A;  2:15pm   INGUINAL HERNIA REPAIR Right 01/29/2017   Procedure: HERNIA REPAIR INGUINAL ADULT WITH MESH;  Surgeon: Albert Roers, MD;  Location: [facility];  Service: General;  Laterality: Right;   KNEE ARTHROSCOPY WITH MEDIAL MENISECTOMY Right 04/05/2016   Procedure: RIGHT KNEE ARTHROSCOPY WITH PARTIAL  LATERAL MENISECTOMY, DEBRIDEMENT MEDIAL MENISCUS, ACL DEBRIDEMENT, MICRO FRACTURE OF FEMUR;  Surgeon: Albert Hearing, MD;  Location: [facility];  Service: Orthopedics;  Laterality: Right;   KNEE SURGERY     left   KYPHOPLASTY     LAPAROSCOPIC PARTIAL COLECTOMY N/A 11/02/2021   Procedure: LAPAROSCOPIC EXTENDED RIGHT HEMICOLECTOMY;  Surgeon: Albert Roers, MD;  Location: [facility];  Service: General;  Laterality: N/A;   POLYPECTOMY  09/28/2021   Procedure: POLYPECTOMY;  Surgeon: Albert Ade, MD;  [facility];  Service: Endoscopy;;   TONSILLECTOMY     TOTAL KNEE ARTHROPLASTY Right 02/08/2021   Procedure: TOTAL KNEE ARTHROPLASTY;  Surgeon: Albert Hearing, MD;  Location: [facility];  Service: Orthopedics;  Laterality: Right;    Social History: Social History   Socioeconomic History   Marital status: Married    Spouse name: Not on file   Number of children: 1   Years of education: Not on file   Highest education level: Not on file  Occupational History   Occupation: respiratory therapist    Employer: [employer]  Tobacco Use   Smoking status: Former    Current packs/day: 0.00    Average packs/day: 0.5 packs/day for 20.0 years (10.0 ttl pk-yrs)    Types: Pipe, Cigarettes    Start date: 04/03/1970    Quit date: 04/03/1990    Years since quitting: 32.5   Smokeless tobacco: Never   Tobacco comments:    quit many years ago  Vaping Use   Vaping status: Never Used  Substance and  Sexual Activity   Alcohol use: Not Currently    Comment: occassional   Drug use: No   Sexual activity: Yes  Other Topics Concern   Not on file  Social History Narrative   Not on file   Social Determinants of Health   Financial Resource Strain: Not on file  Food Insecurity: No Food Insecurity (11/02/2021)   Hunger Vital Sign    Worried About Running Out of Food in the Last Year: Never true    Ran Out of Food in the Last Year: Never true  Transportation Needs: No Transportation Needs (11/02/2021)   PRAPARE - Administrator, Civil Service (Medical): No    Lack of Transportation (Non-Medical): No  Physical Activity: Not on file  Stress: Not on file  Social Connections: Not on file  Intimate Partner  Violence: Not At Risk (11/02/2021)   Humiliation, Afraid, Rape, and Kick questionnaire    Fear of Current or Ex-Partner: No    Emotionally Abused: No    Physically Abused: No    Sexually Abused: No    Family History: Family History  Problem Relation Age of Onset   Suicidality Father    Colon cancer Neg Hx    Liver disease Neg Hx    Inflammatory bowel disease Neg Hx    Anesthesia problems Neg Hx     Current Medications:  Current Outpatient Medications:    albuterol (VENTOLIN HFA) 108 (90 Base) MCG/ACT inhaler, INHALE ONE PUFF INTO THE LUNGS FOUR TIMES DAILY AS NEEDED, Disp: 8.5 g, Rfl: 1   aspirin 325 MG tablet, Take 325 mg by mouth daily., Disp: , Rfl:    atorvastatin (LIPITOR) 40 MG tablet, TAKE ONE TABLET BY MOUTH EVERY NIGHT AT BEDTIME, Disp: 90 tablet, Rfl: 0   busPIRone (BUSPAR) 10 MG tablet, TAKE ONE (1) TABLET BY MOUTH 3 TIMES DAILY, Disp: 90 tablet, Rfl: 1   Calcium Citrate-Vitamin D (CALCIUM + D PO), Take 2 tablets by mouth daily., Disp: , Rfl:    diclofenac (VOLTAREN) 75 MG EC tablet, TAKE ONE TABLET BY MOUTH TWICE DAILY AS NEEDED, Disp: 60 tablet, Rfl: 0   DULoxetine (CYMBALTA) 60 MG capsule, TAKE 1 (ONE) CAPSULE BY MOUTH TWO TIMES DAILY, Disp: 180 capsule,  Rfl: 1   famotidine (PEPCID) 10 MG tablet, Take 1 tablet (10 mg total) by mouth daily., Disp: 30 tablet, Rfl: 0   ferrous sulfate 325 (65 FE) MG tablet, Take 325 mg by mouth daily with breakfast., Disp: , Rfl:    fluticasone-salmeterol (ADVAIR) 250-50 MCG/ACT AEPB, Inhale 1 puff into the lungs in the morning and at bedtime., Disp: 60 each, Rfl: 1   meloxicam (MOBIC) 7.5 MG tablet, Take 1 tablet (7.5 mg total) by mouth daily., Disp: 30 tablet, Rfl: 0   Misc Natural Products (GLUCOSAMINE CHOND DOUBLE STR PO), Take 2 tablets by mouth daily., Disp: , Rfl:    Multiple Vitamin (MULTIVITAMIN) tablet, Take 1 tablet by mouth daily. Men's 50+, Disp: , Rfl:    polyethylene glycol (MIRALAX / GLYCOLAX) 17 g packet, Take 17 g by mouth daily as needed for mild constipation. (Patient taking differently: Take 17 g by mouth daily.), Disp: 14 each, Rfl: 0   Allergies: Allergies  Allergen Reactions   Sulfa Antibiotics Rash    Childhood     REVIEW OF SYSTEMS:   Review of Systems  Constitutional:  Negative for chills, fatigue and fever.  HENT:   Negative for lump/mass, mouth sores, nosebleeds, sore throat and trouble swallowing.   Eyes:  Negative for eye problems.  Respiratory:  Negative for cough and shortness of breath.   Cardiovascular:  Negative for chest pain, leg swelling and palpitations.  Gastrointestinal:  Negative for abdominal pain, constipation, diarrhea, nausea and vomiting.  Genitourinary:  Negative for bladder incontinence, difficulty urinating, dysuria, frequency, hematuria and nocturia.   Musculoskeletal:  Negative for arthralgias, back pain, flank pain, myalgias and neck pain.  Skin:  Negative for itching and rash.  Neurological:  Negative for dizziness, headaches and numbness.  Hematological:  Does not bruise/bleed easily.  Psychiatric/Behavioral:  Negative for depression, sleep disturbance and suicidal ideas. The patient is not nervous/anxious.   All other systems reviewed and are  negative.    VITALS:   There were no vitals taken for this visit.  Wt Readings from Last 3 Encounters:  09/22/22  177 lb 9.6 oz (80.6 kg)  07/11/22 175 lb 1.3 oz (79.4 kg)  06/26/22 176 lb 3.2 oz (79.9 kg)    There is no height or weight on file to calculate BMI.  Performance status (ECOG): 1 - Symptomatic but completely ambulatory  PHYSICAL EXAM:   Physical Exam Vitals and nursing note reviewed. Exam conducted with a chaperone present.  Constitutional:      Appearance: Normal appearance.  Cardiovascular:     Rate and Rhythm: Normal rate and regular rhythm.     Pulses: Normal pulses.     Heart sounds: Normal heart sounds.  Pulmonary:     Effort: Pulmonary effort is normal.     Breath sounds: Normal breath sounds.  Abdominal:     Palpations: Abdomen is soft. There is no hepatomegaly, splenomegaly or mass.     Tenderness: There is no abdominal tenderness.  Musculoskeletal:     Right lower leg: No edema.     Left lower leg: No edema.  Lymphadenopathy:     Cervical: No cervical adenopathy.     Right cervical: No superficial, deep or posterior cervical adenopathy.    Left cervical: No superficial, deep or posterior cervical adenopathy.     Upper Body:     Right upper body: No supraclavicular or axillary adenopathy.     Left upper body: No supraclavicular or axillary adenopathy.  Neurological:     General: No focal deficit present.     Mental Status: He is alert and oriented to person, place, and time.  Psychiatric:        Mood and Affect: Mood normal.        Behavior: Behavior normal.     LABS:      Latest Ref Rng & Units 09/26/2022    2:55 PM 06/20/2022   12:24 PM 03/16/2022    1:35 PM  CBC  WBC 4.0 - 10.5 K/uL 8.9  8.6  8.1   Hemoglobin 13.0 - 17.0 g/dL 69.6  29.5  28.4   Hematocrit 39.0 - 52.0 % 44.8  50.1  49.8   Platelets 150 - 400 K/uL 253  291  266       Latest Ref Rng & Units 09/26/2022    2:55 PM 07/12/2022    9:12 AM 06/20/2022   12:24 PM  CMP   Glucose 70 - 99 mg/dL 69  89  90   BUN 8 - 23 mg/dL 17  16  20    Creatinine 0.61 - 1.24 mg/dL 1.32  4.40  1.02   Sodium 135 - 145 mmol/L 137  140  137   Potassium 3.5 - 5.1 mmol/L 3.9  5.3  4.8   Chloride 98 - 111 mmol/L 103  103  101   CO2 22 - 32 mmol/L 27  24  28    Calcium 8.9 - 10.3 mg/dL 9.3  9.6  9.7   Total Protein 6.5 - 8.1 g/dL 6.7  6.5  7.7   Total Bilirubin 0.3 - 1.2 mg/dL 0.7  0.4  0.2   Alkaline Phos 38 - 126 U/L 94  138  101   AST 15 - 41 U/L 23  23  26    ALT 0 - 44 U/L 24  32  24      Lab Results  Component Value Date   CEA1 3.6 09/26/2022   /  CEA  Date Value Ref Range Status  09/26/2022 3.6 0.0 - 4.7 ng/mL Final    Comment:    (NOTE)  Nonsmokers          <3.9                             Smokers             <5.6 Roche [employer] Electrochemiluminescence Immunoassay (ECLIA) Values obtained with different assay methods or kits cannot be used interchangeably.  Results cannot be interpreted as absolute evidence of the presence or absence of malignant disease. Performed At: [facility] [id number] [place], [place] [id number] Jolene Schimke MD [id number]    No results found for: "PSA1" No results found for: "CAN199" No results found for: "CAN125"  No results found for: "TOTALPROTELP", "ALBUMINELP", "A1GS", "A2GS", "BETS", "BETA2SER", "GAMS", "MSPIKE", "SPEI" Lab Results  Component Value Date   TIBC 373 09/26/2022   TIBC 432 06/20/2022   TIBC 450 03/16/2022   FERRITIN 38 09/26/2022   FERRITIN 38 06/20/2022   FERRITIN 22 (L) 03/16/2022   IRONPCTSAT 18 09/26/2022   IRONPCTSAT 22 06/20/2022   IRONPCTSAT 24 03/16/2022   No results found for: "LDH"   STUDIES:   No results found.

## 2022-10-03 ENCOUNTER — Inpatient Hospital Stay: Payer: Medicare HMO | Attending: Hematology | Admitting: Hematology

## 2022-10-03 VITALS — BP 133/85 | HR 74 | Temp 97.5°F | Resp 18 | Wt 179.4 lb

## 2022-10-03 DIAGNOSIS — C184 Malignant neoplasm of transverse colon: Secondary | ICD-10-CM

## 2022-10-03 DIAGNOSIS — Z85038 Personal history of other malignant neoplasm of large intestine: Secondary | ICD-10-CM | POA: Diagnosis not present

## 2022-10-03 NOTE — Patient Instructions (Addendum)
[facility]  Discharge Instructions  You were seen and examined today by Dr. Ellin Saba.  Dr. Ellin Saba discussed your most recent lab work and CT scan which revealed that everything looks good and stable.   Dr. Ellin Saba wants you to start taking the Iron twice daily. Dr. Ellin Saba discussed possible IV Iron infusion if your iron levels do not improve.   Dr. Ellin Saba will repeat labs and CT scan of your abdomen and pelvis before your next appointment.  Follow-up as scheduled in 3 months.    Thank you for choosing [facility] to provide your oncology and hematology care.   To afford each patient quality time with our provider, please arrive at least 15 minutes before your scheduled appointment time. You may need to reschedule your appointment if you arrive late (10 or more minutes). Arriving late affects you and other patients whose appointments are after yours.  Also, if you miss three or more appointments without notifying the office, you may be dismissed from the clinic at the provider's discretion.    Again, thank you for choosing [facility].  Our hope is that these requests will decrease the amount of time that you wait before being seen by our physicians.   If you have a lab appointment with the Cancer Center - please note that after April 8th, all labs will be drawn in the cancer center.  You do not have to check in or register with the main entrance as you have in the past but will complete your check-in at the cancer center.            _____________________________________________________________  Should you have questions after your visit to [facility], please contact our office at [phone] and follow the prompts.  Our office hours are 8:00 a.m. to 4:30 p.m. Monday - Thursday and 8:00 a.m. to 2:30 p.m. Friday.  Please note that voicemails left after 4:00 p.m. may not be returned until the  following business day.  We are closed weekends and all major holidays.  You do have access to a nurse 24-7, just call the main number to the clinic [phone] and do not press any options, hold on the line and a nurse will answer the phone.    For prescription refill requests, have your pharmacy contact our office and allow 72 hours.    Masks are no longer required in the cancer centers. If you would like for your care team to wear a mask while they are taking care of you, please let them know. You may have one support person who is at least 73 years old accompany you for your appointments.

## 2022-10-09 ENCOUNTER — Encounter: Payer: Self-pay | Admitting: *Deleted

## 2022-10-11 ENCOUNTER — Telehealth: Payer: Self-pay | Admitting: *Deleted

## 2022-10-11 MED ORDER — FLUTICASONE-SALMETEROL 250-50 MCG/ACT IN AEPB: 1.0000 | INHALATION_SPRAY | Freq: Two times a day (BID) | RESPIRATORY_TRACT | 2 refills | Status: AC

## 2022-10-11 MED ORDER — ALBUTEROL SULFATE HFA 108 (90 BASE) MCG/ACT IN AERS: INHALATION_SPRAY | RESPIRATORY_TRACT | 1 refills | Status: DC

## 2022-10-11 NOTE — Telephone Encounter (Signed)
Patient would like a refill for Advair and ventolin.   Patient uses [facility]  [phone] patient would like a call to confirm order is sent

## 2022-10-11 NOTE — Telephone Encounter (Signed)
Rx sent to pharmacy   

## 2022-10-18 ENCOUNTER — Other Ambulatory Visit: Payer: Self-pay | Admitting: Family Medicine

## 2022-10-25 ENCOUNTER — Other Ambulatory Visit: Payer: Self-pay | Admitting: Family Medicine

## 2022-10-26 MED ORDER — DICLOFENAC SODIUM 75 MG PO TBEC: 75.0000 mg | DELAYED_RELEASE_TABLET | Freq: Two times a day (BID) | ORAL | 0 refills | Status: DC | PRN

## 2022-11-29 ENCOUNTER — Other Ambulatory Visit: Payer: Self-pay

## 2022-11-29 ENCOUNTER — Telehealth: Payer: Self-pay | Admitting: Family Medicine

## 2022-11-29 DIAGNOSIS — Z1211 Encounter for screening for malignant neoplasm of colon: Secondary | ICD-10-CM

## 2022-11-29 NOTE — Telephone Encounter (Signed)
Referral has been sent for colonoscopy.

## 2022-11-29 NOTE — Telephone Encounter (Signed)
Patient called due for a follow up year before on colonoscopy had cancer in past, but now clear. and needs a letter sent [employer] for for approval to have another colonoscopy done. Patient call back # [phone].

## 2022-11-30 ENCOUNTER — Encounter: Payer: Self-pay | Admitting: *Deleted

## 2022-12-15 ENCOUNTER — Other Ambulatory Visit: Payer: Self-pay | Admitting: Family Medicine

## 2022-12-18 IMAGING — DX DG CHEST 1V PORT
1 series · 1 of 1 positions shown · non-contrast
Comparison: Chest radiograph 11/15/2009.

CLINICAL DATA: 70-year-old male with possible sepsis. Fall. Former
smoker.

EXAM:
PORTABLE CHEST 1 VIEW

[chest ap]
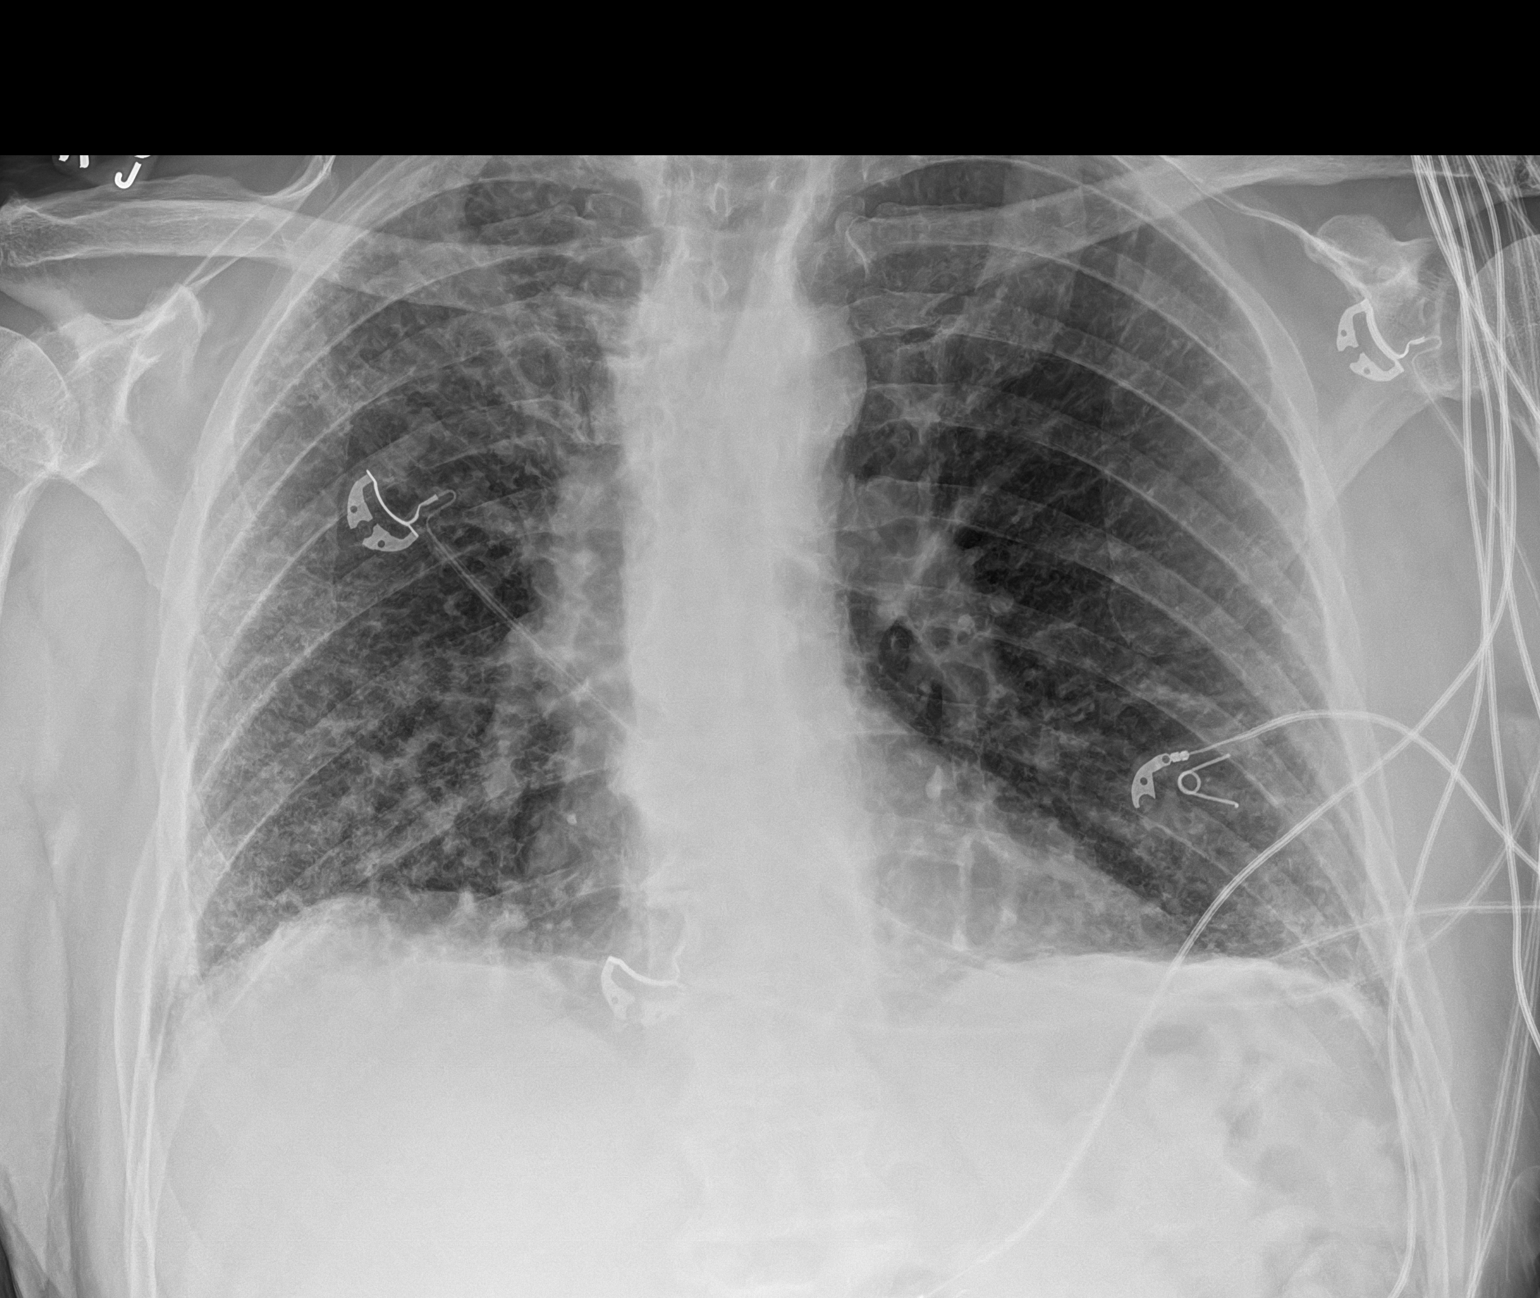

[1 of 1 positions shown; findings below may reference images not displayed]

FINDINGS: Portable AP upright view at 4994 hours. Stable lung volumes.
Mediastinal contours remain normal. Coarse bilateral pulmonary
interstitial opacity is new or increased since 9666, and
asymmetrically greater on the right. No definite pleural effusion.
No pneumothorax or consolidation.

Negative visible bowel gas in the upper abdomen. No acute osseous
abnormality identified.
IMPRESSION: Coarse bilateral pulmonary interstitial opacity is new since 9666,
greater in the right lung, and nonspecific. This could be chronic
interstitial lung disease, but acute viral/atypical respiratory
infection, or less likely asymmetric edema, are not excluded.

## 2022-12-18 MED ORDER — DICLOFENAC SODIUM 75 MG PO TBEC: 75.0000 mg | DELAYED_RELEASE_TABLET | Freq: Two times a day (BID) | ORAL | 0 refills | Status: DC | PRN

## 2023-01-03 ENCOUNTER — Inpatient Hospital Stay: Payer: Medicare HMO | Attending: Hematology

## 2023-01-03 ENCOUNTER — Ambulatory Visit (HOSPITAL_COMMUNITY)
Admission: RE | Admit: 2023-01-03 | Discharge: 2023-01-03 | Disposition: A | Discharge status: Home / Self Care | Payer: Medicare HMO | Source: Ambulatory Visit | Attending: Hematology | Admitting: Hematology

## 2023-01-03 DIAGNOSIS — D649 Anemia, unspecified: Secondary | ICD-10-CM | POA: Insufficient documentation

## 2023-01-03 DIAGNOSIS — C184 Malignant neoplasm of transverse colon: Secondary | ICD-10-CM | POA: Insufficient documentation

## 2023-01-03 DIAGNOSIS — C189 Malignant neoplasm of colon, unspecified: Secondary | ICD-10-CM | POA: Diagnosis not present

## 2023-01-03 DIAGNOSIS — Z08 Encounter for follow-up examination after completed treatment for malignant neoplasm: Secondary | ICD-10-CM | POA: Insufficient documentation

## 2023-01-03 DIAGNOSIS — I7143 Infrarenal abdominal aortic aneurysm, without rupture: Secondary | ICD-10-CM | POA: Diagnosis not present

## 2023-01-03 DIAGNOSIS — Z85038 Personal history of other malignant neoplasm of large intestine: Secondary | ICD-10-CM | POA: Diagnosis not present

## 2023-01-03 LAB — CBC WITH DIFFERENTIAL/PLATELET
Abs Immature Granulocytes: 0.03 10*3/uL (ref 0.00–0.07)
Basophils Absolute: 0.1 10*3/uL (ref 0.0–0.1)
Basophils Relative: 2 %
Eosinophils Absolute: 1.2 10*3/uL — ABNORMAL HIGH (ref 0.0–0.5)
Eosinophils Relative: 14 %
HCT: 48 % (ref 39.0–52.0)
Hemoglobin: 15.4 g/dL (ref 13.0–17.0)
Immature Granulocytes: 0 %
Lymphocytes Relative: 17 %
Lymphs Abs: 1.5 10*3/uL (ref 0.7–4.0)
MCH: 28.3 pg (ref 26.0–34.0)
MCHC: 32.1 g/dL (ref 30.0–36.0)
MCV: 88.1 fL (ref 80.0–100.0)
Monocytes Absolute: 0.8 10*3/uL (ref 0.1–1.0)
Monocytes Relative: 8 %
Neutro Abs: 5.4 10*3/uL (ref 1.7–7.7)
Neutrophils Relative %: 59 %
Platelets: 293 10*3/uL (ref 150–400)
RBC: 5.45 MIL/uL (ref 4.22–5.81)
RDW: 13.5 % (ref 11.5–15.5)
WBC: 9.1 10*3/uL (ref 4.0–10.5)
nRBC: 0 % (ref 0.0–0.2)

## 2023-01-03 LAB — COMPREHENSIVE METABOLIC PANEL
ALT: 24 U/L (ref 0–44)
AST: 26 U/L (ref 15–41)
Albumin: 4.2 g/dL (ref 3.5–5.0)
Alkaline Phosphatase: 103 U/L (ref 38–126)
Anion gap: 5 (ref 5–15)
BUN: 17 mg/dL (ref 8–23)
CO2: 27 mmol/L (ref 22–32)
Calcium: 9.3 mg/dL (ref 8.9–10.3)
Chloride: 103 mmol/L (ref 98–111)
Creatinine, Ser: 0.82 mg/dL (ref 0.61–1.24)
GFR, Estimated: >60 mL/min (ref >60)
Glucose, Bld: 82 mg/dL (ref 70–99)
Potassium: 4.4 mmol/L (ref 3.5–5.1)
Sodium: 135 mmol/L (ref 135–145)
Total Bilirubin: 0.3 mg/dL (ref <1.2)
Total Protein: 7.3 g/dL (ref 6.5–8.1)

## 2023-01-03 LAB — FOLATE: Folate: >40 ng/mL (ref >5.9)

## 2023-01-03 LAB — VITAMIN B12: Vitamin B-12: 741 pg/mL (ref 180–914)

## 2023-01-03 LAB — IRON AND TIBC
Iron: 67 ug/dL (ref 45–182)
Saturation Ratios: 17 % — ABNORMAL LOW (ref 17.9–39.5)
TIBC: 391 ug/dL (ref 250–450)
UIBC: 324 ug/dL

## 2023-01-03 LAB — FERRITIN: Ferritin: 47 ng/mL (ref 24–336)

## 2023-01-03 MED ORDER — IOHEXOL 300 MG/ML  SOLN
100.0000 mL | Freq: Once | INTRAMUSCULAR | Status: AC | PRN
  Administered 2023-01-03: 100 mL via INTRAVENOUS

## 2023-01-04 LAB — TESTOSTERONE: Testosterone: 529 ng/dL (ref 264–916)

## 2023-01-04 LAB — CEA: CEA: 3.7 ng/mL (ref 0.0–4.7)

## 2023-01-10 ENCOUNTER — Encounter: Payer: Self-pay | Admitting: Hematology

## 2023-01-10 ENCOUNTER — Inpatient Hospital Stay: Payer: Medicare HMO | Admitting: Hematology

## 2023-01-10 VITALS — BP 145/95 | HR 102 | Temp 98.2°F | Resp 18 | Wt 182.9 lb

## 2023-01-10 DIAGNOSIS — D649 Anemia, unspecified: Secondary | ICD-10-CM | POA: Diagnosis not present

## 2023-01-10 DIAGNOSIS — Z85038 Personal history of other malignant neoplasm of large intestine: Secondary | ICD-10-CM | POA: Diagnosis not present

## 2023-01-10 DIAGNOSIS — D508 Other iron deficiency anemias: Secondary | ICD-10-CM | POA: Diagnosis not present

## 2023-01-10 DIAGNOSIS — C184 Malignant neoplasm of transverse colon: Secondary | ICD-10-CM

## 2023-01-10 DIAGNOSIS — Z08 Encounter for follow-up examination after completed treatment for malignant neoplasm: Secondary | ICD-10-CM | POA: Diagnosis not present

## 2023-01-10 NOTE — Progress Notes (Signed)
[facility] [address], [place] [address]    Clinic Day:  01/10/23   Referring physician: [name], FNP  Patient Care Team: [name], FNP as PCP - General (Family Medicine) [name], MD (Gastroenterology) [name], MD as Medical Oncologist (Medical Oncology) [name], RN as Oncology Nurse Navigator (Medical Oncology)   ASSESSMENT & PLAN:   Assessment: 1.  Stage II (PT3 N0) ascending colon adenocarcinoma: - Colonoscopy (09/28/2021): Exophytic apple core appearing mass in the proximal colon.  Location in relation to the hepatic flexure or cecum could not be ascertained with certainty.  Nonbleeding internal hemorrhoids. - CEA on 09/28/2021-2.8 - CT [facility] (10/12/2021): Apple core lesion in the hepatic flexure with no evidence of metastatic disease in the abdomen or pelvis. - Laparoscopic right hemicolectomy by Dr. Henreitta Leber on 11/02/2021 - Pathology: Moderately differentiated adenocarcinoma extending into pericolonic connective tissue, margins negative, 0/24 lymph nodes involved, negative LVI and PNI, MMR preserved. - Personal history of sarcoidosis from a left axillary lymph node biopsy prior to 2000   2.  Social/family history: - He lives at home with his wife.  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  He smoked a pipe briefly for less than a year but was never cigarette smoker.  Maternal uncle had esophageal cancer.    Plan: 1.  Stage II (PT3N0) ascending colon adenocarcinoma: - He denies change in bowel habits or bleeding per rectum. - He could not do colonoscopy as he is taking care of his wife who is sick. - Reviewed labs: Normal LFTs.  CBC grossly normal.  CEA was 3.7. - CTAP (01/03/2023): No evidence of recurrence or metastatic disease.  Slight increase in size of 4.8 cm infrarenal abdominal aortic aneurysm. - We will refer to vascular surgery. - He will schedule colonoscopy as soon  as he is able.  RTC 3 months for follow-up with repeat labs and CEA.   2.  Normocytic anemia: - He was taking iron tablet daily and quit taking it 2 months ago as he felt better. - Ferritin is 47, improved from 38 previously.  Hemoglobin is 15.4. - Will repeat ferritin and iron panel at next visit.    Orders Placed This Encounter  Procedures   CBC with Differential    Standing Status:   Future    Standing Expiration Date:   01/10/2024   Comprehensive metabolic panel    Standing Status:   Future    Standing Expiration Date:   01/10/2024   CEA    Standing Status:   Future    Standing Expiration Date:   01/10/2024   Iron and TIBC ([facility] ONLY)    Standing Status:   Future    Standing Expiration Date:   01/10/2024   Ferritin    Standing Status:   Future    Standing Expiration Date:   01/10/2024      Mikeal Hawthorne R Teague,acting as a scribe for [name], MD.,have documented all relevant documentation on the behalf of [name], MD,as directed by  [name], MD while in the presence of [name], MD.  I, [name] MD, have reviewed the above documentation for accuracy and completeness, and I agree with the above.     [name], MD   11/13/20244:43 PM  CHIEF COMPLAINT:   Diagnosis: stage II right colon cancer    Cancer Staging  No matching staging information was found for the patient.  Prior Therapy: Right hemicolectomy on 11/02/2021   Current Therapy:  Surveillance    HISTORY OF PRESENT ILLNESS:   Oncology History   No history exists.     INTERVAL HISTORY:   Albert Gonzalez is a 72 y.o. male presenting to clinic today for follow up of stage II right colon cancer. He was last seen by me on 10/03/22.  Since his last visit, he underwent CT A/P on 01/03/23 that found: no evidence of recurrent or metastatic carcinoma within the abdomen or pelvis; slight increase in size of 4.8 cm infrarenal abdominal  aortic aneurysm; and chronic bibasilar pulmonary interstitial fibrosis.   Today, he states that he is doing well overall. His appetite level is at 100%. His energy level is at 100%.  PAST MEDICAL HISTORY:   Past Medical History: Past Medical History:  Diagnosis Date   Anxiety    Arthritis    Cancer (HCC)    COPD (chronic obstructive pulmonary disease) (HCC)    Depression    History of kidney stones    Hyperlipidemia    Inguinal hernia right    Insomnia    Myalgia    PPD positive, treated 1979   1 year of INH   Raynaud phenomenon    Restless leg syndrome    Sarcoidosis 1980    Surgical History: Past Surgical History:  Procedure Laterality Date   AXILLARY LYMPH NODE BIOPSY Right 1980   BIOPSY  09/28/2021   Procedure: BIOPSY;  Surgeon: Corbin Ade, MD;  [facility];  Service: Endoscopy;;   COLON SURGERY     COLONOSCOPY  02/08/2011   Procedure: COLONOSCOPY;  Surgeon: Corbin Ade, MD;  [facility];  Service: Endoscopy;  Laterality: N/A;  1:30 PM   COLONOSCOPY WITH PROPOFOL N/A 09/28/2021   Procedure: COLONOSCOPY WITH PROPOFOL;  Surgeon: Corbin Ade, MD;  [facility];  Service: Endoscopy;  Laterality: N/A;  2:15pm   INGUINAL HERNIA REPAIR Right 01/29/2017   Procedure: HERNIA REPAIR INGUINAL ADULT WITH MESH;  Surgeon: Lucretia Roers, MD;  Location: [facility];  Service: General;  Laterality: Right;   KNEE ARTHROSCOPY WITH MEDIAL MENISECTOMY Right 04/05/2016   Procedure: RIGHT KNEE ARTHROSCOPY WITH PARTIAL  LATERAL MENISECTOMY, DEBRIDEMENT MEDIAL MENISCUS, ACL DEBRIDEMENT, MICRO FRACTURE OF FEMUR;  Surgeon: Vickki Hearing, MD;  Location: [facility];  Service: Orthopedics;  Laterality: Right;   KNEE SURGERY     left   KYPHOPLASTY     LAPAROSCOPIC PARTIAL COLECTOMY N/A 11/02/2021   Procedure: LAPAROSCOPIC EXTENDED RIGHT HEMICOLECTOMY;  Surgeon: Lucretia Roers, MD;  Location: [facility];  Service: General;  Laterality: N/A;    POLYPECTOMY  09/28/2021   Procedure: POLYPECTOMY;  Surgeon: Corbin Ade, MD;  [facility];  Service: Endoscopy;;   TONSILLECTOMY     TOTAL KNEE ARTHROPLASTY Right 02/08/2021   Procedure: TOTAL KNEE ARTHROPLASTY;  Surgeon: Vickki Hearing, MD;  Location: [facility];  Service: Orthopedics;  Laterality: Right;    Social History: Social History   Socioeconomic History   Marital status: Married    Spouse name: Not on file   Number of children: 1   Years of education: Not on file   Highest education level: Not on file  Occupational History   Occupation: respiratory therapist    Employer: [employer]  Tobacco Use   Smoking status: Former    Current packs/day: 0.00    Average packs/day: 0.5 packs/day for 20.0 years (10.0 ttl pk-yrs)  Types: Pipe, Cigarettes    Start date: 04/03/1970    Quit date: 04/03/1990    Years since quitting: 32.7   Smokeless tobacco: Never   Tobacco comments:    quit many years ago  Vaping Use   Vaping status: Never Used  Substance and Sexual Activity   Alcohol use: Not Currently    Comment: occassional   Drug use: No   Sexual activity: Yes  Other Topics Concern   Not on file  Social History Narrative   Not on file   Social Determinants of Health   Financial Resource Strain: Not on file  Food Insecurity: No Food Insecurity (11/02/2021)   Hunger Vital Sign    Worried About Running Out of Food in the Last Year: Never true    Ran Out of Food in the Last Year: Never true  Transportation Needs: No Transportation Needs (11/02/2021)   PRAPARE - Administrator, Civil Service (Medical): No    Lack of Transportation (Non-Medical): No  Physical Activity: Not on file  Stress: Not on file  Social Connections: Not on file  Intimate Partner Violence: Not At Risk (11/02/2021)   Humiliation, Afraid, Rape, and Kick questionnaire    Fear of Current or Ex-Partner: No    Emotionally Abused: No    Physically Abused: No     Sexually Abused: No    Family History: Family History  Problem Relation Age of Onset   Suicidality Father    Colon cancer Neg Hx    Liver disease Neg Hx    Inflammatory bowel disease Neg Hx    Anesthesia problems Neg Hx     Current Medications:  Current Outpatient Medications:    albuterol (VENTOLIN HFA) 108 (90 Base) MCG/ACT inhaler, INHALE ONE PUFF INTO THE LUNGS FOUR TIMES DAILY AS NEEDED, Disp: 17 each, Rfl: 1   aspirin 325 MG tablet, Take 325 mg by mouth daily., Disp: , Rfl:    atorvastatin (LIPITOR) 40 MG tablet, TAKE ONE TABLET BY MOUTH EVERY NIGHT AT BEDTIME, Disp: 90 tablet, Rfl: 0   Calcium Citrate-Vitamin D (CALCIUM + D PO), Take 2 tablets by mouth daily., Disp: , Rfl:    diclofenac (VOLTAREN) 75 MG EC tablet, Take 1 tablet (75 mg total) by mouth 2 (two) times daily as needed., Disp: 60 tablet, Rfl: 0   DULoxetine (CYMBALTA) 60 MG capsule, TAKE 1 (ONE) CAPSULE BY MOUTH TWO TIMES DAILY, Disp: 180 capsule, Rfl: 1   famotidine (PEPCID) 10 MG tablet, Take 1 tablet (10 mg total) by mouth daily., Disp: 30 tablet, Rfl: 0   ferrous sulfate 325 (65 FE) MG tablet, Take 325 mg by mouth daily with breakfast., Disp: , Rfl:    fluticasone-salmeterol (ADVAIR) 250-50 MCG/ACT AEPB, Inhale 1 puff into the lungs in the morning and at bedtime., Disp: 120 each, Rfl: 2   meloxicam (MOBIC) 7.5 MG tablet, Take 1 tablet (7.5 mg total) by mouth daily., Disp: 30 tablet, Rfl: 0   Misc Natural Products (GLUCOSAMINE CHOND DOUBLE STR PO), Take 2 tablets by mouth daily., Disp: , Rfl:    Multiple Vitamin (MULTIVITAMIN) tablet, Take 1 tablet by mouth daily. Men's 50+, Disp: , Rfl:    polyethylene glycol (MIRALAX / GLYCOLAX) 17 g packet, Take 17 g by mouth daily as needed for mild constipation. (Patient taking differently: Take 17 g by mouth daily.), Disp: 14 each, Rfl: 0   Allergies: Allergies  Allergen Reactions   Sulfa Antibiotics Rash    Childhood  REVIEW OF SYSTEMS:   Review of Systems   Constitutional:  Negative for chills, fatigue and fever.  HENT:   Negative for lump/mass, mouth sores, nosebleeds, sore throat and trouble swallowing.   Eyes:  Negative for eye problems.  Respiratory:  Negative for cough and shortness of breath.   Cardiovascular:  Negative for chest pain, leg swelling and palpitations.  Gastrointestinal:  Negative for abdominal pain, constipation, diarrhea, nausea and vomiting.  Genitourinary:  Negative for bladder incontinence, difficulty urinating, dysuria, frequency, hematuria and nocturia.   Musculoskeletal:  Negative for arthralgias, back pain, flank pain, myalgias and neck pain.  Skin:  Negative for itching and rash.  Neurological:  Negative for dizziness, headaches and numbness.  Hematological:  Does not bruise/bleed easily.  Psychiatric/Behavioral:  Negative for depression, sleep disturbance and suicidal ideas. The patient is not nervous/anxious.   All other systems reviewed and are negative.    VITALS:   Blood pressure (!) 145/95, pulse (!) 102, temperature 98.2 F (36.8 C), temperature source Oral, resp. rate 18, weight 182 lb 14.4 oz (83 kg), SpO2 95%.  Wt Readings from Last 3 Encounters:  01/10/23 182 lb 14.4 oz (83 kg)  10/03/22 179 lb 6.4 oz (81.4 kg)  09/22/22 177 lb 9.6 oz (80.6 kg)    Body mass index is 25.51 kg/m.  Performance status (ECOG): 1 - Symptomatic but completely ambulatory  PHYSICAL EXAM:   Physical Exam Vitals and nursing note reviewed. Exam conducted with a chaperone present.  Constitutional:      Appearance: Normal appearance.  Cardiovascular:     Rate and Rhythm: Normal rate and regular rhythm.     Pulses: Normal pulses.     Heart sounds: Normal heart sounds.  Pulmonary:     Effort: Pulmonary effort is normal.     Breath sounds: Normal breath sounds.  Abdominal:     Palpations: Abdomen is soft. There is no hepatomegaly, splenomegaly or mass.     Tenderness: There is no abdominal tenderness.   Musculoskeletal:     Right lower leg: No edema.     Left lower leg: No edema.  Lymphadenopathy:     Cervical: No cervical adenopathy.     Right cervical: No superficial, deep or posterior cervical adenopathy.    Left cervical: No superficial, deep or posterior cervical adenopathy.     Upper Body:     Right upper body: No supraclavicular or axillary adenopathy.     Left upper body: No supraclavicular or axillary adenopathy.  Neurological:     General: No focal deficit present.     Mental Status: He is alert and oriented to person, place, and time.  Psychiatric:        Mood and Affect: Mood normal.        Behavior: Behavior normal.     LABS:      Latest Ref Rng & Units 01/03/2023   12:25 PM 09/26/2022    2:55 PM 06/20/2022   12:24 PM  CBC  WBC 4.0 - 10.5 K/uL 9.1  8.9  8.6   Hemoglobin 13.0 - 17.0 g/dL 16.1  09.6  04.5   Hematocrit 39.0 - 52.0 % 48.0  44.8  50.1   Platelets 150 - 400 K/uL 293  253  291       Latest Ref Rng & Units 01/03/2023   12:25 PM 09/26/2022    2:55 PM 07/12/2022    9:12 AM  CMP  Glucose 70 - 99 mg/dL 82  69  89  BUN 8 - 23 mg/dL 17  17  16    Creatinine 0.61 - 1.24 mg/dL 1.61  0.96  0.45   Sodium 135 - 145 mmol/L 135  137  140   Potassium 3.5 - 5.1 mmol/L 4.4  3.9  5.3   Chloride 98 - 111 mmol/L 103  103  103   CO2 22 - 32 mmol/L 27  27  24    Calcium 8.9 - 10.3 mg/dL 9.3  9.3  9.6   Total Protein 6.5 - 8.1 g/dL 7.3  6.7  6.5   Total Bilirubin <1.2 mg/dL 0.3  0.7  0.4   Alkaline Phos 38 - 126 U/L 103  94  138   AST 15 - 41 U/L 26  23  23    ALT 0 - 44 U/L 24  24  32      Lab Results  Component Value Date   CEA1 3.7 01/03/2023   /  CEA  Date Value Ref Range Status  01/03/2023 3.7 0.0 - 4.7 ng/mL Final    Comment:    (NOTE)                             Nonsmokers          <3.9                             Smokers             <5.6 Roche [employer] Electrochemiluminescence Immunoassay (ECLIA) Values obtained with different assay methods or  kits cannot be used interchangeably.  Results cannot be interpreted as absolute evidence of the presence or absence of malignant disease. Performed At: [facility] [address] [place], [place] [id number] Jolene Schimke MD NW:[id number]    No results found for: "PSA1" No results found for: "CAN199" No results found for: "CAN125"  No results found for: "TOTALPROTELP", "ALBUMINELP", "A1GS", "A2GS", "BETS", "BETA2SER", "GAMS", "MSPIKE", "SPEI" Lab Results  Component Value Date   TIBC 391 01/03/2023   TIBC 373 09/26/2022   TIBC 432 06/20/2022   FERRITIN 47 01/03/2023   FERRITIN 38 09/26/2022   FERRITIN 38 06/20/2022   IRONPCTSAT 17 (L) 01/03/2023   IRONPCTSAT 18 09/26/2022   IRONPCTSAT 22 06/20/2022   No results found for: "LDH"   STUDIES:   CT ABDOMEN PELVIS W CONTRAST  Result Date: 01/09/2023 CLINICAL DATA:  Follow-up colon carcinoma. Surveillance. * Tracking Code: BO * EXAM: CT ABDOMEN AND PELVIS WITH CONTRAST TECHNIQUE: Multidetector CT imaging of the abdomen and pelvis was performed using the standard protocol following bolus administration of intravenous contrast. RADIATION DOSE REDUCTION: This exam was performed according to the departmental dose-optimization program which includes automated exposure control, adjustment of the mA and/or kV according to patient size and/or use of iterative reconstruction technique. CONTRAST:  OMNIPAQUE IOHEXOL 300 MG/ML  SOLN COMPARISON:  06/20/2022 FINDINGS: Lower Chest: No acute findings. Chronic bibasilar interstitial fibrosis again noted. Hepatobiliary: No suspicious hepatic masses identified. Gallbladder is unremarkable. No evidence of biliary ductal dilatation. Pancreas:  No mass or inflammatory changes. Spleen: Within normal limits in size and appearance. Adrenals/Urinary Tract: No suspicious masses identified. No evidence of ureteral calculi or hydronephrosis. Stomach/Bowel: Stable postop changes from right hemicolectomy.  No soft tissue mass identified. No evidence of obstruction, inflammatory process or abnormal fluid collections. Vascular/Lymphatic: No pathologically enlarged lymph nodes. No acute vascular findings. 4.8 cm infrarenal abdominal aortic  aneurysm shows slight increase in size from 4.6 cm previously. Reproductive:  No mass or other significant abnormality. Other:  None. Musculoskeletal: No suspicious bone lesions identified. Old L1 and L4 vertebral body compression fractures again noted. IMPRESSION: No evidence of recurrent or metastatic carcinoma within the abdomen or pelvis. Slight increase in size of 4.8 cm infrarenal abdominal aortic aneurysm. Recommend follow-up CT or MR as appropriate in 12 months and referral to or continued care with vascular specialist. (Ref.: J Vasc Surg. 2018; 67:2-77 and J Am Coll Radiol 2013;10(10):789-794.) Chronic bibasilar pulmonary interstitial fibrosis. Electronically Signed   By: Danae Orleans M.D.   On: 01/09/2023 10:22

## 2023-01-10 NOTE — Patient Instructions (Signed)
[facility] at [facility] Discharge Instructions   You were seen and examined today by Dr. Ellin Saba.  He reviewed the results of your lab work which are normal.   He reviewed the results of your CT scan which is normal. There is no evidence of cancer.   We will see you back in 3 months. We will repeat lab work prior to this visit.   Return as scheduled.    Thank you for choosing [facility] at [facility] to provide your oncology and hematology care.  To afford each patient quality time with our provider, please arrive at least 15 minutes before your scheduled appointment time.   If you have a lab appointment with the Cancer Center please come in thru the Main Entrance and check in at the main information desk.  You need to re-schedule your appointment should you arrive 10 or more minutes late.  We strive to give you quality time with our providers, and arriving late affects you and other patients whose appointments are after yours.  Also, if you no show three or more times for appointments you may be dismissed from the clinic at the providers discretion.     Again, thank you for choosing [facility].  Our hope is that these requests will decrease the amount of time that you wait before being seen by our physicians.       _____________________________________________________________  Should you have questions after your visit to [facility], please contact our office at [phone] and follow the prompts.  Our office hours are 8:00 a.m. and 4:30 p.m. Monday - Friday.  Please note that voicemails left after 4:00 p.m. may not be returned until the following business day.  We are closed weekends and major holidays.  You do have access to a nurse 24-7, just call the main number to the clinic [phone] and do not press any options, hold on the line and a nurse will answer the phone.    For prescription refill requests,  have your pharmacy contact our office and allow 72 hours.    Due to Covid, you will need to wear a mask upon entering the hospital. If you do not have a mask, a mask will be given to you at the Main Entrance upon arrival. For doctor visits, patients may have 1 support person age 57 or older with them. For treatment visits, patients can not have anyone with them due to social distancing guidelines and our immunocompromised population.

## 2023-01-28 NOTE — Progress Notes (Unsigned)
Patient name: Albert Gonzalez MRN: [id number] DOB: September 04, 1950 Sex: male  REASON FOR VISIT: Follow-up AAA  HPI: Albert Gonzalez is a 72 y.o. male with history COPD, colon cancer, hyperlipidemia the presents for follow-up of his AAA.  Last seen by Dr. Arbie Cookey on 06/21/2022 with a 4.9 cm AAA by ultrasound.  Patient returns and is has recently had a CT on 01/03/2023 showing a 4.8 cm AAA.  Patient denies any new abdominal or back pain.  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  Past Medical History:  Diagnosis Date   Anxiety    Arthritis    Cancer (HCC)    COPD (chronic obstructive pulmonary disease) (HCC)    Depression    History of kidney stones    Hyperlipidemia    Inguinal hernia right    Insomnia    Myalgia    PPD positive, treated 1979   1 year of INH   Raynaud phenomenon    Restless leg syndrome    Sarcoidosis 1980    Past Surgical History:  Procedure Laterality Date   AXILLARY LYMPH NODE BIOPSY Right 1980   BIOPSY  09/28/2021   Procedure: BIOPSY;  Surgeon: Corbin Ade, MD;  [facility];  Service: Endoscopy;;   COLON SURGERY     COLONOSCOPY  02/08/2011   Procedure: COLONOSCOPY;  Surgeon: Corbin Ade, MD;  [facility];  Service: Endoscopy;  Laterality: N/A;  1:30 PM   COLONOSCOPY WITH PROPOFOL N/A 09/28/2021   Procedure: COLONOSCOPY WITH PROPOFOL;  Surgeon: Corbin Ade, MD;  [facility]: [facility];  Service: Endoscopy;  Laterality: N/A;  2:15pm   INGUINAL HERNIA REPAIR Right 01/29/2017   Procedure: HERNIA REPAIR INGUINAL ADULT WITH MESH;  Surgeon: Lucretia Roers, MD;  Location: [facility];  Service: General;  Laterality: Right;   KNEE ARTHROSCOPY WITH MEDIAL MENISECTOMY Right 04/05/2016   Procedure: RIGHT KNEE ARTHROSCOPY WITH PARTIAL  LATERAL MENISECTOMY, DEBRIDEMENT MEDIAL MENISCUS, ACL DEBRIDEMENT, MICRO FRACTURE OF FEMUR;  Surgeon: Vickki Hearing, MD;  Location: [facility];  Service: Orthopedics;  Laterality: Right;   KNEE  SURGERY     left   KYPHOPLASTY     LAPAROSCOPIC PARTIAL COLECTOMY N/A 11/02/2021   Procedure: LAPAROSCOPIC EXTENDED RIGHT HEMICOLECTOMY;  Surgeon: Lucretia Roers, MD;  Location: [facility];  Service: General;  Laterality: N/A;   POLYPECTOMY  09/28/2021   Procedure: POLYPECTOMY;  Surgeon: Corbin Ade, MD;  [facility];  Service: Endoscopy;;   TONSILLECTOMY     TOTAL KNEE ARTHROPLASTY Right 02/08/2021   Procedure: TOTAL KNEE ARTHROPLASTY;  Surgeon: Vickki Hearing, MD;  Location: [facility];  Service: Orthopedics;  Laterality: Right;    Family History  Problem Relation Age of Onset   Suicidality Father    Colon cancer Neg Hx    Liver disease Neg Hx    Inflammatory bowel disease Neg Hx    Anesthesia problems Neg Hx     SOCIAL HISTORY: Social History   Tobacco Use   Smoking status: Former    Current packs/day: 0.00    Average packs/day: 0.5 packs/day for 20.0 years (10.0 ttl pk-yrs)    Types: Pipe, Cigarettes    Start date: 04/03/1970    Quit date: 04/03/1990    Years since quitting: 32.8   Smokeless tobacco: Never   Tobacco comments:    quit many years ago  Substance Use Topics   Alcohol use: Not Currently    Comment: occassional  Allergies  Allergen Reactions   Sulfa Antibiotics Rash    Childhood     Current Outpatient Medications  Medication Sig Dispense Refill   albuterol (VENTOLIN HFA) 108 (90 Base) MCG/ACT inhaler INHALE ONE PUFF INTO THE LUNGS FOUR TIMES DAILY AS NEEDED 17 each 1   aspirin 325 MG tablet Take 325 mg by mouth daily.     atorvastatin (LIPITOR) 40 MG tablet TAKE ONE TABLET BY MOUTH EVERY NIGHT AT BEDTIME 90 tablet 0   Calcium Citrate-Vitamin D (CALCIUM + D PO) Take 2 tablets by mouth daily.     diclofenac (VOLTAREN) 75 MG EC tablet Take 1 tablet (75 mg total) by mouth 2 (two) times daily as needed. 60 tablet 0   DULoxetine (CYMBALTA) 60 MG capsule TAKE 1 (ONE) CAPSULE BY MOUTH TWO TIMES DAILY 180 capsule 1   famotidine (PEPCID) 10 MG  tablet Take 1 tablet (10 mg total) by mouth daily. 30 tablet 0   ferrous sulfate 325 (65 FE) MG tablet Take 325 mg by mouth daily with breakfast.     fluticasone-salmeterol (ADVAIR) 250-50 MCG/ACT AEPB Inhale 1 puff into the lungs in the morning and at bedtime. 120 each 2   meloxicam (MOBIC) 7.5 MG tablet Take 1 tablet (7.5 mg total) by mouth daily. 30 tablet 0   Misc Natural Products (GLUCOSAMINE CHOND DOUBLE STR PO) Take 2 tablets by mouth daily.     Multiple Vitamin (MULTIVITAMIN) tablet Take 1 tablet by mouth daily. Men's 50+     polyethylene glycol (MIRALAX / GLYCOLAX) 17 g packet Take 17 g by mouth daily as needed for mild constipation. (Patient taking differently: Take 17 g by mouth daily.) 14 each 0   No current facility-administered medications for this visit.    REVIEW OF SYSTEMS:  [X]  denotes positive finding, [ ]  denotes negative finding Cardiac  Comments:  Chest pain or chest pressure:    Shortness of breath upon exertion:    Short of breath when lying flat:    Irregular heart rhythm:        Vascular    Pain in calf, thigh, or hip brought on by ambulation:    Pain in feet at night that wakes you up from your sleep:     Blood clot in your veins:    Leg swelling:         Pulmonary    Oxygen at home:    Productive cough:     Wheezing:         Neurologic    Sudden weakness in arms or legs:     Sudden numbness in arms or legs:     Sudden onset of difficulty speaking or slurred speech:    Temporary loss of vision in one eye:     Problems with dizziness:         Gastrointestinal    Blood in stool:     Vomited blood:         Genitourinary    Burning when urinating:     Blood in urine:        Psychiatric    Major depression:         Hematologic    Bleeding problems:    Problems with blood clotting too easily:        Skin    Rashes or ulcers:        Constitutional    Fever or chills:      PHYSICAL EXAM: There were no vitals filed for this  visit.  GENERAL: The patient is a well-nourished male, in no acute distress. The vital signs are documented above. CARDIAC: There is a regular rate and rhythm.  VASCULAR:  Bilateral femoral pulses palpable Bilateral AT pulses palpable PULMONARY: No respiratory distress. ABDOMEN: Soft and non-tender. MUSCULOSKELETAL: There are no major deformities or cyanosis. NEUROLOGIC: No focal weakness or paresthesias are detected. SKIN: There are no ulcers or rashes noted. PSYCHIATRIC: The patient has a normal affect.  DATA:   CT abdomen pelvis reviewed from 01/03/2023 with 4.8 cm AAA  Assessment/Plan:  72 y.o. male with history COPD, colon cancer, hyperlipidemia the presents for follow-up of his AAA.  Last seen by Dr. Arbie Cookey on 06/21/2022 with a 4.9 cm AAA by ultrasound.  Patient returns and has recently had a CT on 01/03/2023 showing a 4.8 cm AAA.  Discussed there has been no significant interval growth in the last 6 months of his AAA.  He can continue surveillance with another AAA duplex in 6 months.  I discussed guidelines for repair in men is greater than 5.5 cm.   Cephus Shelling, MD [facility] Office: [phone]

## 2023-01-30 ENCOUNTER — Encounter: Payer: Self-pay | Admitting: Vascular Surgery

## 2023-01-30 ENCOUNTER — Ambulatory Visit (INDEPENDENT_AMBULATORY_CARE_PROVIDER_SITE_OTHER): Payer: Medicare HMO | Admitting: Vascular Surgery

## 2023-01-30 VITALS — BP 135/81 | HR 86 | Ht 71.0 in | Wt 181.6 lb

## 2023-01-30 DIAGNOSIS — I714 Abdominal aortic aneurysm, without rupture, unspecified: Secondary | ICD-10-CM

## 2023-02-05 ENCOUNTER — Other Ambulatory Visit: Payer: Self-pay

## 2023-02-05 DIAGNOSIS — I7143 Infrarenal abdominal aortic aneurysm, without rupture: Secondary | ICD-10-CM

## 2023-02-09 ENCOUNTER — Other Ambulatory Visit: Payer: Self-pay | Admitting: Family Medicine

## 2023-02-15 LAB — MOLECULAR PATHOLOGY

## 2023-03-08 ENCOUNTER — Other Ambulatory Visit: Payer: Self-pay | Admitting: Family Medicine

## 2023-03-26 ENCOUNTER — Other Ambulatory Visit: Payer: Self-pay | Admitting: Family Medicine

## 2023-03-26 NOTE — Telephone Encounter (Signed)
Sent to provider for approval

## 2023-04-12 ENCOUNTER — Inpatient Hospital Stay: Payer: Medicare HMO | Attending: Hematology

## 2023-04-12 DIAGNOSIS — Z85038 Personal history of other malignant neoplasm of large intestine: Secondary | ICD-10-CM | POA: Diagnosis not present

## 2023-04-12 DIAGNOSIS — Z08 Encounter for follow-up examination after completed treatment for malignant neoplasm: Secondary | ICD-10-CM | POA: Diagnosis not present

## 2023-04-12 DIAGNOSIS — D509 Iron deficiency anemia, unspecified: Secondary | ICD-10-CM | POA: Diagnosis not present

## 2023-04-12 DIAGNOSIS — C184 Malignant neoplasm of transverse colon: Secondary | ICD-10-CM

## 2023-04-12 DIAGNOSIS — D508 Other iron deficiency anemias: Secondary | ICD-10-CM

## 2023-04-12 LAB — CBC WITH DIFFERENTIAL/PLATELET
Abs Immature Granulocytes: 0.01 10*3/uL (ref 0.00–0.07)
Basophils Absolute: 0.1 10*3/uL (ref 0.0–0.1)
Basophils Relative: 2 %
Eosinophils Absolute: 1.1 10*3/uL — ABNORMAL HIGH (ref 0.0–0.5)
Eosinophils Relative: 12 %
HCT: 49.4 % (ref 39.0–52.0)
Hemoglobin: 16.3 g/dL (ref 13.0–17.0)
Immature Granulocytes: 0 %
Lymphocytes Relative: 20 %
Lymphs Abs: 1.8 10*3/uL (ref 0.7–4.0)
MCH: 28.6 pg (ref 26.0–34.0)
MCHC: 33 g/dL (ref 30.0–36.0)
MCV: 86.8 fL (ref 80.0–100.0)
Monocytes Absolute: 0.7 10*3/uL (ref 0.1–1.0)
Monocytes Relative: 8 %
Neutro Abs: 5 10*3/uL (ref 1.7–7.7)
Neutrophils Relative %: 58 %
Platelets: 298 10*3/uL (ref 150–400)
RBC: 5.69 MIL/uL (ref 4.22–5.81)
RDW: 13.7 % (ref 11.5–15.5)
WBC: 8.8 10*3/uL (ref 4.0–10.5)
nRBC: 0 % (ref 0.0–0.2)

## 2023-04-12 LAB — COMPREHENSIVE METABOLIC PANEL
ALT: 24 U/L (ref 0–44)
AST: 25 U/L (ref 15–41)
Albumin: 4.5 g/dL (ref 3.5–5.0)
Alkaline Phosphatase: 102 U/L (ref 38–126)
Anion gap: 10 (ref 5–15)
BUN: 20 mg/dL (ref 8–23)
CO2: 26 mmol/L (ref 22–32)
Calcium: 9.8 mg/dL (ref 8.9–10.3)
Chloride: 102 mmol/L (ref 98–111)
Creatinine, Ser: 1.01 mg/dL (ref 0.61–1.24)
GFR, Estimated: >60 mL/min (ref >60)
Glucose, Bld: 98 mg/dL (ref 70–99)
Potassium: 4.4 mmol/L (ref 3.5–5.1)
Sodium: 138 mmol/L (ref 135–145)
Total Bilirubin: 0.6 mg/dL (ref 0.0–1.2)
Total Protein: 7.7 g/dL (ref 6.5–8.1)

## 2023-04-12 LAB — IRON AND TIBC
Iron: 69 ug/dL (ref 45–182)
Saturation Ratios: 17 % — ABNORMAL LOW (ref 17.9–39.5)
TIBC: 415 ug/dL (ref 250–450)
UIBC: 346 ug/dL

## 2023-04-12 LAB — FERRITIN: Ferritin: 38 ng/mL (ref 24–336)

## 2023-04-13 LAB — CEA: CEA: 4.2 ng/mL (ref 0.0–4.7)

## 2023-04-19 ENCOUNTER — Inpatient Hospital Stay: Payer: Medicare HMO | Admitting: Hematology

## 2023-05-03 ENCOUNTER — Inpatient Hospital Stay: Payer: Medicare HMO | Attending: Hematology | Admitting: Hematology

## 2023-05-03 VITALS — BP 115/81 | HR 89 | Temp 98.0°F | Resp 18 | Ht 71.0 in | Wt 177.0 lb

## 2023-05-03 DIAGNOSIS — C184 Malignant neoplasm of transverse colon: Secondary | ICD-10-CM

## 2023-05-03 DIAGNOSIS — Z85038 Personal history of other malignant neoplasm of large intestine: Secondary | ICD-10-CM | POA: Insufficient documentation

## 2023-05-03 DIAGNOSIS — D649 Anemia, unspecified: Secondary | ICD-10-CM | POA: Insufficient documentation

## 2023-05-03 DIAGNOSIS — D508 Other iron deficiency anemias: Secondary | ICD-10-CM | POA: Diagnosis not present

## 2023-05-03 DIAGNOSIS — Z87891 Personal history of nicotine dependence: Secondary | ICD-10-CM | POA: Diagnosis not present

## 2023-05-03 NOTE — Patient Instructions (Signed)
 [facility] at [facility] Discharge Instructions   You were seen and examined today by Dr. Ellin Saba.  He reviewed the results of your lab work which are normal/stable.   We will see you back in 3 months. We will repeat labs and a CT scan prior to this visit.    Return as scheduled.    Thank you for choosing [facility] at [facility] to provide your oncology and hematology care.  To afford each patient quality time with our provider, please arrive at least 15 minutes before your scheduled appointment time.   If you have a lab appointment with the Cancer Center please come in thru the Main Entrance and check in at the main information desk.  You need to re-schedule your appointment should you arrive 10 or more minutes late.  We strive to give you quality time with our providers, and arriving late affects you and other patients whose appointments are after yours.  Also, if you no show three or more times for appointments you may be dismissed from the clinic at the providers discretion.     Again, thank you for choosing [facility].  Our hope is that these requests will decrease the amount of time that you wait before being seen by our physicians.       _____________________________________________________________  Should you have questions after your visit to [facility], please contact our office at [phone] and follow the prompts.  Our office hours are 8:00 a.m. and 4:30 p.m. Monday - Friday.  Please note that voicemails left after 4:00 p.m. may not be returned until the following business day.  We are closed weekends and major holidays.  You do have access to a nurse 24-7, just call the main number to the clinic [phone] and do not press any options, hold on the line and a nurse will answer the phone.    For prescription refill requests, have your pharmacy contact our office and allow 72 hours.    Due to  Covid, you will need to wear a mask upon entering the hospital. If you do not have a mask, a mask will be given to you at the Main Entrance upon arrival. For doctor visits, patients may have 1 support person age 21 or older with them. For treatment visits, patients can not have anyone with them due to social distancing guidelines and our immunocompromised population.

## 2023-05-03 NOTE — Progress Notes (Signed)
 [facility] [address], [place] [address]    Clinic Day:  05/03/23   Referring physician: [name], FNP  Patient Care Team: [name], FNP as PCP - General (Family Medicine) [name], MD (Gastroenterology) [name], MD as Medical Oncologist (Medical Oncology) [name], RN as Oncology Nurse Navigator (Medical Oncology)   ASSESSMENT & PLAN:   Assessment: 1.  Stage II (PT3 N0) ascending colon adenocarcinoma: - Colonoscopy (09/28/2021): Exophytic apple core appearing mass in the proximal colon.  Location in relation to the hepatic flexure or cecum could not be ascertained with certainty.  Nonbleeding internal hemorrhoids. - CEA on 09/28/2021-2.8 - CT AP (10/12/2021): Apple core lesion in the hepatic flexure with no evidence of metastatic disease in the abdomen or pelvis. - Laparoscopic right hemicolectomy by Dr. Henreitta Leber on 11/02/2021 - Pathology: Moderately differentiated adenocarcinoma extending into pericolonic connective tissue, margins negative, 0/24 lymph nodes involved, negative LVI and PNI, MMR preserved. - Personal history of sarcoidosis from a left axillary lymph node biopsy prior to 2000   2.  Social/family history: - He lives at home with his wife.  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  He smoked a pipe briefly for less than a year but was never cigarette smoker.  Maternal uncle had esophageal cancer.    Plan: 1.  Stage II (PT3N0) ascending colon adenocarcinoma: - Denies any change in bowel habits or bleeding per rectum.  He takes Metamucil daily. - He is putting off colonoscopy as his wife is sick. - Last CTAP in November 2024 with no evidence of metastatic disease. - He was evaluated by vascular surgery for abdominal aortic aneurysm and 68-month follow-up with ultrasound was recommended. - Labs today: Normal LFTs and CBC.  CEA was normal. - Recommend follow-up in 3  months with repeat CT AP with contrast and CEA level. - She will be referred back to Dr. [name] for colonoscopy.   2.  Normocytic anemia: - He is off of iron tablet for the last 3 months.  Ferritin is 38, down from 47.  However hemoglobin is stable at 16.3.  Will repeat ferritin and iron panel at next visit.    Orders Placed This Encounter  Procedures   CT ABDOMEN PELVIS W CONTRAST    Standing Status:   Future    Expected Date:   08/03/2023    Expiration Date:   05/02/2024    If indicated for the ordered procedure, I authorize the administration of contrast media per Radiology protocol:   Yes    Does the patient have a contrast media/X-ray dye allergy?:   No    Preferred imaging location?:   [facility]    If indicated for the ordered procedure, I authorize the administration of oral contrast media per Radiology protocol:   Yes   CBC with Differential    Standing Status:   Future    Expected Date:   07/30/2023    Expiration Date:   05/02/2024   Comprehensive metabolic panel    Standing Status:   Future    Expected Date:   07/30/2023    Expiration Date:   05/02/2024   CEA    Standing Status:   Future    Expected Date:   07/30/2023    Expiration Date:   05/02/2024   Iron and TIBC ([facility] ONLY)    Standing Status:   Future    Expected Date:  07/30/2023    Expiration Date:   05/02/2024   Ferritin    Standing Status:   Future    Expected Date:   07/30/2023    Expiration Date:   05/02/2024      Albert Gonzalez,acting as a scribe for [name], MD.,have documented all relevant documentation on the behalf of [name], MD,as directed by  [name], MD while in the presence of [name], MD.  I, [name] MD, have reviewed the above documentation for accuracy and completeness, and I agree with the above.     [name], MD   3/6/20253:24 PM  CHIEF COMPLAINT:   Diagnosis: stage II right colon cancer     Cancer Staging  No matching staging information was found for the patient.    Prior Therapy: Right hemicolectomy on 11/02/2021   Current Therapy:  Surveillance    HISTORY OF PRESENT ILLNESS:   Oncology History   No history exists.     INTERVAL HISTORY:   Albert Gonzalez is a 73 y.o. male presenting to clinic today for follow up of stage II right colon cancer. He was last seen by me on 01/10/23.  Today, he states that he is doing well overall. His appetite level is at 100%. His energy level is at 50%. He denies any bowel issues. Albert Gonzalez takes Metamucil and fiber. He notes a normal appetite, though he has lost 5 pounds since his last visit. Albert Gonzalez has discontinued iron supplements. He has not done his colonoscopy due to his wife having health issues but is planning to do one in the near future. Albert Gonzalez will need a new pulmonologist sometime in the future, though this is not emergent.   PAST MEDICAL HISTORY:   Past Medical History: Past Medical History:  Diagnosis Date   Anxiety    Arthritis    Cancer (HCC)    COPD (chronic obstructive pulmonary disease) (HCC)    Depression    History of kidney stones    Hyperlipidemia    Inguinal hernia right    Insomnia    Myalgia    PPD positive, treated 1979   1 year of INH   Raynaud phenomenon    Restless leg syndrome    Sarcoidosis 1980    Surgical History: Past Surgical History:  Procedure Laterality Date   AXILLARY LYMPH NODE BIOPSY Right 1980   BIOPSY  09/28/2021   Procedure: BIOPSY;  Surgeon: Corbin Ade, MD;  [facility];  Service: Endoscopy;;   COLON SURGERY     COLONOSCOPY  02/08/2011   Procedure: COLONOSCOPY;  Surgeon: Corbin Ade, MD;  [facility];  Service: Endoscopy;  Laterality: N/A;  1:30 PM   COLONOSCOPY WITH PROPOFOL N/A 09/28/2021   Procedure: COLONOSCOPY WITH PROPOFOL;  Surgeon: Corbin Ade, MD;  [facility];  Service: Endoscopy;  Laterality: N/A;  2:15pm   INGUINAL HERNIA  REPAIR Right 01/29/2017   Procedure: HERNIA REPAIR INGUINAL ADULT WITH MESH;  Surgeon: Lucretia Roers, MD;  Location: [facility];  Service: General;  Laterality: Right;   KNEE ARTHROSCOPY WITH MEDIAL MENISECTOMY Right 04/05/2016   Procedure: RIGHT KNEE ARTHROSCOPY WITH PARTIAL  LATERAL MENISECTOMY, DEBRIDEMENT MEDIAL MENISCUS, ACL DEBRIDEMENT, MICRO FRACTURE OF FEMUR;  Surgeon: Vickki Hearing, MD;  Location: [facility];  Service: Orthopedics;  Laterality: Right;   KNEE SURGERY     left   KYPHOPLASTY     LAPAROSCOPIC PARTIAL COLECTOMY N/A 11/02/2021   Procedure: LAPAROSCOPIC EXTENDED RIGHT  HEMICOLECTOMY;  Surgeon: Lucretia Roers, MD;  Location: [facility];  Service: General;  Laterality: N/A;   POLYPECTOMY  09/28/2021   Procedure: POLYPECTOMY;  Surgeon: Corbin Ade, MD;  [facility];  Service: Endoscopy;;   TONSILLECTOMY     TOTAL KNEE ARTHROPLASTY Right 02/08/2021   Procedure: TOTAL KNEE ARTHROPLASTY;  Surgeon: Vickki Hearing, MD;  Location: [facility];  Service: Orthopedics;  Laterality: Right;    Social History: Social History   Socioeconomic History   Marital status: Married    Spouse name: Not on file   Number of children: 1   Years of education: Not on file   Highest education level: Not on file  Occupational History   Occupation: respiratory therapist    Employer: [employer]  Tobacco Use   Smoking status: Former    Current packs/day: 0.00    Average packs/day: 0.5 packs/day for 20.0 years (10.0 ttl pk-yrs)    Types: Pipe, Cigarettes    Start date: 04/03/1970    Quit date: 04/03/1990    Years since quitting: 33.1   Smokeless tobacco: Never   Tobacco comments:    quit many years ago  Vaping Use   Vaping status: Never Used  Substance and Sexual Activity   Alcohol use: Not Currently    Comment: occassional   Drug use: No   Sexual activity: Yes  Other Topics Concern   Not on file  Social History Narrative   Not on file   Social  Drivers of Health   Financial Resource Strain: Not on file  Food Insecurity: No Food Insecurity (11/02/2021)   Hunger Vital Sign    Worried About Running Out of Food in the Last Year: Never true    Ran Out of Food in the Last Year: Never true  Transportation Needs: No Transportation Needs (11/02/2021)   PRAPARE - Administrator, Civil Service (Medical): No    Lack of Transportation (Non-Medical): No  Physical Activity: Not on file  Stress: Not on file  Social Connections: Not on file  Intimate Partner Violence: Not At Risk (11/02/2021)   Humiliation, Afraid, Rape, and Kick questionnaire    Fear of Current or Ex-Partner: No    Emotionally Abused: No    Physically Abused: No    Sexually Abused: No    Family History: Family History  Problem Relation Age of Onset   Suicidality Father    Colon cancer Neg Hx    Liver disease Neg Hx    Inflammatory bowel disease Neg Hx    Anesthesia problems Neg Hx     Current Medications:  Current Outpatient Medications:    albuterol (VENTOLIN HFA) 108 (90 Base) MCG/ACT inhaler, INHALE ONE PUFF INTO THE LUNGS FOUR TIMES DAILY AS NEEDED, Disp: 17 each, Rfl: 1   aspirin 325 MG tablet, Take 325 mg by mouth daily., Disp: , Rfl:    atorvastatin (LIPITOR) 40 MG tablet, TAKE ONE TABLET BY MOUTH EVERY NIGHT AT BEDTIME, Disp: 90 tablet, Rfl: 0   Calcium Citrate-Vitamin D (CALCIUM + D PO), Take 2 tablets by mouth daily., Disp: , Rfl:    diclofenac (VOLTAREN) 75 MG EC tablet, TAKE ONE TABLET (75MG  TOTAL) BY MOUTH TWO TIMES DAILY AS NEEDED, Disp: 60 tablet, Rfl: 0   DULoxetine (CYMBALTA) 60 MG capsule, TAKE 1 (ONE) CAPSULE BY MOUTH TWO TIMES DAILY, Disp: 180 capsule, Rfl: 1   famotidine (PEPCID) 10 MG tablet, Take 1 tablet (10 mg total) by mouth  daily., Disp: 30 tablet, Rfl: 0   fluticasone-salmeterol (ADVAIR) 250-50 MCG/ACT AEPB, Inhale 1 puff into the lungs in the morning and at bedtime., Disp: 120 each, Rfl: 2   meloxicam (MOBIC) 7.5 MG tablet, Take  1 tablet (7.5 mg total) by mouth daily., Disp: 30 tablet, Rfl: 0   Misc Natural Products (GLUCOSAMINE CHOND DOUBLE STR PO), Take 2 tablets by mouth daily., Disp: , Rfl:    Multiple Vitamin (MULTIVITAMIN) tablet, Take 1 tablet by mouth daily. Men's 50+, Disp: , Rfl:    polyethylene glycol (MIRALAX / GLYCOLAX) 17 g packet, Take 17 g by mouth daily as needed for mild constipation. (Patient taking differently: Take 17 g by mouth daily.), Disp: 14 each, Rfl: 0   Allergies: Allergies  Allergen Reactions   Sulfa Antibiotics Rash    Childhood     REVIEW OF SYSTEMS:   Review of Systems  Constitutional:  Negative for chills, fatigue and fever.  HENT:   Negative for lump/mass, mouth sores, nosebleeds, sore throat and trouble swallowing.   Eyes:  Negative for eye problems.  Respiratory:  Positive for shortness of breath. Negative for cough.   Cardiovascular:  Negative for chest pain, leg swelling and palpitations.  Gastrointestinal:  Negative for abdominal pain, constipation, diarrhea, nausea and vomiting.  Genitourinary:  Negative for bladder incontinence, difficulty urinating, dysuria, frequency, hematuria and nocturia.   Musculoskeletal:  Negative for arthralgias, back pain, flank pain, myalgias and neck pain.  Skin:  Negative for itching and rash.  Neurological:  Negative for dizziness, headaches and numbness.  Hematological:  Does not bruise/bleed easily.  Psychiatric/Behavioral:  Positive for depression. Negative for sleep disturbance and suicidal ideas. The patient is not nervous/anxious.   All other systems reviewed and are negative.    VITALS:   Blood pressure 115/81, pulse 89, temperature 98 F (36.7 C), temperature source Tympanic, resp. rate 18, height 5\' 11"  (1.803 m), weight 177 lb (80.3 kg), SpO2 96%.  Wt Readings from Last 3 Encounters:  05/03/23 177 lb (80.3 kg)  01/30/23 181 lb 9.6 oz (82.4 kg)  01/10/23 182 lb 14.4 oz (83 kg)    Body mass index is 24.69  kg/m.  Performance status (ECOG): 1 - Symptomatic but completely ambulatory  PHYSICAL EXAM:   Physical Exam Vitals and nursing note reviewed. Exam conducted with a chaperone present.  Constitutional:      Appearance: Normal appearance.  Cardiovascular:     Rate and Rhythm: Normal rate and regular rhythm.     Pulses: Normal pulses.     Heart sounds: Normal heart sounds.  Pulmonary:     Effort: Pulmonary effort is normal.     Breath sounds: Normal breath sounds.  Abdominal:     Palpations: Abdomen is soft. There is no hepatomegaly, splenomegaly or mass.     Tenderness: There is no abdominal tenderness.  Musculoskeletal:     Right lower leg: No edema.     Left lower leg: No edema.  Lymphadenopathy:     Cervical: No cervical adenopathy.     Right cervical: No superficial, deep or posterior cervical adenopathy.    Left cervical: No superficial, deep or posterior cervical adenopathy.     Upper Body:     Right upper body: No supraclavicular or axillary adenopathy.     Left upper body: No supraclavicular or axillary adenopathy.  Neurological:     General: No focal deficit present.     Mental Status: He is alert and oriented to person, place, and time.  Psychiatric:        Mood and Affect: Mood normal.        Behavior: Behavior normal.     LABS:      Latest Ref Rng & Units 04/12/2023    3:17 PM 01/03/2023   12:25 PM 09/26/2022    2:55 PM  CBC  WBC 4.0 - 10.5 K/uL 8.8  9.1  8.9   Hemoglobin 13.0 - 17.0 g/dL 16.1  09.6  04.5   Hematocrit 39.0 - 52.0 % 49.4  48.0  44.8   Platelets 150 - 400 K/uL 298  293  253       Latest Ref Rng & Units 04/12/2023    3:17 PM 01/03/2023   12:25 PM 09/26/2022    2:55 PM  CMP  Glucose 70 - 99 mg/dL 98  82  69   BUN 8 - 23 mg/dL 20  17  17    Creatinine 0.61 - 1.24 mg/dL 4.09  8.11  9.14   Sodium 135 - 145 mmol/L 138  135  137   Potassium 3.5 - 5.1 mmol/L 4.4  4.4  3.9   Chloride 98 - 111 mmol/L 102  103  103   CO2 22 - 32 mmol/L 26  27  27     Calcium 8.9 - 10.3 mg/dL 9.8  9.3  9.3   Total Protein 6.5 - 8.1 g/dL 7.7  7.3  6.7   Total Bilirubin 0.0 - 1.2 mg/dL 0.6  0.3  0.7   Alkaline Phos 38 - 126 U/L 102  103  94   AST 15 - 41 U/L 25  26  23    ALT 0 - 44 U/L 24  24  24       Lab Results  Component Value Date   CEA1 4.2 04/12/2023   /  CEA  Date Value Ref Range Status  04/12/2023 4.2 0.0 - 4.7 ng/mL Final    Comment:    (NOTE)                             Nonsmokers          <3.9                             Smokers             <5.6 Roche [employer] Electrochemiluminescence Immunoassay (ECLIA) Values obtained with different assay methods or kits cannot be used interchangeably.  Results cannot be interpreted as absolute evidence of the presence or absence of malignant disease. Performed At: [facility] [address] [place], [place] [id number] Jolene Schimke MD [id number]    No results found for: "PSA1" No results found for: [phone]" No results found for: "CAN125"  No results found for: "TOTALPROTELP", "ALBUMINELP", "A1GS", "A2GS", "BETS", "BETA2SER", "GAMS", "MSPIKE", "SPEI" Lab Results  Component Value Date   TIBC 415 04/12/2023   TIBC 391 01/03/2023   TIBC 373 09/26/2022   FERRITIN 38 04/12/2023   FERRITIN 47 01/03/2023   FERRITIN 38 09/26/2022   IRONPCTSAT 17 (L) 04/12/2023   IRONPCTSAT 17 (L) 01/03/2023   IRONPCTSAT 18 09/26/2022   No results found for: "LDH"   STUDIES:   No results found.

## 2023-05-10 ENCOUNTER — Other Ambulatory Visit: Payer: Self-pay | Admitting: Family Medicine

## 2023-05-31 ENCOUNTER — Encounter: Payer: Self-pay | Admitting: *Deleted

## 2023-06-07 ENCOUNTER — Ambulatory Visit: Payer: Self-pay | Admitting: Family Medicine

## 2023-06-07 NOTE — Telephone Encounter (Signed)
 Was advised ER and patient states he will go

## 2023-06-07 NOTE — Telephone Encounter (Signed)
 Copied from CRM [id number]. Topic: Clinical - Pink Word Triage >> Jun 07, 2023 11:03 AM Fuller Mandril wrote: Reason for Triage: Diarrhea for over a week - has been taking otc medication, not helping. Previous colon cancer.   Chief Complaint: Diarrhea and Blurred Symptoms: diarrhea x 1 week; sudden vision change this morning, fever Frequency: constant Pertinent Negatives: Patient denies chest pain Disposition: [x] ED /[] Urgent Care (no appt availability in office) / [] Appointment(In office/virtual)/ []  [facility]/ [] Home Care/ [] Refused Recommended Disposition /[] [facility]/ []  Follow-up with PCP Additional Notes: Patient reports diarrhea x 1 week and sudden blurred vision since this morning. Pt sts that he can see far distance but has trouble reading fine print. Reports history of colon cancer, missed repeat colonoscopy in October 2024; requets GI referral.   RN advising ED for vision changes and to address diarrhea, pt seems agreeable staying that he will likely go to the ED today.  Reason for Disposition  [1] MODERATE diarrhea (e.g., 4-6 times / day more than normal) AND [2] present > 48 hours (2 days)  [1] Blurred vision or visual changes AND [2] present now AND [3] sudden onset or new (e.g., minutes, hours, days)  (Exception: Seeing floaters / black specks OR previously diagnosed migraine headaches with same symptoms.)  Answer Assessment - Initial Assessment Questions 1. DIARRHEA SEVERITY: "How bad is the diarrhea?" "How many more stools have you had in the past 24 hours than normal?"    - NO DIARRHEA (SCALE 0)   - MILD (SCALE 1-3): Few loose or mushy BMs; increase of 1-3 stools over normal daily number of stools; mild increase in ostomy output.   -  MODERATE (SCALE 4-7): Increase of 4-6 stools daily over normal; moderate increase in ostomy output.   -  SEVERE (SCALE 8-10; OR "WORST POSSIBLE"): Increase of 7 or more stools daily over normal; moderate increase in ostomy  output; incontinence.     "A lot" moderate to severe  2. ONSET: "When did the diarrhea begin?"      1 week ago  3. BM CONSISTENCY: "How loose or watery is the diarrhea?"      Watery  4. VOMITING: "Are you also vomiting?" If Yes, ask: "How many times in the past 24 hours?"      No  5. ABDOMEN PAIN: "Are you having any abdomen pain?" If Yes, ask: "What does it feel like?" (e.g., crampy, dull, intermittent, constant)      Cramping, comes and goes  6. ABDOMEN PAIN SEVERITY: If present, ask: "How bad is the pain?"  (e.g., Scale 1-10; mild, moderate, or severe)   - MILD (1-3): doesn't interfere with normal activities, abdomen soft and not tender to touch    - MODERATE (4-7): interferes with normal activities or awakens from sleep, abdomen tender to touch    - SEVERE (8-10): excruciating pain, doubled over, unable to do any normal activities       Mild to moderate  7. ORAL INTAKE: If vomiting, "Have you been able to drink liquids?" "How much liquids have you had in the past 24 hours?"     Able to drink  8. HYDRATION: "Any signs of dehydration?" (e.g., dry mouth [not just dry lips], too weak to stand, dizziness, new weight loss) "When did you last urinate?"     Not feeling dehydrated  9. EXPOSURE: "Have you traveled to a foreign country recently?" "Have you been exposed to anyone with diarrhea?" "Could you have eaten any food that  was spoiled?"     No known exposure  10. ANTIBIOTIC USE: "Are you taking antibiotics now or have you taken antibiotics in the past 2 months?"       No  11. OTHER SYMPTOMS: "Do you have any other symptoms?" (e.g., fever, blood in stool)       Headache, blurry vision when reading  12. PREGNANCY: "Is there any chance you are pregnant?" "When was your last menstrual period?"       N/a  Answer Assessment - Initial Assessment Questions 1. DESCRIPTION: "How has your vision changed?" (e.g., complete vision loss, blurred vision, double vision, floaters, etc.)      Blurred vision  2. LOCATION: "One or both eyes?" If one, ask: "Which eye?"     Both eyes  3. SEVERITY: "Can you see anything?" If Yes, ask: "What can you see?" (e.g., fine print)     Can see clearly, but unable to read fine print  4. ONSET: "When did this begin?" "Did it start suddenly or has this been gradual?"     Started this morning, started suddenly  5. PATTERN: "Does this come and go, or has it been constant since it started?"     Come and go  6. PAIN: "Is there any pain in your eye(s)?"  (Scale 1-10; or mild, moderate, severe)   - NONE (0): No pain.   - MILD (1-3): Doesn't interfere with normal activities.   - MODERATE (4-7): Interferes with normal activities or awakens from sleep.    - SEVERE (8-10): Excruciating pain, unable to do any normal activities.     No pain  7. CONTACTS-GLASSES: "Do you wear contacts or glasses?"     Wears glasses  8. CAUSE: "What do you think is causing this visual problem?"     Concerned could be reaction Imodium  9. OTHER SYMPTOMS: "Do you have any other symptoms?" (e.g., confusion, headache, arm or leg weakness, speech problems)     Headache, diarrhea  10. PREGNANCY: "Is there any chance you are pregnant?" "When was your last menstrual period?"       .  Protocols used: Diarrhea-A-AH, Vision Loss or Change-A-AH

## 2023-06-11 ENCOUNTER — Other Ambulatory Visit: Payer: Self-pay

## 2023-06-11 ENCOUNTER — Emergency Department (HOSPITAL_COMMUNITY)
Admission: EM | Admit: 2023-06-11 | Discharge: 2023-06-11 | Disposition: A | Discharge status: Home / Self Care | Attending: Emergency Medicine | Admitting: Emergency Medicine

## 2023-06-11 ENCOUNTER — Telehealth: Payer: Self-pay

## 2023-06-11 DIAGNOSIS — Z85038 Personal history of other malignant neoplasm of large intestine: Secondary | ICD-10-CM | POA: Insufficient documentation

## 2023-06-11 DIAGNOSIS — K529 Noninfective gastroenteritis and colitis, unspecified: Secondary | ICD-10-CM

## 2023-06-11 DIAGNOSIS — Z7982 Long term (current) use of aspirin: Secondary | ICD-10-CM | POA: Diagnosis not present

## 2023-06-11 DIAGNOSIS — Z1211 Encounter for screening for malignant neoplasm of colon: Secondary | ICD-10-CM

## 2023-06-11 DIAGNOSIS — R1030 Lower abdominal pain, unspecified: Secondary | ICD-10-CM | POA: Diagnosis not present

## 2023-06-11 DIAGNOSIS — R197 Diarrhea, unspecified: Secondary | ICD-10-CM | POA: Diagnosis not present

## 2023-06-11 LAB — CBC WITH DIFFERENTIAL/PLATELET
Abs Immature Granulocytes: 0.01 10*3/uL (ref 0.00–0.07)
Basophils Absolute: 0.1 10*3/uL (ref 0.0–0.1)
Basophils Relative: 1 %
Eosinophils Absolute: 0.9 10*3/uL — ABNORMAL HIGH (ref 0.0–0.5)
Eosinophils Relative: 10 %
HCT: 45.9 % (ref 39.0–52.0)
Hemoglobin: 15.1 g/dL (ref 13.0–17.0)
Immature Granulocytes: 0 %
Lymphocytes Relative: 15 %
Lymphs Abs: 1.3 10*3/uL (ref 0.7–4.0)
MCH: 28.3 pg (ref 26.0–34.0)
MCHC: 32.9 g/dL (ref 30.0–36.0)
MCV: 86.1 fL (ref 80.0–100.0)
Monocytes Absolute: 0.7 10*3/uL (ref 0.1–1.0)
Monocytes Relative: 8 %
Neutro Abs: 6 10*3/uL (ref 1.7–7.7)
Neutrophils Relative %: 66 %
Platelets: 275 10*3/uL (ref 150–400)
RBC: 5.33 MIL/uL (ref 4.22–5.81)
RDW: 13.2 % (ref 11.5–15.5)
WBC: 9 10*3/uL (ref 4.0–10.5)
nRBC: 0 % (ref 0.0–0.2)

## 2023-06-11 LAB — COMPREHENSIVE METABOLIC PANEL WITH GFR
ALT: 26 U/L (ref 0–44)
AST: 30 U/L (ref 15–41)
Albumin: 3.5 g/dL (ref 3.5–5.0)
Alkaline Phosphatase: 104 U/L (ref 38–126)
Anion gap: 9 (ref 5–15)
BUN: 15 mg/dL (ref 8–23)
CO2: 23 mmol/L (ref 22–32)
Calcium: 9.1 mg/dL (ref 8.9–10.3)
Chloride: 104 mmol/L (ref 98–111)
Creatinine, Ser: 0.77 mg/dL (ref 0.61–1.24)
GFR, Estimated: >60 mL/min (ref >60)
Glucose, Bld: 100 mg/dL — ABNORMAL HIGH (ref 70–99)
Potassium: 4 mmol/L (ref 3.5–5.1)
Sodium: 136 mmol/L (ref 135–145)
Total Bilirubin: 0.6 mg/dL (ref 0.0–1.2)
Total Protein: 6.5 g/dL (ref 6.5–8.1)

## 2023-06-11 LAB — MAGNESIUM: Magnesium: 2 mg/dL (ref 1.7–2.4)

## 2023-06-11 MED ORDER — LACTATED RINGERS IV BOLUS
1000.0000 mL | Freq: Once | INTRAVENOUS | Status: AC
  Administered 2023-06-11: 1000 mL via INTRAVENOUS

## 2023-06-11 NOTE — Discharge Instructions (Signed)
 You were seen for your diarrhea in the emergency department.   At home, please try to collect a stool sample.  If you are able to please try to have it tested for C. difficile and GI pathogen panel.    Check your MyChart online for the results of any tests that had not resulted by the time you left the emergency department.   Follow-up with your primary doctor in 2-3 days regarding your visit.  Follow-up with GI in 1 week.  Return immediately to the emergency department if you experience any of the following: Severe abdominal pain, fainting, fevers, or any other concerning symptoms.    Thank you for visiting our Emergency Department. It was a pleasure taking care of you today.

## 2023-06-11 NOTE — Telephone Encounter (Signed)
 Thank you :)

## 2023-06-11 NOTE — Telephone Encounter (Signed)
 Most welcome

## 2023-06-11 NOTE — ED Provider Notes (Signed)
 [facility] EMERGENCY DEPARTMENT AT [facility] Provider Note   CSN: [id number] Arrival date & time: 06/11/23  1132     History  Chief Complaint  Patient presents with   Diarrhea    Albert Gonzalez is a 73 y.o. male.  73 year old male with a history of stage II colon cancer status post right-sided hemicolectomy (under surveillance) who presents emergency department diarrhea.  Patient reports over the past 2 weeks he has been having copious amounts of loose stools.  Over the past few days has had 4 loose stools per day.  No melena hematochezia.  Has lost 8 pounds in that time.  No nausea or vomiting.  Says he has been trying Imodium without relief.  Did have some lower abdominal discomfort recently but says that he is not currently having it.       Home Medications Prior to Admission medications   Medication Sig Start Date End Date Taking? Authorizing Provider  albuterol (VENTOLIN HFA) 108 (90 Base) MCG/ACT inhaler INHALE ONE PUFF INTO THE LUNGS FOUR TIMES DAILY AS NEEDED 10/11/22   Coralyn Helling, MD  aspirin 325 MG tablet Take 325 mg by mouth daily.    [provider]  atorvastatin (LIPITOR) 40 MG tablet TAKE ONE TABLET BY MOUTH EVERY NIGHT AT BEDTIME 05/10/23   [name], FNP  Calcium Citrate-Vitamin D (CALCIUM + D PO) Take 2 tablets by mouth daily.    [provider]  diclofenac (VOLTAREN) 75 MG EC tablet TAKE ONE TABLET (75MG  TOTAL) BY MOUTH TWO TIMES DAILY AS NEEDED 05/10/23   [name], FNP  DULoxetine (CYMBALTA) 60 MG capsule TAKE 1 (ONE) CAPSULE BY MOUTH TWO TIMES DAILY 03/12/23   [name], FNP  famotidine (PEPCID) 10 MG tablet Take 1 tablet (10 mg total) by mouth daily. 07/11/22   [name], FNP  fluticasone-salmeterol (ADVAIR) 250-50 MCG/ACT AEPB Inhale 1 puff into the lungs in the morning and at bedtime. 10/11/22   Coralyn Helling, MD  meloxicam (MOBIC) 7.5 MG tablet Take 1 tablet (7.5 mg total) by mouth daily. 09/19/22   [name], FNP  Misc Natural Products (GLUCOSAMINE CHOND DOUBLE STR PO) Take 2 tablets by mouth daily.    [provider]  Multiple Vitamin (MULTIVITAMIN) tablet Take 1 tablet by mouth daily. Men's 50+    [provider]  polyethylene glycol (MIRALAX / GLYCOLAX) 17 g packet Take 17 g by mouth daily as needed for mild constipation. Patient taking differently: Take 17 g by mouth daily. 09/10/20   Cleora Fleet, MD      Allergies    Sulfa antibiotics    Review of Systems   Review of Systems  Physical Exam Updated Vital Signs BP (!) 136/95 (BP Location: Left Arm)   Pulse 91   Temp 98 F (36.7 C) (Oral)   Resp 18   Ht 5\' 11"  (1.803 m)   Wt 77.6 kg   SpO2 96%   BMI 23.85 kg/m  Physical Exam Vitals and nursing note reviewed.  Constitutional:      General: He is not in acute distress.    Appearance: He is well-developed.  HENT:     Head: Normocephalic and atraumatic.     Right Ear: External ear normal.     Left Ear: External ear normal.     Nose: Nose normal.  Eyes:     Extraocular Movements: Extraocular movements intact.     Conjunctiva/sclera: Conjunctivae normal.     Pupils: Pupils are equal,  round, and reactive to light.  Pulmonary:     Effort: Pulmonary effort is normal. No respiratory distress.  Abdominal:     General: There is no distension.     Palpations: Abdomen is soft. There is no mass.     Tenderness: There is no abdominal tenderness. There is no guarding.  Musculoskeletal:     Cervical back: Normal range of motion and neck supple.  Skin:    General: Skin is warm and dry.  Neurological:     Mental Status: He is alert. Mental status is at baseline.  Psychiatric:        Mood and Affect: Mood normal.        Behavior: Behavior normal.     ED Results / Procedures / Treatments   Labs (all labs ordered are listed, but only abnormal results are displayed) Labs Reviewed  CBC WITH DIFFERENTIAL/PLATELET - Abnormal; Notable for the following  components:      Result Value   Eosinophils Absolute 0.9 (*)    All other components within normal limits  COMPREHENSIVE METABOLIC PANEL WITH GFR - Abnormal; Notable for the following components:   Glucose, Bld 100 (*)    All other components within normal limits  MAGNESIUM    EKG None  Radiology No results found.  Procedures Procedures    Medications Ordered in ED Medications  lactated ringers bolus 1,000 mL (0 mLs Intravenous Stopped 06/11/23 1541)    ED Course/ Medical Decision Making/ A&P                                 Medical Decision Making Amount and/or Complexity of Data Reviewed Labs: ordered.   Albert Gonzalez is a 73 y.o. male with comorbidities that complicate the patient evaluation including stage II colon cancer status post right-sided hemicolectomy (under surveillance) who presents emergency department diarrhea.   Initial Ddx:  Infectious diarrhea, colitis, medication side effect, electrolyte abnormality, AKI, dehydration  MDM/Course:  Patient presents to the emergency department with diarrhea for 2 weeks.  He is overall well-appearing.  No significant abdominal tenderness to palpation.  Does not peer to be clinically dehydrated.  Had lab work that was unremarkable.  Upon re-evaluation was feeling much better after the IV fluids.  Attempted to give a stool sample but was unable to.  Was discharged home with a stool sample cup in case he is able to give a sample for C. difficile and/for GI pathogen panel that can be sent as an outpatient or upon return visit to the ED.  Will have him follow-up with his primary doctor and GI.  This patient presents to the ED for concern of complaints listed in HPI, this involves an extensive number of treatment options, and is a complaint that carries with it a high risk of complications and morbidity. Disposition including potential need for admission considered.   Dispo: DC Home. Return precautions discussed including, but  not limited to, those listed in the AVS. Allowed pt time to ask questions which were answered fully prior to dc.  Records reviewed Outpatient Clinic Notes The following labs were independently interpreted: Chemistry and show no acute abnormality I have reviewed the patients home medications and made adjustments as needed Social Determinants of health:  Geriatric  Portions of this note were generated with Scientist, clinical (histocompatibility and immunogenetics). Dictation errors may occur despite best attempts at proofreading.     Final Clinical Impression(s) / ED Diagnoses  Final diagnoses:  Diarrhea, unspecified type  History of colon cancer    Rx / DC Orders ED Discharge Orders          Ordered    Ambulatory referral to Gastroenterology        06/11/23 1523              Ninetta Basket, MD 06/11/23 1635

## 2023-06-11 NOTE — Telephone Encounter (Signed)
 Pt. Came in office claiming that he has had diarrhea for the past 10 days. He visited the ED. He has taken imodium but it is not helping .he is experiencing dehydration. ED doctor has requested that orders for Gi pathogen and Cdiff stool lab work could be ordered . Ma submitted order. Pt .has supplies and will bring sample to office for lab testing when it is completed.   Ma re entered order for colonoscopy as original has expired. Pt. Requested order to be sent to Dr . Gomez Lathe w/ [facility].

## 2023-06-11 NOTE — ED Triage Notes (Signed)
 Pt complains of diarrhea x10 days. Pt states he has a history of colon cancer and part of colon removed approximately 2 years ago. Pt states he hasn't had a recent colonoscopy.

## 2023-06-27 ENCOUNTER — Other Ambulatory Visit: Payer: Self-pay | Admitting: Family Medicine

## 2023-07-18 ENCOUNTER — Encounter: Payer: Self-pay | Admitting: Podiatry

## 2023-07-18 ENCOUNTER — Ambulatory Visit: Admitting: Podiatry

## 2023-07-18 VITALS — Ht 71.0 in | Wt 171.0 lb

## 2023-07-18 DIAGNOSIS — B070 Plantar wart: Secondary | ICD-10-CM | POA: Diagnosis not present

## 2023-07-19 NOTE — Progress Notes (Signed)
 Subjective:   Patient ID: Albert Gonzalez, male   DOB: 73 y.o.   MRN: [id number]   HPI Patient states has developed a lot of pain in the bottom of his right foot and states he does not remember stepping on anything.  Patient does not smoke likes to be active   Review of Systems  All other systems reviewed and are negative.       Objective:  Physical Exam Vitals and nursing note reviewed.  Constitutional:      Appearance: He is well-developed.  Pulmonary:     Effort: Pulmonary effort is normal.  Musculoskeletal:        General: Normal range of motion.  Skin:    General: Skin is warm.  Neurological:     Mental Status: He is alert.     Neurovascular status intact muscle strength found to be adequate range of motion adequate with patient noted to have exquisite discomfort in the plantar aspect of the right foot which he has a lesion which is just showed up in the last few weeks and is painful to lateral pressure with no drainage erythema edema noted.     Assessment:  Probability for verruca plantaris cannot rule out porokeratosis foreign body or other corn callus formation     Plan:  H&P all conditions reviewed with patient and at this point I went ahead and using sterile sharp instrumentation I debrided the area applied chemical agent to create immune response with sterile dressing gave instructions on soaks padding and patient will be seen back as symptoms indicate

## 2023-07-31 ENCOUNTER — Other Ambulatory Visit: Payer: Medicare HMO

## 2023-07-31 ENCOUNTER — Ambulatory Visit

## 2023-07-31 ENCOUNTER — Encounter: Payer: Self-pay | Admitting: Vascular Surgery

## 2023-07-31 ENCOUNTER — Ambulatory Visit: Payer: Medicare HMO | Admitting: Vascular Surgery

## 2023-07-31 ENCOUNTER — Ambulatory Visit: Admitting: Vascular Surgery

## 2023-07-31 VITALS — BP 122/76 | HR 78 | Ht 71.0 in | Wt 171.0 lb

## 2023-07-31 DIAGNOSIS — I714 Abdominal aortic aneurysm, without rupture, unspecified: Secondary | ICD-10-CM | POA: Diagnosis not present

## 2023-07-31 DIAGNOSIS — I7143 Infrarenal abdominal aortic aneurysm, without rupture: Secondary | ICD-10-CM | POA: Diagnosis not present

## 2023-07-31 NOTE — Progress Notes (Signed)
 Patient name: Albert Gonzalez MRN: [id number] DOB: 1950-04-20 Sex: male  REASON FOR VISIT: 6 month follow-up for AAA surveillance   HPI: Albert Gonzalez is a 73 y.o. male with history COPD, colon cancer, hyperlipidemia the presents for follow-up of his AAA.  Last seen by myself on 01/03/2023 with a 4.8 cm AAA by CT.  No new abdominal or back pain today.  States his legs are aching him and hurt all the time.  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  Past Medical History:  Diagnosis Date   Anxiety    Arthritis    Cancer (HCC)    COPD (chronic obstructive pulmonary disease) (HCC)    Depression    History of kidney stones    Hyperlipidemia    Inguinal hernia right    Insomnia    Myalgia    PPD positive, treated 1979   1 year of INH   Raynaud phenomenon    Restless leg syndrome    Sarcoidosis 1980    Past Surgical History:  Procedure Laterality Date   AXILLARY LYMPH NODE BIOPSY Right 1980   BIOPSY  09/28/2021   Procedure: BIOPSY;  Surgeon: Suzette Espy, MD;  Location: [facility];  Service: Endoscopy;;   COLON SURGERY     COLONOSCOPY  02/08/2011   Procedure: COLONOSCOPY;  Surgeon: Suzette Espy, MD;  Location: [facility];  Service: Endoscopy;  Laterality: N/A;  1:30 PM   COLONOSCOPY WITH PROPOFOL  N/A 09/28/2021   Procedure: COLONOSCOPY WITH PROPOFOL ;  Surgeon: Suzette Espy, MD;  Location: [facility];  Service: Endoscopy;  Laterality: N/A;  2:15pm   INGUINAL HERNIA REPAIR Right 01/29/2017   Procedure: HERNIA REPAIR INGUINAL ADULT WITH MESH;  Surgeon: Awilda Bogus, MD;  Location: [facility];  Service: General;  Laterality: Right;   KNEE ARTHROSCOPY WITH MEDIAL MENISECTOMY Right 04/05/2016   Procedure: RIGHT KNEE ARTHROSCOPY WITH PARTIAL  LATERAL MENISECTOMY, DEBRIDEMENT MEDIAL MENISCUS, ACL DEBRIDEMENT, MICRO FRACTURE OF FEMUR;  Surgeon: Darrin Emerald, MD;  Location: [facility];  Service: Orthopedics;  Laterality: Right;   KNEE SURGERY     left    KYPHOPLASTY     LAPAROSCOPIC PARTIAL COLECTOMY N/A 11/02/2021   Procedure: LAPAROSCOPIC EXTENDED RIGHT HEMICOLECTOMY;  Surgeon: Awilda Bogus, MD;  Location: [facility];  Service: General;  Laterality: N/A;   POLYPECTOMY  09/28/2021   Procedure: POLYPECTOMY;  Surgeon: Suzette Espy, MD;  [facility]: [facility];  Service: Endoscopy;;   TONSILLECTOMY     TOTAL KNEE ARTHROPLASTY Right 02/08/2021   Procedure: TOTAL KNEE ARTHROPLASTY;  Surgeon: Darrin Emerald, MD;  Location: [facility];  Service: Orthopedics;  Laterality: Right;    Family History  Problem Relation Age of Onset   Suicidality Father    Colon cancer Neg Hx    Liver disease Neg Hx    Inflammatory bowel disease Neg Hx    Anesthesia problems Neg Hx     SOCIAL HISTORY: Social History   Tobacco Use   Smoking status: Former    Current packs/day: 0.00    Average packs/day: 0.5 packs/day for 20.0 years (10.0 ttl pk-yrs)    Types: Pipe, Cigarettes    Start date: 04/03/1970    Quit date: 04/03/1990    Years since quitting: 33.3   Smokeless tobacco: Never   Tobacco comments:    quit many years ago  Substance Use Topics   Alcohol use: Not Currently    Comment: occassional  Allergies  Allergen Reactions   Sulfa Antibiotics Rash    Childhood     Current Outpatient Medications  Medication Sig Dispense Refill   albuterol  (VENTOLIN  HFA) 108 (90 Base) MCG/ACT inhaler INHALE ONE PUFF INTO THE LUNGS FOUR TIMES DAILY AS NEEDED 17 each 1   aspirin  325 MG tablet Take 325 mg by mouth daily.     atorvastatin  (LIPITOR) 40 MG tablet TAKE ONE TABLET BY MOUTH EVERY NIGHT AT BEDTIME 90 tablet 0   Calcium  Citrate-Vitamin D  (CALCIUM  + D PO) Take 2 tablets by mouth daily.     diclofenac  (VOLTAREN ) 75 MG EC tablet TAKE ONE TABLET (75MG  TOTAL) BY MOUTH TWO TIMES DAILY AS NEEDED 60 tablet 0   DULoxetine  (CYMBALTA ) 60 MG capsule TAKE 1 (ONE) CAPSULE BY MOUTH TWO TIMES DAILY 180 capsule 1   fluticasone -salmeterol (ADVAIR) 250-50 MCG/ACT  AEPB Inhale 1 puff into the lungs in the morning and at bedtime. 120 each 2   Misc Natural Products (GLUCOSAMINE CHOND DOUBLE STR PO) Take 2 tablets by mouth daily.     Multiple Vitamin (MULTIVITAMIN) tablet Take 1 tablet by mouth daily. Men's 50+     polyethylene glycol (MIRALAX  / GLYCOLAX ) 17 g packet Take 17 g by mouth daily as needed for mild constipation. (Patient taking differently: Take 17 g by mouth daily.) 14 each 0   No current facility-administered medications for this visit.    REVIEW OF SYSTEMS:  [X]  denotes positive finding, [ ]  denotes negative finding Cardiac  Comments:  Chest pain or chest pressure:    Shortness of breath upon exertion:    Short of breath when lying flat:    Irregular heart rhythm:        Vascular    Pain in calf, thigh, or hip brought on by ambulation:    Pain in feet at night that wakes you up from your sleep:     Blood clot in your veins:    Leg swelling:         Pulmonary    Oxygen  at home:    Productive cough:     Wheezing:         Neurologic    Sudden weakness in arms or legs:     Sudden numbness in arms or legs:     Sudden onset of difficulty speaking or slurred speech:    Temporary loss of vision in one eye:     Problems with dizziness:         Gastrointestinal    Blood in stool:     Vomited blood:         Genitourinary    Burning when urinating:     Blood in urine:        Psychiatric    Major depression:         Hematologic    Bleeding problems:    Problems with blood clotting too easily:        Skin    Rashes or ulcers:        Constitutional    Fever or chills:      PHYSICAL EXAM: Vitals:   07/31/23 1042 07/31/23 1047 07/31/23 1048  BP: (!) 83/39 132/73 122/76  Pulse: 78    SpO2: 94%    Weight: 171 lb (77.6 kg)    Height: 5\' 11"  (1.803 m)      GENERAL: The patient is a well-nourished male, in no acute distress. The vital signs are documented above. CARDIAC: There is a regular  rate and rhythm.  VASCULAR:   Bilateral femoral pulses palpable Bilateral DP pulses palpable PULMONARY: No respiratory distress. ABDOMEN: Soft and non-tender. MUSCULOSKELETAL: There are no major deformities or cyanosis. NEUROLOGIC: No focal weakness or paresthesias are detected. SKIN: There are no ulcers or rashes noted. PSYCHIATRIC: The patient has a normal affect.  DATA:   US  today with AAA diameter 5.05 cm  CT abdomen pelvis reviewed from 01/03/2023 with 4.8 cm AAA  Assessment/Plan:  73 y.o. male with history COPD, colon cancer, hyperlipidemia the presents for follow-up of his AAA.  Last seen by myself on 01/03/2023 with a 4.8 cm AAA by CT.  Discussed his ultrasound today shows some minimal growth in his aneurysm now measuring about 5 cm.  Discussed no indication for surgical intervention at this time.  I discussed in men we repair these greater than 5.5 cm.  I will see him in 6 months with repeat AAA duplex.  I did discuss trying to stop statin therapy for a brief period to see if this helps his myalgias that he describes and he has follow-up with his PCP next week.   Young Hensen, MD [facility] Office: [phone]

## 2023-08-01 ENCOUNTER — Encounter (HOSPITAL_COMMUNITY): Payer: Self-pay

## 2023-08-02 ENCOUNTER — Inpatient Hospital Stay: Attending: Hematology

## 2023-08-02 ENCOUNTER — Ambulatory Visit (HOSPITAL_COMMUNITY)
Admission: RE | Admit: 2023-08-02 | Discharge: 2023-08-02 | Disposition: A | Discharge status: Home / Self Care | Source: Ambulatory Visit | Attending: Hematology | Admitting: Hematology

## 2023-08-02 DIAGNOSIS — M79605 Pain in left leg: Secondary | ICD-10-CM | POA: Insufficient documentation

## 2023-08-02 DIAGNOSIS — Z85038 Personal history of other malignant neoplasm of large intestine: Secondary | ICD-10-CM | POA: Diagnosis not present

## 2023-08-02 DIAGNOSIS — M79604 Pain in right leg: Secondary | ICD-10-CM | POA: Diagnosis not present

## 2023-08-02 DIAGNOSIS — N281 Cyst of kidney, acquired: Secondary | ICD-10-CM | POA: Diagnosis not present

## 2023-08-02 DIAGNOSIS — I7143 Infrarenal abdominal aortic aneurysm, without rupture: Secondary | ICD-10-CM | POA: Diagnosis not present

## 2023-08-02 DIAGNOSIS — C189 Malignant neoplasm of colon, unspecified: Secondary | ICD-10-CM | POA: Diagnosis not present

## 2023-08-02 DIAGNOSIS — C184 Malignant neoplasm of transverse colon: Secondary | ICD-10-CM | POA: Insufficient documentation

## 2023-08-02 DIAGNOSIS — D508 Other iron deficiency anemias: Secondary | ICD-10-CM

## 2023-08-02 DIAGNOSIS — K648 Other hemorrhoids: Secondary | ICD-10-CM | POA: Diagnosis not present

## 2023-08-02 DIAGNOSIS — D649 Anemia, unspecified: Secondary | ICD-10-CM | POA: Diagnosis not present

## 2023-08-02 DIAGNOSIS — Z08 Encounter for follow-up examination after completed treatment for malignant neoplasm: Secondary | ICD-10-CM | POA: Insufficient documentation

## 2023-08-02 LAB — CBC WITH DIFFERENTIAL/PLATELET
Abs Immature Granulocytes: 0.02 10*3/uL (ref 0.00–0.07)
Basophils Absolute: 0.1 10*3/uL (ref 0.0–0.1)
Basophils Relative: 1 %
Eosinophils Absolute: 0.9 10*3/uL — ABNORMAL HIGH (ref 0.0–0.5)
Eosinophils Relative: 11 %
HCT: 46.7 % (ref 39.0–52.0)
Hemoglobin: 15.5 g/dL (ref 13.0–17.0)
Immature Granulocytes: 0 %
Lymphocytes Relative: 18 %
Lymphs Abs: 1.5 10*3/uL (ref 0.7–4.0)
MCH: 29.3 pg (ref 26.0–34.0)
MCHC: 33.2 g/dL (ref 30.0–36.0)
MCV: 88.3 fL (ref 80.0–100.0)
Monocytes Absolute: 0.8 10*3/uL (ref 0.1–1.0)
Monocytes Relative: 9 %
Neutro Abs: 5.3 10*3/uL (ref 1.7–7.7)
Neutrophils Relative %: 61 %
Platelets: 299 10*3/uL (ref 150–400)
RBC: 5.29 MIL/uL (ref 4.22–5.81)
RDW: 13.5 % (ref 11.5–15.5)
WBC: 8.6 10*3/uL (ref 4.0–10.5)
nRBC: 0 % (ref 0.0–0.2)

## 2023-08-02 LAB — IRON AND TIBC
Iron: 66 ug/dL (ref 45–182)
Saturation Ratios: 18 % (ref 17.9–39.5)
TIBC: 371 ug/dL (ref 250–450)
UIBC: 305 ug/dL

## 2023-08-02 LAB — COMPREHENSIVE METABOLIC PANEL WITH GFR
ALT: 23 U/L (ref 0–44)
AST: 25 U/L (ref 15–41)
Albumin: 3.9 g/dL (ref 3.5–5.0)
Alkaline Phosphatase: 108 U/L (ref 38–126)
Anion gap: 5 (ref 5–15)
BUN: 17 mg/dL (ref 8–23)
CO2: 24 mmol/L (ref 22–32)
Calcium: 9.1 mg/dL (ref 8.9–10.3)
Chloride: 105 mmol/L (ref 98–111)
Creatinine, Ser: 0.78 mg/dL (ref 0.61–1.24)
GFR, Estimated: >60 mL/min (ref >60)
Glucose, Bld: 80 mg/dL (ref 70–99)
Potassium: 4.3 mmol/L (ref 3.5–5.1)
Sodium: 134 mmol/L — ABNORMAL LOW (ref 135–145)
Total Bilirubin: 0.7 mg/dL (ref 0.0–1.2)
Total Protein: 6.9 g/dL (ref 6.5–8.1)

## 2023-08-02 LAB — FERRITIN: Ferritin: 46 ng/mL (ref 24–336)

## 2023-08-02 MED ORDER — IOHEXOL 300 MG/ML  SOLN
100.0000 mL | Freq: Once | INTRAMUSCULAR | Status: AC | PRN
  Administered 2023-08-02: 100 mL via INTRAVENOUS

## 2023-08-02 MED ORDER — IOHEXOL 9 MG/ML PO SOLN
ORAL | Status: AC
  Filled 2023-08-02: qty 1000

## 2023-08-03 LAB — CEA: CEA: 3.7 ng/mL (ref 0.0–4.7)

## 2023-08-09 ENCOUNTER — Inpatient Hospital Stay: Admitting: Hematology

## 2023-08-09 VITALS — BP 117/82 | HR 88 | Temp 97.8°F | Resp 16 | Wt 174.8 lb

## 2023-08-09 DIAGNOSIS — Z08 Encounter for follow-up examination after completed treatment for malignant neoplasm: Secondary | ICD-10-CM | POA: Diagnosis not present

## 2023-08-09 DIAGNOSIS — C184 Malignant neoplasm of transverse colon: Secondary | ICD-10-CM | POA: Diagnosis not present

## 2023-08-09 DIAGNOSIS — M79605 Pain in left leg: Secondary | ICD-10-CM | POA: Diagnosis not present

## 2023-08-09 DIAGNOSIS — M79604 Pain in right leg: Secondary | ICD-10-CM | POA: Diagnosis not present

## 2023-08-09 DIAGNOSIS — D508 Other iron deficiency anemias: Secondary | ICD-10-CM | POA: Diagnosis not present

## 2023-08-09 DIAGNOSIS — D649 Anemia, unspecified: Secondary | ICD-10-CM | POA: Diagnosis not present

## 2023-08-09 DIAGNOSIS — K648 Other hemorrhoids: Secondary | ICD-10-CM | POA: Diagnosis not present

## 2023-08-09 DIAGNOSIS — Z85038 Personal history of other malignant neoplasm of large intestine: Secondary | ICD-10-CM | POA: Diagnosis not present

## 2023-08-09 DIAGNOSIS — I7143 Infrarenal abdominal aortic aneurysm, without rupture: Secondary | ICD-10-CM | POA: Diagnosis not present

## 2023-08-09 NOTE — Progress Notes (Signed)
 [facility] [address], [place] [address]    Clinic Day:  08/09/23   Referring physician: Zarwolo, Gloria, FNP  Patient Care Team: Zarwolo, Gloria, FNP as PCP - General (Family Medicine) Riley Cheadle Windsor Hatcher, MD (Gastroenterology) Paulett Boros, MD as Medical Oncologist (Medical Oncology) Gerhard Knuckles, RN as Oncology Nurse Navigator (Medical Oncology)   ASSESSMENT & PLAN:   Assessment: 1.  Stage II (PT3 N0) ascending colon adenocarcinoma: - Colonoscopy (09/28/2021): Exophytic apple core appearing mass in the proximal colon.  Location in relation to the hepatic flexure or cecum could not be ascertained with certainty.  Nonbleeding internal hemorrhoids. - CEA on 09/28/2021-2.8 - CT AP (10/12/2021): Apple core lesion in the hepatic flexure with no evidence of metastatic disease in the abdomen or pelvis. - Laparoscopic right hemicolectomy by Dr. Collene Dawson on 11/02/2021 - Pathology: Moderately differentiated adenocarcinoma extending into pericolonic connective tissue, margins negative, 0/24 lymph nodes involved, negative LVI and PNI, MMR preserved. - Personal history of sarcoidosis from a left axillary lymph node biopsy prior to 2000   2.  Social/family history: - He lives at home with his wife.  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  He smoked a pipe briefly for less than a year but was never cigarette smoker.  Maternal uncle had esophageal cancer.    Plan: 1.  Stage II (PT3N0) ascending colon adenocarcinoma: - Denies any change in bowel habits/bleeding per rectum/melena. - Reviewed labs from 08/02/2023: Normal LFTs.  CBC was normal.  CEA was normal at 3.7. - Reviewed CTAP from 08/02/2023: No evidence of malignancy.  Other findings were discussed. - Recommend follow-up in 6 months with CTAP and labs including CEA.  After next visit, we will switch imaging to once a year and follow-ups to once every 6 months with CEA and  LFTs. - We will refer him back to Dr. Riley Cheadle for colonoscopy.  He is due for colonoscopy 1 year after his diagnosis, but he put it off as his wife was sick.   2.  Normocytic anemia: - He stopped taking iron pills 6 months ago.  Latest ferritin is 46, slightly improved from 38 previously.  Hemoglobin is normal.  Will repeat in 6 months.    Orders Placed This Encounter  Procedures   CT ABDOMEN PELVIS W CONTRAST    Standing Status:   Future    Expected Date:   02/08/2024    Expiration Date:   08/08/2024    If indicated for the ordered procedure, I authorize the administration of contrast media per Radiology protocol:   Yes    Does the patient have a contrast media/X-ray dye allergy?:   No    Preferred imaging location?:   [facility]    If indicated for the ordered procedure, I authorize the administration of oral contrast media per Radiology protocol:   Yes   CBC with Differential    Standing Status:   Future    Expected Date:   02/04/2024    Expiration Date:   05/04/2024   Comprehensive metabolic panel    Standing Status:   Future    Expected Date:   02/04/2024    Expiration Date:   05/04/2024   CEA    Standing Status:   Future    Expected Date:   02/04/2024    Expiration Date:   05/04/2024   Iron and TIBC ([facility] ONLY)    Standing Status:  Future    Expected Date:   02/04/2024    Expiration Date:   05/04/2024   Ferritin    Standing Status:   Future    Expected Date:   02/04/2024    Expiration Date:   05/04/2024      Hurman Maiden R Teague,acting as a scribe for Paulett Boros, MD.,have documented all relevant documentation on the behalf of Paulett Boros, MD,as directed by  Paulett Boros, MD while in the presence of Paulett Boros, MD.  I, Paulett Boros MD, have reviewed the above documentation for accuracy and completeness, and I agree with the above.     Paulett Boros, MD   6/12/20253:59 PM  CHIEF COMPLAINT:   Diagnosis:  stage II right colon cancer    Cancer Staging  No matching staging information was found for the patient.    Prior Therapy: Right hemicolectomy on 11/02/2021   Current Therapy:  Surveillance    HISTORY OF PRESENT ILLNESS:   Oncology History   No history exists.     INTERVAL HISTORY:   Albert Gonzalez is a 73 y.o. male presenting to clinic today for follow up of stage II right colon cancer. He was last seen by me on 05/03/23.  Since his last visit, he underwent CT AP on 08/02/23 that found: Right hemicolectomy with no evidence for malignancy within the abdomen or pelvis. 5.2 cm infrarenal abdominal aortic aneurysm.  Today, he states that he is doing well overall. His appetite level is at 100%. His energy level is at 75%. Wynton notes normal BM's. He reports bilateral leg pain and states he discontinued Lipitor in the last 2 weeks, however, the pain persists. His vascular specialist reportedly told his he had normal perfusion to the area. He has follow-up in 6 months.   He is no longer taking iron supplements.   Albert Gonzalez has not had repeat colonoscopy since his diagnosis and would like a referral to GI.   PAST MEDICAL HISTORY:   Past Medical History: Past Medical History:  Diagnosis Date   Anxiety    Arthritis    Cancer (HCC)    COPD (chronic obstructive pulmonary disease) (HCC)    Depression    History of kidney stones    Hyperlipidemia    Inguinal hernia right    Insomnia    Myalgia    PPD positive, treated 1979   1 year of INH   Raynaud phenomenon    Restless leg syndrome    Sarcoidosis 1980    Surgical History: Past Surgical History:  Procedure Laterality Date   AXILLARY LYMPH NODE BIOPSY Right 1980   BIOPSY  09/28/2021   Procedure: BIOPSY;  Surgeon: Suzette Espy, MD;  [facility];  Service: Endoscopy;;   COLON SURGERY     COLONOSCOPY  02/08/2011   Procedure: COLONOSCOPY;  Surgeon: Suzette Espy, MD;  [facility];  Service: Endoscopy;   Laterality: N/A;  1:30 PM   COLONOSCOPY WITH PROPOFOL  N/A 09/28/2021   Procedure: COLONOSCOPY WITH PROPOFOL ;  Surgeon: Suzette Espy, MD;  [facility];  Service: Endoscopy;  Laterality: N/A;  2:15pm   INGUINAL HERNIA REPAIR Right 01/29/2017   Procedure: HERNIA REPAIR INGUINAL ADULT WITH MESH;  Surgeon: Awilda Bogus, MD;  Location: [facility];  Service: General;  Laterality: Right;   KNEE ARTHROSCOPY WITH MEDIAL MENISECTOMY Right 04/05/2016   Procedure: RIGHT KNEE ARTHROSCOPY WITH PARTIAL  LATERAL MENISECTOMY, DEBRIDEMENT MEDIAL MENISCUS, ACL DEBRIDEMENT, MICRO FRACTURE OF FEMUR;  Surgeon: Veatrice Georgis  Phyllis Breeze, MD;  Location: [facility];  Service: Orthopedics;  Laterality: Right;   KNEE SURGERY     left   KYPHOPLASTY     LAPAROSCOPIC PARTIAL COLECTOMY N/A 11/02/2021   Procedure: LAPAROSCOPIC EXTENDED RIGHT HEMICOLECTOMY;  Surgeon: Awilda Bogus, MD;  Location: [facility];  Service: General;  Laterality: N/A;   POLYPECTOMY  09/28/2021   Procedure: POLYPECTOMY;  Surgeon: Suzette Espy, MD;  [facility];  Service: Endoscopy;;   TONSILLECTOMY     TOTAL KNEE ARTHROPLASTY Right 02/08/2021   Procedure: TOTAL KNEE ARTHROPLASTY;  Surgeon: Darrin Emerald, MD;  Location: [facility];  Service: Orthopedics;  Laterality: Right;    Social History: Social History   Socioeconomic History   Marital status: Married    Spouse name: Not on file   Number of children: 1   Years of education: Not on file   Highest education level: Not on file  Occupational History   Occupation: respiratory therapist    Employer: [employer]  Tobacco Use   Smoking status: Former    Current packs/day: 0.00    Average packs/day: 0.5 packs/day for 20.0 years (10.0 ttl pk-yrs)    Types: Pipe, Cigarettes    Start date: 04/03/1970    Quit date: 04/03/1990    Years since quitting: 33.3   Smokeless tobacco: Never   Tobacco comments:    quit many years ago  Vaping Use   Vaping status:  Never Used  Substance and Sexual Activity   Alcohol use: Not Currently    Comment: occassional   Drug use: No   Sexual activity: Yes  Other Topics Concern   Not on file  Social History Narrative   Not on file   Social Drivers of Health   Financial Resource Strain: Not on file  Food Insecurity: No Food Insecurity (11/02/2021)   Hunger Vital Sign    Worried About Running Out of Food in the Last Year: Never true    Ran Out of Food in the Last Year: Never true  Transportation Needs: No Transportation Needs (11/02/2021)   PRAPARE - Administrator, Civil Service (Medical): No    Lack of Transportation (Non-Medical): No  Physical Activity: Not on file  Stress: Not on file  Social Connections: Not on file  Intimate Partner Violence: Not At Risk (11/02/2021)   Humiliation, Afraid, Rape, and Kick questionnaire    Fear of Current or Ex-Partner: No    Emotionally Abused: No    Physically Abused: No    Sexually Abused: No    Family History: Family History  Problem Relation Age of Onset   Suicidality Father    Colon cancer Neg Hx    Liver disease Neg Hx    Inflammatory bowel disease Neg Hx    Anesthesia problems Neg Hx     Current Medications:  Current Outpatient Medications:    albuterol  (VENTOLIN  HFA) 108 (90 Base) MCG/ACT inhaler, INHALE ONE PUFF INTO THE LUNGS FOUR TIMES DAILY AS NEEDED, Disp: 17 each, Rfl: 1   aspirin  325 MG tablet, Take 325 mg by mouth daily., Disp: , Rfl:    Calcium  Citrate-Vitamin D  (CALCIUM  + D PO), Take 2 tablets by mouth daily., Disp: , Rfl:    diclofenac  (VOLTAREN ) 75 MG EC tablet, TAKE ONE TABLET (75MG  TOTAL) BY MOUTH TWO TIMES DAILY AS NEEDED, Disp: 60 tablet, Rfl: 0   DULoxetine  (CYMBALTA ) 60 MG capsule, TAKE 1 (ONE) CAPSULE BY MOUTH TWO TIMES DAILY, Disp: 180  capsule, Rfl: 1   fluticasone -salmeterol (ADVAIR) 250-50 MCG/ACT AEPB, Inhale 1 puff into the lungs in the morning and at bedtime., Disp: 120 each, Rfl: 2   Misc Natural Products  (GLUCOSAMINE CHOND DOUBLE STR PO), Take 2 tablets by mouth daily., Disp: , Rfl:    Multiple Vitamin (MULTIVITAMIN) tablet, Take 1 tablet by mouth daily. Men's 50+, Disp: , Rfl:    polyethylene glycol (MIRALAX  / GLYCOLAX ) 17 g packet, Take 17 g by mouth daily as needed for mild constipation. (Patient taking differently: Take 17 g by mouth daily.), Disp: 14 each, Rfl: 0   atorvastatin  (LIPITOR) 40 MG tablet, TAKE ONE TABLET BY MOUTH EVERY NIGHT AT BEDTIME (Patient not taking: Reported on 08/09/2023), Disp: 90 tablet, Rfl: 0   Allergies: Allergies  Allergen Reactions   Sulfa Antibiotics Rash    Childhood     REVIEW OF SYSTEMS:   Review of Systems  Constitutional:  Negative for chills, fatigue and fever.  HENT:   Negative for lump/mass, mouth sores, nosebleeds, sore throat and trouble swallowing.   Eyes:  Negative for eye problems.  Respiratory:  Negative for cough and shortness of breath.   Cardiovascular:  Negative for chest pain, leg swelling and palpitations.  Gastrointestinal:  Negative for abdominal pain, constipation, diarrhea, nausea and vomiting.  Genitourinary:  Negative for bladder incontinence, difficulty urinating, dysuria, frequency, hematuria and nocturia.   Musculoskeletal:  Negative for arthralgias, back pain, flank pain, myalgias and neck pain.       +right leg pain, 6/10 severity  Skin:  Negative for itching and rash.  Neurological:  Negative for dizziness, headaches and numbness.  Hematological:  Does not bruise/bleed easily.  Psychiatric/Behavioral:  Negative for depression, sleep disturbance and suicidal ideas. The patient is not nervous/anxious.   All other systems reviewed and are negative.    VITALS:   Blood pressure 117/82, pulse 88, temperature 97.8 F (36.6 C), temperature source Oral, resp. rate 16, weight 174 lb 12.8 oz (79.3 kg), SpO2 96%.  Wt Readings from Last 3 Encounters:  08/09/23 174 lb 12.8 oz (79.3 kg)  07/31/23 171 lb (77.6 kg)  07/18/23 171 lb  (77.6 kg)    Body mass index is 24.38 kg/m.  Performance status (ECOG): 1 - Symptomatic but completely ambulatory  PHYSICAL EXAM:   Physical Exam Vitals and nursing note reviewed. Exam conducted with a chaperone present.  Constitutional:      Appearance: Normal appearance.   Cardiovascular:     Rate and Rhythm: Normal rate and regular rhythm.     Pulses: Normal pulses.     Heart sounds: Normal heart sounds.  Pulmonary:     Effort: Pulmonary effort is normal.     Breath sounds: Normal breath sounds.  Abdominal:     Palpations: Abdomen is soft. There is no hepatomegaly, splenomegaly or mass.     Tenderness: There is no abdominal tenderness.   Musculoskeletal:     Right lower leg: No edema.     Left lower leg: No edema.  Lymphadenopathy:     Cervical: No cervical adenopathy.     Right cervical: No superficial, deep or posterior cervical adenopathy.    Left cervical: No superficial, deep or posterior cervical adenopathy.     Upper Body:     Right upper body: No supraclavicular or axillary adenopathy.     Left upper body: No supraclavicular or axillary adenopathy.   Neurological:     General: No focal deficit present.     Mental Status: He  is alert and oriented to person, place, and time.   Psychiatric:        Mood and Affect: Mood normal.        Behavior: Behavior normal.     LABS:      Latest Ref Rng & Units 08/02/2023    2:36 PM 06/11/2023    1:00 PM 04/12/2023    3:17 PM  CBC  WBC 4.0 - 10.5 K/uL 8.6  9.0  8.8   Hemoglobin 13.0 - 17.0 g/dL 16.1  09.6  04.5   Hematocrit 39.0 - 52.0 % 46.7  45.9  49.4   Platelets 150 - 400 K/uL 299  275  298       Latest Ref Rng & Units 08/02/2023    2:36 PM 06/11/2023    1:00 PM 04/12/2023    3:17 PM  CMP  Glucose 70 - 99 mg/dL 80  409  98   BUN 8 - 23 mg/dL 17  15  20    Creatinine 0.61 - 1.24 mg/dL 8.11  9.14  7.82   Sodium 135 - 145 mmol/L 134  136  138   Potassium 3.5 - 5.1 mmol/L 4.3  4.0  4.4   Chloride 98 - 111 mmol/L  105  104  102   CO2 22 - 32 mmol/L 24  23  26    Calcium  8.9 - 10.3 mg/dL 9.1  9.1  9.8   Total Protein 6.5 - 8.1 g/dL 6.9  6.5  7.7   Total Bilirubin 0.0 - 1.2 mg/dL 0.7  0.6  0.6   Alkaline Phos 38 - 126 U/L 108  104  102   AST 15 - 41 U/L 25  30  25    ALT 0 - 44 U/L 23  26  24       Lab Results  Component Value Date   CEA1 3.7 08/02/2023   /  CEA  Date Value Ref Range Status  08/02/2023 3.7 0.0 - 4.7 ng/mL Final    Comment:    (NOTE)                             Nonsmokers          <3.9                             Smokers             <5.6 Roche [employer]ostics Electrochemiluminescence Immunoassay (ECLIA) Values obtained with different assay methods or kits cannot be used interchangeably.  Results cannot be interpreted as absolute evidence of the presence or absence of malignant disease. Performed At: [facility] [address] [place], [place] [id number] Pearlean Botts MD VH:[id number]    No results found for: PSA1 No results found for: CAN199 No results found for: CAN125  No results found for: [place] Aliment, MSPIKE, SPEI Lab Results  Component Value Date   TIBC 371 08/02/2023   TIBC 415 04/12/2023   TIBC 391 01/03/2023   FERRITIN 46 08/02/2023   FERRITIN 38 04/12/2023   FERRITIN 47 01/03/2023   IRONPCTSAT 18 08/02/2023   IRONPCTSAT 17 (L) 04/12/2023   IRONPCTSAT 17 (L) 01/03/2023   No results found for: LDH   STUDIES:   CT ABDOMEN PELVIS W CONTRAST Result Date: 08/03/2023 EXAMINATION: CT ABDOMEN PELVIS W CONTRAST CLINICAL INDICATION: Male, 73 years old. Colon cancer, stage II/III, monitor  TECHNIQUE: Axial CT of the abdomen and pelvis with 100 cc Omnipaque  300 intravenous contrast. Multiplanar reformations provided. Unless otherwise specified, incidental thyroid , adrenal, renal lesions do not require dedicated imaging follow up. Additionally, any mentioned pulmonary nodules do not require  dedicated imaging follow-up based on the Fleischner guidelines unless otherwise specified. Coronary calcifications are not identified unless otherwise specified. COMPARISON: 01/03/2023 FINDINGS: There are fibrotic changes within the lung bases. The heart is normal in size. The liver appears normal. The gallbladder is normal. The spleen is normal. Pancreas is normal. The adrenals are normal. The left kidney is normal. Small right renal cyst is seen. 5.2 cm infrarenal abdominal aortic aneurysm. Scattered atherosclerotic changes are present. The bladder is normal. The prostate is normal. The patient is status post right hemicolectomy. Large and small bowel loops are otherwise within normal limits. No free fluid or adenopathy. No concerning osseous lesions. There are old compression fractures at L1 and L4 with previous L1 vertebral augmentation. IMPRESSION: Right hemicolectomy with no evidence for malignancy within the abdomen or pelvis. 5.2 cm infrarenal abdominal aortic aneurysm. Recommend follow-up CT in 6 months and referral to a vascular specialist. DOSE REDUCTION: This exam was performed according to our departmental dose-optimization program which includes automated exposure control, adjustment of the mA and/or kV according to patient size and/or use of iterative reconstruction technique. Electronically signed by: Italy Engel MD 08/03/2023 05:39 AM EDT RP Workstation: [id number]   VAS US  AAA DUPLEX Result Date: 07/31/2023 ABDOMINAL AORTA STUDY Patient Name:  Albert Gonzalez  Date of Exam:   07/31/2023 Medical Rec #: [id number]       Accession #:    [id number] Date of Birth: Sep 12, 1950       Patient Gender: M Patient Age:   62 years Exam Location:  [address] Procedure:      VAS US  AAA DUPLEX Referring Phys: Jimmye Moulds --------------------------------------------------------------------------------  Indications: Follow up exam for known AAA.  Comparison Study: CT 10/12/21: AAA 4.5 cm                    06/21/22: 4.4 x 4.94 cm Performing Technologist: Helon Lobos RVT  Examination Guidelines: A complete evaluation includes B-mode imaging, spectral Doppler, color Doppler, and power Doppler as needed of all accessible portions of each vessel. Bilateral testing is considered an integral part of a complete examination. Limited examinations for reoccurring indications may be performed as noted.  Abdominal Aorta Findings: +-----------+-------+----------+----------+---------+--------+--------+ Location   AP (cm)Trans (cm)PSV (cm/s)Waveform ThrombusComments +-----------+-------+----------+----------+---------+--------+--------+ Proximal   2.23   2.38      89                                  +-----------+-------+----------+----------+---------+--------+--------+ Mid        5.05   4.99      93                                  +-----------+-------+----------+----------+---------+--------+--------+ Distal     2.39   0.53      71                                  +-----------+-------+----------+----------+---------+--------+--------+ RT CIA Prox1.1    1.1       90        triphasic                 +-----------+-------+----------+----------+---------+--------+--------+  LT CIA Prox1.1    1.1       90        triphasic                 +-----------+-------+----------+----------+---------+--------+--------+  Summary: Abdominal Aorta: There is evidence of abnormal dilatation of the mid Abdominal aorta. Previous diameter measurement was obtained on 06/21/22: 4.4 x 4.94 cm.  *See table(s) above for measurements and observations.  Electronically signed by Jimmye Moulds MD on 07/31/2023 at 11:01:17 AM.    Final

## 2023-08-09 NOTE — Patient Instructions (Signed)
 [facility] at [facility] Discharge Instructions   You were seen and examined today by Dr. Ellin Saba.  He reviewed the results of your lab work which are normal/stable.   He reviewed the results of your CT scan which did not show any evidence of cancer.   We will see you back in 6 months. We will repeat lab work and a CT scan prior to this visit.    Return as scheduled.    Thank you for choosing [facility] at [facility] to provide your oncology and hematology care.  To afford each patient quality time with our provider, please arrive at least 15 minutes before your scheduled appointment time.   If you have a lab appointment with the Cancer Center please come in thru the Main Entrance and check in at the main information desk.  You need to re-schedule your appointment should you arrive 10 or more minutes late.  We strive to give you quality time with our providers, and arriving late affects you and other patients whose appointments are after yours.  Also, if you no show three or more times for appointments you may be dismissed from the clinic at the providers discretion.     Again, thank you for choosing [facility].  Our hope is that these requests will decrease the amount of time that you wait before being seen by our physicians.       _____________________________________________________________  Should you have questions after your visit to [facility], please contact our office at [phone] and follow the prompts.  Our office hours are 8:00 a.m. and 4:30 p.m. Monday - Friday.  Please note that voicemails left after 4:00 p.m. may not be returned until the following business day.  We are closed weekends and major holidays.  You do have access to a nurse 24-7, just call the main number to the clinic [phone] and do not press any options, hold on the line and a nurse will answer the phone.    For prescription  refill requests, have your pharmacy contact our office and allow 72 hours.    Due to Covid, you will need to wear a mask upon entering the hospital. If you do not have a mask, a mask will be given to you at the Main Entrance upon arrival. For doctor visits, patients may have 1 support person age 47 or older with them. For treatment visits, patients can not have anyone with them due to social distancing guidelines and our immunocompromised population.

## 2023-08-23 ENCOUNTER — Other Ambulatory Visit: Payer: Self-pay | Admitting: Family Medicine

## 2023-08-27 ENCOUNTER — Encounter: Payer: Self-pay | Admitting: Internal Medicine

## 2023-08-30 ENCOUNTER — Ambulatory Visit (INDEPENDENT_AMBULATORY_CARE_PROVIDER_SITE_OTHER): Payer: Self-pay | Admitting: Family Medicine

## 2023-08-30 ENCOUNTER — Ambulatory Visit (HOSPITAL_COMMUNITY)
Admission: RE | Admit: 2023-08-30 | Discharge: 2023-08-30 | Disposition: A | Discharge status: Home / Self Care | Source: Ambulatory Visit | Attending: Family Medicine | Admitting: Family Medicine

## 2023-08-30 VITALS — BP 140/65 | HR 88 | Resp 16 | Ht 71.0 in | Wt 174.0 lb

## 2023-08-30 DIAGNOSIS — M25551 Pain in right hip: Secondary | ICD-10-CM | POA: Diagnosis not present

## 2023-08-30 DIAGNOSIS — E559 Vitamin D deficiency, unspecified: Secondary | ICD-10-CM | POA: Diagnosis not present

## 2023-08-30 DIAGNOSIS — E038 Other specified hypothyroidism: Secondary | ICD-10-CM | POA: Diagnosis not present

## 2023-08-30 DIAGNOSIS — Z1211 Encounter for screening for malignant neoplasm of colon: Secondary | ICD-10-CM | POA: Diagnosis not present

## 2023-08-30 DIAGNOSIS — E7849 Other hyperlipidemia: Secondary | ICD-10-CM | POA: Diagnosis not present

## 2023-08-30 DIAGNOSIS — M47816 Spondylosis without myelopathy or radiculopathy, lumbar region: Secondary | ICD-10-CM | POA: Diagnosis not present

## 2023-08-30 DIAGNOSIS — J4520 Mild intermittent asthma, uncomplicated: Secondary | ICD-10-CM | POA: Diagnosis not present

## 2023-08-30 DIAGNOSIS — Z0001 Encounter for general adult medical examination with abnormal findings: Secondary | ICD-10-CM

## 2023-08-30 DIAGNOSIS — R7301 Impaired fasting glucose: Secondary | ICD-10-CM

## 2023-08-30 DIAGNOSIS — M1611 Unilateral primary osteoarthritis, right hip: Secondary | ICD-10-CM | POA: Diagnosis not present

## 2023-08-30 MED ORDER — VITAMIN B-6 50 MG PO TABS: 50.0000 mg | ORAL_TABLET | Freq: Every day | ORAL | 1 refills | Status: AC

## 2023-08-30 MED ORDER — PREDNISONE 20 MG PO TABS: 40.0000 mg | ORAL_TABLET | Freq: Every day | ORAL | 0 refills | Status: AC

## 2023-08-30 MED ORDER — ALBUTEROL SULFATE HFA 108 (90 BASE) MCG/ACT IN AERS: INHALATION_SPRAY | RESPIRATORY_TRACT | 1 refills | Status: DC

## 2023-08-30 MED ORDER — CYCLOBENZAPRINE HCL 5 MG PO TABS: 5.0000 mg | ORAL_TABLET | Freq: Three times a day (TID) | ORAL | 1 refills | Status: DC | PRN

## 2023-08-30 NOTE — Progress Notes (Signed)
 Complete physical exam  Patient: Albert Gonzalez   DOB: [birth year]   73 y.o. Male  MRN: [id number]  Subjective:    Chief Complaint  Patient presents with   Annual Exam   Hip Pain    Has been having right hip pain and it can get hard to walk. Feels some numbness in his legs also. Stopped the atorvavstatin but still having the pain in his legs     Albert Gonzalez is a 73 y.o. male who presents today for a complete physical exam. He reports consuming a general diet. The patient does not participate in regular exercise at present. He generally feels well. He reports sleeping well. He does have additional problems to discuss today.    Most recent fall risk assessment:    08/30/2023    3:15 PM  Fall Risk   Falls in the past year? 0  Number falls in past yr: 0  Injury with Fall? 0  Follow up Falls evaluation completed     Most recent depression screenings:    03/31/2022    2:22 PM 01/23/2022    1:09 PM  PHQ 2/9 Scores  PHQ - 2 Score 2 0  PHQ- 9 Score 5 2    Dental: No current dental problems and Last dental visit: jan 2025  Patient Active Problem List   Diagnosis Date Noted   Pain of right hip 08/30/2023   Chronic left shoulder pain 07/11/2022   Thumb pain, left 04/01/2022   Encounter for general adult medical examination with abnormal findings 01/23/2022   S/P right hemicolectomy 11/15/2021   Need for immunization against influenza 11/15/2021   Colon cancer (HCC) 11/02/2021   Aortic atherosclerosis (HCC) 10/26/2021   Neoplasm of hepatic flexure of colon 10/17/2021   Abdominal aortic aneurysm (AAA) 3.0 cm to 5.5 cm in diameter in male (HCC) 09/21/2021   Snoring 07/04/2021   Asthma 07/04/2021   Bilateral low back pain 06/23/2021   S/P total knee replacement, right 02/08/21 02/22/2021   Osteoarthritis of right knee 02/08/2021   Sepsis due to pneumonia (HCC) 09/09/2020   Depression    Anxiety    Hyperlipidemia    Arthritis    Sarcoidosis    Right inguinal hernia     MDD (major depressive disorder), recurrent episode, moderate (HCC) 08/24/2016   Acute medial meniscus tear of left knee    Tear of lateral meniscus of right knee, current    Primary osteoarthritis of right knee    Deficiency of anterior cruciate ligament of right knee    Abnormal CT scan, colon 02/01/2011   Constipation 02/01/2011   Past Medical History:  Diagnosis Date   Anxiety    Arthritis    Cancer (HCC)    COPD (chronic obstructive pulmonary disease) (HCC)    Depression    History of kidney stones    Hyperlipidemia    Inguinal hernia right    Insomnia    Myalgia    PPD positive, treated 1979   1 year of INH   Raynaud phenomenon    Restless leg syndrome    Sarcoidosis 1980   Past Surgical History:  Procedure Laterality Date   AXILLARY LYMPH NODE BIOPSY Right 1980   BIOPSY  09/28/2021   Procedure: BIOPSY;  Surgeon: Shaaron Lamar HERO, MD;  [facility];  Service: Endoscopy;;   COLON SURGERY     COLONOSCOPY  02/08/2011   Procedure: COLONOSCOPY;  Surgeon: Lamar HERO Shaaron, MD;  [facility];  Service: Endoscopy;  Laterality: N/A;  1:30 PM   COLONOSCOPY WITH PROPOFOL  N/A 09/28/2021   Procedure: COLONOSCOPY WITH PROPOFOL ;  Surgeon: Shaaron Lamar HERO, MD;  [facility];  Service: Endoscopy;  Laterality: N/A;  2:15pm   INGUINAL HERNIA REPAIR Right 01/29/2017   Procedure: HERNIA REPAIR INGUINAL ADULT WITH MESH;  Surgeon: Kallie Manuelita BROCKS, MD;  Location: [facility];  Service: General;  Laterality: Right;   KNEE ARTHROSCOPY WITH MEDIAL MENISECTOMY Right 04/05/2016   Procedure: RIGHT KNEE ARTHROSCOPY WITH PARTIAL  LATERAL MENISECTOMY, DEBRIDEMENT MEDIAL MENISCUS, ACL DEBRIDEMENT, MICRO FRACTURE OF FEMUR;  Surgeon: Taft FORBES Minerva, MD;  Location: [facility];  Service: Orthopedics;  Laterality: Right;   KNEE SURGERY     left   KYPHOPLASTY     LAPAROSCOPIC PARTIAL COLECTOMY N/A 11/02/2021   Procedure: LAPAROSCOPIC EXTENDED RIGHT HEMICOLECTOMY;  Surgeon:  Kallie Manuelita BROCKS, MD;  Location: [facility];  Service: General;  Laterality: N/A;   POLYPECTOMY  09/28/2021   Procedure: POLYPECTOMY;  Surgeon: Shaaron Lamar HERO, MD;  [facility];  Service: Endoscopy;;   TONSILLECTOMY     TOTAL KNEE ARTHROPLASTY Right 02/08/2021   Procedure: TOTAL KNEE ARTHROPLASTY;  Surgeon: Minerva Taft FORBES, MD;  Location: [facility];  Service: Orthopedics;  Laterality: Right;   Social History   Tobacco Use   Smoking status: Former    Current packs/day: 0.00    Average packs/day: 0.5 packs/day for 20.0 years (10.0 ttl pk-yrs)    Types: Pipe, Cigarettes    Start date: 04/03/1970    Quit date: 04/03/1990    Years since quitting: 33.4   Smokeless tobacco: Never   Tobacco comments:    quit many years ago  Vaping Use   Vaping status: Never Used  Substance Use Topics   Alcohol use: Not Currently    Comment: occassional   Drug use: No   Social History   Socioeconomic History   Marital status: Married    Spouse name: Not on file   Number of children: 1   Years of education: Not on file   Highest education level: Associate degree: academic program  Occupational History   Occupation: respiratory therapist    Employer: [employer]  Tobacco Use   Smoking status: Former    Current packs/day: 0.00    Average packs/day: 0.5 packs/day for 20.0 years (10.0 ttl pk-yrs)    Types: Pipe, Cigarettes    Start date: 04/03/1970    Quit date: 04/03/1990    Years since quitting: 33.4   Smokeless tobacco: Never   Tobacco comments:    quit many years ago  Vaping Use   Vaping status: Never Used  Substance and Sexual Activity   Alcohol use: Not Currently    Comment: occassional   Drug use: No   Sexual activity: Yes  Other Topics Concern   Not on file  Social History Narrative   Not on file   Social Drivers of Health   Financial Resource Strain: Low Risk  (08/29/2023)   Overall Financial Resource Strain (CARDIA)    Difficulty of Paying Living  Expenses: Not very hard  Food Insecurity: No Food Insecurity (08/29/2023)   Hunger Vital Sign    Worried About Running Out of Food in the Last Year: Never true    Ran Out of Food in the Last Year: Never true  Transportation Needs: No Transportation Needs (08/29/2023)   PRAPARE - Transportation    Lack of Transportation (Medical): No    Lack  of Transportation (Non-Medical): No  Physical Activity: Inactive (08/29/2023)   Exercise Vital Sign    Days of Exercise per Week: 0 days    Minutes of Exercise per Session: Not on file  Stress: No Stress Concern Present (08/29/2023)   [employer] of Occupational Health - Occupational Stress Questionnaire    Feeling of Stress: Not at all  Social Connections: Moderately Integrated (08/29/2023)   Social Connection and Isolation Panel    Frequency of Communication with Friends and Family: More than three times a week    Frequency of Social Gatherings with Friends and Family: Patient declined    Attends Religious Services: Patient declined    Active Member of Clubs or Organizations: Yes    Attends Banker Meetings: Patient declined    Marital Status: Married  Catering manager Violence: Not At Risk (11/02/2021)   Humiliation, Afraid, Rape, and Kick questionnaire    Fear of Current or Ex-Partner: No    Emotionally Abused: No    Physically Abused: No    Sexually Abused: No   Family Status  Relation Name Status   Father  (Not Specified)   Neg Hx  (Not Specified)  No partnership data on file   Family History  Problem Relation Age of Onset   Suicidality Father    Colon cancer Neg Hx    Liver disease Neg Hx    Inflammatory bowel disease Neg Hx    Anesthesia problems Neg Hx    Allergies  Allergen Reactions   Sulfa Antibiotics Rash    Childhood       Patient Care Team: Jerson Furukawa, FNP as PCP - General (Family Medicine) Shaaron, Lamar HERO, MD (Gastroenterology) Rogers Hai, MD as Medical Oncologist (Medical  Oncology) Celestia Joesph SQUIBB, RN as Oncology Nurse Navigator (Medical Oncology)   Outpatient Medications Prior to Visit  Medication Sig   aspirin  325 MG tablet Take 325 mg by mouth daily.   Calcium  Citrate-Vitamin D  (CALCIUM  + D PO) Take 2 tablets by mouth daily.   diclofenac  (VOLTAREN ) 75 MG EC tablet TAKE ONE TABLET (75MG  TOTAL) BY MOUTH TWO TIMES DAILY AS NEEDED   DULoxetine  (CYMBALTA ) 60 MG capsule TAKE 1 (ONE) CAPSULE BY MOUTH TWO TIMES DAILY   fluticasone -salmeterol (ADVAIR) 250-50 MCG/ACT AEPB Inhale 1 puff into the lungs in the morning and at bedtime.   Multiple Vitamin (MULTIVITAMIN) tablet Take 1 tablet by mouth daily. Men's 50+   polyethylene glycol (MIRALAX  / GLYCOLAX ) 17 g packet Take 17 g by mouth daily as needed for mild constipation.   atorvastatin  (LIPITOR) 40 MG tablet TAKE ONE TABLET BY MOUTH EVERY NIGHT AT BEDTIME (Patient not taking: Reported on 08/30/2023)   Misc Natural Products (GLUCOSAMINE CHOND DOUBLE STR PO) Take 2 tablets by mouth daily.   [DISCONTINUED] albuterol  (VENTOLIN  HFA) 108 (90 Base) MCG/ACT inhaler INHALE ONE PUFF INTO THE LUNGS FOUR TIMES DAILY AS NEEDED   No facility-administered medications prior to visit.    Review of Systems  Constitutional:  Negative for chills, fever and malaise/fatigue.  HENT:  Negative for congestion and sinus pain.   Eyes:  Negative for pain, discharge and redness.  Respiratory:  Negative for cough, sputum production and shortness of breath.   Cardiovascular:  Negative for chest pain, palpitations, claudication and leg swelling.  Gastrointestinal:  Negative for diarrhea, heartburn and nausea.  Genitourinary:  Negative for flank pain and frequency.  Musculoskeletal:  Negative for back pain and joint pain.       Right hip pain  Skin:  Negative for itching.  Neurological:  Negative for dizziness, seizures and headaches.  Endo/Heme/Allergies:  Negative for environmental allergies.  Psychiatric/Behavioral:  Negative for memory  loss. The patient does not have insomnia.        Objective:    BP (!) 140/65   Pulse 88   Resp 16   Ht 5' 11 (1.803 m)   Wt 174 lb (78.9 kg)   SpO2 98%   BMI 24.27 kg/m  BP Readings from Last 3 Encounters:  08/30/23 (!) 140/65  08/09/23 117/82  07/31/23 122/76   Wt Readings from Last 3 Encounters:  08/30/23 174 lb (78.9 kg)  08/09/23 174 lb 12.8 oz (79.3 kg)  07/31/23 171 lb (77.6 kg)      Physical Exam HENT:     Head: Normocephalic.     Right Ear: External ear normal.     Left Ear: External ear normal.     Nose: No congestion.     Mouth/Throat:     Mouth: Mucous membranes are moist.  Eyes:     Extraocular Movements: Extraocular movements intact.     Pupils: Pupils are equal, round, and reactive to light.  Cardiovascular:     Rate and Rhythm: Regular rhythm. Bradycardia present.     Heart sounds: No murmur heard. Pulmonary:     Effort: No respiratory distress.     Breath sounds: Normal breath sounds.  Abdominal:     Tenderness: There is no right CVA tenderness or left CVA tenderness.  Musculoskeletal:     Right lower leg: No edema.     Left lower leg: No edema.  Neurological:     Mental Status: He is alert and oriented to person, place, and time.     GCS: GCS eye subscore is 4. GCS verbal subscore is 5. GCS motor subscore is 6.     Cranial Nerves: No facial asymmetry.     Motor: No atrophy.     Coordination: Coordination normal. Finger-Nose-Finger Test normal.     Gait: Gait normal.  Psychiatric:        Judgment: Judgment normal.     No results found for any visits on 08/30/23. Last CBC Lab Results  Component Value Date   WBC 8.6 08/02/2023   HGB 15.5 08/02/2023   HCT 46.7 08/02/2023   MCV 88.3 08/02/2023   MCH 29.3 08/02/2023   RDW 13.5 08/02/2023   PLT 299 08/02/2023   Last metabolic panel Lab Results  Component Value Date   GLUCOSE 80 08/02/2023   NA 134 (L) 08/02/2023   K 4.3 08/02/2023   CL 105 08/02/2023   CO2 24 08/02/2023   BUN  17 08/02/2023   CREATININE 0.78 08/02/2023   GFRNONAA >60 08/02/2023   CALCIUM  9.1 08/02/2023   PHOS 2.7 11/05/2021   PROT 6.9 08/02/2023   ALBUMIN 3.9 08/02/2023   LABGLOB 2.1 07/12/2022   AGRATIO 2.1 07/12/2022   BILITOT 0.7 08/02/2023   ALKPHOS 108 08/02/2023   AST 25 08/02/2023   ALT 23 08/02/2023   ANIONGAP 5 08/02/2023   Last lipids Lab Results  Component Value Date   CHOL 171 07/12/2022   HDL 45 07/12/2022   LDLCALC 94 07/12/2022   TRIG 187 (H) 07/12/2022   CHOLHDL 3.8 07/12/2022   Last hemoglobin A1c Lab Results  Component Value Date   HGBA1C 5.5 07/12/2022   Last thyroid  functions Lab Results  Component Value Date   TSH 1.990 07/12/2022   Last vitamin D  Lab Results  Component Value Date   VD25OH 35.9 07/12/2022   Last vitamin B12 and Folate Lab Results  Component Value Date   VITAMINB12 741 01/03/2023   FOLATE >40.0 01/03/2023        Assessment & Plan:    Routine Health Maintenance and Physical Exam  Immunization History  Administered Date(s) Administered   Fluad Quad(high Dose 65+) 11/14/2021   Moderna Sars-Covid-2 Vaccination 05/30/2019, 07/02/2019   Pneumococcal Polysaccharide-23 06/22/2021   Tdap 06/22/2021   Zoster Recombinant(Shingrix) 11/03/2019, 01/05/2020    Health Maintenance  Topic Date Due   COVID-19 Vaccine (3 - Moderna risk series) 07/30/2019   Pneumococcal Vaccine: 50+ Years (2 of 2 - PCV) 06/23/2022   Medicare Annual Wellness (AWV)  08/23/2022   INFLUENZA VACCINE  09/28/2023   DTaP/Tdap/Td (2 - Td or Tdap) 06/23/2031   Colonoscopy  09/29/2031   Hepatitis C Screening  Completed   Zoster Vaccines- Shingrix  Completed   Hepatitis B Vaccines  Aged Out   HPV VACCINES  Aged Out   Meningococcal B Vaccine  Aged Out    Discussed health benefits of physical activity, and encouraged him to engage in regular exercise appropriate for his age and condition.  Encounter for general adult medical examination with abnormal  findings Assessment & Plan: Physical exam as documented Discussed heart-healthy diet  Encouraged to Exercise: If you are able: 30 -60 minutes a day ,4 days a week, or 150 minutes a week. The longer the better. Combine stretch, strength, and aerobic activities Encourage to eat whole Food, Plant Predominant Nutrition is highly recommended: Eat Plenty of vegetables, Mushrooms, fruits, Legumes, Whole Grains, Nuts, seeds in lieu of processed meats, processed snacks/pastries red meat, poultry, eggs.  Will f/u in 1 year for CPE      Pain of right hip Assessment & Plan: The patient is encouraged to stop by [facility] for a right hip X-ray to evaluate for arthritis, joint degeneration, or spinal involvement. Start Prednisone 40 mg daily for 5 days. Avoid ibuprofen or other NSAIDs while taking prednisone, as this combination increases the risk of gastrointestinal bleeding or ulcers. Use Tylenol  (acetaminophen ) instead for additional pain relief, as needed. A prescription for Flexeril  5 mg has been sent to the pharmacy to be taken up to three times daily as needed for muscle spasms related to hip pain. Note: Flexeril  may cause drowsiness; if excessive daytime drowsiness occurs, it should be taken at bedtime. A prescription for pyridoxine (Vitamin B6) 50 mg daily has also been sent to the pharmacy to help with numbness and tingling. The patient may take up to 100 mg daily as needed.  Nonpharmacologic Measures: -Apply warm or cold compresses to the hip for symptom relief. -Engage in gentle stretching and range-of-motion exercises as tolerated. -Avoid prolonged standing or walking if it worsens symptoms.  Follow-Up: -Encouraged to follow up if his symptoms worsen or fail to improve.  Orders: -     DG HIP UNILAT W OR W/O PELVIS 2-3 VIEWS RIGHT -     Cyclobenzaprine  HCl; Take 1 tablet (5 mg total) by mouth 3 (three) times daily as needed for muscle spasms.  Dispense: 30 tablet; Refill: 1 -      Vitamin B-6; Take 1 tablet (50 mg total) by mouth daily.  Dispense: 30 tablet; Refill: 1 -     predniSONE; Take 2 tablets (40 mg total) by mouth daily for 5 days.  Dispense: 10 tablet; Refill: 0  Mild intermittent asthma without complication -  Albuterol  Sulfate HFA; INHALE ONE PUFF INTO THE LUNGS FOUR TIMES DAILY AS NEEDED  Dispense: 17 each; Refill: 1  IFG (impaired fasting glucose) -     Hemoglobin A1c  Vitamin D  deficiency -     VITAMIN D  25 Hydroxy (Vit-D Deficiency, Fractures)  TSH (thyroid -stimulating hormone deficiency) -     TSH + free T4  Other hyperlipidemia -     Lipid panel -     CMP14+EGFR -     CBC with Differential/Platelet  Colon cancer screening -     Ambulatory referral to Gastroenterology  Note: This chart has been completed using Dragon Medical Dictation software, and while attempts have been made to ensure accuracy, certain words and phrases may not be transcribed as intended.    Return in about 1 year (around 08/29/2024) for CPE.     Aaronjames Kelsay, FNP

## 2023-08-30 NOTE — Assessment & Plan Note (Signed)

## 2023-08-30 NOTE — Assessment & Plan Note (Signed)
 The patient is encouraged to stop by [facility] for a right hip X-ray to evaluate for arthritis, joint degeneration, or spinal involvement. Start Prednisone 40 mg daily for 5 days. Avoid ibuprofen or other NSAIDs while taking prednisone, as this combination increases the risk of gastrointestinal bleeding or ulcers. Use Tylenol  (acetaminophen ) instead for additional pain relief, as needed. A prescription for Flexeril  5 mg has been sent to the pharmacy to be taken up to three times daily as needed for muscle spasms related to hip pain. Note: Flexeril  may cause drowsiness; if excessive daytime drowsiness occurs, it should be taken at bedtime. A prescription for pyridoxine (Vitamin B6) 50 mg daily has also been sent to the pharmacy to help with numbness and tingling. The patient may take up to 100 mg daily as needed.  Nonpharmacologic Measures: -Apply warm or cold compresses to the hip for symptom relief. -Engage in gentle stretching and range-of-motion exercises as tolerated. -Avoid prolonged standing or walking if it worsens symptoms.  Follow-Up: -Encouraged to follow up if his symptoms worsen or fail to improve.

## 2023-08-30 NOTE — Patient Instructions (Addendum)
 I appreciate the opportunity to provide care to you today!    Follow up:  1 year for CPE  Labs: please stop by the lab during the week to get your blood drawn (CBC, CMP, TSH, Lipid profile, HgA1c, Vit D)  Right Hip Pain: -Please stop by [facility] for a right hip X-ray to evaluate for arthritis, joint degeneration, or spinal involvement.  Medications: -Start Prednisone 40 mg daily for 5 days. -Avoid ibuprofen or other NSAIDs while taking prednisone, as it increases the risk of GI bleeding or ulcers. -You may take Tylenol  (acetaminophen ) instead for additional pain relief. -A prescription for Flexeril  5 mg has been sent to your pharmacy to be taken up to three times daily as needed for muscle spasms related to right hip pain. -Flexeril  may cause drowsiness. If daytime drowsiness occurs, take it at bedtime instead. -A prescription for pyridoxine (Vitamin B6) 50 mg daily has also been sent to your pharmacy to help with numbness and tingling. You may take up to 100 mg daily as needed.  Nonpharmacologic Measures: -Apply warm or cold compresses to the hip for symptom relief. -Engage in gentle stretching and range-of-motion exercises as tolerated. -Avoid prolonged standing or walking if it worsens symptoms.  Follow-Up: -Please follow up if your symptoms worsen or fail to improve.  Please schedule an appointment with her ophthalmologist as soon as possible  Referrals today-[facility] for colonoscopy  Attached with your AVS, you will find valuable resources for self-education. I highly recommend dedicating some time to thoroughly examine them.   Please continue to a heart-healthy diet and increase your physical activities. Try to exercise for at least five days a week.    It was a pleasure to see you and I look forward to continuing to work together on your health and well-being. Please do not hesitate to call the office if you need care or have questions about your care.  In  case of emergency, please visit the Emergency Department for urgent care, or contact our clinic at [phone] to schedule an appointment. We're here to help you!   Have a wonderful day and week. With Gratitude, Deserea Bordley MSN, FNP-BC

## 2023-08-31 ENCOUNTER — Ambulatory Visit: Payer: Self-pay | Admitting: Family Medicine

## 2023-08-31 DIAGNOSIS — E559 Vitamin D deficiency, unspecified: Secondary | ICD-10-CM

## 2023-08-31 NOTE — Progress Notes (Signed)
 Please inform the patient that the X-ray of his right hip shows advanced osteoarthritis, with bone-on-bone articulation.  This means that the cartilage in the hip joint has significantly worn down, causing the bones to rub directly against each other, which can lead to pain, stiffness, and reduced mobility. I recommend follow-up orthopedic surgery as discussed.

## 2023-09-03 ENCOUNTER — Encounter (INDEPENDENT_AMBULATORY_CARE_PROVIDER_SITE_OTHER): Payer: Self-pay | Admitting: *Deleted

## 2023-09-03 DIAGNOSIS — E559 Vitamin D deficiency, unspecified: Secondary | ICD-10-CM | POA: Diagnosis not present

## 2023-09-03 DIAGNOSIS — E038 Other specified hypothyroidism: Secondary | ICD-10-CM | POA: Diagnosis not present

## 2023-09-03 DIAGNOSIS — R7301 Impaired fasting glucose: Secondary | ICD-10-CM | POA: Diagnosis not present

## 2023-09-03 DIAGNOSIS — E7849 Other hyperlipidemia: Secondary | ICD-10-CM | POA: Diagnosis not present

## 2023-09-04 LAB — CMP14+EGFR
ALT: 26 IU/L (ref 0–44)
AST: 20 IU/L (ref 0–40)
Albumin: 4.3 g/dL (ref 3.8–4.8)
Alkaline Phosphatase: 116 IU/L (ref 44–121)
BUN/Creatinine Ratio: 18 (ref 10–24)
BUN: 19 mg/dL (ref 8–27)
Bilirubin Total: 0.5 mg/dL (ref 0.0–1.2)
CO2: 23 mmol/L (ref 20–29)
Calcium: 9.8 mg/dL (ref 8.6–10.2)
Chloride: 100 mmol/L (ref 96–106)
Creatinine, Ser: 1.08 mg/dL (ref 0.76–1.27)
Globulin, Total: 2.6 g/dL (ref 1.5–4.5)
Glucose: 83 mg/dL (ref 70–99)
Potassium: 4.4 mmol/L (ref 3.5–5.2)
Sodium: 139 mmol/L (ref 134–144)
Total Protein: 6.9 g/dL (ref 6.0–8.5)
eGFR: 72 mL/min/1.73 (ref >59)

## 2023-09-04 LAB — LIPID PANEL
Chol/HDL Ratio: 5.8 ratio — ABNORMAL HIGH (ref 0.0–5.0)
Cholesterol, Total: 255 mg/dL — ABNORMAL HIGH (ref 100–199)
HDL: 44 mg/dL (ref >39)
LDL Chol Calc (NIH): 154 mg/dL — ABNORMAL HIGH (ref 0–99)
Triglycerides: 304 mg/dL — ABNORMAL HIGH (ref 0–149)
VLDL Cholesterol Cal: 57 mg/dL — ABNORMAL HIGH (ref 5–40)

## 2023-09-04 LAB — CBC WITH DIFFERENTIAL/PLATELET
Basophils Absolute: 0.1 x10E3/uL (ref 0.0–0.2)
Basos: 1 %
EOS (ABSOLUTE): 0.3 x10E3/uL (ref 0.0–0.4)
Eos: 2 %
Hematocrit: 50.1 % (ref 37.5–51.0)
Hemoglobin: 16.1 g/dL (ref 13.0–17.7)
Immature Grans (Abs): 0 x10E3/uL (ref 0.0–0.1)
Immature Granulocytes: 0 %
Lymphocytes Absolute: 2.6 x10E3/uL (ref 0.7–3.1)
Lymphs: 20 %
MCH: 28.7 pg (ref 26.6–33.0)
MCHC: 32.1 g/dL (ref 31.5–35.7)
MCV: 89 fL (ref 79–97)
Monocytes Absolute: 0.8 x10E3/uL (ref 0.1–0.9)
Monocytes: 7 %
Neutrophils Absolute: 9 x10E3/uL — ABNORMAL HIGH (ref 1.4–7.0)
Neutrophils: 70 %
Platelets: 341 x10E3/uL (ref 150–450)
RBC: 5.61 x10E6/uL (ref 4.14–5.80)
RDW: 13.3 % (ref 11.6–15.4)
WBC: 12.9 x10E3/uL — ABNORMAL HIGH (ref 3.4–10.8)

## 2023-09-04 LAB — TSH+FREE T4
Free T4: 1.16 ng/dL (ref 0.82–1.77)
TSH: 1.68 u[IU]/mL (ref 0.450–4.500)

## 2023-09-04 LAB — HEMOGLOBIN A1C
Est. average glucose Bld gHb Est-mCnc: 105 mg/dL
Hgb A1c MFr Bld: 5.3 % (ref 4.8–5.6)

## 2023-09-04 LAB — VITAMIN D 25 HYDROXY (VIT D DEFICIENCY, FRACTURES): Vit D, 25-Hydroxy: 24 ng/mL — ABNORMAL LOW (ref 30.0–100.0)

## 2023-09-07 MED ORDER — VITAMIN D (ERGOCALCIFEROL) 1.25 MG (50000 UNIT) PO CAPS
50000.0000 [IU] | ORAL_CAPSULE | ORAL | 1 refills | Status: AC
Start: 2023-09-07 — End: ?

## 2023-10-01 IMAGING — DX DG THORACIC SPINE 3V
3 series · 3 of 3 positions shown · non-contrast
Comparison: None.

CLINICAL DATA: Back pain

EXAM:
THORACIC SPINE - 3 VIEWS

[t-spine ap]
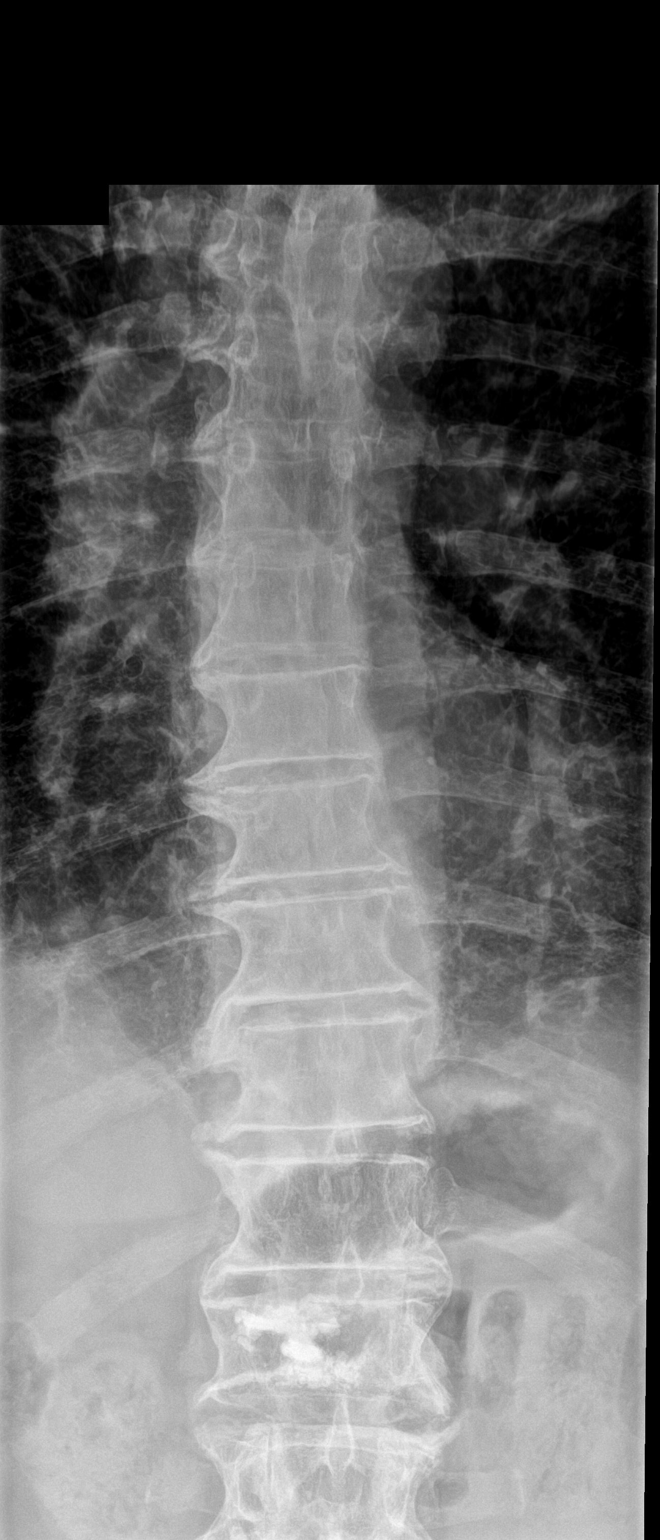

[t-spine lat]
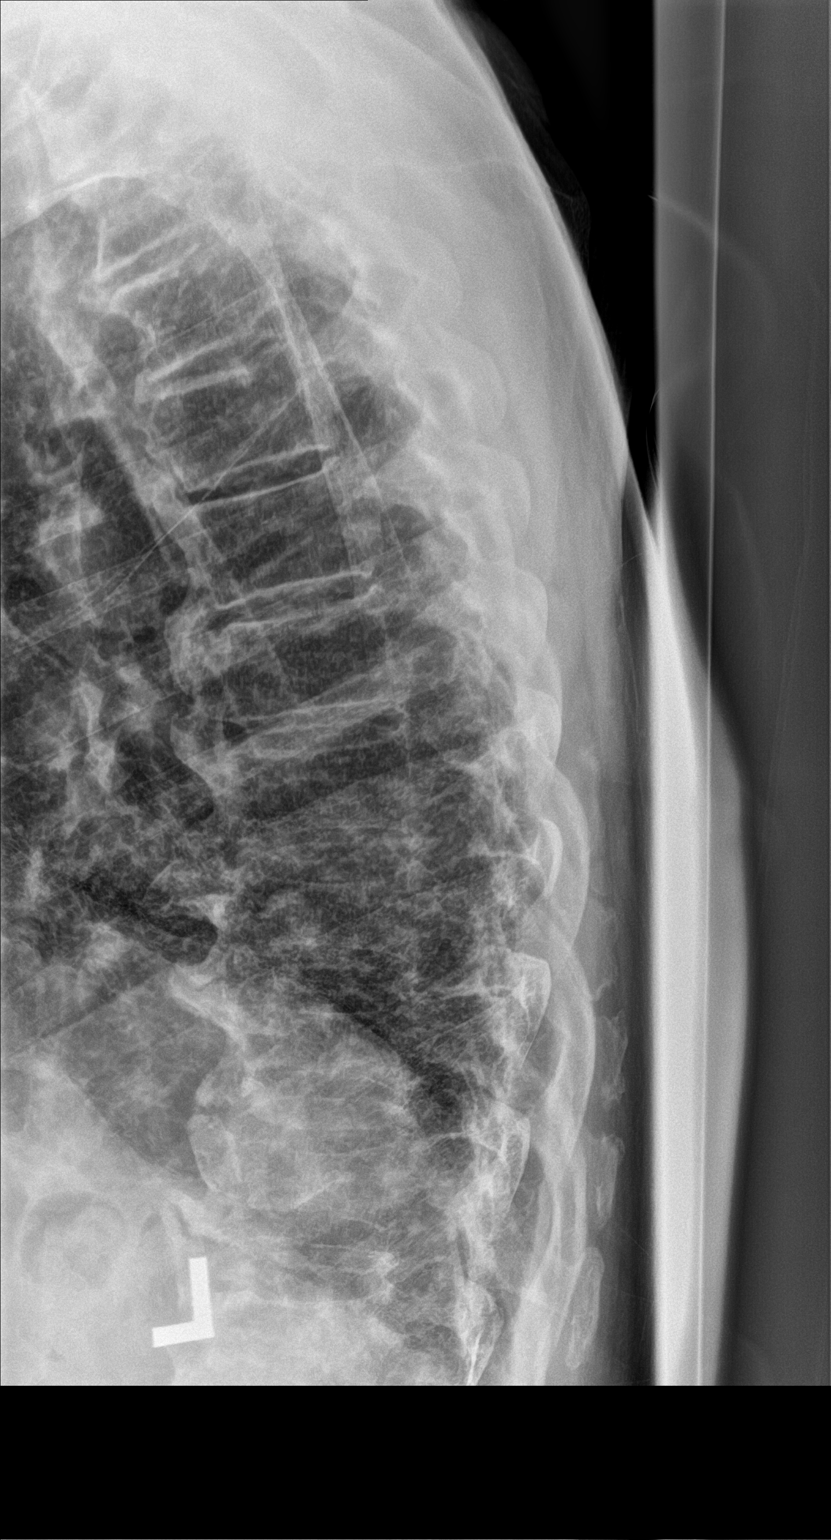

[ct-spine swimmers]
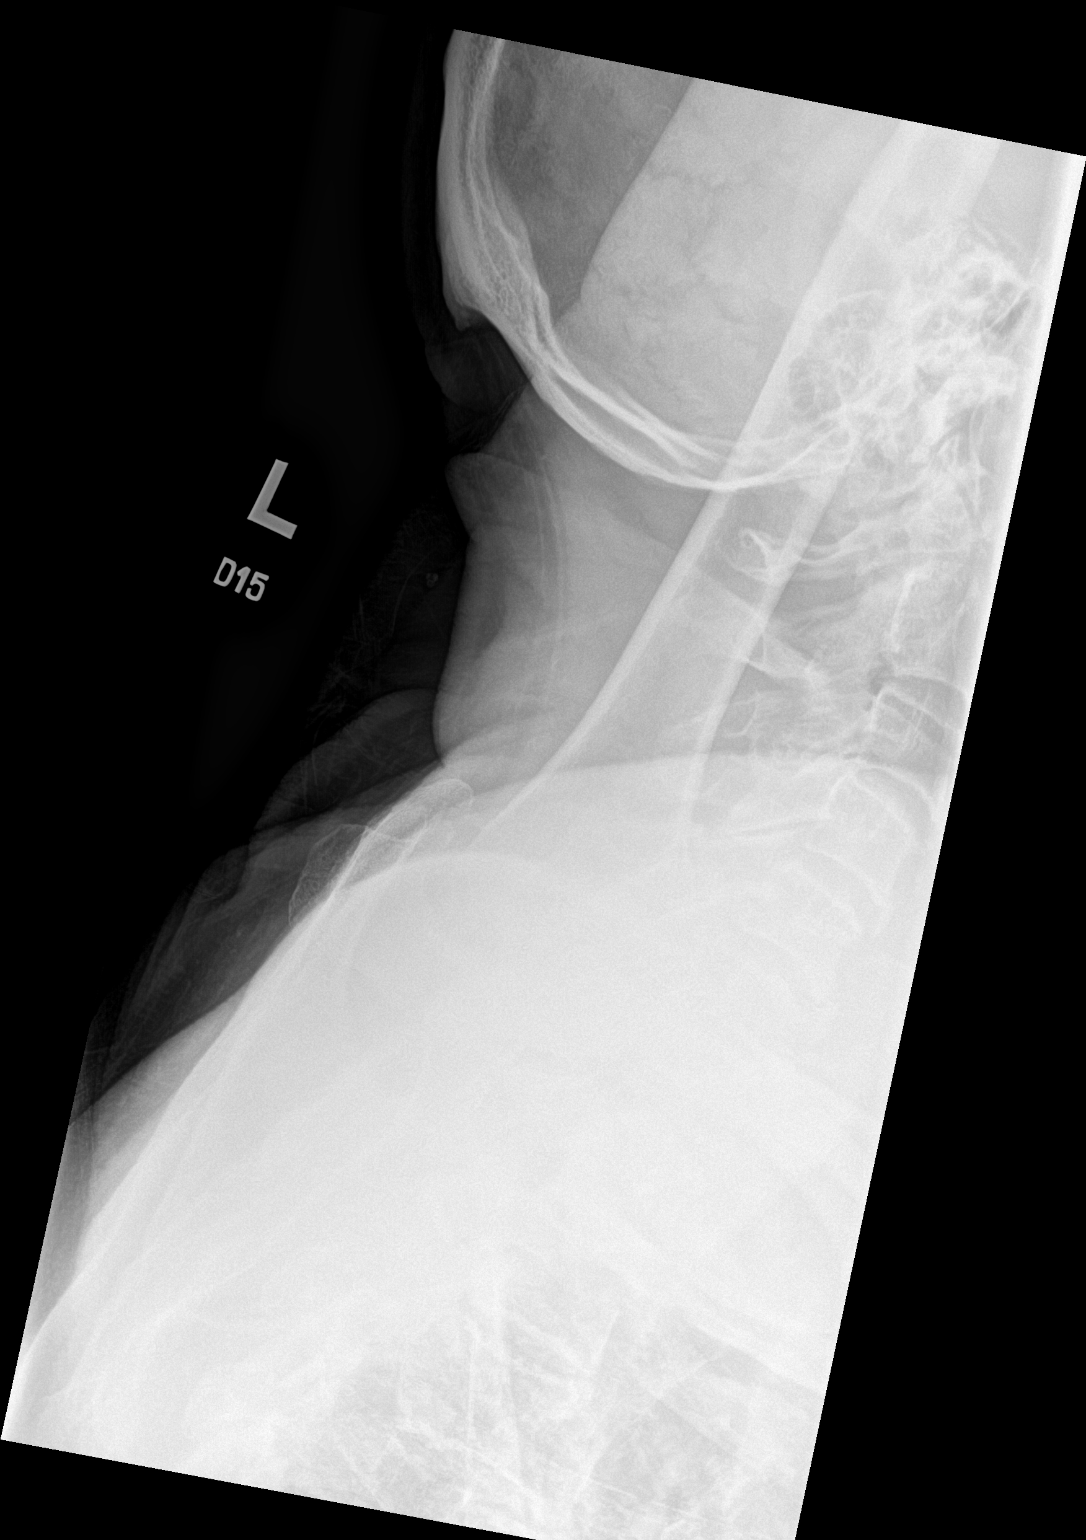

[3 of 3 positions shown; findings below may reference images not displayed]

FINDINGS: Mild dextroscoliosis. Vertebral body heights are grossly maintained.
Diffuse degenerative osteophytes.
IMPRESSION: Degenerative changes and scoliosis.  No acute osseous abnormality

## 2023-10-01 IMAGING — DX DG LUMBAR SPINE COMPLETE 4+V
5 series · 5 of 5 positions shown · non-contrast
Comparison: 08/10/2014

CLINICAL DATA: Back pain

EXAM:
LUMBAR SPINE - COMPLETE 4+ VIEW

[l-spine ap]
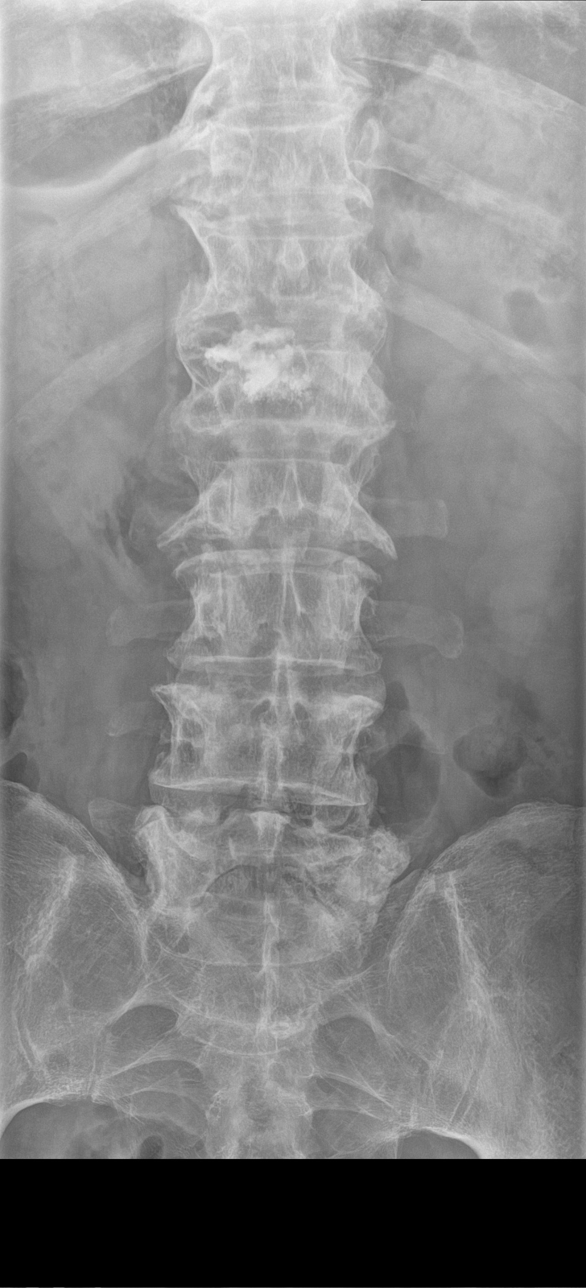

[l-spine obl (1 of 2)]
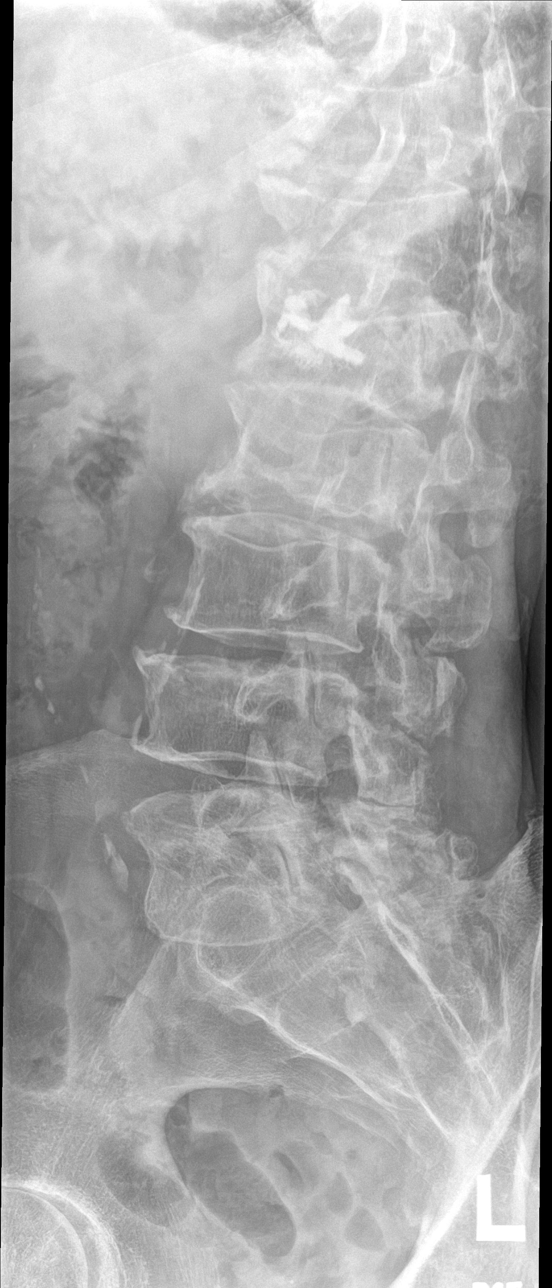

[l-spine obl (2 of 2)]
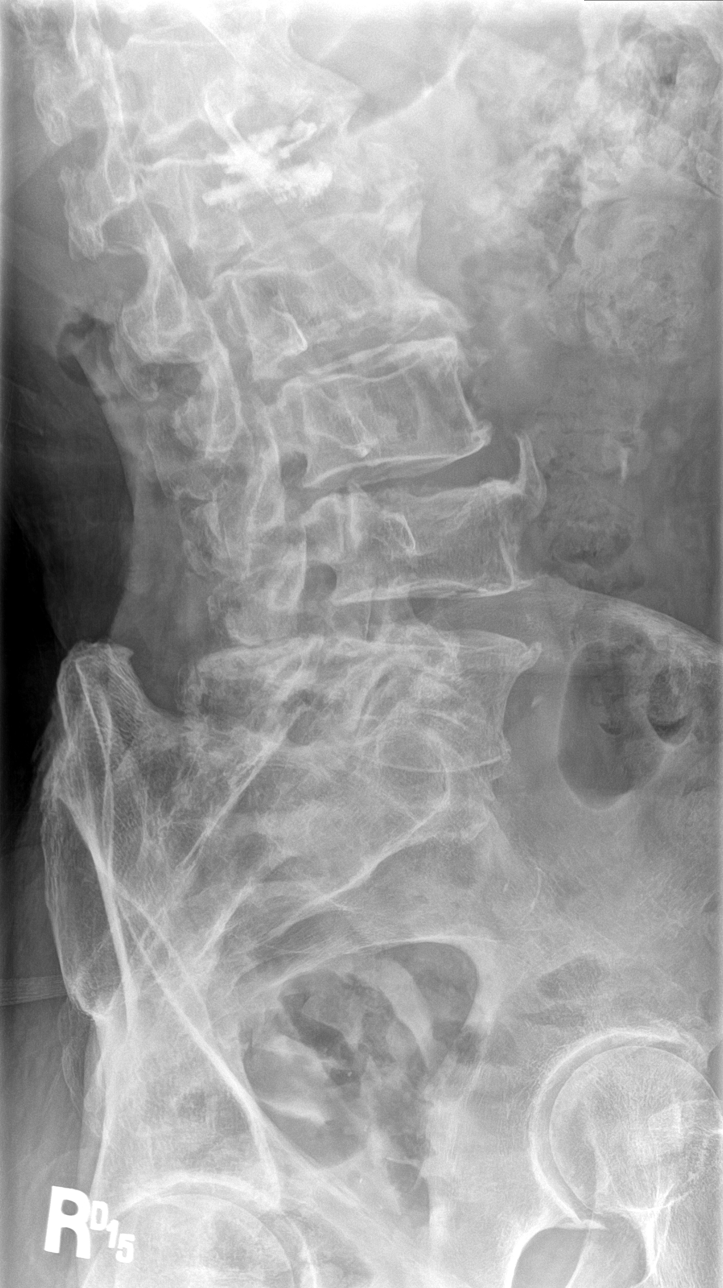

[l-spine lat]
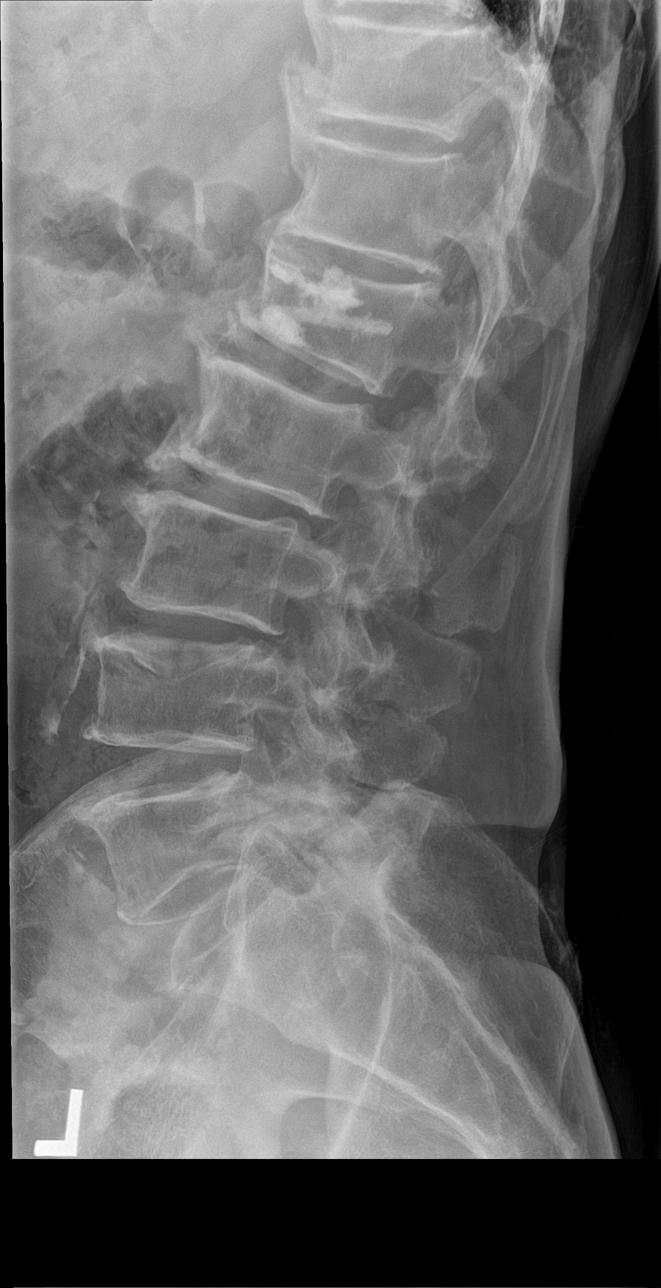

[l-spine spot]
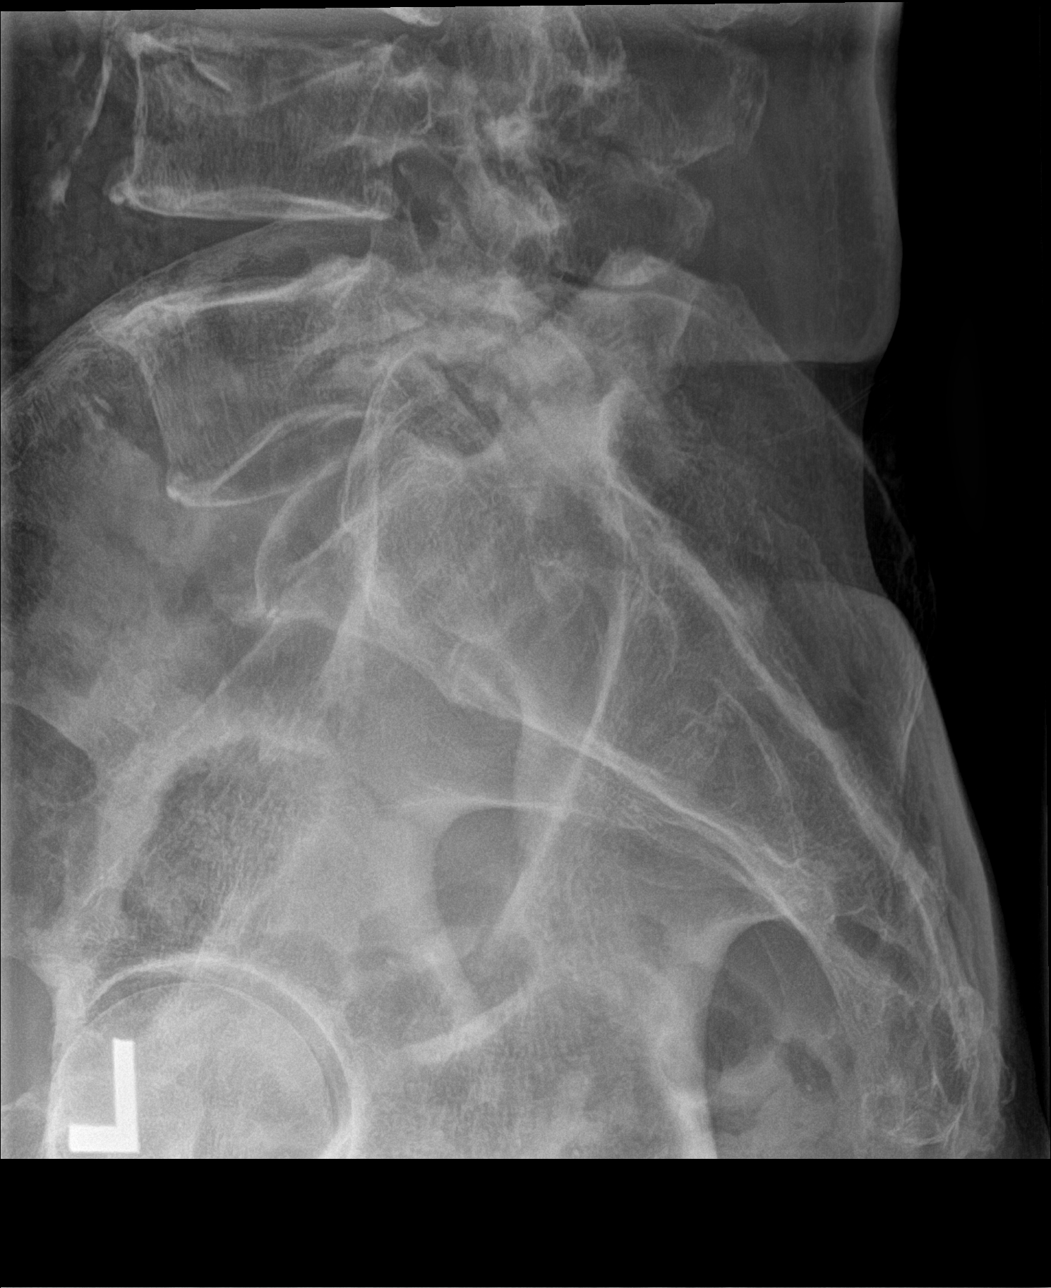

[5 of 5 positions shown; findings below may reference images not displayed]

FINDINGS: Lumbar alignment within normal limits. Treated compression deformity
at L1. Mild superior endplate deformity at L4, new compared to 7728
radiograph. Multilevel degenerative osteophyte. Mild disc space
narrowing at L2-L3 and L3-L4. Facet degenerative changes of the
lower lumbar spine.
IMPRESSION: 1. Mild superior endplate fracture at L4 of uncertain acuity but is
new since [DATE]. Treated compression deformity at L1.

## 2023-10-12 ENCOUNTER — Other Ambulatory Visit: Payer: Self-pay | Admitting: Family Medicine

## 2023-12-18 ENCOUNTER — Other Ambulatory Visit: Payer: Self-pay

## 2023-12-18 MED ORDER — DICLOFENAC SODIUM 75 MG PO TBEC: 75.0000 mg | DELAYED_RELEASE_TABLET | Freq: Two times a day (BID) | ORAL | 0 refills | Status: DC

## 2023-12-24 ENCOUNTER — Other Ambulatory Visit: Payer: Self-pay

## 2023-12-24 ENCOUNTER — Telehealth: Payer: Self-pay | Admitting: Family Medicine

## 2023-12-24 ENCOUNTER — Other Ambulatory Visit: Payer: Self-pay | Admitting: Family Medicine

## 2023-12-24 DIAGNOSIS — M25551 Pain in right hip: Secondary | ICD-10-CM

## 2023-12-24 NOTE — Telephone Encounter (Signed)
 Copied from CRM [id number]. Topic: Clinical - Medication Refill >> Dec 24, 2023  2:11 PM Antwanette L wrote: Medication: diclofenac  (VOLTAREN ) 75 MG EC tablet  Has the patient contacted their pharmacy? Yes  This is the patient's preferred pharmacy:  [facility] - [place], [place] - [address] [place] [address] Phone: [phone] Fax: [phone]  Is this the correct pharmacy for this prescription? Yes Has the prescription been filled recently? Yes. Last refill was sent on 12/18/23  Is the patient out of the medication? Yes  Has the patient been seen for an appointment in the last year OR does the patient have an upcoming appointment? Yes. Last ov with Meade Gerlach FNP was on 08/30/23  Can we respond through MyChart? No. Contact the patient by phone at [phone]  Agent: Please be advised that Rx refills may take up to 3 business days. We ask that you follow-up with your pharmacy.

## 2023-12-24 NOTE — Telephone Encounter (Signed)
 Copied from CRM [id number]. Topic: Referral - Request for Referral >> Dec 24, 2023  2:05 PM Antwanette L wrote: Did the patient discuss referral with their provider in the last year? Yes. Patient spoke with Meade at last ov on 08/30/23 about a orthopedic referral    Appointment offered? No  Type of order/referral and detailed reason for visit: orthopedic referral   Preference of office, provider, location: [facility] in [place]  If referral order, have you been seen by this specialty before? Yes. Patient had surgery on both knees by Dr. Beverley. The patient had surgery 2 years ago   Can we respond through MyChart? Yes and by phone at [phone]

## 2023-12-24 NOTE — Telephone Encounter (Unsigned)
 Copied from CRM [id number]. Topic: Clinical - Refused Triage >> Dec 24, 2023  2:10 PM Antwanette L wrote: The patient is experiencing pain from the right hip down to the knee but declined to speak with NT.

## 2023-12-24 NOTE — Telephone Encounter (Signed)
 Referral placed.

## 2023-12-24 NOTE — Telephone Encounter (Signed)
 Called left voicemail needs an appointment schedule

## 2024-01-02 ENCOUNTER — Ambulatory Visit: Admitting: Orthopedic Surgery

## 2024-01-02 ENCOUNTER — Encounter: Payer: Self-pay | Admitting: Orthopedic Surgery

## 2024-01-02 VITALS — BP 112/70 | HR 97 | Ht 71.0 in | Wt 178.0 lb

## 2024-01-02 DIAGNOSIS — M1611 Unilateral primary osteoarthritis, right hip: Secondary | ICD-10-CM | POA: Diagnosis not present

## 2024-01-02 NOTE — Progress Notes (Signed)
  Intake history:  Chief Complaint  Patient presents with   Leg Pain    Right anterior thigh pain -pain goes down to the knee denies groin pain but sometimes its so bad I can't walk.had TKA 02/08/21 takes tylenol  arthritis ibuprofen and aspirin  in between when pain is relly bad     BP 112/70   Pulse 97   Ht 5' 11 (1.803 m)   Wt 178 lb (80.7 kg)   BMI 24.83 kg/m  Body mass index is 24.83 kg/m.  Pharmacy? ____________[facility]______________________  WHAT ARE WE SEEING YOU FOR TODAY?   right lower leg(s)  How Albert Gonzalez has this bothered you? (DOI?DOS?WS?)  month(s) ago  Was there an injury? No  Anticoag.  No   Any ALLERGIES ______________________________________________   Treatment:  Have you taken:  Tylenol  Yes  Advil Yes  Had PT No  Had injection No  Other  _________________________

## 2024-01-02 NOTE — Progress Notes (Signed)
   Chief Complaint  Patient presents with   Leg Pain    Right anterior thigh pain -pain goes down to the knee denies groin pain but sometimes its so bad I can't walk.had TKA 02/08/21 takes tylenol  arthritis ibuprofen and aspirin  in between when pain is really bad. Xrays 08/2023 in [facility]    This is a 73 year old male status post total knee arthroplasty on the right back in December 2022 presents with right anterior thigh pain for several months.  The patient says he was able to walk for exercise and take care of his wife who has been having some major medical problems but recently noted that several hours into the day he would get pain in his right thigh anterior aspect and could not walk.  He tried Tylenol  and Advil and gets decent relief for several hours most of the time  Denies trauma  Leg Pain    Past Medical History:  Diagnosis Date   Anxiety    Arthritis    Cancer (HCC)    COPD (chronic obstructive pulmonary disease) (HCC)    Depression    History of kidney stones    Hyperlipidemia    Inguinal hernia right    Insomnia    Myalgia    PPD positive, treated 1979   1 year of INH   Raynaud phenomenon    Restless leg syndrome    Sarcoidosis 1980     PHYSICAL EXAM:   Physical findings include no significant leg length discrepancy hip flexion is 110 degrees he has some pain there he has pain with internal rotation and limited internal rotation and pain with logroll maneuver  Outside imaging shows severe arthritis of the right hip with mild head deformity there is joint space narrowing osteophytes and subchondral sclerosis    Assessment and Plan:   Encounter Diagnosis  Name Primary?   Primary osteoarthritis of right hip Yes   Plan the patient would like to proceed with operative intervention.  However his wife is undergoing procedures today and he will know tomorrow whether or not he can do the surgery within the next month or 2  He is open to intra-articular cortisone  injection if he cannot have surgery right away again depending on what the results are of his wife's procedure

## 2024-01-08 ENCOUNTER — Ambulatory Visit: Payer: Self-pay

## 2024-01-08 ENCOUNTER — Other Ambulatory Visit: Payer: Self-pay | Admitting: Family Medicine

## 2024-01-08 DIAGNOSIS — J4520 Mild intermittent asthma, uncomplicated: Secondary | ICD-10-CM

## 2024-01-08 NOTE — Telephone Encounter (Signed)
 FYI Only or Action Required?: Action required by provider: medication refill request.  Patient was last seen in primary care on 08/30/2023 by Albert Meade PEDLAR, FNP.  Called Nurse Triage reporting Medication Refill.  Triage Disposition: Call PCP Now  Patient/caregiver understands and will follow disposition?: Yes     Copied from CRM [id number]. Topic: Clinical - Red Word Triage >> Jan 08, 2024  2:05 PM [name] wrote: Red Word that prompted transfer to Nurse Triage: Patient is having trouble breathing, out of albuterol .  He would like to know if he needs to go to the er       Reason for Disposition  [1] Prescription refill request for ESSENTIAL medicine (i.e., likelihood of harm to patient if not taken) AND [2] triager unable to refill per department policy  Answer Assessment - Initial Assessment Questions Patient calling in about the refill request his pharmacy sent. I advised that I could see a refill that was sent about 10 minutes ago. Patient verbalized understanding and will contact his pharmacy. Patient ended the call before I could ask more questions about his breathing. Patient speaking in complete sentences throughout call.      1. DRUG NAME: What medicine do you need to have refilled?     Albuterol  inhaler  4. PRESCRIBER: Who prescribed it? Note: The prescribing doctor or group is responsible for refill approvals.SABRA Meade Edman, FNP 5. PHARMACY: Have you contacted your pharmacy (drugstore)? Note: Some pharmacies will contact the doctor (or NP/PA).      Yes 6. SYMPTOMS: Do you have any symptoms?     Shortness of breath  Protocols used: Medication Refill and Renewal Call-A-AH

## 2024-01-09 ENCOUNTER — Other Ambulatory Visit: Payer: Self-pay

## 2024-01-09 MED ORDER — DULOXETINE HCL 60 MG PO CPEP: 60.0000 mg | ORAL_CAPSULE | Freq: Two times a day (BID) | ORAL | 1 refills | Status: AC

## 2024-02-04 ENCOUNTER — Other Ambulatory Visit

## 2024-02-04 ENCOUNTER — Other Ambulatory Visit: Payer: Self-pay

## 2024-02-04 DIAGNOSIS — D508 Other iron deficiency anemias: Secondary | ICD-10-CM

## 2024-02-04 DIAGNOSIS — C184 Malignant neoplasm of transverse colon: Secondary | ICD-10-CM

## 2024-02-05 ENCOUNTER — Inpatient Hospital Stay: Attending: Physician Assistant

## 2024-02-05 ENCOUNTER — Ambulatory Visit (HOSPITAL_COMMUNITY)
Admission: RE | Admit: 2024-02-05 | Discharge: 2024-02-05 | Disposition: A | Source: Ambulatory Visit | Attending: Hematology | Admitting: Hematology

## 2024-02-05 DIAGNOSIS — Z08 Encounter for follow-up examination after completed treatment for malignant neoplasm: Secondary | ICD-10-CM | POA: Insufficient documentation

## 2024-02-05 DIAGNOSIS — D508 Other iron deficiency anemias: Secondary | ICD-10-CM

## 2024-02-05 DIAGNOSIS — C184 Malignant neoplasm of transverse colon: Secondary | ICD-10-CM

## 2024-02-05 DIAGNOSIS — Z85038 Personal history of other malignant neoplasm of large intestine: Secondary | ICD-10-CM | POA: Diagnosis present

## 2024-02-05 DIAGNOSIS — D649 Anemia, unspecified: Secondary | ICD-10-CM | POA: Insufficient documentation

## 2024-02-05 LAB — CBC WITH DIFFERENTIAL/PLATELET
Abs Immature Granulocytes: 0.01 K/uL (ref 0.00–0.07)
Basophils Absolute: 0.1 K/uL (ref 0.0–0.1)
Basophils Relative: 2 %
Eosinophils Absolute: 1.1 K/uL — ABNORMAL HIGH (ref 0.0–0.5)
Eosinophils Relative: 13 %
HCT: 49.6 % (ref 39.0–52.0)
Hemoglobin: 16.3 g/dL (ref 13.0–17.0)
Immature Granulocytes: 0 %
Lymphocytes Relative: 19 %
Lymphs Abs: 1.6 K/uL (ref 0.7–4.0)
MCH: 29.2 pg (ref 26.0–34.0)
MCHC: 32.9 g/dL (ref 30.0–36.0)
MCV: 88.7 fL (ref 80.0–100.0)
Monocytes Absolute: 0.7 K/uL (ref 0.1–1.0)
Monocytes Relative: 9 %
Neutro Abs: 4.7 K/uL (ref 1.7–7.7)
Neutrophils Relative %: 57 %
Platelets: 283 K/uL (ref 150–400)
RBC: 5.59 MIL/uL (ref 4.22–5.81)
RDW: 13.1 % (ref 11.5–15.5)
WBC: 8.3 K/uL (ref 4.0–10.5)
nRBC: 0 % (ref 0.0–0.2)

## 2024-02-05 LAB — COMPREHENSIVE METABOLIC PANEL WITH GFR
ALT: 30 U/L (ref 0–44)
AST: 32 U/L (ref 15–41)
Albumin: 4.5 g/dL (ref 3.5–5.0)
Alkaline Phosphatase: 115 U/L (ref 38–126)
Anion gap: 13 (ref 5–15)
BUN: 15 mg/dL (ref 8–23)
CO2: 24 mmol/L (ref 22–32)
Calcium: 9.6 mg/dL (ref 8.9–10.3)
Chloride: 101 mmol/L (ref 98–111)
Creatinine, Ser: 0.88 mg/dL (ref 0.61–1.24)
GFR, Estimated: >60 mL/min (ref >60)
Glucose, Bld: 57 mg/dL — ABNORMAL LOW (ref 70–99)
Potassium: 4.3 mmol/L (ref 3.5–5.1)
Sodium: 138 mmol/L (ref 135–145)
Total Bilirubin: 0.4 mg/dL (ref 0.0–1.2)
Total Protein: 7.3 g/dL (ref 6.5–8.1)

## 2024-02-05 LAB — IRON AND TIBC
Iron: 88 ug/dL (ref 45–182)
Saturation Ratios: 24 % (ref 17.9–39.5)
TIBC: 371 ug/dL (ref 250–450)
UIBC: 283 ug/dL

## 2024-02-05 LAB — VITAMIN B12: Vitamin B-12: 831 pg/mL (ref 180–914)

## 2024-02-05 LAB — FOLATE: Folate: >20 ng/mL (ref >5.9)

## 2024-02-05 LAB — FERRITIN: Ferritin: 121 ng/mL (ref 24–336)

## 2024-02-05 MED ORDER — IOHEXOL 300 MG/ML  SOLN
100.0000 mL | Freq: Once | INTRAMUSCULAR | Status: AC | PRN
  Administered 2024-02-05: 100 mL via INTRAVENOUS

## 2024-02-06 LAB — CEA: CEA: 4.4 ng/mL (ref 0.0–4.7)

## 2024-02-07 NOTE — Progress Notes (Unsigned)
 [facility] [address], [place] [address]   CLINIC:  Medical Oncology/Hematology  PCP:  Edman Meade PEDLAR, FNP [address] / [place] [address] [phone]   REASON FOR VISIT:  Follow-up for stage II ascending colon adenocarcinoma  PRIOR THERAPY: Right hemicolectomy on 11/02/2021  CURRENT THERAPY: Surveillance   INTERVAL HISTORY:   Mr. Albert Gonzalez, a 73 y.o. male, returns for routine follow-up of his stage II colon cancer (s/p right hemicolectomy in 2023).  Megan was last seen on 08/09/2023 by Dr. Rogers.   In the interim since last visit, he has not had any surgeries, hospitalizations, or changes in baseline health status.   At today's visit, he reports feeling fairly well.  He  reports 50% energy and 100% appetite.   He  is maintaining stable weight at this time.  He denies any changes in bowel habits, abdominal pain, nausea, or vomiting. He has not noticed any rectal bleeding or melena. No decreased appetite or unintentional weight loss.  He has not yet had his follow-up colonoscopy, as he is primary caregiver for his wife, who suffers from RA and also was diagnosed with bladder cancer this year.  Patient also reports that he has some right hip issues and is planning on right hip surgery in early 2026.  Patient does report worsening cough over the past few months.  He has chronic cough related to sarcoidosis, pulmonary fibrosis, and asthma.  He was last seen by pulmonology (Dr. Shellia) July 2024, but has not found a new pulmonologist after Dr. Shellia left.  ASSESSMENT & PLAN:  1.  Stage II (PT3 N0) ascending colon adenocarcinoma: - Colonoscopy (09/28/2021): Exophytic apple core appearing mass in the proximal colon.  Location in relation to the hepatic flexure or cecum could not be ascertained with certainty.  Nonbleeding internal hemorrhoids. - CEA on 09/28/2021 - 2.8 - CT [facility] (10/12/2021): Apple core lesion in the hepatic flexure with no  evidence of metastatic disease in the abdomen or pelvis. - Laparoscopic right hemicolectomy by Dr. Kallie on 11/02/2021 - Pathology: Moderately differentiated adenocarcinoma extending into pericolonic connective tissue, margins negative, 0/24 lymph nodes involved, negative LVI and PNI, MMR preserved. - Personal history of sarcoidosis from a left axillary lymph node biopsy prior to 2000  - - - - - - - - - - - - - - - - - - - - - - - - - - - - - - - - - - - - - - - - - - Denies any change in bowel habits, bleeding from rectum, or melena.   - Most recent CT abdomen/pelvis (02/05/2024): Right hemicolectomy with adjacent lymph nodes present dating back to at least 06/20/2022.  No evidence of disease progression. INCIDENTAL FINDING: Patient is aware of infrarenal aneurysm (measuring 5.1 cm), follows with vascular. INCIDENTAL FINDING: Bilateral pulmonary fibrosis noted, previously known diagnosis.  Patient needs referral to reestablish care with pulmonology (see below). - Most recent labs (02/16/2024): Grossly normal CBC/D, apart from chronically elevated eosinophils.  CMP normal.  Normal CEA 4.4. - He is overdue for posttreatment colonoscopy (due 1 year after diagnosis), but postponed due to his wife being ill.   - PLAN: RTC in 6 months for labs and OFFICE visit. - Will switch imaging to be done annually at this time.  Next CT A/P will be due in December 2026. - Refer back to Dr. Arvilla for surveillance colonoscopy. [facility] SURVIVORSHIP & SURVEILLANCE GUIDELINES: Stage II &  Stage III Colon Cancer: History and physical exam: Every 3 to 6 months x 2 years Then every 6 months for total of 5 years CEA monitoring:  Every 3 to 6 months x 2 years Then every 6 months for total of 5 years CT A/P (+/- chest) every 6 to 12 months from date of surgery for total of 5 years. Colonoscopy: 1 year after surgery (except if no complete preoperative colonoscopy, then colonoscopy in 3 to 6 months). If advanced adenoma,  repeat in 1 year. If no advanced adenoma, repeat in 3 years, then every 5 years. PET is not indicated for routine survivorship surveillance   2.  Normocytic anemia, RESOLVED - Onset of anemia in 2022, prior to diagnosis of colon cancer.  Lowest Hgb 6.8 noted following right hemicolectomy. - Denies any rectal bleeding or melena. - Most recent labs (02/05/2024): Normal Hgb 16.3/MCV 88.7.  Ferritin significantly improved/normalized at 121, with iron saturation 24%.  Normal B12 and folate. - Completed iron replacement therapy via oral iron.  Did not require any IV iron. - PLAN: Continue surveillance with repeat labs in 6 months. - No indication to restart oral iron at this time.  3.  Other health concerns - Reports worsening cough over the past few months.  He has chronic cough related to sarcoidosis, pulmonary fibrosis, and asthma.  He was last seen by pulmonology (Dr. Shellia) July 2024, but has not found a new pulmonologist after Dr. Shellia left. - PLAN: We will place referral to reestablish care with pulmonology.  4.  Social/family history: - He lives at home with his wife.  He worked as a buyer, retail and a engineer, civil (consulting) at [employer].  He smoked a pipe briefly for less than a year but was never cigarette smoker.  Maternal uncle had esophageal cancer.  PLAN SUMMARY: >> Labs in 6 months = CBC/D, CMP, CEA, ferritin, iron/TIBC >> OFFICE visit in 6 months (1 week after labs) >> Referral placed to gastroenterology (Dr. Shaaron) for colonoscopy >> Referral placed to pulmonology to reestablish care for pulmonary fibrosis    REVIEW OF SYSTEMS:   Review of Systems  Constitutional:  Positive for fatigue. Negative for appetite change, chills, diaphoresis, fever and unexpected weight change.  HENT:   Negative for lump/mass and nosebleeds.   Eyes:  Negative for eye problems.  Respiratory:  Positive for cough. Negative for hemoptysis and shortness of breath.   Cardiovascular:  Negative  for chest pain, leg swelling and palpitations.  Gastrointestinal:  Negative for abdominal pain, blood in stool, constipation, diarrhea, nausea and vomiting.  Genitourinary:  Negative for hematuria.   Skin: Negative.   Neurological:  Negative for dizziness, headaches and light-headedness.  Hematological:  Does not bruise/bleed easily.  Psychiatric/Behavioral:  Positive for sleep disturbance.     PHYSICAL EXAM:   Performance status (ECOG): 1 - Symptomatic but completely ambulatory  Vitals:   02/11/24 0847  BP: 109/76  Pulse: 82  Resp: 18  Temp: 97.6 F (36.4 C)  SpO2: 96%   Wt Readings from Last 3 Encounters:  02/11/24 174 lb (78.9 kg)  01/02/24 178 lb (80.7 kg)  08/30/23 174 lb (78.9 kg)   Physical Exam   PAST MEDICAL/SURGICAL HISTORY:  Past Medical History:  Diagnosis Date   Anxiety    Arthritis    Cancer (HCC)    COPD (chronic obstructive pulmonary disease) (HCC)    Depression    History of kidney stones    Hyperlipidemia    Inguinal hernia  right    Insomnia    Myalgia    PPD positive, treated 1979   1 year of INH   Raynaud phenomenon    Restless leg syndrome    Sarcoidosis 1980   Past Surgical History:  Procedure Laterality Date   AXILLARY LYMPH NODE BIOPSY Right 1980   BIOPSY  09/28/2021   Procedure: BIOPSY;  Surgeon: Shaaron Lamar HERO, MD;  [facility];  Service: Endoscopy;;   COLON SURGERY     COLONOSCOPY  02/08/2011   Procedure: COLONOSCOPY;  Surgeon: Lamar HERO Shaaron, MD;  [facility];  Service: Endoscopy;  Laterality: N/A;  1:30 PM   COLONOSCOPY WITH PROPOFOL  N/A 09/28/2021   Procedure: COLONOSCOPY WITH PROPOFOL ;  Surgeon: Shaaron Lamar HERO, MD;  [facility]: [facility];  Service: Endoscopy;  Laterality: N/A;  2:15pm   INGUINAL HERNIA REPAIR Right 01/29/2017   Procedure: HERNIA REPAIR INGUINAL ADULT WITH MESH;  Surgeon: Kallie Manuelita BROCKS, MD;  Location: [facility];  Service: General;  Laterality: Right;   KNEE ARTHROSCOPY WITH MEDIAL  MENISECTOMY Right 04/05/2016   Procedure: RIGHT KNEE ARTHROSCOPY WITH PARTIAL  LATERAL MENISECTOMY, DEBRIDEMENT MEDIAL MENISCUS, ACL DEBRIDEMENT, MICRO FRACTURE OF FEMUR;  Surgeon: Taft FORBES Minerva, MD;  Location: [facility];  Service: Orthopedics;  Laterality: Right;   KNEE SURGERY     left   KYPHOPLASTY     LAPAROSCOPIC PARTIAL COLECTOMY N/A 11/02/2021   Procedure: LAPAROSCOPIC EXTENDED RIGHT HEMICOLECTOMY;  Surgeon: Kallie Manuelita BROCKS, MD;  Location: [facility];  Service: General;  Laterality: N/A;   POLYPECTOMY  09/28/2021   Procedure: POLYPECTOMY;  Surgeon: Shaaron Lamar HERO, MD;  [facility];  Service: Endoscopy;;   TONSILLECTOMY     TOTAL KNEE ARTHROPLASTY Right 02/08/2021   Procedure: TOTAL KNEE ARTHROPLASTY;  Surgeon: Minerva Taft FORBES, MD;  Location: [facility];  Service: Orthopedics;  Laterality: Right;    SOCIAL HISTORY:  Social History   Socioeconomic History   Marital status: Married    Spouse name: Not on file   Number of children: 1   Years of education: Not on file   Highest education level: Associate degree: academic program  Occupational History   Occupation: respiratory therapist    Employer: [employer]  Tobacco Use   Smoking status: Former    Current packs/day: 0.00    Average packs/day: 0.5 packs/day for 20.0 years (10.0 ttl pk-yrs)    Types: Pipe, Cigarettes    Start date: 04/03/1970    Quit date: 04/03/1990    Years since quitting: 33.8   Smokeless tobacco: Never   Tobacco comments:    quit many years ago  Vaping Use   Vaping status: Never Used  Substance and Sexual Activity   Alcohol use: Not Currently    Comment: occassional   Drug use: No   Sexual activity: Yes  Other Topics Concern   Not on file  Social History Narrative   Not on file   Social Drivers of Health   Tobacco Use: Medium Risk (01/02/2024)   Patient History    Smoking Tobacco Use: Former    Smokeless Tobacco Use: Never    Passive Exposure: Not on Programmer, Applications Strain: Low Risk (08/29/2023)   Overall Financial Resource Strain (CARDIA)    Difficulty of Paying Living Expenses: Not very hard  Food Insecurity: No Food Insecurity (08/29/2023)   Epic    Worried About Running Out of Food in the Last Year: Never true  Ran Out of Food in the Last Year: Never true  Transportation Needs: No Transportation Needs (08/29/2023)   Epic    Lack of Transportation (Medical): No    Lack of Transportation (Non-Medical): No  Physical Activity: Inactive (08/29/2023)   Exercise Vital Sign    Days of Exercise per Week: 0 days    Minutes of Exercise per Session: Not on file  Stress: No Stress Concern Present (08/29/2023)   [employer] of Occupational Health - Occupational Stress Questionnaire    Feeling of Stress: Not at all  Social Connections: Moderately Integrated (08/29/2023)   Social Connection and Isolation Panel    Frequency of Communication with Friends and Family: More than three times a week    Frequency of Social Gatherings with Friends and Family: Patient declined    Attends Religious Services: Patient declined    Database Administrator or Organizations: Yes    Attends Banker Meetings: Patient declined    Marital Status: Married  Catering Manager Violence: Not At Risk (11/02/2021)   Humiliation, Afraid, Rape, and Kick questionnaire    Fear of Current or Ex-Partner: No    Emotionally Abused: No    Physically Abused: No    Sexually Abused: No  Depression (PHQ2-9): Low Risk (02/11/2024)   Depression (PHQ2-9)    PHQ-2 Score: 1  Alcohol Screen: Not on file  Housing: Unknown (08/29/2023)   Epic    Unable to Pay for Housing in the Last Year: No    Number of Times Moved in the Last Year: Not on file    Homeless in the Last Year: No  Utilities: Not At Risk (11/02/2021)   [facility] Utilities    Threatened with loss of utilities: No  Health Literacy: Not on file    FAMILY HISTORY:  Family History  Problem Relation Age of Onset    Suicidality Father    Colon cancer Neg Hx    Liver disease Neg Hx    Inflammatory bowel disease Neg Hx    Anesthesia problems Neg Hx     CURRENT MEDICATIONS:  Current Outpatient Medications  Medication Sig Dispense Refill   albuterol  (VENTOLIN  HFA) 108 (90 Base) MCG/ACT inhaler INHALE ONE PUFF INTO THE LUNGS FOUR TIMES DAILY AS NEEDED 6.7 g 0   aspirin  325 MG tablet Take 325 mg by mouth daily.     Calcium  Citrate-Vitamin D  (CALCIUM  + D PO) Take 2 tablets by mouth daily.     diclofenac  (VOLTAREN ) 75 MG EC tablet Take 1 tablet (75 mg total) by mouth 2 (two) times daily. 60 tablet 0   DULoxetine  (CYMBALTA ) 60 MG capsule Take 1 capsule (60 mg total) by mouth 2 (two) times daily. 180 capsule 1   fluticasone -salmeterol (ADVAIR) 250-50 MCG/ACT AEPB Inhale 1 puff into the lungs in the morning and at bedtime. 120 each 2   Misc Natural Products (GLUCOSAMINE CHOND DOUBLE STR PO) Take 2 tablets by mouth daily.     Multiple Vitamin (MULTIVITAMIN) tablet Take 1 tablet by mouth daily. Men's 50+     pyridOXINE (VITAMIN B6) 50 MG tablet Take 1 tablet (50 mg total) by mouth daily. 30 tablet 1   Vitamin D , Ergocalciferol , (DRISDOL ) 1.25 MG (50000 UNIT) CAPS capsule Take 1 capsule (50,000 Units total) by mouth every 7 (seven) days. 27 capsule 1   atorvastatin  (LIPITOR) 40 MG tablet TAKE ONE TABLET BY MOUTH EVERY NIGHT AT BEDTIME (Patient not taking: Reported on 02/11/2024) 90 tablet 0   No current facility-administered medications  for this visit.    ALLERGIES:  Allergies[1]  LABORATORY DATA:  I have reviewed the labs as listed.     Latest Ref Rng & Units 02/05/2024   12:39 PM 09/03/2023    1:23 PM 08/02/2023    2:36 PM  CBC  WBC 4.0 - 10.5 K/uL 8.3  12.9  8.6   Hemoglobin 13.0 - 17.0 g/dL 83.6  83.8  84.4   Hematocrit 39.0 - 52.0 % 49.6  50.1  46.7   Platelets 150 - 400 K/uL 283  341  299       Latest Ref Rng & Units 02/05/2024   12:39 PM 09/03/2023    1:23 PM 08/02/2023    2:36 PM  CMP  Glucose 70  - 99 mg/dL 57  83  80   BUN 8 - 23 mg/dL 15  19  17    Creatinine 0.61 - 1.24 mg/dL 9.11  8.91  9.21   Sodium 135 - 145 mmol/L 138  139  134   Potassium 3.5 - 5.1 mmol/L 4.3  4.4  4.3   Chloride 98 - 111 mmol/L 101  100  105   CO2 22 - 32 mmol/L 24  23  24    Calcium  8.9 - 10.3 mg/dL 9.6  9.8  9.1   Total Protein 6.5 - 8.1 g/dL 7.3  6.9  6.9   Total Bilirubin 0.0 - 1.2 mg/dL 0.4  0.5  0.7   Alkaline Phos 38 - 126 U/L 115  116  108   AST 15 - 41 U/L 32  20  25   ALT 0 - 44 U/L 30  26  23      DIAGNOSTIC IMAGING:  I have independently reviewed the scans and discussed with the patient. CT ABDOMEN PELVIS W CONTRAST Result Date: 02/08/2024 CLINICAL DATA:  Colon cancer.  * Tracking Code: BO * EXAM: CT ABDOMEN AND PELVIS WITH CONTRAST TECHNIQUE: Multidetector CT imaging of the abdomen and pelvis was performed using the standard protocol following bolus administration of intravenous contrast. RADIATION DOSE REDUCTION: This exam was performed according to the departmental dose-optimization program which includes automated exposure control, adjustment of the mA and/or kV according to patient size and/or use of iterative reconstruction technique. CONTRAST:  OMNIPAQUE  IOHEXOL  300 MG/ML  SOLN COMPARISON:  08/02/2023. FINDINGS: Lower chest: Basilar subpleural reticulation, traction bronchiectasis/bronchiolectasis and coarsened ground-glass. Atherosclerotic calcification of the aorta, aortic valve and coronary arteries. Heart is at the upper limits of normal in size to mildly enlarged. No pericardial or pleural effusion. Distal esophagus is grossly unremarkable. Hepatobiliary: Liver and gallbladder are unremarkable. No biliary ductal dilatation. Pancreas: Negative. Spleen: Negative. Adrenals/Urinary Tract: Adrenal glands are unremarkable. Small low-attenuation lesions in the kidneys, too small to characterize. No specific follow-up necessary. Kidneys are otherwise unremarkable. Ureters are decompressed.  Bladder is relatively low in volume. Stomach/Bowel: Tiny hiatal hernia. Right hemicolectomy. Stomach, small bowel and colon are otherwise unremarkable. Vascular/Lymphatic: Infrarenal aorta measures 5.1 cm, as before. Atherosclerotic calcification of the aorta. Lymph nodes in the region of the enterocolonic anastomosis and surgical clips measure up to 1.3 cm in short axis (2/40), present dating back to at least 06/20/2022. No additional pathologically enlarged lymph nodes. Reproductive: Prostate is visualized. Other: Small left inguinal hernia contains fat. Right inguinal hernia repair. No free fluid. Mesenteries and peritoneum are unremarkable. Musculoskeletal: Degenerative changes in the spine. L1 vertebral body augmentation. Old L2 compression fracture. Advanced right hip osteoarthritis. Levoconvex curvature. IMPRESSION: 1. Right hemicolectomy with adjacent lymph nodes present  dating back to at least 06/20/2022. No evidence of disease progression. 2. Basilar pulmonary fibrosis, likely usual interstitial pneumonitis. 3. 5.1 cm infrarenal aneurysm. This can be followed on routine oncologic imaging. 4. Aortic atherosclerosis (ICD10-I70.0). Coronary artery calcification. Electronically Signed   By: Newell Eke M.D.   On: 02/08/2024 11:23     WRAP UP:  All questions were answered. The patient knows to call the clinic with any problems, questions or concerns.  Medical decision making: Moderate  Time spent on visit: I spent 25 minutes counseling the patient face to face. The total time spent in the appointment was 40 minutes and more than 50% was on counseling.  Pleasant CHRISTELLA Barefoot, PA-C  02/11/2024 10:08 AM      [1]  Allergies Allergen Reactions   Sulfa Antibiotics Rash    Childhood

## 2024-02-11 ENCOUNTER — Inpatient Hospital Stay: Admitting: Physician Assistant

## 2024-02-11 VITALS — BP 109/76 | HR 82 | Temp 97.6°F | Resp 18 | Ht 71.0 in | Wt 174.0 lb

## 2024-02-11 DIAGNOSIS — C184 Malignant neoplasm of transverse colon: Secondary | ICD-10-CM

## 2024-02-11 DIAGNOSIS — J8410 Pulmonary fibrosis, unspecified: Secondary | ICD-10-CM | POA: Diagnosis not present

## 2024-02-11 DIAGNOSIS — D508 Other iron deficiency anemias: Secondary | ICD-10-CM

## 2024-02-11 DIAGNOSIS — Z9049 Acquired absence of other specified parts of digestive tract: Secondary | ICD-10-CM

## 2024-02-11 DIAGNOSIS — Z08 Encounter for follow-up examination after completed treatment for malignant neoplasm: Secondary | ICD-10-CM | POA: Diagnosis not present

## 2024-02-11 NOTE — Patient Instructions (Signed)
 [facility] at [facility] **VISIT SUMMARY & IMPORTANT INSTRUCTIONS **   You were seen today by Pleasant Barefoot PA-C for your history of stage II colon cancer.   You did not have any evidence of recurrent colon cancer based on your CT scan or labs. You do not need to restart your iron supplement at this time, as your iron deficiency anemia has resolved. You are due for surveillance colonoscopy with gastroenterology.  We will send referral to Dr. Shaaron. We will also send referral to pulmonology for follow-up of your sarcoidosis, pulmonary fibrosis, and asthma.  NEXT STEPS Labs and office visit in 6 months CT scan in 1 year  ** Thank you for trusting me with your healthcare!  I strive to provide all of my patients with quality care at each visit.  If you receive a survey for this visit, I would be so grateful to you for taking the time to provide feedback.  Thank you in advance!  ~ Jlen Wintle                                        Dr. Mickiel Davonna Pleasant Barefoot, PA-C          Delon Hope, NP   - - - - - - - - - - - - - - - - - -    Thank you for choosing [facility] at [facility] to provide your oncology and hematology care.  To afford each patient quality time with our provider, please arrive at least 15 minutes before your scheduled appointment time.   If you have a lab appointment with the Cancer Center please come in thru the Main Entrance and check in at the main information desk.  You need to re-schedule your appointment should you arrive 10 or more minutes late.  We strive to give you quality time with our providers, and arriving late affects you and other patients whose appointments are after yours.  Also, if you no show three or more times for appointments you may be dismissed from the clinic at the providers discretion.     Again, thank you for choosing [facility].  Our hope is that these requests will  decrease the amount of time that you wait before being seen by our physicians.       _____________________________________________________________  Should you have questions after your visit to [facility], please contact our office at [phone] and follow the prompts.  Our office hours are 8:00 a.m. and 4:30 p.m. Monday - Friday.  Please note that voicemails left after 4:00 p.m. may not be returned until the following business day.  We are closed weekends and major holidays.  You do have access to a nurse 24-7, just call the main number to the clinic [phone] and do not press any options, hold on the line and a nurse will answer the phone.    For prescription refill requests, have your pharmacy contact our office and allow 72 hours.

## 2024-02-15 ENCOUNTER — Ambulatory Visit: Admitting: Orthopedic Surgery

## 2024-02-15 ENCOUNTER — Other Ambulatory Visit: Payer: Self-pay

## 2024-02-15 DIAGNOSIS — Z96651 Presence of right artificial knee joint: Secondary | ICD-10-CM | POA: Diagnosis not present

## 2024-02-15 DIAGNOSIS — M1711 Unilateral primary osteoarthritis, right knee: Secondary | ICD-10-CM | POA: Diagnosis not present

## 2024-02-15 DIAGNOSIS — M5410 Radiculopathy, site unspecified: Secondary | ICD-10-CM | POA: Diagnosis not present

## 2024-02-15 NOTE — Progress Notes (Signed)
 ANNUAL FOLLOW UP FOR right TKA   Chief Complaint  Patient presents with   Post-op Follow-up    Right TKR      HPI: The patient is here for the annual  follow-up x-ray for knee replacement. The patient is not complaining of pain weakness instability or stiffness in the repaired knee.   ROS Right hip pain currently asymptomatic with no groin or anterior thigh pain  However he is symptomatic lower back buttock lateral and occasionally entire right leg specially with ambulating such as getting the groceries    Examination of the right eighth KNEE  There were no vitals taken for this visit. General the patient is normally groomed in no distress Inspection shows : incision healed nicely without erythema, no tenderness no swelling Range of motion total range of motion is 120 Stability the knee is stable anterior to posterior as well as medial to lateral Strength quadriceps strength is normal Skin no erythema around the skin incision Neuro: normal sensation in the operative leg  Gait: normal expected gait without cane    Medical decision-making section X-rays ordered, internal imaging shows (see full dictated report) stable implant with no signs of loosening CT scan done for abdomen shows severe degenerative changes in his lower spine   Diagnosis  Encounter Diagnoses  Name Primary?   S/P total knee replacement, right. 2022 Yes   Primary osteoarthritis of right knee      Plan 2 months check spine again after physical therapy I think his right leg pain is coming from his lower back he is not having any groin pain right now

## 2024-02-15 NOTE — Progress Notes (Signed)
" ° ° °  02/15/2024   Chief Complaint  Patient presents with   Post-op Follow-up    Right TKR     Encounter Diagnoses  Name Primary?   S/P total knee replacement, right. 2022 Yes   Primary osteoarthritis of right knee     What pharmacy do you use ? _______[place] ____________________  DOI/DOS/ Date: 02/08/2021  Did you get better, worse or no change (Answer below)   Improved      "

## 2024-02-15 NOTE — Patient Instructions (Signed)
 Physical therapy has been ordered for you at [facility]. They should call you to schedule, [phone] is the phone number to call, if you want to call to schedule.

## 2024-02-18 ENCOUNTER — Ambulatory Visit: Payer: Medicare HMO | Admitting: Orthopedic Surgery

## 2024-02-25 ENCOUNTER — Encounter: Payer: Self-pay | Admitting: *Deleted

## 2024-02-25 ENCOUNTER — Other Ambulatory Visit: Payer: Self-pay | Admitting: Family Medicine

## 2024-03-06 ENCOUNTER — Encounter (INDEPENDENT_AMBULATORY_CARE_PROVIDER_SITE_OTHER): Payer: Self-pay | Admitting: *Deleted

## 2024-03-07 ENCOUNTER — Encounter: Payer: Self-pay | Admitting: Family Medicine

## 2024-03-07 ENCOUNTER — Other Ambulatory Visit: Payer: Self-pay | Admitting: Family Medicine

## 2024-03-07 MED ORDER — ATORVASTATIN CALCIUM 40 MG PO TABS: 40.0000 mg | ORAL_TABLET | Freq: Every day | ORAL | 0 refills | Status: AC

## 2024-03-07 NOTE — Telephone Encounter (Signed)
 Copied from CRM #[id number]. Topic: Clinical - Medication Refill >> Mar 07, 2024 12:21 PM Sophia H wrote: Medication: atorvastatin  (LIPITOR) 40 MG tablet   Has the patient contacted their pharmacy? Yes, pharmacy stated had been requesting with no response.   This is the patient's preferred pharmacy:  [facility] - [place], [place] - [address] [place] [address] Phone: [phone] Fax: [phone]  Is this the correct pharmacy for this prescription? Yes If no, delete pharmacy and type the correct one.   Has the prescription been filled recently? Yes  Is the patient out of the medication? Yes, been out since end of December.   Has the patient been seen for an appointment in the last year OR does the patient have an upcoming appointment? Yes, has appt in July.   Can we respond through MyChart? Yes  Agent: Please be advised that Rx refills may take up to 3 business days. We ask that you follow-up with your pharmacy.

## 2024-03-20 ENCOUNTER — Encounter: Payer: Self-pay | Admitting: Emergency Medicine

## 2024-03-20 ENCOUNTER — Ambulatory Visit: Admission: EM | Admit: 2024-03-20 | Discharge: 2024-03-20 | Disposition: A | Discharge status: Home / Self Care

## 2024-03-20 DIAGNOSIS — M25511 Pain in right shoulder: Secondary | ICD-10-CM | POA: Diagnosis not present

## 2024-03-20 DIAGNOSIS — Z8739 Personal history of other diseases of the musculoskeletal system and connective tissue: Secondary | ICD-10-CM | POA: Diagnosis not present

## 2024-03-20 MED ORDER — KETOROLAC TROMETHAMINE 30 MG/ML IJ SOLN
30.0000 mg | Freq: Once | INTRAMUSCULAR | Status: AC
  Administered 2024-03-20: 30 mg via INTRAMUSCULAR

## 2024-03-20 MED ORDER — DEXAMETHASONE SOD PHOSPHATE PF 10 MG/ML IJ SOLN
10.0000 mg | Freq: Once | INTRAMUSCULAR | Status: AC
  Administered 2024-03-20: 10 mg via INTRAMUSCULAR

## 2024-03-20 MED ORDER — PREDNISONE 20 MG PO TABS: 40.0000 mg | ORAL_TABLET | Freq: Every day | ORAL | 0 refills | Status: AC

## 2024-03-20 NOTE — Discharge Instructions (Signed)
 You were given injections of Decadron  10 mg and Toradol  30 mg.  Do not take any additional NSAIDs today to include ibuprofen, Aleve , naproxen , Motrin, Advil, or diclofenac .  You may take arthritis strength Tylenol  for breakthrough pain. Start the prednisone  tomorrow.  While you are taking the prednisone , continue Tylenol  for pain or discomfort.  You may resume diclofenac  once you complete the prednisone . Recommend using the topical diclofenac  cream that you have at home to apply to the shoulder as needed for pain or discomfort. Gentle range of motion exercises while symptoms persist, try to perform exercises at least 2-3 times daily. Recommend the use of ice or heat.  Apply ice for pain or swelling, heat for spasm or stiffness.  Apply for 20 minutes, remove for 1 hour, repeat as needed while symptoms persist. As discussed, if your symptoms fail to improve with this treatment, recommend follow-up with orthopedics for further evaluation. Follow-up as needed.

## 2024-03-20 NOTE — ED Triage Notes (Signed)
 Right shoulder pain x 1 week.  Denies any injury. Hurts to move arm

## 2024-03-20 NOTE — ED Provider Notes (Signed)
 " [facility]    CSN: [id number] Arrival date & time: 03/20/24  1621      History   Chief Complaint No chief complaint on file.   HPI Albert Gonzalez is a 74 y.o. male.   The history is provided by the patient and the spouse.   Patient presents with his spouse for 2-week history of right shoulder pain.  Patient denies injury, trauma, numbness, or decreased range of motion.  He further denies swelling or erythema.  Patient states that he has relief of his symptoms when he holds his arm close to his body.  Patient reports history of osteoarthritis.  Patient states that he does take diclofenac  75 mg normally, but symptoms have not improved.  Also states he has been taking over-the-counter analgesics such as arthritis strength Tylenol  with minimal relief.  Past Medical History:  Diagnosis Date   Anxiety    Arthritis    Cancer (HCC)    COPD (chronic obstructive pulmonary disease) (HCC)    Depression    History of kidney stones    Hyperlipidemia    Inguinal hernia right    Insomnia    Myalgia    PPD positive, treated 1979   1 year of INH   Raynaud phenomenon    Restless leg syndrome    Sarcoidosis 1980    Patient Active Problem List   Diagnosis Date Noted   Pain of right hip 08/30/2023   Chronic left shoulder pain 07/11/2022   Thumb pain, left 04/01/2022   Encounter for general adult medical examination with abnormal findings 01/23/2022   S/P right hemicolectomy 11/15/2021   Need for immunization against influenza 11/15/2021   Colon cancer (HCC) 11/02/2021   Aortic atherosclerosis 10/26/2021   Neoplasm of hepatic flexure of colon 10/17/2021   Abdominal aortic aneurysm (AAA) 3.0 cm to 5.5 cm in diameter in male 09/21/2021   Snoring 07/04/2021   Asthma 07/04/2021   Bilateral low back pain 06/23/2021   S/P total knee replacement, right 02/08/21 02/22/2021   Osteoarthritis of right knee 02/08/2021   Sepsis due to pneumonia (HCC) 09/09/2020   Depression     Anxiety    Hyperlipidemia    Arthritis    Sarcoidosis    Right inguinal hernia    MDD (major depressive disorder), recurrent episode, moderate (HCC) 08/24/2016   Acute medial meniscus tear of left knee    Tear of lateral meniscus of right knee, current    Primary osteoarthritis of right knee    Deficiency of anterior cruciate ligament of right knee    Abnormal CT scan, colon 02/01/2011   Constipation 02/01/2011    Past Surgical History:  Procedure Laterality Date   AXILLARY LYMPH NODE BIOPSY Right 1980   BIOPSY  09/28/2021   Procedure: BIOPSY;  Surgeon: Shaaron Lamar HERO, MD;  [facility]: [facility];  Service: Endoscopy;;   COLON SURGERY     COLONOSCOPY  02/08/2011   Procedure: COLONOSCOPY;  Surgeon: Lamar HERO Shaaron, MD;  [facility]: [facility];  Service: Endoscopy;  Laterality: N/A;  1:30 PM   COLONOSCOPY WITH PROPOFOL  N/A 09/28/2021   Procedure: COLONOSCOPY WITH PROPOFOL ;  Surgeon: Shaaron Lamar HERO, MD;  Location: [facility];  Service: Endoscopy;  Laterality: N/A;  2:15pm   INGUINAL HERNIA REPAIR Right 01/29/2017   Procedure: HERNIA REPAIR INGUINAL ADULT WITH MESH;  Surgeon: Kallie Manuelita BROCKS, MD;  Location: [facility];  Service: General;  Laterality: Right;   KNEE ARTHROSCOPY WITH MEDIAL MENISECTOMY Right 04/05/2016  Procedure: RIGHT KNEE ARTHROSCOPY WITH PARTIAL  LATERAL MENISECTOMY, DEBRIDEMENT MEDIAL MENISCUS, ACL DEBRIDEMENT, MICRO FRACTURE OF FEMUR;  Surgeon: Taft FORBES Minerva, MD;  Location: [facility];  Service: Orthopedics;  Laterality: Right;   KNEE SURGERY     left   KYPHOPLASTY     LAPAROSCOPIC PARTIAL COLECTOMY N/A 11/02/2021   Procedure: LAPAROSCOPIC EXTENDED RIGHT HEMICOLECTOMY;  Surgeon: Kallie Manuelita BROCKS, MD;  Location: [facility];  Service: General;  Laterality: N/A;   POLYPECTOMY  09/28/2021   Procedure: POLYPECTOMY;  Surgeon: Shaaron Lamar HERO, MD;  [facility]: [facility];  Service: Endoscopy;;   TONSILLECTOMY     TOTAL KNEE ARTHROPLASTY Right 02/08/2021    Procedure: TOTAL KNEE ARTHROPLASTY;  Surgeon: Minerva Taft FORBES, MD;  Location: [facility];  Service: Orthopedics;  Laterality: Right;       Home Medications    Prior to Admission medications  Medication Sig Start Date End Date Taking? Authorizing Provider  magnesium (MAGTAB) 84 MG ( ) TBCR SR tablet Take 84 mg by mouth.   Yes [provider]  predniSONE  (DELTASONE ) 20 MG tablet Take 2 tablets (40 mg total) by mouth daily with breakfast for 5 days. 03/20/24 03/25/24 Yes Leath-Warren, Etta PARAS, NP  albuterol  (VENTOLIN  HFA) 108 (90 Base) MCG/ACT inhaler INHALE ONE PUFF INTO THE LUNGS FOUR TIMES DAILY AS NEEDED 01/08/24   Bacchus, Meade PEDLAR, FNP  aspirin  325 MG tablet Take 325 mg by mouth daily.    [provider]  atorvastatin  (LIPITOR) 40 MG tablet Take 1 tablet (40 mg total) by mouth at bedtime. 03/07/24   Bacchus, Gloria Z, FNP  Calcium  Citrate-Vitamin D  (CALCIUM  + D PO) Take 2 tablets by mouth daily.    [provider]  diclofenac  (VOLTAREN ) 75 MG EC tablet TAKE ONE TABLET (75MG  TOTAL) BY MOUTH TWO TIMES DAILY 02/25/24   Bacchus, Meade PEDLAR, FNP  DULoxetine  (CYMBALTA ) 60 MG capsule Take 1 capsule (60 mg total) by mouth 2 (two) times daily. 01/09/24   Bacchus, Meade PEDLAR, FNP  fluticasone -salmeterol (ADVAIR) 250-50 MCG/ACT AEPB Inhale 1 puff into the lungs in the morning and at bedtime. 10/11/22   Sood, Vineet, MD  Misc Natural Products (GLUCOSAMINE CHOND DOUBLE STR PO) Take 2 tablets by mouth daily.    [provider]  Multiple Vitamin (MULTIVITAMIN) tablet Take 1 tablet by mouth daily. Men's 50+    [provider]  pyridOXINE (VITAMIN B6) 50 MG tablet Take 1 tablet (50 mg total) by mouth daily. 08/30/23   Bacchus, Gloria Z, FNP  Vitamin D , Ergocalciferol , (DRISDOL ) 1.25 MG (50000 UNIT) CAPS capsule Take 1 capsule (50,000 Units total) by mouth every 7 (seven) days. 09/07/23   Bacchus, Meade PEDLAR, FNP    Family History Family History  Problem Relation Age  of Onset   Suicidality Father    Colon cancer Neg Hx    Liver disease Neg Hx    Inflammatory bowel disease Neg Hx    Anesthesia problems Neg Hx     Social History Social History[1]   Allergies   Sulfa antibiotics   Review of Systems Review of Systems Per HPI  Physical Exam Triage Vital Signs ED Triage Vitals  Encounter Vitals Group     BP 03/20/24 1658 (!) 141/85     Girls Systolic BP Percentile --      Girls Diastolic BP Percentile --      Boys Systolic BP Percentile --      Boys Diastolic BP Percentile --      Pulse  Rate 03/20/24 1658 72     Resp 03/20/24 1658 18     Temp 03/20/24 1658 97.8 F (36.6 C)     Temp Source 03/20/24 1658 Oral     SpO2 03/20/24 1658 94 %     Weight --      Height --      Head Circumference --      Peak Flow --      Pain Score 03/20/24 1659 6     Pain Loc --      Pain Education --      Exclude from Growth Chart --    No data found.  Updated Vital Signs BP (!) 141/85 (BP Location: Right Arm)   Pulse 72   Temp 97.8 F (36.6 C) (Oral)   Resp 18   SpO2 94%   Visual Acuity Right Eye Distance:   Left Eye Distance:   Bilateral Distance:    Right Eye Near:   Left Eye Near:    Bilateral Near:     Physical Exam Vitals and nursing note reviewed.  Constitutional:      General: He is not in acute distress.    Appearance: Normal appearance.  HENT:     Head: Normocephalic.  Eyes:     Extraocular Movements: Extraocular movements intact.     Pupils: Pupils are equal, round, and reactive to light.  Cardiovascular:     Rate and Rhythm: Normal rate and regular rhythm.     Pulses: Normal pulses.     Heart sounds: Normal heart sounds.  Pulmonary:     Effort: Pulmonary effort is normal.     Breath sounds: Normal breath sounds.  Musculoskeletal:     Right shoulder: Tenderness (Generalized tenderness noted to the supraspinatus muscle of the right shoulder.) present. No swelling or deformity. Normal range of motion. Normal strength.  Normal pulse.     Cervical back: Normal range of motion.  Skin:    General: Skin is warm and dry.  Neurological:     General: No focal deficit present.     Mental Status: He is alert and oriented to person, place, and time.  Psychiatric:        Mood and Affect: Mood normal.        Behavior: Behavior normal.      UC Treatments / Results  Labs (all labs ordered are listed, but only abnormal results are displayed) Labs Reviewed - No data to display  EKG   Radiology No results found.  Procedures Procedures (including critical care time)  Medications Ordered in UC Medications  dexamethasone  (DECADRON ) injection 10 mg (10 mg Intramuscular Given 03/20/24 1731)  ketorolac  (TORADOL ) 30 MG/ML injection 30 mg (30 mg Intramuscular Given 03/20/24 1731)    Initial Impression / Assessment and Plan / UC Course  I have reviewed the triage vital signs and the nursing notes.  Pertinent labs & imaging results that were available during my care of the patient were reviewed by me and considered in my medical decision making (see chart for details).  Patient presents with a 2-week history of right shoulder pain.  On exam, he does have good range of motion.  He does have tenderness to the supraspinatus muscle of the right shoulder.  Patient has not experienced any obvious injury or trauma.  He does have underlying history of osteoarthritis.  Will forego imaging today as patient has not had any injury.  Will treat for inflammation and osteoarthritis with Decadron  10 mg IM and  Toradol  30 mg IM (Per review of the chart, most recent lab work dated 02/05/2024 shows creatinine 0.88, with GFR greater than 60, patient denies history of kidney disease).  Will start patient on prednisone  40 mg for the next 5 days.  He states that he does have Voltaren  gel at home that he will apply topically as needed.  Supportive care recommendations were provided and discussed with the patient to include gentle range of motion  exercises, and the use of ice or heat.  Patient was advised if symptoms fail to improve with this treatment, recommend follow-up with orthopedics for further evaluation.  Patient was in agreement with this plan of care and verbalizes understanding.  All questions were answered.  Patient stable for discharge.   Final Clinical Impressions(s) / UC Diagnoses   Final diagnoses:  Right shoulder pain, unspecified chronicity  History of osteoarthritis     Discharge Instructions      You were given injections of Decadron  10 mg and Toradol  30 mg.  Do not take any additional NSAIDs today to include ibuprofen, Aleve , naproxen , Motrin, Advil, or diclofenac .  You may take arthritis strength Tylenol  for breakthrough pain. Start the prednisone  tomorrow.  While you are taking the prednisone , continue Tylenol  for pain or discomfort.  You may resume diclofenac  once you complete the prednisone . Recommend using the topical diclofenac  cream that you have at home to apply to the shoulder as needed for pain or discomfort. Gentle range of motion exercises while symptoms persist, try to perform exercises at least 2-3 times daily. Recommend the use of ice or heat.  Apply ice for pain or swelling, heat for spasm or stiffness.  Apply for 20 minutes, remove for 1 hour, repeat as needed while symptoms persist. As discussed, if your symptoms fail to improve with this treatment, recommend follow-up with orthopedics for further evaluation. Follow-up as needed.     ED Prescriptions     Medication Sig Dispense Auth. Provider   predniSONE  (DELTASONE ) 20 MG tablet Take 2 tablets (40 mg total) by mouth daily with breakfast for 5 days. 10 tablet Leath-Warren, Etta PARAS, NP      PDMP not reviewed this encounter.    [1]  Social History Tobacco Use   Smoking status: Former    Current packs/day: 0.00    Average packs/day: 0.5 packs/day for 20.0 years (10.0 ttl pk-yrs)    Types: Pipe, Cigarettes    Start date:  04/03/1970    Quit date: 04/03/1990    Years since quitting: 33.9   Smokeless tobacco: Never   Tobacco comments:    quit many years ago  Vaping Use   Vaping status: Never Used  Substance Use Topics   Alcohol use: Not Currently    Comment: occassional   Drug use: No     Gilmer Etta PARAS, NP 03/20/24 1743  "

## 2024-03-26 ENCOUNTER — Encounter: Payer: Self-pay | Admitting: Internal Medicine

## 2024-04-09 ENCOUNTER — Ambulatory Visit: Admitting: Emergency Medicine

## 2024-04-17 ENCOUNTER — Ambulatory Visit: Admitting: Orthopedic Surgery

## 2024-08-04 ENCOUNTER — Inpatient Hospital Stay

## 2024-08-11 ENCOUNTER — Inpatient Hospital Stay: Admitting: Physician Assistant

## 2024-09-01 ENCOUNTER — Encounter: Admitting: Family Medicine
